# Patient Record
Sex: Male | Born: 1955 | Race: Black or African American | Hispanic: No | Marital: Married | Smoking: Never smoker
Health system: Southern US, Community
[De-identification: ages and names within clinical notes are randomized; demographics above are authoritative.]

## PROBLEM LIST (undated history)

## (undated) DIAGNOSIS — S43202A Unspecified subluxation of left sternoclavicular joint, initial encounter: Secondary | ICD-10-CM

## (undated) DIAGNOSIS — J189 Pneumonia, unspecified organism: Secondary | ICD-10-CM

## (undated) DIAGNOSIS — E119 Type 2 diabetes mellitus without complications: Secondary | ICD-10-CM

## (undated) HISTORY — DX: Unspecified subluxation of left sternoclavicular joint, initial encounter: S43.202A

## (undated) HISTORY — DX: Type 2 diabetes mellitus without complications: E11.9

## (undated) HISTORY — DX: Pneumonia, unspecified organism: J18.9

---

## 1998-06-17 ENCOUNTER — Encounter: Admission: RE | Admit: 1998-06-17 | Discharge: 1998-06-17 | Payer: Self-pay | Admitting: *Deleted
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
## 2011-02-17 ENCOUNTER — Emergency Department (HOSPITAL_COMMUNITY)
Admission: EM | Admit: 2011-02-17 | Discharge: 2011-02-18 | Disposition: A | Discharge status: Home / Self Care | Payer: No Typology Code available for payment source | Attending: Emergency Medicine | Admitting: Emergency Medicine

## 2011-02-17 ENCOUNTER — Emergency Department (HOSPITAL_COMMUNITY): Payer: No Typology Code available for payment source

## 2011-02-17 DIAGNOSIS — M542 Cervicalgia: Secondary | ICD-10-CM | POA: Insufficient documentation

## 2011-02-17 DIAGNOSIS — M25539 Pain in unspecified wrist: Secondary | ICD-10-CM | POA: Insufficient documentation

## 2011-02-17 DIAGNOSIS — IMO0002 Reserved for concepts with insufficient information to code with codable children: Secondary | ICD-10-CM | POA: Insufficient documentation

## 2011-02-17 DIAGNOSIS — S139XXA Sprain of joints and ligaments of unspecified parts of neck, initial encounter: Secondary | ICD-10-CM | POA: Insufficient documentation

## 2011-02-17 DIAGNOSIS — E119 Type 2 diabetes mellitus without complications: Secondary | ICD-10-CM | POA: Insufficient documentation

## 2011-02-18 ENCOUNTER — Emergency Department (HOSPITAL_COMMUNITY): Payer: No Typology Code available for payment source
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
## 2011-08-24 ENCOUNTER — Encounter: Payer: Self-pay | Admitting: Emergency Medicine

## 2011-08-24 ENCOUNTER — Emergency Department (HOSPITAL_COMMUNITY)
Admission: EM | Admit: 2011-08-24 | Discharge: 2011-08-24 | Disposition: A | Discharge status: Home / Self Care | Payer: No Typology Code available for payment source | Attending: Emergency Medicine | Admitting: Emergency Medicine

## 2011-08-24 ENCOUNTER — Emergency Department (HOSPITAL_COMMUNITY): Payer: No Typology Code available for payment source

## 2011-08-24 DIAGNOSIS — M549 Dorsalgia, unspecified: Secondary | ICD-10-CM

## 2011-08-24 DIAGNOSIS — M542 Cervicalgia: Secondary | ICD-10-CM

## 2011-08-24 DIAGNOSIS — M545 Low back pain, unspecified: Secondary | ICD-10-CM | POA: Insufficient documentation
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
## 2011-08-24 MED ORDER — HYDROMORPHONE HCL PF 2 MG/ML IJ SOLN
2.0000 mg | Freq: Once | INTRAMUSCULAR | Status: DC
  Filled 2011-08-24: qty 1

## 2011-08-24 MED ORDER — IBUPROFEN 800 MG PO TABS: 800.0000 mg | ORAL_TABLET | Freq: Three times a day (TID) | ORAL | Status: AC

## 2011-08-24 NOTE — ED Notes (Signed)
Ambulatory to check out desk stable and in no acute distress at time of discharge.
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
## 2011-08-24 NOTE — ED Notes (Signed)
MD at bedside. 
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
## 2011-08-26 NOTE — ED Provider Notes (Signed)
I saw and evaluated the patient, reviewed the resident's note and I agree with the findings and plan except as noted. C spine not cleared by Nexus criteria 2/2 midline ttp C 2/3. Neurovascularly intact. Imaging negative for fracture. Home with supportive care.   Forbes Cellar, MD 08/26/11 2250

## 2011-11-05 ENCOUNTER — Encounter: Payer: Self-pay | Admitting: Gastroenterology

## 2012-02-07 ENCOUNTER — Telehealth: Payer: Self-pay

## 2012-02-07 NOTE — Telephone Encounter (Signed)
Pt wants a copy of him immunization records call [phone] when ready

## 2012-02-08 NOTE — Telephone Encounter (Signed)
Printed out phone message due to patient only having a paper chart. °

## 2012-08-03 ENCOUNTER — Ambulatory Visit (INDEPENDENT_AMBULATORY_CARE_PROVIDER_SITE_OTHER): Payer: BC Managed Care – PPO | Admitting: Internal Medicine

## 2012-08-03 VITALS — BP 115/76 | HR 65 | Temp 97.6°F | Resp 18 | Ht 66.0 in | Wt 181.8 lb

## 2012-08-03 DIAGNOSIS — R7302 Impaired glucose tolerance (oral): Secondary | ICD-10-CM | POA: Insufficient documentation

## 2012-08-03 DIAGNOSIS — D72819 Decreased white blood cell count, unspecified: Secondary | ICD-10-CM | POA: Insufficient documentation

## 2012-08-03 DIAGNOSIS — Z0000 Encounter for general adult medical examination without abnormal findings: Secondary | ICD-10-CM

## 2012-08-03 DIAGNOSIS — R7309 Other abnormal glucose: Secondary | ICD-10-CM

## 2012-08-03 DIAGNOSIS — D696 Thrombocytopenia, unspecified: Secondary | ICD-10-CM | POA: Insufficient documentation

## 2012-08-03 LAB — COMPREHENSIVE METABOLIC PANEL
AST: 21 U/L (ref 0–37)
Albumin: 4.7 g/dL (ref 3.5–5.2)
BUN: 13 mg/dL (ref 6–23)
CO2: 26 mEq/L (ref 19–32)
Calcium: 9.3 mg/dL (ref 8.4–10.5)
Chloride: 106 mEq/L (ref 96–112)
Creat: 0.9 mg/dL (ref 0.50–1.35)
Glucose, Bld: 138 mg/dL — ABNORMAL HIGH (ref 70–99)
Potassium: 4.1 mEq/L (ref 3.5–5.3)

## 2012-08-03 LAB — POCT CBC
HCT, POC: 52 % (ref 43.5–53.7)
Hemoglobin: 16.1 g/dL (ref 14.1–18.1)
Lymph, poc: 1.7 (ref 0.6–3.4)
MCH, POC: 28.2 pg (ref 27–31.2)
MCHC: 31 g/dL — AB (ref 31.8–35.4)
MCV: 91 fL (ref 80–97)
POC MID %: 7.6 %M (ref 0–12)
RBC: 5.71 M/uL (ref 4.69–6.13)
WBC: 3.6 10*3/uL — AB (ref 4.6–10.2)

## 2012-08-03 LAB — POCT GLYCOSYLATED HEMOGLOBIN (HGB A1C): Hemoglobin A1C: 6.6

## 2012-08-03 LAB — LIPID PANEL
Cholesterol: 149 mg/dL (ref 0–200)
HDL: 41 mg/dL (ref >39)
Total CHOL/HDL Ratio: 3.6 Ratio

## 2012-08-03 LAB — PSA: PSA: 0.55 ng/mL (ref <=4.00)

## 2012-08-03 NOTE — Progress Notes (Signed)
  Subjective:    Patient ID: David Strong, male    DOB: 1956-01-20, 56 y.o.   MRN: [id number]  HPIannual exam Doing well Business successful Son in college Daughter married with child  Gained 10 lbs  tdap 04 Hep c neg 05 +anti HBs Colonoscopy missed due to work Review of Systems  Constitutional: Negative for fever, activity change, appetite change, fatigue and unexpected weight change.  HENT: Negative for hearing loss, trouble swallowing, neck pain, dental problem and voice change.   Eyes: Negative for photophobia and visual disturbance.  Respiratory: Negative for cough, shortness of breath and wheezing.   Cardiovascular: Negative for chest pain, palpitations and leg swelling.  Gastrointestinal: Negative for nausea, vomiting, abdominal pain, diarrhea and constipation.  Genitourinary: Negative for urgency, frequency, decreased urine volume and difficulty urinating.  Musculoskeletal: Negative for myalgias, back pain, joint swelling, arthralgias and gait problem.  Skin: Negative for rash.  Neurological: Negative for dizziness, weakness and headaches.  Hematological: Negative for adenopathy. Does not bruise/bleed easily.  Psychiatric/Behavioral: Negative for confusion, sleep disturbance, dysphoric mood and agitation.       Objective:   Physical Exam Stable VS Skin clear HEENT clear Heart regular without murmurs clicks rubs or gallops Lungs clear Abdomen soft nontender nondistended with no organomegaly or masses Prostate small symmetrical and soft Rectal negative with ihemosure also negative Spine straight/straight leg raise negative Extremities clear/full peripheral pulses Neurological intact Psychological Stable        Results for orders placed in visit on 08/03/12  POCT GLYCOSYLATED HEMOGLOBIN (HGB A1C)      Component Value Range   Hemoglobin A1C 6.6    POCT CBC      Component Value Range   WBC 3.6 (*) 4.6 - 10.2 K/uL   Lymph, poc 1.7  0.6 - 3.4   POC LYMPH  PERCENT 48.3  10 - 50 %L   MID (cbc) 0.3  0 - 0.9   POC MID % 7.6  0 - 12 %M   POC Granulocyte 1.6 (*) 2 - 6.9   Granulocyte percent 44.1  37 - 80 %G   RBC 5.71  4.69 - 6.13 M/uL   Hemoglobin 16.1  14.1 - 18.1 g/dL   HCT, POC 40.9  81.1 - 53.7 %   MCV 91.0  80 - 97 fL   MCH, POC 28.2  27 - 31.2 pg   MCHC 31.0 (*) 31.8 - 35.4 g/dL   RDW, POC 91.4     Platelet Count, POC 145  142 - 424 K/uL   MPV 9.1  0 - 99.8 fL  IFOBT (OCCULT BLOOD)      Component Value Range   IFOBT Negative      Assessment & Plan:  Annual exam 1. Glucose intolerance (impaired glucose tolerance) --hemoglobin A1c continues to be below 7 and he will continue with diet  2. Leucopenia -genetic   3. Thrombocytopenia -normal this year      Routine labs

## 2012-08-05 ENCOUNTER — Encounter: Payer: Self-pay | Admitting: Internal Medicine

## 2013-08-10 ENCOUNTER — Ambulatory Visit (INDEPENDENT_AMBULATORY_CARE_PROVIDER_SITE_OTHER): Payer: No Typology Code available for payment source | Admitting: Internal Medicine

## 2013-08-10 VITALS — BP 156/96 | HR 79 | Temp 98.6°F | Resp 17 | Ht 65.5 in | Wt 178.0 lb

## 2013-08-10 DIAGNOSIS — Z125 Encounter for screening for malignant neoplasm of prostate: Secondary | ICD-10-CM

## 2013-08-10 DIAGNOSIS — R7309 Other abnormal glucose: Secondary | ICD-10-CM

## 2013-08-10 DIAGNOSIS — Z0000 Encounter for general adult medical examination without abnormal findings: Secondary | ICD-10-CM

## 2013-08-10 DIAGNOSIS — R7302 Impaired glucose tolerance (oral): Secondary | ICD-10-CM

## 2013-08-10 LAB — CBC WITH DIFFERENTIAL/PLATELET
Basophils Relative: 0 % (ref 0–1)
Eosinophils Relative: 1 % (ref 0–5)
HCT: 44.3 % (ref 39.0–52.0)
Hemoglobin: 15.3 g/dL (ref 13.0–17.0)
Lymphocytes Relative: 48 % — ABNORMAL HIGH (ref 12–46)
MCHC: 34.5 g/dL (ref 30.0–36.0)
MCV: 83.7 fL (ref 78.0–100.0)
Monocytes Absolute: 0.3 10*3/uL (ref 0.1–1.0)
Monocytes Relative: 8 % (ref 3–12)
Neutro Abs: 1.6 10*3/uL — ABNORMAL LOW (ref 1.7–7.7)
RDW: 14 % (ref 11.5–15.5)

## 2013-08-10 LAB — LIPID PANEL
Cholesterol: 144 mg/dL (ref 0–200)
HDL: 42 mg/dL (ref >39)
Total CHOL/HDL Ratio: 3.4 Ratio
Triglycerides: 92 mg/dL (ref <150)

## 2013-08-10 LAB — COMPREHENSIVE METABOLIC PANEL
Albumin: 4.4 g/dL (ref 3.5–5.2)
BUN: 14 mg/dL (ref 6–23)
Calcium: 9 mg/dL (ref 8.4–10.5)
Chloride: 105 mEq/L (ref 96–112)
Creat: 0.97 mg/dL (ref 0.50–1.35)
Glucose, Bld: 161 mg/dL — ABNORMAL HIGH (ref 70–99)
Potassium: 4.3 mEq/L (ref 3.5–5.3)

## 2013-08-10 NOTE — Progress Notes (Addendum)
Subjective:    Patient ID: David Strong, male    DOB: [birth year], 57 y.o.   MRN: [id number] This chart was scribed for [name], MD by Nicholos Johns, Medical Scribe. This patient's care was started at 11:38 AM.  HPI  HPI Comments: David Strong is a 57 y.o. male who presents for an annual exam.  Pt is well overall. Will be visiting family for the holidays here in [place] relocated from [place].   Pt has not had colonoscopy. States he attempted to schedule a  non-evasive version of the test but was unsuccessful w/ their scheduling.  Pt states he is checking his sugar and trying to keep his weight down. Has lost 10+ lbs over the last year. 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 Son to grad this spring and take over  Patient Active Problem List   Diagnosis Date Noted  . Glucose intolerance (impaired glucose tolerance) 08/03/2012  . Leucopenia 08/03/2012  . Thrombocytopenia 08/03/2012  no meds Home BPs all wnl imm utd   Review of Systems  Constitutional: Negative for fever, activity change, appetite change, fatigue and unexpected weight change.  HENT: Negative for congestion, hearing loss, trouble swallowing and voice change.   Eyes: Negative for visual disturbance.  Respiratory: Negative for cough and shortness of breath.   Cardiovascular: Negative for chest pain, palpitations and leg swelling.  Gastrointestinal: Negative for nausea, abdominal pain and blood in stool.  Genitourinary: Negative for difficulty urinating.  Musculoskeletal: Negative for arthralgias, back pain, gait problem, joint swelling, myalgias and neck pain.  Skin: Negative for rash.  Allergic/Immunologic: Negative for environmental allergies and immunocompromised state.  Neurological: Negative for dizziness, weakness and headaches.  Hematological: Negative for adenopathy. Does not bruise/bleed easily.  Psychiatric/Behavioral: Negative for sleep disturbance and dysphoric mood.         Objective:   Physical Exam  Vitals reviewed. Constitutional: He is oriented to person, place, and time. He appears well-developed and well-nourished.  HENT:  Head: Normocephalic.  Right Ear: External ear normal.  Left Ear: External ear normal.  Nose: Nose normal.  Mouth/Throat: Oropharynx is clear and moist.  Tms and canals clear  Eyes: Conjunctivae and EOM are normal. Pupils are equal, round, and reactive to light.  Neck: Normal range of motion. Neck supple. No thyromegaly present.  Cardiovascular: Normal rate, regular rhythm, normal heart sounds and intact distal pulses.   No murmur heard. Pulmonary/Chest: Effort normal and breath sounds normal. No respiratory distress. He has no wheezes. He has no rales.  Abdominal: Soft. Bowel sounds are normal. He exhibits no distension and no mass. There is no tenderness. There is no rebound and no guarding.  No hepatosplenomegaly  Musculoskeletal: Normal range of motion. He exhibits no edema and no tenderness.  Lymphadenopathy:    He has no cervical adenopathy.  Neurological: He is alert and oriented to person, place, and time. He has normal reflexes. No cranial nerve deficit. He exhibits normal muscle tone. Coordination normal.  Skin: Skin is warm and dry. No rash noted.  Psychiatric: He has a normal mood and affect. His behavior is normal. Judgment and thought content normal.    Filed Vitals:   08/10/13 1122  BP: 156/96-----repeat in room 135/80  Pulse: 79  Temp: 98.6 F (37 C)  TempSrc: Oral  Resp: 17  Height: 5' 5.5" (1.664 m)  Weight: 178 lb (80.74 kg)  SpO2: 97%      Assessment & Plan:  I have completed the patient encounter in its  entirety as documented by the scribe, with editing by me where necessary. Robert P. Merla Riches, M.D.  CPE Imaired Glu Tol--A1C today 6.6%   sch colonoscopy

## 2013-08-18 ENCOUNTER — Encounter: Payer: Self-pay | Admitting: Internal Medicine

## 2014-09-18 ENCOUNTER — Ambulatory Visit (INDEPENDENT_AMBULATORY_CARE_PROVIDER_SITE_OTHER): Payer: 59 | Admitting: Internal Medicine

## 2014-09-18 VITALS — BP 116/70 | HR 64 | Temp 98.3°F | Ht 65.75 in | Wt 179.8 lb

## 2014-09-18 DIAGNOSIS — D72819 Decreased white blood cell count, unspecified: Secondary | ICD-10-CM

## 2014-09-18 DIAGNOSIS — Z0000 Encounter for general adult medical examination without abnormal findings: Secondary | ICD-10-CM

## 2014-09-18 DIAGNOSIS — Z125 Encounter for screening for malignant neoplasm of prostate: Secondary | ICD-10-CM

## 2014-09-18 DIAGNOSIS — R7302 Impaired glucose tolerance (oral): Secondary | ICD-10-CM

## 2014-09-18 DIAGNOSIS — D696 Thrombocytopenia, unspecified: Secondary | ICD-10-CM

## 2014-09-18 LAB — LIPID PANEL
CHOL/HDL RATIO: 4 ratio
CHOLESTEROL: 156 mg/dL (ref 0–200)
HDL: 39 mg/dL — ABNORMAL LOW (ref >39)
LDL CALC: 102 mg/dL — AB (ref 0–99)
TRIGLYCERIDES: 77 mg/dL (ref <150)
VLDL: 15 mg/dL (ref 0–40)

## 2014-09-18 LAB — CBC WITH DIFFERENTIAL/PLATELET
BASOS PCT: 0 % (ref 0–1)
Basophils Absolute: 0 10*3/uL (ref 0.0–0.1)
EOS PCT: 2 % (ref 0–5)
Eosinophils Absolute: 0.1 10*3/uL (ref 0.0–0.7)
HEMATOCRIT: 46 % (ref 39.0–52.0)
Hemoglobin: 15.6 g/dL (ref 13.0–17.0)
Lymphocytes Relative: 51 % — ABNORMAL HIGH (ref 12–46)
Lymphs Abs: 1.7 10*3/uL (ref 0.7–4.0)
MCH: 28.8 pg (ref 26.0–34.0)
MCHC: 33.9 g/dL (ref 30.0–36.0)
MCV: 85 fL (ref 78.0–100.0)
MONOS PCT: 7 % (ref 3–12)
MPV: 11 fL (ref 8.6–12.4)
Monocytes Absolute: 0.2 10*3/uL (ref 0.1–1.0)
NEUTROS PCT: 40 % — AB (ref 43–77)
Neutro Abs: 1.4 10*3/uL — ABNORMAL LOW (ref 1.7–7.7)
PLATELETS: 131 10*3/uL — AB (ref 150–400)
RBC: 5.41 MIL/uL (ref 4.22–5.81)
RDW: 13.5 % (ref 11.5–15.5)
WBC: 3.4 10*3/uL — AB (ref 4.0–10.5)

## 2014-09-18 LAB — COMPREHENSIVE METABOLIC PANEL
ALBUMIN: 4.5 g/dL (ref 3.5–5.2)
ALT: 19 U/L (ref 0–53)
AST: 19 U/L (ref 0–37)
Alkaline Phosphatase: 48 U/L (ref 39–117)
BILIRUBIN TOTAL: 0.8 mg/dL (ref 0.2–1.2)
BUN: 12 mg/dL (ref 6–23)
CO2: 28 mEq/L (ref 19–32)
CREATININE: 0.89 mg/dL (ref 0.50–1.35)
Calcium: 9.4 mg/dL (ref 8.4–10.5)
Chloride: 103 mEq/L (ref 96–112)
Glucose, Bld: 132 mg/dL — ABNORMAL HIGH (ref 70–99)
Potassium: 4.2 mEq/L (ref 3.5–5.3)
SODIUM: 139 meq/L (ref 135–145)
TOTAL PROTEIN: 6.8 g/dL (ref 6.0–8.3)

## 2014-09-18 LAB — POCT GLYCOSYLATED HEMOGLOBIN (HGB A1C): HEMOGLOBIN A1C: 7.5

## 2014-09-18 NOTE — Patient Instructions (Signed)
Look into these 2 diabetes medications Glipizide and Glucotrol. Also look at food choices and portions.   Wt Readings from Last 3 Encounters:  09/18/14 179 lb 12.8 oz (81.557 kg)  08/10/13 178 lb (80.74 kg)  08/03/12 181 lb 12.8 oz (82.464 kg)

## 2014-09-18 NOTE — Progress Notes (Signed)
Subjective:  This chart was scribed for David Siaobert Doolittle, MD by David Strong, ED Scribe at [facility].The patient was seen in exam room 02 and the patient's care was started at 1:39 PM.   Patient ID: David Strong, male    DOB: 10/09/1955, 59 y.o.   MRN: [id number] Chief Complaint  Patient presents with  . Annual Exam    Fasting   HPI  HPI Comments: David Strong Medinger is a 59 y.o. male with a history of DM who [facility] an annual [facility] exam. Pt's weight has been steady and his blood sugar levels have been variable. He states his blood sugar was elevated recently because of his diet-Gin over last 3 mos..  No problems with fatigue and no issues with sleep.  He has not had a colonoscopy. He is afraid of being put to sleep. Hep c neg 05 +anti HBs  Pt complains of some muscle pain and joint pains, he states this is most likely due to his work. Pt has his own cleaning business.   Immunization History  Administered Date(s) Administered  . Hepatitis A 07/25/2006  . Td 11/28/2005    Patient Active Problem List   Diagnosis Date Noted  . Glucose intolerance (impaired glucose tolerance)--after initially having a terrible skin reaction with metformin he discontinued medicines and was able to control this problem with diet for the last 3-4 years  08/03/2012  . Leucopenia 08/03/2012  . Thrombocytopenia 08/03/2012   Past Medical History  Diagnosis Date  . Diabetes mellitus without complication    No past surgical history on file. No Known Allergies Prior to Admission medications   Not on File  Nonsmoker  Review of Systems  Constitutional: Negative for fatigue.  Musculoskeletal: Positive for myalgias and arthralgias.  Psychiatric/Behavioral: Negative for sleep disturbance.  Remainder of the review of systems is negative     Objective:   Physical Exam  Constitutional: He is oriented to person, place, and time. He appears well-developed and  well-nourished. No distress.  HENT:  Head: Normocephalic and atraumatic.  Right Ear: External ear normal.  Left Ear: External ear normal.  Nose: Nose normal.  Mouth/Throat: Oropharynx is clear and moist.  Tms and canals clear  Eyes: Conjunctivae and EOM are normal. Pupils are equal, round, and reactive to light.  Neck: Normal range of motion. Neck supple. No thyromegaly present.  Cardiovascular: Normal rate, regular rhythm, normal heart sounds and intact distal pulses.   No murmur heard. Pulmonary/Chest: Effort normal and breath sounds normal. No respiratory distress. He has no wheezes. He has no rales.  Abdominal: Soft. Bowel sounds are normal. He exhibits no distension and no mass. There is no tenderness. There is no rebound and no guarding.  No hepatosplenomegaly  Musculoskeletal: Normal range of motion. He exhibits no edema or tenderness.  Lymphadenopathy:    He has no cervical adenopathy.  Neurological: He is alert and oriented to person, place, and time. He has normal reflexes. No cranial nerve deficit. He exhibits normal muscle tone. Coordination normal.  Skin: Skin is warm and dry. No rash noted.  Psychiatric: He has a normal mood and affect. His behavior is normal. Judgment and thought content normal.  Nursing note and vitals reviewed. BP 116/70 mmHg  Pulse 64  Temp(Src) 98.3 F (36.8 C) (Oral)  Ht 5' 5.75" (1.67 m)  Wt 179 lb 12.8 oz (81.557 kg)  BMI 29.24 kg/m2  SpO2 98% Results for orders placed or performed in visit  on 09/18/14  POCT glycosylated hemoglobin (Hb A1C)  Result Value Ref Range   Hemoglobin A1C 7.5          Assessment & Plan:  Glucose intolerance (impaired glucose tolerance) /diabetes mellitus type 2 without complications - Plan:  Comprehensive metabolic panel, Lipid panel  Allie went over stict diet at great length  Stop etoh  Increase exer and hope to lose 10 pounds  Leucopenia -with thrombocytopenia - Plan: CBC with  Differential/Platelet  Screening for prostate cancer - Plan: PSA  Annual physical exam -he continues to look very healthy from an exam standpoint  Screening for colon cancer   Plan: IFOBT POC (occult bld, rslt in office)      I have completed the patient encounter in its entirety as documented by the scribe, with editing by me where necessary. Robert P. Merla Riches, M.D.

## 2014-09-19 LAB — IFOBT (OCCULT BLOOD): IMMUNOLOGICAL FECAL OCCULT BLOOD TEST: NEGATIVE

## 2014-09-19 NOTE — Addendum Note (Signed)
Addended byLevon Hedger: Jailyne Chieffo A on: 09/19/2014 04:56 PM   Modules accepted: Orders

## 2014-09-20 LAB — PSA: PSA: 0.67 ng/mL (ref <=4.00)

## 2014-09-22 ENCOUNTER — Encounter: Payer: Self-pay | Admitting: Internal Medicine

## 2014-09-29 ENCOUNTER — Telehealth: Payer: Self-pay | Admitting: Radiology

## 2014-09-29 NOTE — Telephone Encounter (Signed)
We will mail him the info- i favor glimeperide/glipizide--he can let us know

## 2014-09-29 NOTE — Telephone Encounter (Signed)
Pt calling about lab results. He is also interested in taking the glimeperide and [place]. He wants more information about this.

## 2014-10-01 NOTE — Telephone Encounter (Signed)
Spoke with pt, advised message from Dr. Doolittle. Pt understood. 

## 2015-04-11 ENCOUNTER — Telehealth: Payer: Self-pay

## 2015-04-11 DIAGNOSIS — H538 Other visual disturbances: Secondary | ICD-10-CM

## 2015-04-11 DIAGNOSIS — R7302 Impaired glucose tolerance (oral): Secondary | ICD-10-CM

## 2015-04-11 NOTE — Telephone Encounter (Signed)
Hughes Better is calling from [facility] to request a referral for the patient. His appointment there is Thursday August 25th. The diagnoses code is H53.8 [facility] phone: [phone]

## 2015-04-11 NOTE — Telephone Encounter (Signed)
Okay for referral?

## 2015-04-11 NOTE — Telephone Encounter (Signed)
done

## 2015-04-14 LAB — HM DIABETES EYE EXAM

## 2015-06-07 ENCOUNTER — Ambulatory Visit (INDEPENDENT_AMBULATORY_CARE_PROVIDER_SITE_OTHER): Payer: 59 | Admitting: Internal Medicine

## 2015-06-07 VITALS — BP 106/68 | HR 108 | Temp 98.3°F | Resp 18 | Ht 67.0 in | Wt 181.0 lb

## 2015-06-07 DIAGNOSIS — K4090 Unilateral inguinal hernia, without obstruction or gangrene, not specified as recurrent: Secondary | ICD-10-CM

## 2015-06-07 DIAGNOSIS — E119 Type 2 diabetes mellitus without complications: Secondary | ICD-10-CM

## 2015-06-07 DIAGNOSIS — R7302 Impaired glucose tolerance (oral): Secondary | ICD-10-CM

## 2015-06-07 LAB — HEMOGLOBIN A1C: HEMOGLOBIN A1C: 8.2 % — AB (ref 4.0–6.0)

## 2015-06-07 LAB — POCT GLYCOSYLATED HEMOGLOBIN (HGB A1C): HEMOGLOBIN A1C: 8.2

## 2015-06-07 MED ORDER — GLIPIZIDE ER 5 MG PO TB24: 5.0000 mg | ORAL_TABLET | Freq: Every day | ORAL | Status: DC

## 2015-06-07 NOTE — Progress Notes (Signed)
Subjective:  This chart was scribed for David Sia, MD by Stann Ore, Medical Scribe. This patient was seen in Room 10 and the patient's care was started at 5:46 PM.     Patient ID: David Strong, male    DOB: [birth year], 59 y.o.   MRN: [id number] Chief Complaint  Patient presents with  . Mass    in groin area, right sided, x 1 month    HPI David Strong is a 59 y.o. male who has h/o DM contr by diet, presents to [facility] complaining of a mass in the right side of the groin noticed about a month ago. When he lays supine, the mass would fade out. He notices when he coughs, it reappears. He denies any pain in the area and testicular pain. He also has a mass on his chest, under his neck. He denies of any chest pain, shortness of breath, and pain in that area.  He has physical work.  Patient Active Problem List   Diagnosis Date Noted  . Glucose intolerance (impaired glucose tolerance)---side effects from metformin/trying for diet control doing well-last a1c 1/16 7.5 08/03/2012  . Leucopenia 08/03/2012  . Thrombocytopenia (HCC) 08/03/2012      Pt has his own cleaning business.  He's getting married again soon-young woman from [place] coming-known 39yrs-may start a 2nd family.   Patient Active Problem List   Diagnosis Date Noted  . Glucose intolerance (impaired glucose tolerance) 08/03/2012  . Leucopenia 08/03/2012  . Thrombocytopenia (HCC) 08/03/2012   No current outpatient prescriptions on file.   Review of Systems  Constitutional: Negative for fever, chills and fatigue.  Respiratory: Negative for cough, chest tightness and shortness of breath.   Cardiovascular: Negative for chest pain.  Gastrointestinal: Negative for abdominal pain.       Mass noted in groin  Genitourinary: Negative for penile swelling, scrotal swelling, penile pain and testicular pain.  Musculoskeletal: Negative for arthralgias.       Objective:   Physical Exam  Constitutional: He is oriented to  person, place, and time. He appears well-developed and well-nourished. No distress.  HENT:  Head: Normocephalic and atraumatic.  Eyes: EOM are normal. Pupils are equal, round, and reactive to light.  Neck: Neck supple.  Cardiovascular: Normal rate.   Pulmonary/Chest: Effort normal. No respiratory distress.  Abdominal:  Large soft bulge in inguinal area above ligament that leaves 3cm defect in abd wall  Musculoskeletal: Normal range of motion.  Neurological: He is alert and oriented to person, place, and time.  Skin: Skin is warm and dry.  Psychiatric: He has a normal mood and affect. His behavior is normal.  Nursing note and vitals reviewed.   BP 106/68 mmHg  Pulse 108  Temp(Src) 98.3 F (36.8 C)  Resp 18  Ht  (1.702 m)  Wt 181 lb (82.101 kg)  BMI 28.34 kg/m2  SpO2 98% Results for orders placed or performed in visit on 06/07/15  POCT glycosylated hemoglobin (Hb A1C)  Result Value Ref Range   Hemoglobin A1C 8.2         Assessment & Plan:  1) Glucose intolerance (impaired glucose tolerance) -  Type 2 diabetes mellitus without complication, without long-term current use of insulin (HCC) --We'll need to start medication other than metformin Meds ordered this encounter  Medications  . glipiZIDE (GLUCOTROL XL) 5 MG 24 hr tablet    Sig: Take 1 tablet (5 mg total) by mouth daily with breakfast.    Dispense:  90 tablet  Refill:  1  f/u 3 mos/contin wt loss efforts   2) Direct inguinal hernia--ref to surgery    By signing my name below, I, Stann Oresung-Kai Tsai, attest that this documentation has been prepared under the direction and in the presence of David Siaobert Daneli Butkiewicz, MD. Electronically Signed: Stann Oresung-Kai Tsai, Scribe. 06/07/2015 , 5:46 PM .  I have completed the patient encounter in its entirety as documented by the scribe, with editing by me where necessary. Kewon Statler P. Merla Richesoolittle, M.D.

## 2015-06-17 ENCOUNTER — Encounter: Payer: Self-pay | Admitting: Family Medicine

## 2015-06-21 ENCOUNTER — Telehealth: Payer: Self-pay

## 2015-06-21 NOTE — Telephone Encounter (Signed)
Any input to tell pt?

## 2015-06-21 NOTE — Telephone Encounter (Signed)
Patient is calling because he was last seen for a hernia and was sent to a specialist. Patient states that Dr. Merla Richesoolittle may have been contacted by the specialist and he would like to talk to Dr. Merla Richesoolittle to get more input. Please call patient!  [phone]

## 2015-06-22 NOTE — Telephone Encounter (Signed)
Tell him there is no way to know what caused the hernia--it could have been a week spot in the muscle or it could have been a tear from lifting something too heavy  The surgeon may offer another answer but i think it best to use regular insurance

## 2015-06-22 NOTE — Telephone Encounter (Signed)
Please advise 

## 2015-06-22 NOTE — Telephone Encounter (Signed)
Pt is filling out insurance paperwork concerning this visit. Dr. Merla Richesoolittle was wanting to know what may have caused this-he thinks it is his job work, he lifts a lot at his job. He wants to be on same page as Dr. Yong Channelolittle when he fills out his paperwork. CB #  Same as below

## 2015-06-23 NOTE — Telephone Encounter (Signed)
Unable  To reach Pt. Left VM to call back  Please see previous message

## 2015-06-23 NOTE — Telephone Encounter (Signed)
Spoke with Pt. Advised him of Dr. Netta Corriganoolittle's message. He said he would call back if he had any issues with his insurance company.

## 2015-07-02 ENCOUNTER — Telehealth: Payer: Self-pay

## 2015-07-02 NOTE — Telephone Encounter (Signed)
Pt called in and needs to discuss with Dr Merla Richesoolittle about his glipiZIDE (GLUCOTROL XL) 5 MG . He states is bs is low. He can be reached @ [phone], thank you

## 2015-07-04 NOTE — Telephone Encounter (Signed)
Should he continue the glipizide? He states he does not take the Metformin. He checked his blood sugar yesterday and it was at 104. I am confused on what pt is really wanting to do. It seems he does not want to take the Glipizide. Please advise.

## 2015-07-04 NOTE — Telephone Encounter (Signed)
We hope his BS in am is between 80 and 120 before he eats He is not on metformin He is prescribed glipizide 5mg  and needs this because A1C over 8 He can be on 2.5mg  glipizide if he has low blood sugars(lower than 80 at any time during the day) but 104 is great value

## 2015-07-04 NOTE — Telephone Encounter (Signed)
Spoke with pt, he states his sugar is 104 and he thinks this is too low.

## 2015-07-05 NOTE — Telephone Encounter (Signed)
Advised pt message from dr. Merla Richesoolittle. Pt understood.

## 2015-07-05 NOTE — Telephone Encounter (Signed)
Left message for pt to call back  °

## 2015-07-18 ENCOUNTER — Encounter: Payer: Self-pay | Admitting: Internal Medicine

## 2015-12-07 ENCOUNTER — Telehealth: Payer: Self-pay

## 2015-12-07 MED ORDER — METFORMIN HCL ER (MOD) 500 MG PO TB24: 500.0000 mg | ORAL_TABLET | Freq: Every day | ORAL | Status: DC

## 2015-12-07 NOTE — Telephone Encounter (Signed)
Ok to change back to metf 500er tho he was off this 2013-14-15 due to good diet Should come in for A1C in early July to see if this is good enough Meds ordered this encounter  Medications  . metFORMIN (GLUMETZA) 500 MG (MOD) 24 hr tablet    Sig: Take 1 tablet (500 mg total) by mouth daily with breakfast.    Dispense:  30 tablet    Refill:  2

## 2015-12-07 NOTE — Telephone Encounter (Signed)
Pt believes to be having side effect of the glipizide, states he just doesn't fill right wants to go back to the white pill  Best number [phone]

## 2015-12-08 NOTE — Telephone Encounter (Signed)
Pt advised.

## 2015-12-13 ENCOUNTER — Telehealth: Payer: Self-pay

## 2015-12-13 MED ORDER — METFORMIN HCL ER 500 MG PO TB24: 500.0000 mg | ORAL_TABLET | Freq: Every day | ORAL | Status: DC

## 2015-12-13 NOTE — Telephone Encounter (Signed)
Pharm sent notice that metformin er 500mg  (modified tabs) need a PA. This is a very expensive variety of metformin er and I don't see any mention in notes that pt needs this form. Dr Merla Richesoolittle just mentioned that he was putting pt back on metformin ER 500. It looks like Dr Merla Richesoolittle just chose the first ER tab that came up in EPIC and didn't realize it was a special form of tablet. I will resent the regular tabs of this med that is covered by ins. Dr Merla Richesoolittle, Lorain ChildesFYI.

## 2015-12-15 NOTE — Telephone Encounter (Signed)
Thanks

## 2016-01-17 ENCOUNTER — Ambulatory Visit (INDEPENDENT_AMBULATORY_CARE_PROVIDER_SITE_OTHER): Payer: BLUE CROSS/BLUE SHIELD | Admitting: Internal Medicine

## 2016-01-17 VITALS — BP 122/72 | HR 76 | Temp 98.1°F | Resp 17 | Ht 66.0 in | Wt 186.0 lb

## 2016-01-17 DIAGNOSIS — E119 Type 2 diabetes mellitus without complications: Secondary | ICD-10-CM | POA: Diagnosis not present

## 2016-01-17 LAB — POCT GLYCOSYLATED HEMOGLOBIN (HGB A1C): HEMOGLOBIN A1C: 7

## 2016-01-17 MED ORDER — GLUCOSE BLOOD VI STRP: ORAL_STRIP | Status: DC

## 2016-01-17 MED ORDER — GLIPIZIDE ER 5 MG PO TB24: 5.0000 mg | ORAL_TABLET | Freq: Every day | ORAL | Status: DC

## 2016-01-17 NOTE — Progress Notes (Signed)
Subjective:  By signing my name below, I, Stann Oresung-Kai Tsai, attest that this documentation has been prepared under the direction and in the presence of [name], MD. Electronically Signed: Stann Oresung-Kai Tsai, Scribe. 01/17/2016 , 5:51 PM .  Patient was seen in Room 3 .   Patient ID: David Strong, male    DOB: 01/07/1956, 60 y.o.   MRN: [id number] Chief Complaint  Patient presents with  . Medication Refill    glipizide    HPI David CavaSam Bragdon is a 60 y.o. male who [facility] medication refill of his glipizide. His blood sugar has been running well until 12/18/15. His machine ran out of "accu chek aviva plus" test strips and need a new order. When he goes to the pharmacy requesting new strips, they informed him needing a doctor's note for it.  He had to restart medication in the fall due to elevated A1c. 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 Patient Active Problem List   Diagnosis Date Noted  . Type 2 diabetes mellitus (HCC) 06/07/2015  . Glucose intolerance (impaired glucose tolerance) 08/03/2012  . Leucopenia 08/03/2012  . Thrombocytopenia (HCC) 08/03/2012    Current outpatient prescriptions:  .  glipiZIDE (GLUCOTROL) 10 MG tablet, Take 10 mg by mouth daily before breakfast., Disp: , Rfl:  Allergies  Allergen Reactions  . Codeine Itching  . Metformin And Related Rash   Review of Systems  Constitutional: Negative for fatigue and unexpected weight change.  Eyes: Negative for visual disturbance.  Respiratory: Negative for cough, chest tightness and shortness of breath.   Cardiovascular: Negative for chest pain, palpitations and leg swelling.  Gastrointestinal: Negative for abdominal pain and blood in stool.  Neurological: Negative for dizziness, light-headedness and headaches.         Objective:   Physical Exam  Constitutional: He is oriented to person, place, and time. He appears well-developed and well-nourished. No distress.  HENT:  Head: Normocephalic and atraumatic.  Eyes: EOM are normal. Pupils are equal, round, and reactive to light.  Neck: Neck supple.  Cardiovascular: Normal rate.   Pulmonary/Chest: Effort normal. No respiratory distress.  Musculoskeletal: Normal range of motion.  Neurological: He is alert and oriented to person, place, and time.  Skin: Skin is warm and dry.  Psychiatric: He has a normal mood and affect. His behavior is normal.  Nursing note and vitals reviewed.   BP 122/72 mmHg  Pulse 76  Temp(Src) 98.1 F (36.7 C) (Oral)  Resp 17  Ht 5\' 6"  (1.676 m)  Wt 186 lb (84.369 kg)  BMI 30.04 kg/m2  SpO2 99%   Results for orders placed or performed in visit on 01/17/16  POCT glycosylated hemoglobin (Hb A1C)  Result Value Ref Range   Hemoglobin A1C 7.0       Assessment & Plan:  Type 2 diabetes mellitus without complication, without long-term current use of insulin (HCC) - Plan: POCT glycosylated hemoglobin (Hb A1C) --looks like adequate control F/U 6 mos for CPE/full labs etc w/ Mannie StabileM Clark PA-C Meds ordered this encounter  Medications  . glucose blood (ACCU-CHEK AVIVA PLUS) test strip    Sig: Use as instructed    Dispense:  100 each    Refill:  12  . glipiZIDE (GLUCOTROL XL) 5 MG 24 hr tablet  Sig: Take 1 tablet (5 mg total) by mouth daily with breakfast.    Dispense:  90 tablet    Refill:  1   I have completed the patient encounter in its entirety as documented by the scribe, with editing by me where necessary. Robert P. Merla Riches, M.D.

## 2016-01-17 NOTE — Patient Instructions (Addendum)
     IF you received an x-ray today, you will receive an invoice from [facility]. Please contact [facility] at [phone] with questions or concerns regarding your invoice.   IF you received labwork today, you will receive an invoice from [facility]. Please contact [facility] at [phone] with questions or concerns regarding your invoice.   Our billing staff will not be able to assist you with questions regarding bills from these companies.  You will be contacted with the lab results as soon as they are available. The fastest way to get your results is to activate your My Chart account. Instructions are located on the last page of this paperwork. If you have not heard from us regarding the results in 2 weeks, please contact this office.     followup for physical in 6 mos--full bloodwork etc with PA-C Deliah BostonMichael Clark

## 2016-01-20 ENCOUNTER — Other Ambulatory Visit: Payer: Self-pay | Admitting: Emergency Medicine

## 2016-01-20 MED ORDER — GLUCOSE BLOOD VI STRP: ORAL_STRIP | Status: DC

## 2016-01-20 MED ORDER — GLIPIZIDE ER 5 MG PO TB24: 5.0000 mg | ORAL_TABLET | Freq: Every day | ORAL | Status: DC

## 2017-04-25 ENCOUNTER — Telehealth: Payer: Self-pay | Admitting: Family Medicine

## 2017-05-06 ENCOUNTER — Encounter: Payer: Self-pay | Admitting: Physician Assistant

## 2017-05-06 ENCOUNTER — Ambulatory Visit (INDEPENDENT_AMBULATORY_CARE_PROVIDER_SITE_OTHER): Payer: BLUE CROSS/BLUE SHIELD | Admitting: Physician Assistant

## 2017-05-06 VITALS — BP 134/96 | HR 65 | Temp 98.4°F | Resp 16 | Ht 65.75 in | Wt 187.8 lb

## 2017-05-06 DIAGNOSIS — Z23 Encounter for immunization: Secondary | ICD-10-CM | POA: Diagnosis not present

## 2017-05-06 DIAGNOSIS — E1165 Type 2 diabetes mellitus with hyperglycemia: Secondary | ICD-10-CM

## 2017-05-06 DIAGNOSIS — Z125 Encounter for screening for malignant neoplasm of prostate: Secondary | ICD-10-CM | POA: Diagnosis not present

## 2017-05-06 DIAGNOSIS — Z1329 Encounter for screening for other suspected endocrine disorder: Secondary | ICD-10-CM

## 2017-05-06 DIAGNOSIS — Z1211 Encounter for screening for malignant neoplasm of colon: Secondary | ICD-10-CM

## 2017-05-06 DIAGNOSIS — Z13228 Encounter for screening for other metabolic disorders: Secondary | ICD-10-CM

## 2017-05-06 DIAGNOSIS — Z1321 Encounter for screening for nutritional disorder: Secondary | ICD-10-CM | POA: Diagnosis not present

## 2017-05-06 DIAGNOSIS — Z130 Encounter for screening for diseases of the blood and blood-forming organs and certain disorders involving the immune mechanism: Secondary | ICD-10-CM

## 2017-05-06 DIAGNOSIS — Z0000 Encounter for general adult medical examination without abnormal findings: Secondary | ICD-10-CM | POA: Diagnosis not present

## 2017-05-06 MED ORDER — GLUCOSE BLOOD VI STRP: ORAL_STRIP | 3 refills | Status: DC

## 2017-05-06 MED ORDER — GLIPIZIDE ER 5 MG PO TB24: 5.0000 mg | ORAL_TABLET | Freq: Every day | ORAL | 3 refills | Status: DC

## 2017-05-06 NOTE — Progress Notes (Signed)
05/06/2017 8:41 AM   DOB: 01/14/1956 / MRN: [id number]  SUBJECTIVE:  David Strong is a 61 y.o. male presenting for annual exam. Behind on multiple screenings.  Has a history of diabetes and has been taking glipizide. Smoked a long time ago.  Takes glipizide for diabetes.  He works in Research scientist (medical) and tells me that he is constantly moving and on his feet.     He is allergic to codeine and metformin and related.   He  has a past medical history of Diabetes mellitus without complication (HCC).    He  reports that he has quit smoking. He has never used smokeless tobacco. He reports that he drinks alcohol. He reports that he does not use drugs. He  has no sexual activity history on file. The patient  has no past surgical history on file.  His family history is not on file.  Review of Systems  Constitutional: Negative for chills, diaphoresis and fever.  Eyes: Negative.   Respiratory: Negative for shortness of breath.   Cardiovascular: Negative for chest pain, orthopnea and leg swelling.  Gastrointestinal: Negative for abdominal pain, blood in stool, constipation, diarrhea, heartburn, melena, nausea and vomiting.  Genitourinary: Negative for flank pain.  Skin: Negative for rash.  Neurological: Negative for dizziness, sensory change, speech change, focal weakness and headaches.    The problem list and medications were reviewed and updated by myself where necessary and exist elsewhere in the encounter.   OBJECTIVE:  BP (!) 134/96 (BP Location: Right Arm, Patient Position: Sitting, Cuff Size: Large)   Pulse 65   Temp 98.4 F (36.9 C) (Oral)   Resp 16   Ht 5' 5.75" (1.67 m)   Wt 187 lb 12.8 oz (85.2 kg)   SpO2 96%   BMI 30.54 kg/m   Physical Exam  Constitutional: He is oriented to person, place, and time. He appears well-developed. He is active and cooperative.  Non-toxic appearance.  Eyes: Pupils are equal, round, and reactive to light. EOM are normal.  Cardiovascular:  Normal rate, regular rhythm, S1 normal, S2 normal, normal heart sounds, intact distal pulses and normal pulses.  Exam reveals no gallop and no friction rub.   No murmur heard. Pulmonary/Chest: Effort normal. No stridor. No tachypnea. No respiratory distress. He has no wheezes. He has no rales.  Abdominal: He exhibits no distension.  Musculoskeletal: He exhibits no edema.  Neurological: He is alert and oriented to person, place, and time. He has normal strength and normal reflexes. He is not disoriented. No cranial nerve deficit or sensory deficit. He exhibits normal muscle tone. Coordination and gait normal.  Skin: Skin is warm and dry. He is not diaphoretic. No pallor.  Psychiatric: His behavior is normal.  Vitals reviewed.   No results found for this or any previous visit (from the past 72 hour(s)).  No results found.  Lab Results  Component Value Date   PSA 0.67 09/18/2014   PSA 0.66 08/10/2013   PSA 0.55 08/03/2012     ASSESSMENT AND PLAN:  [name] was seen today for annual exam and medication refill.  Diagnoses and all orders for this visit:  Annual physical exam  Screening for endocrine, nutritional, metabolic and immunity disorder -     CBC -     Lipid panel -     TSH -     Microalbumin, urine -     Hemoglobin A1c -     HIV antibody -     Hepatitis C  antibody  Special screening for malignant neoplasms, colon -     Ambulatory referral to Gastroenterology -     Cologuard  Screening PSA (prostate specific antigen) -     PSA  Type 2 diabetes mellitus with hyperglycemia, without long-term current use of insulin (HCC): Needs several catch up recs. Will bring him back in in about 3 months to discuss these.  Will likely need statin, ACE, opth, and foot exam, pneuumo 23.   -     glipiZIDE (GLUCOTROL XL) 5 MG 24 hr tablet; Take 1 tablet (5 mg total) by mouth daily with breakfast.  Need for diphtheria-tetanus-pertussis (Tdap) vaccine -     Tdap vaccine greater than or equal  to 7yo IM    The patient is advised to call or return to clinic if he does not see an improvement in symptoms, or to seek the care of the closest emergency department if he worsens with the above plan.   Deliah Boston, MHS, PA-C Primary Care at [facility] 05/06/2017 8:41 AM

## 2017-05-07 LAB — LIPID PANEL
CHOLESTEROL TOTAL: 166 mg/dL (ref 100–199)
Chol/HDL Ratio: 3.6 ratio (ref 0.0–5.0)
HDL: 46 mg/dL (ref >39)
LDL Calculated: 96 mg/dL (ref 0–99)
TRIGLYCERIDES: 118 mg/dL (ref 0–149)
VLDL CHOLESTEROL CAL: 24 mg/dL (ref 5–40)

## 2017-05-07 LAB — CBC
HEMOGLOBIN: 15.2 g/dL (ref 13.0–17.7)
Hematocrit: 44.6 % (ref 37.5–51.0)
MCH: 28.9 pg (ref 26.6–33.0)
MCHC: 34.1 g/dL (ref 31.5–35.7)
MCV: 85 fL (ref 79–97)
PLATELETS: 104 10*3/uL — AB (ref 150–379)
RBC: 5.26 x10E6/uL (ref 4.14–5.80)
RDW: 14.4 % (ref 12.3–15.4)
WBC: 4.1 10*3/uL (ref 3.4–10.8)

## 2017-05-07 LAB — HEMOGLOBIN A1C
ESTIMATED AVERAGE GLUCOSE: 206 mg/dL
Hgb A1c MFr Bld: 8.8 % — ABNORMAL HIGH (ref 4.8–5.6)

## 2017-05-07 LAB — HIV ANTIBODY (ROUTINE TESTING W REFLEX): HIV Screen 4th Generation wRfx: NONREACTIVE

## 2017-05-07 LAB — HEPATITIS C ANTIBODY: Hep C Virus Ab: <0.1 s/co ratio (ref 0.0–0.9)

## 2017-05-07 LAB — MICROALBUMIN, URINE

## 2017-05-07 LAB — TSH: TSH: 3.11 u[IU]/mL (ref 0.450–4.500)

## 2017-05-07 LAB — PSA: PROSTATE SPECIFIC AG, SERUM: 0.8 ng/mL (ref 0.0–4.0)

## 2017-05-09 ENCOUNTER — Telehealth: Payer: Self-pay | Admitting: Physician Assistant

## 2017-05-09 NOTE — Telephone Encounter (Signed)
PATIENT STATES HE HAD HIS PHYSICAL DONE WITH MICHAEL ON Monday (05/06/17). MICHAEL MENTIONED TO HIM THAT HE DID NOT SEE WHEN HE HAD HIS LAST COLONOSCOPY. MR. 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 HE WANTS TO KNOW IF MICHAEL WOULD LIKE HIM TO DROP BY HERE SO WE CAN MAKE A COPY OF IT FOR HIS RECORDS? HE SAID HE HAS ALREADY RECEIVED THE PACKET FROM [facility] AND HE WILL BE COLLECTING HIS SAMPLE SOON. THEN HE WILL MAIL IT BACK IN. BEST PHONE [phone] (CELL) MBC

## 2017-05-13 LAB — COLOGUARD: COLOGUARD: NEGATIVE

## 2017-05-13 NOTE — Telephone Encounter (Signed)
Lm message records were updated in patient chart

## 2017-05-18 LAB — COLOGUARD: Cologuard: NEGATIVE

## 2017-05-27 NOTE — Progress Notes (Signed)
Please make patient aware of results via letter. Diabetes acutely worse and will be seeing him back in about 2 months for a recheck of that. Deliah Boston PA-C, 05/27/2017 3:27 PM

## 2017-07-09 ENCOUNTER — Telehealth: Payer: Self-pay | Admitting: Physician Assistant

## 2017-07-09 NOTE — Telephone Encounter (Signed)
Advised pt that taking glipizide when he eats breakfast after getting off work would be ok. Pt states he works from [employer] until approximately 1-3am in the morning and is usually sleep around 8am. Explained to the pt that taking the medication around the same time each day and making sure he taking the medication with food was important and due to his current work schedule this would be the best option for him at this time. Pt verbalized understanding, no further concerns voiced at this time.

## 2017-07-09 NOTE — Telephone Encounter (Signed)
Copied from CRM [id number]#[id number]. Topic: Inquiry >> Jul 09, 2017  2:21 PM Alexander BergeronBarksdale, David B wrote: Reason for CRM: PT called and stated about his diabetes medicine, he is concerned about his time of intake of medicine due to his work schedule. He wants to know if it is alright to take this medication at 2 or 3 am which is the hour that he usually comes home and this is the time frame he can take it.

## 2017-09-16 ENCOUNTER — Telehealth: Payer: Self-pay

## 2017-09-16 NOTE — Telephone Encounter (Signed)
Received message that pt has questions regarding testing that was ordered and sent out.  Pt no longer on hold.  L/m for pt to call back to review questions.

## 2017-10-25 ENCOUNTER — Ambulatory Visit: Payer: BLUE CROSS/BLUE SHIELD | Admitting: Physician Assistant

## 2017-10-25 ENCOUNTER — Encounter: Payer: Self-pay | Admitting: Physician Assistant

## 2017-10-25 VITALS — BP 132/83 | HR 74 | Temp 97.4°F | Resp 18 | Ht 65.75 in | Wt 182.6 lb

## 2017-10-25 DIAGNOSIS — E119 Type 2 diabetes mellitus without complications: Secondary | ICD-10-CM | POA: Diagnosis not present

## 2017-10-25 DIAGNOSIS — M5442 Lumbago with sciatica, left side: Secondary | ICD-10-CM

## 2017-10-25 DIAGNOSIS — M5441 Lumbago with sciatica, right side: Secondary | ICD-10-CM

## 2017-10-25 DIAGNOSIS — G8929 Other chronic pain: Secondary | ICD-10-CM | POA: Diagnosis not present

## 2017-10-25 LAB — GLUCOSE, POCT (MANUAL RESULT ENTRY): POC Glucose: 158 mg/dl — AB (ref 70–99)

## 2017-10-25 LAB — POCT GLYCOSYLATED HEMOGLOBIN (HGB A1C): Hemoglobin A1C: 8.4

## 2017-10-25 MED ORDER — MELOXICAM 15 MG PO TABS: 15.0000 mg | ORAL_TABLET | Freq: Every day | ORAL | 0 refills | Status: DC

## 2017-10-25 NOTE — Progress Notes (Signed)
10/25/2017 5:57 PM   DOB: 12/11/1955 / MRN: [id number]  SUBJECTIVE:  David Strong is a 62 y.o. male presenting for recheck of diabetes.  Despite my advice to take his medication every day at his last visit he continues to take his medication a few times a week.  He is very worried about side effects.  When I ask him about side effects from diabetes he tells me his kidney failure and eye problems.  He denies stocking glove paresthesia, acute vision changes, polyuria and polydipsia.  He does complain of low back pain is been present for about 1 month now.  He says that this radiates to his posterior bilateral thighs.  He denies weakness and incontinence.  He complains about a whooshing sound in his left ear that comes and goes.  This started after he placed a Q-tip in the ear and all of a sudden began to have that symptom.  He wonders if he is damaged his ear.  He is allergic to codeine and metformin and related.   He  has a past medical history of Diabetes mellitus without complication (HCC).    He  reports that he has quit smoking. he has never used smokeless tobacco. He reports that he drinks alcohol. He reports that he does not use drugs. He  has no sexual activity history on file. The patient  has no past surgical history on file.  His family history is not on file.  Review of Systems  Constitutional: Negative for chills, diaphoresis and fever.  Respiratory: Negative for cough, hemoptysis, sputum production, shortness of breath and wheezing.   Cardiovascular: Negative for chest pain, orthopnea and leg swelling.  Gastrointestinal: Negative for nausea.  Skin: Negative for rash.  Neurological: Negative for dizziness.    The problem list and medications were reviewed and updated by myself where necessary and exist elsewhere in the encounter.   OBJECTIVE:  BP 132/83   Pulse 74   Temp (!) 97.4 F (36.3 C) (Oral)   Resp 18   Ht 5' 5.75" (1.67 m)   Wt 182 lb 9.6 oz (82.8 kg)   SpO2  96%   BMI 29.70 kg/m   Physical Exam  Vitals reviewed and normal.  Cardiac auscultation negative for murmurs, rubs, S3 and S4.  Breath sounds normal bilaterally.  TMs pearly gray bilaterally with good cone of light.  Negative for lymphadenopathy about the head neck.  Neck supple.  Spine negative for step-off deformity.  Negative for lumbar paraspinal tenderness.  He does have tenderness bilaterally about the sciatic notches.  Gait is normal.  Sensation intact to light touch.  Reflexes intact in the lower extremities bilaterally.  Results for orders placed or performed in visit on 10/25/17 (from the past 72 hour(s))  POCT glucose (manual entry)     Status: Abnormal   Collection Time: 10/25/17  5:17 PM  Result Value Ref Range   POC Glucose 158 (A) 70 - 99 mg/dl  POCT glycosylated hemoglobin (Hb A1C)     Status: None   Collection Time: 10/25/17  5:20 PM  Result Value Ref Range   Hemoglobin A1C 8.4    Lab Results  Component Value Date   CREATININE 0.89 09/18/2014   BUN 12 09/18/2014   NA 139 09/18/2014   K 4.2 09/18/2014   CL 103 09/18/2014   CO2 28 09/18/2014    No results found.  ASSESSMENT AND PLAN:  Doreatha MartinSam was seen today for diabetes and body pain.  Diagnoses  and all orders for this visit:  Type 2 diabetes mellitus without complication, without long-term current use of insulin (HCC) Comments: Uncontrolled.  I have tried to counsel him on the importance of medication compliance.  Advised that I will recheck his A1c in the office in 1 month and if his  Orders: -     HM Diabetes Foot Exam -     POCT glycosylated hemoglobin (Hb A1C) -     POCT glucose (manual entry) -     Comprehensive metabolic panel -     Lipid panel -     Cancel: Microalbumin, urine  Chronic low back pain with bilateral sciatica, unspecified back pain laterality -     meloxicam (MOBIC) 15 MG tablet; Take 1 tablet (15 mg total) by mouth daily.  Other orders -     Cancel: Pneumococcal polysaccharide  vaccine 23-valent greater than or equal to 2yo subcutaneous/IM    The patient is advised to call or return to clinic if he does not see an improvement in symptoms, or to seek the care of the closest emergency department if he worsens with the above plan.   Deliah Boston, MHS, PA-C Primary Care at [facility] 10/25/2017 5:57 PM

## 2017-10-25 NOTE — Patient Instructions (Addendum)
Come back in one month for a diabetes recheck.  If your a1c is no better than we need to start you an additional diabetes med.   Take the anti-inflammatory for back pain. Eat when you take it.   Diabetes Mellitus and Nutrition When you have diabetes (diabetes mellitus), it is very important to have healthy eating habits because your blood sugar (glucose) levels are greatly affected by what you eat and drink. Eating healthy foods in the appropriate amounts, at about the same times every day, can help you:  Control your blood glucose.  Lower your risk of heart disease.  Improve your blood pressure.  Reach or maintain a healthy weight.  Every person with diabetes is different, and each person has different needs for a meal plan. Your health care provider may recommend that you work with a diet and nutrition specialist (dietitian) to make a meal plan that is best for you. Your meal plan may vary depending on factors such as:  The calories you need.  The medicines you take.  Your weight.  Your blood glucose, blood pressure, and cholesterol levels.  Your activity level.  Other health conditions you have, such as heart or kidney disease.  How do carbohydrates affect me? Carbohydrates affect your blood glucose level more than any other type of food. Eating carbohydrates naturally increases the amount of glucose in your blood. Carbohydrate counting is a method for keeping track of how many carbohydrates you eat. Counting carbohydrates is important to keep your blood glucose at a healthy level, especially if you use insulin or take certain oral diabetes medicines. It is important to know how many carbohydrates you can safely have in each meal. This is different for every person. Your dietitian can help you calculate how many carbohydrates you should have at each meal and for snack. Foods that contain carbohydrates include:  Bread, cereal, rice, pasta, and crackers.  Potatoes and  corn.  Peas, beans, and lentils.  Milk and yogurt.  Fruit and juice.  Desserts, such as cakes, cookies, ice cream, and candy.  How does alcohol affect me? Alcohol can cause a sudden decrease in blood glucose (hypoglycemia), especially if you use insulin or take certain oral diabetes medicines. Hypoglycemia can be a life-threatening condition. Symptoms of hypoglycemia (sleepiness, dizziness, and confusion) are similar to symptoms of having too much alcohol. If your health care provider says that alcohol is safe for you, follow these guidelines:  Limit alcohol intake to no more than 1 drink per day for nonpregnant women and 2 drinks per day for men. One drink equals 12 oz of beer, 5 oz of wine, or 1 oz of hard liquor.  Do not drink on an empty stomach.  Keep yourself hydrated with water, diet soda, or unsweetened iced tea.  Keep in mind that regular soda, juice, and other mixers may contain a lot of sugar and must be counted as carbohydrates.  What are tips for following this plan? Reading food labels  Start by checking the serving size on the label. The amount of calories, carbohydrates, fats, and other nutrients listed on the label are based on one serving of the food. Many foods contain more than one serving per package.  Check the total grams (g) of carbohydrates in one serving. You can calculate the number of servings of carbohydrates in one serving by dividing the total carbohydrates by 15. For example, if a food has 30 g of total carbohydrates, it would be equal to 2 servings of  carbohydrates.  Check the number of grams (g) of saturated and trans fats in one serving. Choose foods that have low or no amount of these fats.  Check the number of milligrams (mg) of sodium in one serving. Most people should limit total sodium intake to less than 2,300 mg per day.  Always check the nutrition information of foods labeled as "low-fat" or "nonfat". These foods may be higher in added  sugar or refined carbohydrates and should be avoided.  Talk to your dietitian to identify your daily goals for nutrients listed on the label. Shopping  Avoid buying canned, premade, or processed foods. These foods tend to be high in fat, sodium, and added sugar.  Shop around the outside edge of the grocery store. This includes fresh fruits and vegetables, bulk grains, fresh meats, and fresh dairy. Cooking  Use low-heat cooking methods, such as baking, instead of high-heat cooking methods like deep frying.  Cook using healthy oils, such as olive, canola, or sunflower oil.  Avoid cooking with butter, cream, or high-fat meats. Meal planning  Eat meals and snacks regularly, preferably at the same times every day. Avoid going long periods of time without eating.  Eat foods high in fiber, such as fresh fruits, vegetables, beans, and whole grains. Talk to your dietitian about how many servings of carbohydrates you can eat at each meal.  Eat 4-6 ounces of lean protein each day, such as lean meat, chicken, fish, eggs, or tofu. 1 ounce is equal to 1 ounce of meat, chicken, or fish, 1 egg, or 1/4 cup of tofu.  Eat some foods each day that contain healthy fats, such as avocado, nuts, seeds, and fish. Lifestyle   Check your blood glucose regularly.  Exercise at least 30 minutes 5 or more days each week, or as told by your health care provider.  Take medicines as told by your health care provider.  Do not use any products that contain nicotine or tobacco, such as cigarettes and e-cigarettes. If you need help quitting, ask your health care provider.  Work with a Veterinary surgeon or diabetes educator to identify strategies to manage stress and any emotional and social challenges. What are some questions to ask my health care provider?  Do I need to meet with a diabetes educator?  Do I need to meet with a dietitian?  What number can I call if I have questions?  When are the best times to check my  blood glucose? Where to find more information:  American Diabetes Association: diabetes.org/food-and-fitness/food  Academy of Nutrition and Dietetics: https://www.vargas.com/  [employer] of Diabetes and Digestive and Kidney Diseases (NIH): FindJewelers.cz Summary  A healthy meal plan will help you control your blood glucose and maintain a healthy lifestyle.  Working with a diet and nutrition specialist (dietitian) can help you make a meal plan that is best for you.  Keep in mind that carbohydrates and alcohol have immediate effects on your blood glucose levels. It is important to count carbohydrates and to use alcohol carefully. This information is not intended to replace advice given to you by your health care provider. Make sure you discuss any questions you have with your health care provider. Document Released: 05/03/2005 Document Revised: 09/10/2016 Document Reviewed: 09/10/2016 Elsevier Interactive Patient Education  2018 [employer].     IF you received an x-ray today, you will receive an invoice from [facility]. Please contact [facility] at [phone] with questions or concerns regarding your invoice.   IF you received  labwork today, you will receive an invoice from [facility]. Please contact [facility] at [phone] with questions or concerns regarding your invoice.   Our billing staff will not be able to assist you with questions regarding bills from these companies.  You will be contacted with the lab results as soon as they are available. The fastest way to get your results is to activate your My Chart account. Instructions are located on the last page of this paperwork. If you have not heard from [place] regarding the results in 2 weeks, please contact this office.

## 2017-10-26 LAB — LIPID PANEL
CHOLESTEROL TOTAL: 172 mg/dL (ref 100–199)
Chol/HDL Ratio: 3.5 ratio (ref 0.0–5.0)
HDL: 49 mg/dL (ref >39)
LDL Calculated: 99 mg/dL (ref 0–99)
Triglycerides: 122 mg/dL (ref 0–149)
VLDL Cholesterol Cal: 24 mg/dL (ref 5–40)

## 2017-10-26 LAB — COMPREHENSIVE METABOLIC PANEL
ALBUMIN: 4.7 g/dL (ref 3.6–4.8)
ALK PHOS: 62 IU/L (ref 39–117)
ALT: 24 IU/L (ref 0–44)
AST: 23 IU/L (ref 0–40)
Albumin/Globulin Ratio: 2 (ref 1.2–2.2)
BILIRUBIN TOTAL: 0.6 mg/dL (ref 0.0–1.2)
BUN / CREAT RATIO: 13 (ref 10–24)
BUN: 14 mg/dL (ref 8–27)
CHLORIDE: 104 mmol/L (ref 96–106)
CO2: 24 mmol/L (ref 20–29)
Calcium: 9.5 mg/dL (ref 8.6–10.2)
Creatinine, Ser: 1.07 mg/dL (ref 0.76–1.27)
GFR calc Af Amer: 86 mL/min/{1.73_m2} (ref >59)
GFR calc non Af Amer: 75 mL/min/{1.73_m2} (ref >59)
GLOBULIN, TOTAL: 2.3 g/dL (ref 1.5–4.5)
GLUCOSE: 162 mg/dL — AB (ref 65–99)
Potassium: 4.1 mmol/L (ref 3.5–5.2)
SODIUM: 143 mmol/L (ref 134–144)
Total Protein: 7 g/dL (ref 6.0–8.5)

## 2017-11-13 ENCOUNTER — Telehealth: Payer: Self-pay | Admitting: Physician Assistant

## 2017-11-13 NOTE — Telephone Encounter (Signed)
Provider, please advise. Would you like him to come back for another appointment?

## 2017-11-13 NOTE — Telephone Encounter (Signed)
Copied from CRM [id number]. Topic: Quick Communication - See Telephone Encounter >> Nov 13, 2017  4:30 PM Floria RavelingStovall, Shana A wrote: CRM for notification. See Telephone encounter for: 11/13/17.  Pt called in and said that he took the 10 meloxicam (MOBIC) 15 MG tablet [[id number].  He said that it did not help at all.  He would like to know other options?  Please advise  Best number [phone]

## 2017-11-13 NOTE — Telephone Encounter (Signed)
He can add in Tylenol 1000 mg every eight. I can also refer to orthopedics.

## 2017-11-14 ENCOUNTER — Other Ambulatory Visit: Payer: Self-pay

## 2017-11-14 DIAGNOSIS — G8929 Other chronic pain: Secondary | ICD-10-CM

## 2017-11-14 DIAGNOSIS — M5442 Lumbago with sciatica, left side: Principal | ICD-10-CM

## 2017-11-14 DIAGNOSIS — M5441 Lumbago with sciatica, right side: Principal | ICD-10-CM

## 2017-11-14 MED ORDER — MELOXICAM 15 MG PO TABS: 15.0000 mg | ORAL_TABLET | Freq: Every day | ORAL | 0 refills | Status: DC

## 2017-11-14 NOTE — Telephone Encounter (Signed)
Medication reordered. Please schedule follow-up visit within 2 weeks for diabetes management.

## 2017-11-14 NOTE — Telephone Encounter (Signed)
Pt states he is out of mobic and still in pain. Would you like to reorder. Pt already advised to add tylenol to regimen.

## 2017-11-14 NOTE — Telephone Encounter (Signed)
Please refill for fifteen days of therapy. I need to see him back for a diabetes medication check before that prescription runs out.

## 2017-11-15 ENCOUNTER — Telehealth: Payer: Self-pay | Admitting: Physician Assistant

## 2017-11-15 NOTE — Telephone Encounter (Signed)
Called pt to let him know that David Strong thinks he should see him again. Made an appt for Monday 4/4 at 11:00 am with David Sporelark

## 2017-11-15 NOTE — Telephone Encounter (Signed)
Please schedule an appt for this patient per David Strong. Pt would like something different for pain.

## 2017-11-15 NOTE — Telephone Encounter (Signed)
Pt message re: something other than mobic for pain sent to Deliah BostonMichael Clark

## 2017-11-15 NOTE — Telephone Encounter (Signed)
That med is as good as any other nsaid.  Sounds like I should see him back.  Deliah BostonMichael Clark, MS, PA-C 1:34 PM, 11/15/2017

## 2017-11-15 NOTE — Telephone Encounter (Signed)
Patient thought that something else was going to be called in, mobic does not work for him. Please advise. Call back [phone]

## 2017-11-18 ENCOUNTER — Ambulatory Visit (INDEPENDENT_AMBULATORY_CARE_PROVIDER_SITE_OTHER): Payer: BLUE CROSS/BLUE SHIELD

## 2017-11-18 ENCOUNTER — Ambulatory Visit: Payer: BLUE CROSS/BLUE SHIELD | Admitting: Physician Assistant

## 2017-11-18 ENCOUNTER — Encounter: Payer: Self-pay | Admitting: Physician Assistant

## 2017-11-18 ENCOUNTER — Other Ambulatory Visit: Payer: Self-pay

## 2017-11-18 VITALS — BP 136/88 | HR 70 | Temp 98.5°F | Resp 16 | Ht 65.75 in | Wt 185.2 lb

## 2017-11-18 DIAGNOSIS — M545 Low back pain, unspecified: Secondary | ICD-10-CM

## 2017-11-18 DIAGNOSIS — G8929 Other chronic pain: Secondary | ICD-10-CM

## 2017-11-18 DIAGNOSIS — M4316 Spondylolisthesis, lumbar region: Secondary | ICD-10-CM | POA: Diagnosis not present

## 2017-11-18 DIAGNOSIS — E1165 Type 2 diabetes mellitus with hyperglycemia: Secondary | ICD-10-CM | POA: Diagnosis not present

## 2017-11-18 DIAGNOSIS — D696 Thrombocytopenia, unspecified: Secondary | ICD-10-CM

## 2017-11-18 LAB — POCT CBC
GRANULOCYTE PERCENT: 44 % (ref 37–80)
HEMATOCRIT: 44.4 % (ref 43.5–53.7)
Hemoglobin: 14.8 g/dL (ref 14.1–18.1)
Lymph, poc: 1.9 (ref 0.6–3.4)
MCH: 29.3 pg (ref 27–31.2)
MCHC: 33.4 g/dL (ref 31.8–35.4)
MCV: 87.8 fL (ref 80–97)
MID (CBC): 0.2 (ref 0–0.9)
MPV: 7.4 fL (ref 0–99.8)
PLATELET COUNT, POC: 125 10*3/uL — AB (ref 142–424)
POC GRANULOCYTE: 1.7 — AB (ref 2–6.9)
POC LYMPH %: 49.9 % (ref 10–50)
POC MID %: 6.4 % (ref 0–12)
RBC: 5.05 M/uL (ref 4.69–6.13)
RDW, POC: 14.3 %
WBC: 3.8 10*3/uL — AB (ref 4.6–10.2)

## 2017-11-18 LAB — POCT GLYCOSYLATED HEMOGLOBIN (HGB A1C): HEMOGLOBIN A1C: 8.1

## 2017-11-18 IMAGING — DX DG LUMBAR SPINE COMPLETE 4+V
5 series · 5 of 5 positions shown · non-contrast
Comparison: [DATE]

CLINICAL DATA: Chronic back pain.

EXAM:
LUMBAR SPINE - COMPLETE 4+ VIEW

[l-spine ap]
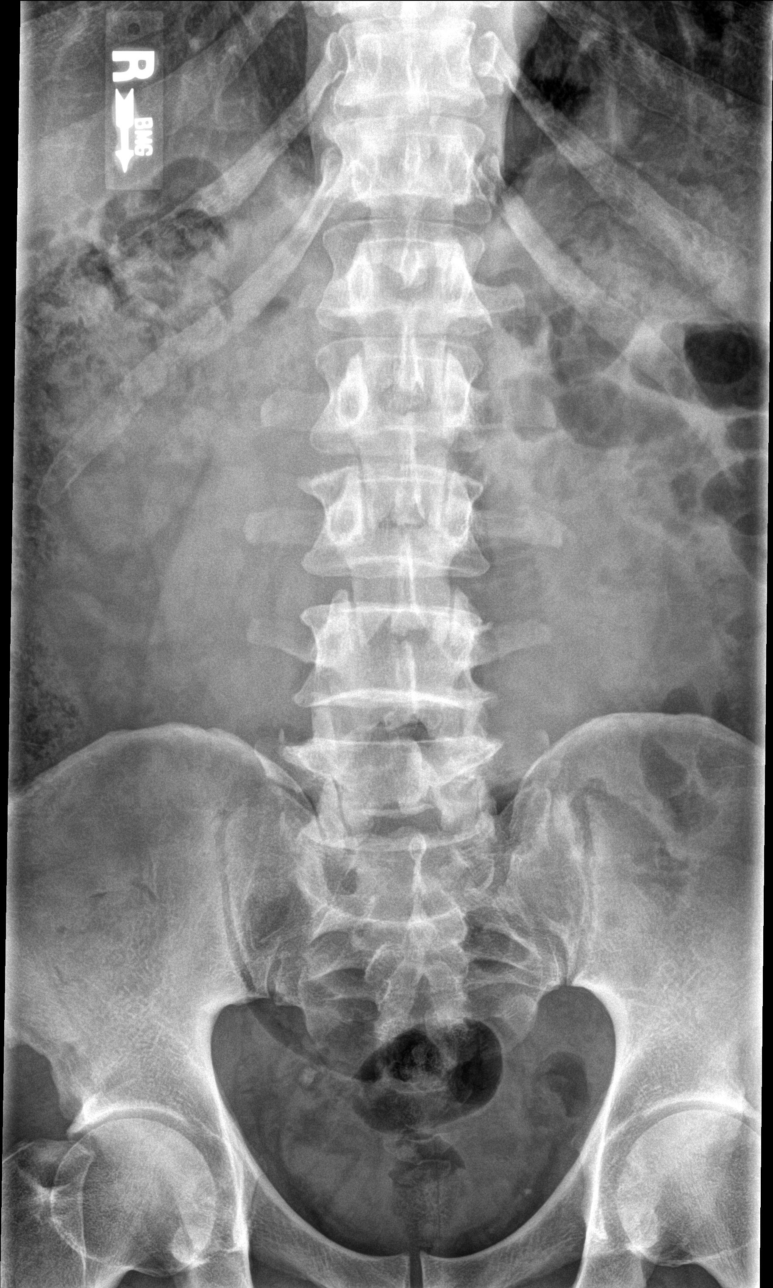

[l-spine obl (1 of 2)]
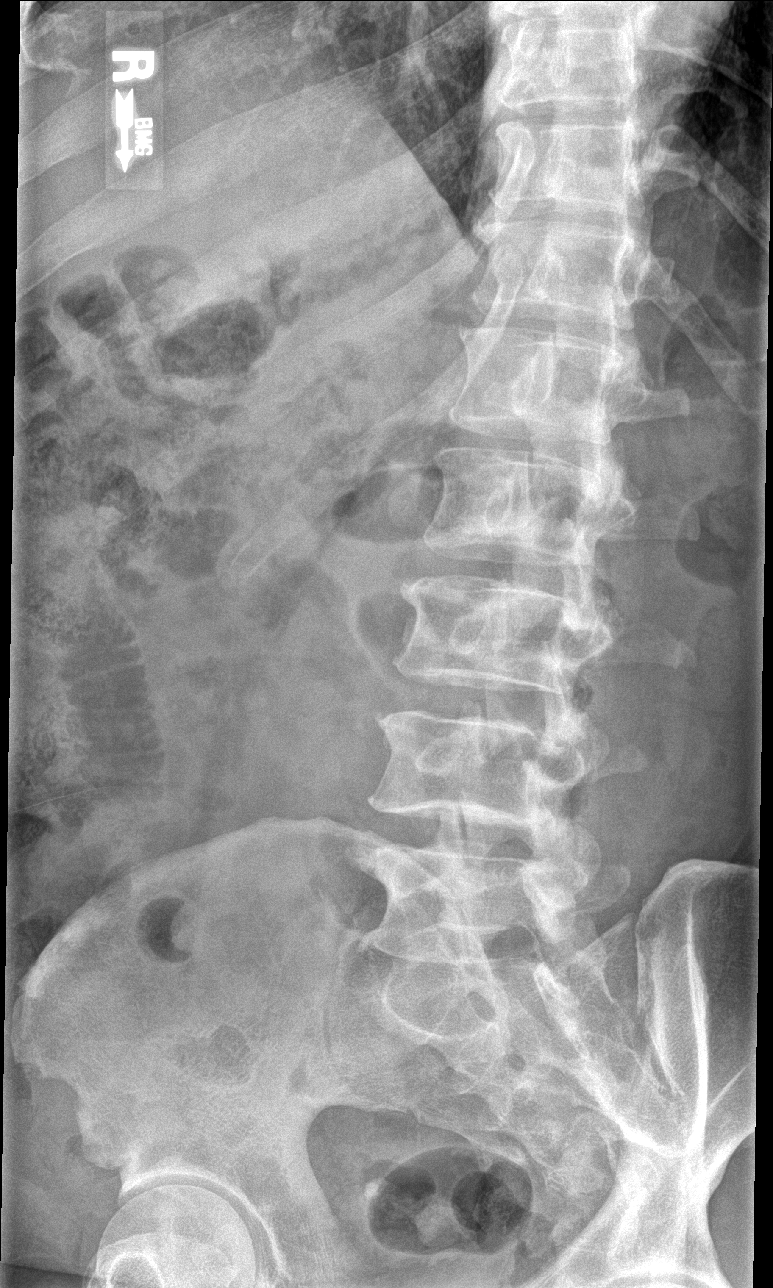

[l-spine obl (2 of 2)]
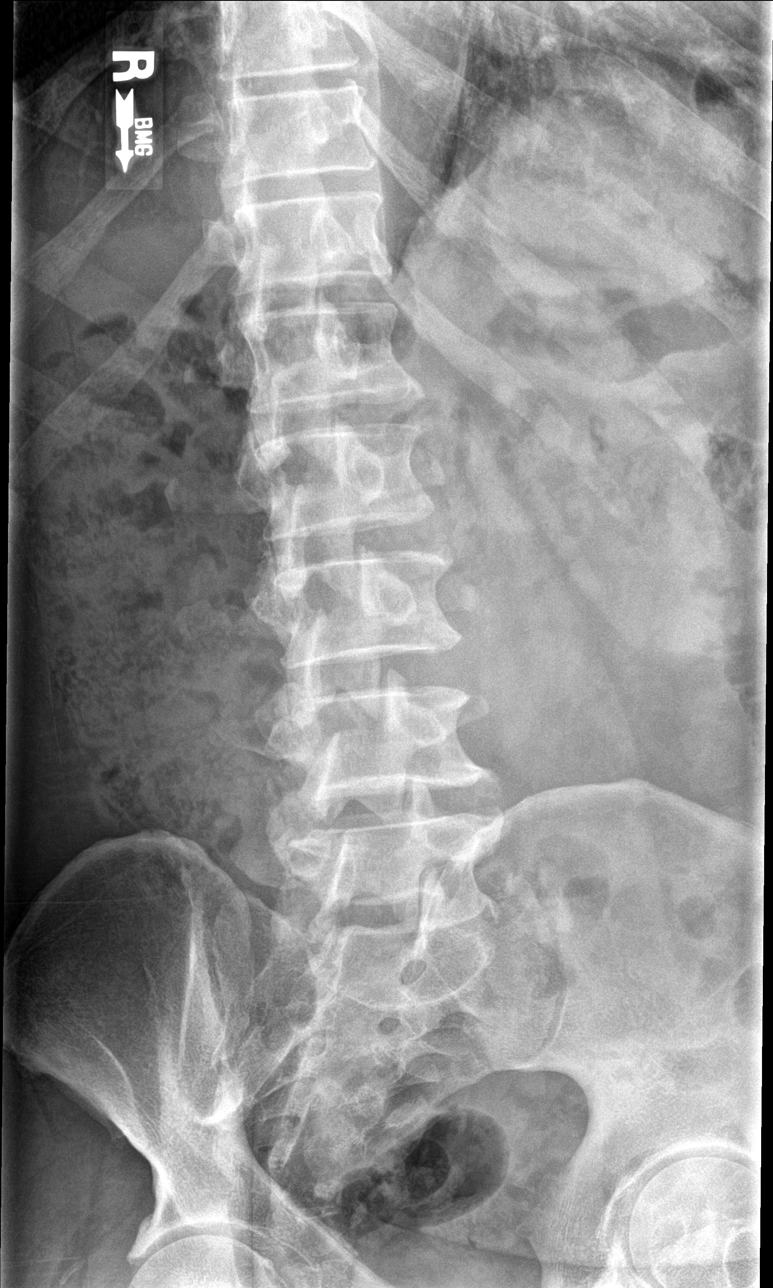

[l-spine lat]
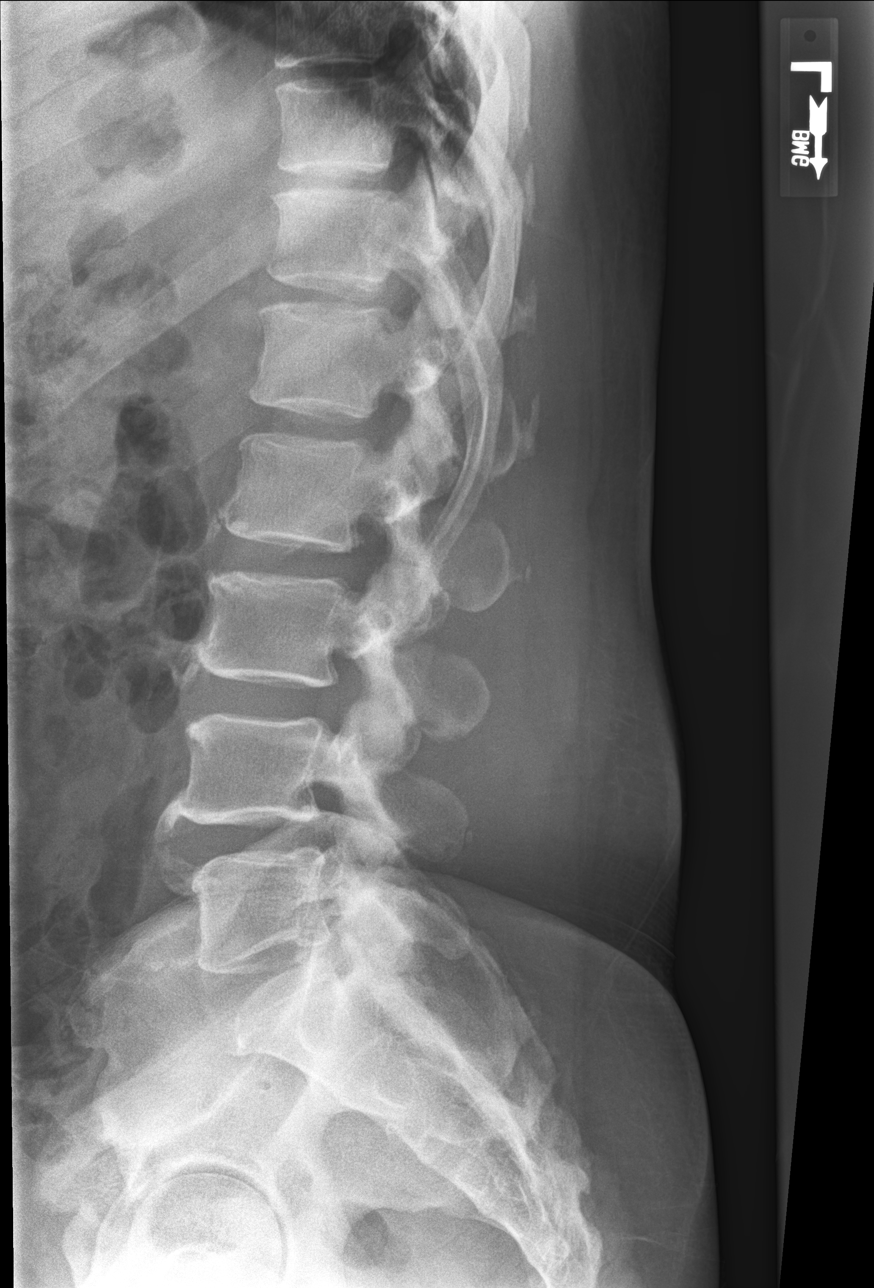

[l-spine l5-s1]
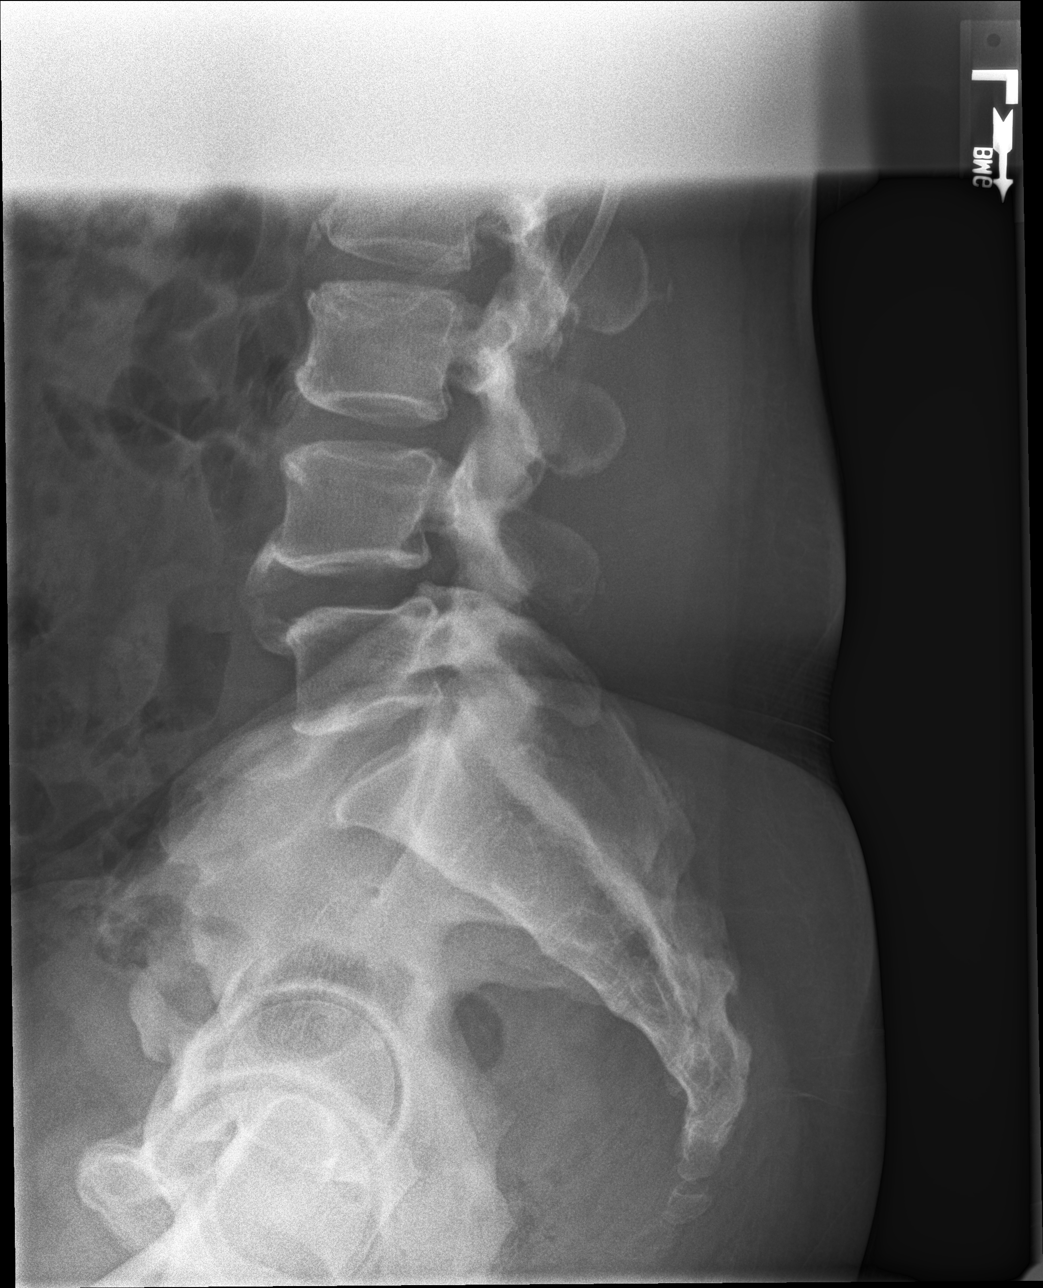

[5 of 5 positions shown; findings below may reference images not displayed]

FINDINGS: No evidence for an acute fracture. No subluxation. Intervertebral
disc spaces are preserved. No substantial facet osteoarthritis.
IMPRESSION: Negative.

## 2017-11-18 MED ORDER — NAPROXEN 500 MG PO TABS: 500.0000 mg | ORAL_TABLET | Freq: Two times a day (BID) | ORAL | 0 refills | Status: DC

## 2017-11-18 MED ORDER — TRAMADOL HCL 50 MG PO TABS: 50.0000 mg | ORAL_TABLET | Freq: Two times a day (BID) | ORAL | 0 refills | Status: DC

## 2017-11-18 NOTE — Patient Instructions (Signed)
     IF you received an x-ray today, you will receive an invoice from [facility]. Please contact [facility] at [phone] with questions or concerns regarding your invoice.   IF you received labwork today, you will receive an invoice from [facility]. Please contact [facility] at [phone] with questions or concerns regarding your invoice.   Our billing staff will not be able to assist you with questions regarding bills from these companies.  You will be contacted with the lab results as soon as they are available. The fastest way to get your results is to activate your My Chart account. Instructions are located on the last page of this paperwork. If you have not heard from us regarding the results in 2 weeks, please contact this office.     

## 2017-11-18 NOTE — Progress Notes (Signed)
11/18/2017 11:54 AM   DOB: 11/05/1955 / MRN: [id number]  SUBJECTIVE:  David Strong is a 62 y.o. male presenting for recheck of uncontrolled diabetes.  He has had difficulty with medication compliance in the past.  He does not take metformin due to side effects. The last time we spoke he was taking his glipizide 3-4 days a week.   Immunization History  Administered Date(s) Administered  . Hepatitis A 07/25/2006  . Td 11/28/2005     He is allergic to codeine and metformin and related.   He  has a past medical history of Diabetes mellitus without complication (HCC).    He  reports that he has quit smoking. He has never used smokeless tobacco. He reports that he drinks alcohol. He reports that he does not use drugs. He  has no sexual activity history on file. The patient  has no past surgical history on file.  His family history is not on file.  Review of Systems  Constitutional: Negative for chills, diaphoresis and fever.  Gastrointestinal: Negative for nausea.  Musculoskeletal: Positive for back pain and myalgias. Negative for falls, joint pain and neck pain.  Skin: Negative for itching and rash.  Neurological: Negative for dizziness.  Endo/Heme/Allergies: Negative for polydipsia.    The problem list and medications were reviewed and updated by myself where necessary and exist elsewhere in the encounter.   OBJECTIVE:  BP 136/88   Pulse 70   Temp 98.5 F (36.9 C)   Resp 16   Ht 5' 5.75" (1.67 m)   Wt 185 lb 3.2 oz (84 kg)   SpO2 97%   BMI 30.12 kg/m   Physical Exam  Constitutional: He appears well-developed. He is active and cooperative.  Non-toxic appearance.  Cardiovascular: Normal rate, regular rhythm, S1 normal, S2 normal, normal heart sounds, intact distal pulses and normal pulses. Exam reveals no gallop and no friction rub.  No murmur heard. Pulmonary/Chest: Effort normal. No stridor. No tachypnea. No respiratory distress. He has no wheezes. He has no rales.    Abdominal: He exhibits no distension.  Musculoskeletal: He exhibits no edema.  Neurological: He is alert.  Skin: Skin is warm and dry. He is not diaphoretic. No pallor.  Vitals reviewed.    Results for orders placed or performed in visit on 11/18/17 (from the past 72 hour(s))  POCT CBC     Status: Abnormal   Collection Time: 11/18/17 11:18 AM  Result Value Ref Range   WBC 3.8 (A) 4.6 - 10.2 K/uL   Lymph, poc 1.9 0.6 - 3.4   POC LYMPH PERCENT 49.9 10 - 50 %L   MID (cbc) 0.2 0 - 0.9   POC MID % 6.4 0 - 12 %M   POC Granulocyte 1.7 (A) 2 - 6.9   Granulocyte percent 44.0 37 - 80 %G   RBC 5.05 4.69 - 6.13 M/uL   Hemoglobin 14.8 14.1 - 18.1 g/dL   HCT, POC 14.744.4 82.943.5 - 53.7 %   MCV 87.8 80 - 97 fL   MCH, POC 29.3 27 - 31.2 pg   MCHC 33.4 31.8 - 35.4 g/dL   RDW, POC 56.214.3 %   Platelet Count, POC 125 (A) 142 - 424 K/uL   MPV 7.4 0 - 99.8 fL  POCT glycosylated hemoglobin (Hb A1C)     Status: None   Collection Time: 11/18/17 11:24 AM  Result Value Ref Range   Hemoglobin A1C 8.1      Dg Lumbar Spine Complete  Result Date: 11/18/2017 CLINICAL DATA:  Chronic back pain. EXAM: LUMBAR SPINE - COMPLETE 4+ VIEW COMPARISON:  08/24/2011 FINDINGS: No evidence for an acute fracture. No subluxation. Intervertebral disc spaces are preserved. No substantial facet osteoarthritis. IMPRESSION: Negative. Electronically Signed   By: Kennith Center M.D.   On: 11/18/2017 11:49    ASSESSMENT AND PLAN:  Raylon was seen today for follow-up.  Diagnoses and all orders for this visit:  Uncontrolled type 2 diabetes mellitus with hyperglycemia (HCC): He has a history of non complinace and has refused the pneumonia shot today.  I have advised him that if he continues to decline my care that he should find another provider.  He will consider this.  -     POCT glycosylated hemoglobin (Hb A1C) -     Pneumococcal polysaccharide vaccine 23-valent greater than or equal to 2yo subcutaneous/IM  Chronic bilateral low back  pain without sciatica -     DG Lumbar Spine Complete; Future  Thrombocytopenia (HCC): Improved today.  Will continue to monitor.  -     POCT CBC  Spondylolisthesis of lumbar region -     Ambulatory referral to Neurosurgery    The patient is advised to call or return to clinic if he does not see an improvement in symptoms, or to seek the care of the closest emergency department if he worsens with the above plan.   Deliah Boston, MHS, PA-C Primary Care at [facility] 11/18/2017 11:54 AM

## 2017-11-19 ENCOUNTER — Telehealth: Payer: Self-pay

## 2017-11-19 NOTE — Telephone Encounter (Signed)
PA submitted for Tramadol 50mg .

## 2017-11-26 ENCOUNTER — Telehealth: Payer: Self-pay

## 2017-11-26 NOTE — Telephone Encounter (Signed)
Phone call to patient, relayed message from provider. Encouraged patient to take all medications as prescribed and follow up in 2 months per provider or sooner if any other questions. He verbalizes understanding. Reviewed AVS from 10/25/2017, upcoming neurology referral. Patient speech is tangential, able to confirm he has no further questions.   Patient states that he is not currently taking tramadol due to making his skin itch. Provider, Lorain ChildesFYI.

## 2017-11-26 NOTE — Telephone Encounter (Signed)
Copied from CRM [phone]. Topic: Inquiry >> Nov 25, 2017  6:30 PM Raquel SarnaHayes, Teresa G wrote: Pt has a question regarding his end of visit sheets. Please call pt back to discuss.

## 2017-11-26 NOTE — Telephone Encounter (Signed)
Patient's CBC is stable. Abnormal values likely secondary to ethnicity. Unfortunately I can not attest to the safety and efficacy of an over the counter dietary supplement.

## 2017-11-26 NOTE — Telephone Encounter (Signed)
Phone call to patient. Patient has questions regarding two things-  1) Is his CBC normal? What do his results mean?  2) Patient's daughter picked up a nutritional supplement for diabetes ([employer] vitamins). Patient wants to know if these will be helpful and if they are okay to take with glipizide.   Provider, please advise.

## 2017-12-02 ENCOUNTER — Telehealth: Payer: Self-pay | Admitting: Physician Assistant

## 2017-12-02 NOTE — Telephone Encounter (Signed)
Copied from CRM [phone]. Topic: Quick Communication - Rx Refill/Question >> Dec 02, 2017  5:40 PM Jonette EvaBarksdale, Harvey B wrote: naproxen (NAPROSYN) 500 MG tablet [[id number]][[id number]]   Pt has been prescribed the medication above and is going to stop taking that medication b/c the medication is not helping and the pt wants to switch to the Red Cap Aleve which he is currently taking , call pt to advise

## 2017-12-03 NOTE — Telephone Encounter (Signed)
Left message for patient to call back- want to know if the Aleve he is requesting is the easy open arthritis cap 12 hour 220 mg.

## 2017-12-03 NOTE — Telephone Encounter (Signed)
Patient called for clarification on his previous call about Red Cap Aleve, he says "I was calling to let Deliah BostonMichael Clark know that I am not taking any of the medication he prescribed, because they don't work for me. [facility] Aleve Arthritis Pain with the red cap and it didn't work. So, I bought the Aleve 220 mg for back and muscle pain, which is working for my pain, so I will continue taking this one whenever I have pain." I clarified and ask is he no longer taking Meloxicam, Naproxen, Tramadol, he says "yes, those are the one's I stopped." I advised this would be sent to Casimiro NeedleMichael.

## 2017-12-10 ENCOUNTER — Telehealth: Payer: Self-pay | Admitting: *Deleted

## 2017-12-10 NOTE — Telephone Encounter (Signed)
Denial letter for tramadol put in box

## 2017-12-13 NOTE — Telephone Encounter (Signed)
Reason for tramadol 50mg  denial: "The requested services is not a covered benefit per your benefit booklet or plan documents."  Provider, FYI.

## 2018-07-11 ENCOUNTER — Other Ambulatory Visit: Payer: Self-pay | Admitting: Physician Assistant

## 2018-07-11 DIAGNOSIS — E1165 Type 2 diabetes mellitus with hyperglycemia: Secondary | ICD-10-CM

## 2018-07-15 NOTE — Telephone Encounter (Signed)
Courtesy refill  

## 2018-11-11 ENCOUNTER — Other Ambulatory Visit: Payer: Self-pay | Admitting: Family Medicine

## 2018-11-11 DIAGNOSIS — E1165 Type 2 diabetes mellitus with hyperglycemia: Secondary | ICD-10-CM

## 2018-11-12 ENCOUNTER — Telehealth (INDEPENDENT_AMBULATORY_CARE_PROVIDER_SITE_OTHER): Payer: Self-pay | Admitting: Family Medicine

## 2018-11-12 ENCOUNTER — Other Ambulatory Visit: Payer: Self-pay

## 2018-11-12 DIAGNOSIS — E1165 Type 2 diabetes mellitus with hyperglycemia: Secondary | ICD-10-CM

## 2018-11-12 MED ORDER — GLIPIZIDE ER 5 MG PO TB24: ORAL_TABLET | ORAL | 0 refills | Status: DC

## 2018-11-12 MED ORDER — GLUCOSE BLOOD VI STRP: ORAL_STRIP | 3 refills | Status: DC

## 2018-11-12 NOTE — Progress Notes (Signed)
Virtual Visit via telephone Note  I connected with a patient on 11/12/18 at 141pm by telephone and verified that I am speaking with the correct person using two identifiers. David Strong is currently located at home and patient is currently with her during visit. The provider, Myles Lipps, MD is located in their home at time of visit.  I discussed the limitations, risks, security and privacy concerns of performing an evaluation and management service by telephone and the availability of in person appointments. I also discussed with the patient that there may be a patient responsible charge related to this service. The patient expressed understanding and agreed to proceed.  CC: meds refills  Telephone visit today for diabetic med refills  HPI Previous PCP Chestine Spore, PA-C Last OV April 2019  Patient checks sugars mostly in the mornings, maybe once a week This morning it was 245 Patient takes glipizide ER 5mg  once a day Gets a rash with metformin Denies any hypoglycemia Denies any increase thirst or urination His eye doctor has moved to [place],needs referral Denies any blurry or double vision Denies any numbness or tingling in his visit Rarely he gets a muscle cramps - resolved with OTC mag  Lab Results  Component Value Date   HGBA1C 8.1 11/18/2017   HGBA1C 8.4 10/25/2017   HGBA1C 8.8 (H) 05/06/2017   Lab Results  Component Value Date   LDLCALC 99 10/25/2017   CREATININE 1.07 10/25/2017  ?  Fall Risk  11/12/2018 11/18/2017 05/06/2017 01/17/2016  Falls in the past year? 0 No No No  Number falls in past yr: 0 - - -  Injury with Fall? 0 - - -  Follow up Falls evaluation completed - - -     Depression screen [facility] 2/9 11/12/2018 11/18/2017 05/06/2017  Decreased Interest 0 0 0  Down, Depressed, Hopeless 0 0 0  PHQ - 2 Score 0 0 0    Allergies  Allergen Reactions  . Codeine Itching  . Metformin And Related Rash    Prior to Admission medications   Medication Sig Start  Date End Date Taking? Authorizing Provider  GLIPIZIDE XL 5 MG 24 hr tablet TAKE 1 TABLET BY MOUTH EVERY DAY IN THE MORNING 07/15/18  Yes Myles Lipps, MD  glucose blood (ACCU-CHEK AVIVA PLUS) test strip Use to test blood glucose daily. 05/06/17  Yes Silvestre Mesi    Past Medical History:  Diagnosis Date  . Diabetes mellitus without complication (HCC)     No past surgical history on file.  Social History   Tobacco Use  . Smoking status: Former Games developer  . Smokeless tobacco: Never Used  Substance Use Topics  . Alcohol use: Yes    Comment: occasional    No family history on file.  ROS  Objective  Vitals as reported by the patient: none  There were no vitals filed for this visit.  ASSESSMENT and PLAN  1. Type 2 diabetes mellitus with hyperglycemia, without long-term current use of insulin (HCC) Checking labs today, medications will be adjusted as needed.  - glipiZIDE (GLIPIZIDE XL) 5 MG 24 hr tablet; TAKE 1 TABLET BY MOUTH EVERY DAY IN THE MORNING - glucose blood (ACCU-CHEK AVIVA PLUS) test strip; Use to test blood glucose daily. - Ambulatory referral to Ophthalmology - Hemoglobin A1c; Future - Comprehensive metabolic panel; Future - Lipid panel; Future - Microalbumin/Creatinine Ratio, Urine; Future  FOLLOW-UP:  This week fasting labs and BP check  3 month for DM followup  The above assessment and management plan was discussed with the patient. The patient verbalized understanding of and has agreed to the management plan. Patient is aware to call the clinic if symptoms persist or worsen. Patient is aware when to return to the clinic for a follow-up visit. Patient educated on when it is appropriate to go to the emergency department.    I provided 13  minutes of non-face-to-face time during this encounter.  Myles Lipps, MD Primary Care at [facility] [address] [place], [place] [address] Ph.  [phone] Fax [phone]

## 2018-11-12 NOTE — Progress Notes (Signed)
Diabetes F/u with med refill request

## 2018-11-14 ENCOUNTER — Ambulatory Visit (INDEPENDENT_AMBULATORY_CARE_PROVIDER_SITE_OTHER): Payer: Self-pay | Admitting: Family Medicine

## 2018-11-14 DIAGNOSIS — Z0189 Encounter for other specified special examinations: Secondary | ICD-10-CM

## 2018-11-14 DIAGNOSIS — E1165 Type 2 diabetes mellitus with hyperglycemia: Secondary | ICD-10-CM

## 2018-11-14 NOTE — Progress Notes (Signed)
Nurse visit for labs only

## 2018-11-15 LAB — COMPREHENSIVE METABOLIC PANEL
ALT: 29 IU/L (ref 0–44)
AST: 27 IU/L (ref 0–40)
Albumin/Globulin Ratio: 2.1 (ref 1.2–2.2)
Albumin: 4.4 g/dL (ref 3.8–4.8)
Alkaline Phosphatase: 84 IU/L (ref 39–117)
BUN/Creatinine Ratio: 14 (ref 10–24)
BUN: 13 mg/dL (ref 8–27)
Bilirubin Total: 0.6 mg/dL (ref 0.0–1.2)
CO2: 23 mmol/L (ref 20–29)
Calcium: 9.4 mg/dL (ref 8.6–10.2)
Chloride: 102 mmol/L (ref 96–106)
Creatinine, Ser: 0.9 mg/dL (ref 0.76–1.27)
GFR calc Af Amer: 105 mL/min/{1.73_m2} (ref >59)
GFR calc non Af Amer: 91 mL/min/{1.73_m2} (ref >59)
Globulin, Total: 2.1 g/dL (ref 1.5–4.5)
Glucose: 315 mg/dL — ABNORMAL HIGH (ref 65–99)
Potassium: 4 mmol/L (ref 3.5–5.2)
Sodium: 140 mmol/L (ref 134–144)
Total Protein: 6.5 g/dL (ref 6.0–8.5)

## 2018-11-15 LAB — LIPID PANEL
Chol/HDL Ratio: 4.7 ratio (ref 0.0–5.0)
Cholesterol, Total: 188 mg/dL (ref 100–199)
HDL: 40 mg/dL (ref >39)
LDL Calculated: 125 mg/dL — ABNORMAL HIGH (ref 0–99)
Triglycerides: 114 mg/dL (ref 0–149)
VLDL Cholesterol Cal: 23 mg/dL (ref 5–40)

## 2018-11-15 LAB — MICROALBUMIN / CREATININE URINE RATIO
Creatinine, Urine: 112.6 mg/dL
Microalb/Creat Ratio: <3 mg/g creat (ref 0–29)
Microalbumin, Urine: <3 ug/mL

## 2018-11-15 LAB — HEMOGLOBIN A1C
Est. average glucose Bld gHb Est-mCnc: 275 mg/dL
Hgb A1c MFr Bld: 11.2 % — ABNORMAL HIGH (ref 4.8–5.6)

## 2018-11-28 ENCOUNTER — Telehealth: Payer: Self-pay | Admitting: Family Medicine

## 2018-11-28 ENCOUNTER — Other Ambulatory Visit: Payer: Self-pay | Admitting: Family Medicine

## 2018-11-28 DIAGNOSIS — E1165 Type 2 diabetes mellitus with hyperglycemia: Secondary | ICD-10-CM

## 2018-11-28 NOTE — Telephone Encounter (Signed)
Patient is asking if the pharmacy can be called on [address]. The pharmacy states that they do have the medication they are needing the office to call.

## 2018-11-28 NOTE — Telephone Encounter (Signed)
Patient states that pharmacy doesn't have this medication.

## 2018-11-28 NOTE — Telephone Encounter (Signed)
Copied from CRM [id number]. Topic: Quick Communication - Rx Refill/Question >> Nov 28, 2018  1:41 PM [name] A wrote: Medication: glipiZIDE (GLIPIZIDE XL) 5 MG 24 hr tablet [[id number]]  Patient states that the pharmacy does not have the medication  Has the patient contacted their pharmacy? Yes  (Agent: If no, request that the patient contact the pharmacy for the refill.) (Agent: If yes, when and what did the pharmacy advise?)  Preferred Pharmacy (with phone number or street name): [facility] [address] [place], [place] [address] [phone]  Agent: Please be advised that RX refills may take up to 3 business days. We ask that you follow-up with your pharmacy.

## 2018-12-01 ENCOUNTER — Other Ambulatory Visit: Payer: Self-pay

## 2018-12-01 DIAGNOSIS — E1165 Type 2 diabetes mellitus with hyperglycemia: Secondary | ICD-10-CM

## 2018-12-01 MED ORDER — GLIPIZIDE ER 5 MG PO TB24: ORAL_TABLET | ORAL | 0 refills | Status: DC

## 2018-12-05 ENCOUNTER — Other Ambulatory Visit: Payer: Self-pay | Admitting: Family Medicine

## 2018-12-05 DIAGNOSIS — E1165 Type 2 diabetes mellitus with hyperglycemia: Secondary | ICD-10-CM

## 2018-12-05 MED ORDER — GLIPIZIDE ER 5 MG PO TB24: 5.0000 mg | ORAL_TABLET | Freq: Two times a day (BID) | ORAL | 0 refills | Status: DC

## 2018-12-05 MED ORDER — ATORVASTATIN CALCIUM 40 MG PO TABS: 40.0000 mg | ORAL_TABLET | Freq: Every day | ORAL | 3 refills | Status: DC

## 2018-12-05 NOTE — Telephone Encounter (Signed)
Spoke with pt and he informed me he did pick up Rx.

## 2018-12-16 ENCOUNTER — Telehealth: Payer: Self-pay | Admitting: Family Medicine

## 2018-12-16 NOTE — Telephone Encounter (Signed)
Copied from CRM [id number]. Topic: General - Other >> Dec 16, 2018  1:18 PM Marylen Ponto wrote: Reason for CRM: Pt stated the labs were done in March and he has not received anything. Pt stated he would like his most recent lab results to be mailed to his home.

## 2018-12-18 NOTE — Telephone Encounter (Signed)
Mailed results  

## 2019-01-29 ENCOUNTER — Ambulatory Visit: Payer: Self-pay | Admitting: Family Medicine

## 2019-01-29 ENCOUNTER — Other Ambulatory Visit: Payer: Self-pay

## 2019-01-29 ENCOUNTER — Encounter: Payer: Self-pay | Admitting: Family Medicine

## 2019-01-29 VITALS — BP 129/83 | HR 85 | Temp 98.3°F | Ht 64.0 in | Wt 189.4 lb

## 2019-01-29 DIAGNOSIS — E785 Hyperlipidemia, unspecified: Secondary | ICD-10-CM

## 2019-01-29 DIAGNOSIS — E1169 Type 2 diabetes mellitus with other specified complication: Secondary | ICD-10-CM

## 2019-01-29 DIAGNOSIS — Z125 Encounter for screening for malignant neoplasm of prostate: Secondary | ICD-10-CM

## 2019-01-29 DIAGNOSIS — E1165 Type 2 diabetes mellitus with hyperglycemia: Secondary | ICD-10-CM

## 2019-01-29 LAB — HEMOGLOBIN A1C
Est. average glucose Bld gHb Est-mCnc: 243 mg/dL
Hgb A1c MFr Bld: 10.1 % — ABNORMAL HIGH (ref 4.8–5.6)

## 2019-01-29 LAB — LIPID PANEL

## 2019-01-29 MED ORDER — GLIPIZIDE ER 5 MG PO TB24: 5.0000 mg | ORAL_TABLET | Freq: Two times a day (BID) | ORAL | 0 refills | Status: DC

## 2019-01-29 MED ORDER — ATORVASTATIN CALCIUM 40 MG PO TABS: 40.0000 mg | ORAL_TABLET | Freq: Every day | ORAL | 3 refills | Status: DC

## 2019-01-29 NOTE — Progress Notes (Signed)
6/11/202011:58 AM  David Strong 03/08/1956, 63 y.o., male [id number]  Chief Complaint  Patient presents with  . Diabetes    HPI:   Patient is a 63 y.o. male with past medical history significant for DM2 who presents today for routine followup  Last OV march 2020 Increased glipizide and started atorvastatin He never received atorvastatin Denies any issues with current medications Has been checking cbg every day His gluocse has come down from 300s to 170s He has stopped eating rice late at night He thinks weight is stable He is expecting a child this Aug He denies any increased thirst or urination, chest pain, SOB, palpitations, edema, nausea, vomiting or abd pain  Wt Readings from Last 3 Encounters:  01/29/19 189 lb 6.4 oz (85.9 kg)  11/18/17 185 lb 3.2 oz (84 kg)  10/25/17 182 lb 9.6 oz (82.8 kg)    Lab Results  Component Value Date   HGBA1C 11.2 (H) 11/14/2018   HGBA1C 8.1 11/18/2017   HGBA1C 8.4 10/25/2017   Lab Results  Component Value Date   LDLCALC 125 (H) 11/14/2018   CREATININE 0.90 11/14/2018    The 10-year ASCVD risk score Mikey Bussing DC Jr., et al., 2013) is: 17.2%   Values used to calculate the score:     Age: 40 years     Sex: Male     Is Non-Hispanic African American: Yes     Diabetic: Yes     Tobacco smoker: No     Systolic Blood Pressure: 093 mmHg     Is BP treated: No     HDL Cholesterol: 40 mg/dL     Total Cholesterol: 188 mg/dL  Fall Risk  01/29/2019 11/12/2018 11/18/2017 05/06/2017 01/17/2016  Falls in the past year? 0 0 No No No  Number falls in past yr: 0 0 - - -  Injury with Fall? 0 0 - - -  Follow up Falls evaluation completed Falls evaluation completed - - -     Depression screen [facility] 2/9 01/29/2019 11/12/2018 11/18/2017  Decreased Interest 0 0 0  Down, Depressed, Hopeless 0 0 0  PHQ - 2 Score 0 0 0    Allergies  Allergen Reactions  . Codeine Itching  . Metformin And Related Rash    Prior to Admission medications   Medication Sig  Start Date End Date Taking? Authorizing Provider  glipiZIDE (GLIPIZIDE XL) 5 MG 24 hr tablet Take 1 tablet (5 mg total) by mouth 2 (two) times daily. 12/05/18  Yes Rutherford Guys, MD  atorvastatin (LIPITOR) 40 MG tablet Take 1 tablet (40 mg total) by mouth daily. Patient not taking: Reported on 01/29/2019 12/05/18   Rutherford Guys, MD  glucose blood (ACCU-CHEK AVIVA PLUS) test strip Use to test blood glucose daily. Patient not taking: Reported on 01/29/2019 11/12/18   Rutherford Guys, MD    Past Medical History:  Diagnosis Date  . Diabetes mellitus without complication ([place])     No past surgical history on file.  Social History   Tobacco Use  . Smoking status: Former Research scientist (life sciences)  . Smokeless tobacco: Never Used  Substance Use Topics  . Alcohol use: Yes    Comment: occasional    No family history on file.  ROS Per hpi  OBJECTIVE:  Today's Vitals   01/29/19 1128  BP: 129/83  Pulse: 85  Temp: 98.3 F (36.8 C)  TempSrc: Oral  SpO2: 96%  Weight: 189 lb 6.4 oz (85.9 kg)  Height: '5\' 4"'  (1.626 m)  Body mass index is 32.51 kg/m.   Physical Exam Vitals signs and nursing note reviewed.  Constitutional:      Appearance: He is well-developed.  HENT:     Head: Normocephalic and atraumatic.  Eyes:     Conjunctiva/sclera: Conjunctivae normal.     Pupils: Pupils are equal, round, and reactive to light.  Neck:     Musculoskeletal: Neck supple.  Cardiovascular:     Rate and Rhythm: Normal rate and regular rhythm.     Heart sounds: No murmur. No friction rub. No gallop.   Pulmonary:     Effort: Pulmonary effort is normal.     Breath sounds: Normal breath sounds. No wheezing or rales.  Skin:    General: Skin is warm and dry.  Neurological:     Mental Status: He is alert and oriented to person, place, and time.     ASSESSMENT and PLAN  1. Type 2 diabetes mellitus with hyperglycemia, without long-term current use of insulin ([place]) Home cbgs have been coming down,  anticipate improved A1c, cont with LFM. And home monitoring. No changes to medications as long as home cbgs cont to improve.  - CMP14+EGFR - Microalbumin / creatinine urine ratio - Hemoglobin A1c - glipiZIDE (GLIPIZIDE XL) 5 MG 24 hr tablet; Take 1 tablet (5 mg total) by mouth 2 (two) times daily.  2. Hyperlipidemia associated with type 2 diabetes mellitus ([place]) Resent rx for atorvastatin  3. Screening PSA (prostate specific antigen) - PSA  Other orders - atorvastatin (LIPITOR) 40 MG tablet; Take 1 tablet (40 mg total) by mouth daily.  Return in about 3 months (around 05/01/2019).    Rutherford Guys, MD Primary Care at [facility] [place], [place] [address] Ph.  [phone] Fax [phone]

## 2019-01-29 NOTE — Patient Instructions (Signed)
Fasting goal: 90-130 Checking before lunch or dinner: 130-140 Checking 2 hours after eating: 160-180   Diabetes Mellitus and Standards of Medical Care Managing diabetes (diabetes mellitus) can be complicated. Your diabetes treatment may be managed by a team of health care providers, including:  A physician who specializes in diabetes (endocrinologist).  A nurse practitioner or physician assistant.  Nurses.  A diet and nutrition specialist (registered dietitian).  A certified diabetes educator (CDE).  An exercise specialist.  A pharmacist.  An eye doctor.  A foot specialist (podiatrist).  A dentist.  A primary care provider.  A mental health provider. Your health care providers follow guidelines to help you get the best quality of care. The following schedule is a general guideline for your diabetes management plan. Your health care providers may give you more specific instructions. Physical exams Upon being diagnosed with diabetes mellitus, and each year after that, your health care provider will ask about your medical and family history. He or she will also do a physical exam. Your exam may include:  Measuring your height, weight, and body mass index (BMI).  Checking your blood pressure. This will be done at every routine medical visit. Your target blood pressure may vary depending on your medical conditions, your age, and other factors.  Thyroid gland exam.  Skin exam.  Screening for damage to your nerves (peripheral neuropathy). This may include checking the pulse in your legs and feet and checking the level of sensation in your hands and feet.  A complete foot exam to inspect the structure and skin of your feet, including checking for cuts, bruises, redness, blisters, sores, or other problems.  Screening for blood vessel (vascular) problems, which may include checking the pulse in your legs and feet and checking your temperature. Blood tests Depending on your  treatment plan and your personal needs, you may have the following tests done:  HbA1c (hemoglobin A1c). This test provides information about blood sugar (glucose) control over the previous 2-3 months. It is used to adjust your treatment plan, if needed. This test will be done: ? At least 2 times a year, if you are meeting your treatment goals. ? 4 times a year, if you are not meeting your treatment goals or if treatment goals have changed.  Lipid testing, including total, LDL, and HDL cholesterol and triglyceride levels. ? The goal for LDL is less than 100 mg/dL (5.5 mmol/L). If you are at high risk for complications, the goal is less than 70 mg/dL (3.9 mmol/L). ? The goal for HDL is 40 mg/dL (2.2 mmol/L) or higher for men and 50 mg/dL (2.8 mmol/L) or higher for women. An HDL cholesterol of 60 mg/dL (3.3 mmol/L) or higher gives some protection against heart disease. ? The goal for triglycerides is less than 150 mg/dL (8.3 mmol/L).  Liver function tests.  Kidney function tests.  Thyroid function tests. Dental and eye exams  Visit your dentist two times a year.  If you have type 1 diabetes, your health care provider may recommend an eye exam 3-5 years after you are diagnosed, and then once a year after your first exam. ? For children with type 1 diabetes, a health care provider may recommend an eye exam when your child is age 33 or older and has had diabetes for 3-5 years. After the first exam, your child should get an eye exam once a year.  If you have type 2 diabetes, your health care provider may recommend an eye exam as soon  as you are diagnosed, and then once a year after your first exam. Immunizations   The yearly flu (influenza) vaccine is recommended for everyone 6 months or older who has diabetes.  The pneumonia (pneumococcal) vaccine is recommended for everyone 2 years or older who has diabetes. If you are 3565 or older, you may get the pneumonia vaccine as a series of two separate  shots.  The hepatitis B vaccine is recommended for adults shortly after being diagnosed with diabetes.  Adults and children with diabetes should receive all other vaccines according to age-specific recommendations from the Centers for Disease Control and Prevention (CDC). Mental and emotional health Screening for symptoms of eating disorders, anxiety, and depression is recommended at the time of diagnosis and afterward as needed. If your screening shows that you have symptoms (positive screening result), you may need more evaluation and you may work with a mental health care provider. Treatment plan Your treatment plan will be reviewed at every medical visit. You and your health care provider will discuss:  How you are taking your medicines, including insulin.  Any side effects you are experiencing.  Your blood glucose target goals.  The frequency of your blood glucose monitoring.  Lifestyle habits, such as activity level as well as tobacco, alcohol, and substance use. Diabetes self-management education Your health care provider will assess how well you are monitoring your blood glucose levels and whether you are taking your insulin correctly. He or she may refer you to:  A certified diabetes educator to manage your diabetes throughout your life, starting at diagnosis.  A registered dietitian who can create or review your personal nutrition plan.  An exercise specialist who can discuss your activity level and exercise plan. Summary  Managing diabetes (diabetes mellitus) can be complicated. Your diabetes treatment may be managed by a team of health care providers.  Your health care providers follow guidelines in order to help you get the best quality of care.  Standards of care including having regular physical exams, blood tests, blood pressure monitoring, immunizations, screening tests, and education about how to manage your diabetes.  Your health care providers may also give you  more specific instructions based on your individual health. This information is not intended to replace advice given to you by your health care provider. Make sure you discuss any questions you have with your health care provider. Document Released: 06/03/2009 Document Revised: 04/25/2018 Document Reviewed: 05/04/2016 Elsevier Interactive Patient Education  2019 [employer]

## 2019-01-30 ENCOUNTER — Encounter: Payer: Self-pay | Admitting: *Deleted

## 2019-01-30 LAB — CMP14+EGFR
ALT: 17 IU/L (ref 0–44)
AST: 17 IU/L (ref 0–40)
Albumin/Globulin Ratio: 2.4 — ABNORMAL HIGH (ref 1.2–2.2)
Albumin: 4.4 g/dL (ref 3.8–4.8)
Alkaline Phosphatase: 67 IU/L (ref 39–117)
BUN/Creatinine Ratio: 14 (ref 10–24)
BUN: 13 mg/dL (ref 8–27)
Bilirubin Total: 0.9 mg/dL (ref 0.0–1.2)
CO2: 22 mmol/L (ref 20–29)
Calcium: 9.5 mg/dL (ref 8.6–10.2)
Chloride: 104 mmol/L (ref 96–106)
Creatinine, Ser: 0.9 mg/dL (ref 0.76–1.27)
GFR calc Af Amer: 105 mL/min/{1.73_m2} (ref >59)
GFR calc non Af Amer: 91 mL/min/{1.73_m2} (ref >59)
Globulin, Total: 1.8 g/dL (ref 1.5–4.5)
Glucose: 230 mg/dL — ABNORMAL HIGH (ref 65–99)
Potassium: 3.9 mmol/L (ref 3.5–5.2)
Sodium: 139 mmol/L (ref 134–144)
Total Protein: 6.2 g/dL (ref 6.0–8.5)

## 2019-01-30 LAB — LIPID PANEL
Chol/HDL Ratio: 3.8 ratio (ref 0.0–5.0)
Cholesterol, Total: 157 mg/dL (ref 100–199)
HDL: 41 mg/dL (ref >39)
LDL Calculated: 97 mg/dL (ref 0–99)
Triglycerides: 96 mg/dL (ref 0–149)
VLDL Cholesterol Cal: 19 mg/dL (ref 5–40)

## 2019-01-30 LAB — MICROALBUMIN / CREATININE URINE RATIO
Creatinine, Urine: 125.7 mg/dL
Microalb/Creat Ratio: <2 mg/g creat (ref 0–29)
Microalbumin, Urine: <3 ug/mL

## 2019-01-30 LAB — PSA: Prostate Specific Ag, Serum: 0.6 ng/mL (ref 0.0–4.0)

## 2019-01-30 MED ORDER — GLIPIZIDE ER 10 MG PO TB24: 10.0000 mg | ORAL_TABLET | Freq: Two times a day (BID) | ORAL | 0 refills | Status: DC

## 2019-01-30 NOTE — Addendum Note (Signed)
Addended by: Rutherford Guys on: 01/30/2019 01:44 PM   Modules accepted: Orders

## 2019-02-02 ENCOUNTER — Telehealth: Payer: Self-pay | Admitting: Family Medicine

## 2019-02-02 NOTE — Telephone Encounter (Signed)
Copied from [place] [id number]. Topic: General - Other >> Feb 02, 2019 11:09 AM Jodie Echevaria wrote: Reason for CRM: Patient called to say that he have not started taking the atorvastatin (LIPITOR) 40 MG tablet yet because he read the side effects and is afraid to start taking it. He want a call back from Dr Pamella Pert first before he takes this medication Ph# [phone]

## 2019-02-03 LAB — HM DIABETES EYE EXAM

## 2019-02-03 NOTE — Telephone Encounter (Signed)
His cholesterol came back completely normal. Ok to not take cholesterol medication for now. We can talk more at his next visit. thanks

## 2019-02-04 NOTE — Telephone Encounter (Signed)
Patient informed and voiced understanding

## 2019-02-09 ENCOUNTER — Telehealth: Payer: Self-pay | Admitting: Family Medicine

## 2019-02-09 ENCOUNTER — Ambulatory Visit: Payer: Self-pay

## 2019-02-09 DIAGNOSIS — E1165 Type 2 diabetes mellitus with hyperglycemia: Secondary | ICD-10-CM

## 2019-02-09 NOTE — Telephone Encounter (Signed)
Pt is stating that the pharmacy has the correct script that Dr Pamella Pert called in glipiZIDE (GLUCOTROL XL) 10 MG 24 hr tablet to  [place], [place] [phone] (Phone) [phone] (Fax)  But they are trying to give him the P5 glipizide stating it is the same. If you can FU with pharmacy or contact pt. He would appreciate. They will not reorder the XL

## 2019-02-10 NOTE — Telephone Encounter (Signed)
Please let patient know that I have spoken with the pharmacist and indeed glipizide ER and XL are the same medication.  The pharmacy carries ER. I advise that he starts taking glipizide ER 10mg  BID as prescribed. thanks

## 2019-02-17 NOTE — Telephone Encounter (Signed)
Patient is requesting call back from [facility] to discuss this medication.

## 2019-02-19 MED ORDER — GLIPIZIDE ER 10 MG PO TB24: 10.0000 mg | ORAL_TABLET | Freq: Two times a day (BID) | ORAL | 0 refills | Status: DC

## 2019-02-19 NOTE — Telephone Encounter (Signed)
Spoke with patient David Strong and David Strong - causes SOB However he tolerates XL well Sent [facility] instead

## 2019-02-25 NOTE — Telephone Encounter (Signed)
[facility] and Pt would have to wait about a week or so for it. Pt wants to know if Dr. Pamella Pert can send to another pharmacy that has the XL in stock/ please advise

## 2019-05-01 ENCOUNTER — Ambulatory Visit: Payer: Self-pay | Admitting: Family Medicine

## 2019-05-04 ENCOUNTER — Encounter: Payer: Self-pay | Admitting: Family Medicine

## 2019-05-30 ENCOUNTER — Other Ambulatory Visit: Payer: Self-pay | Admitting: Family Medicine

## 2019-05-30 DIAGNOSIS — E1165 Type 2 diabetes mellitus with hyperglycemia: Secondary | ICD-10-CM

## 2019-05-30 NOTE — Telephone Encounter (Signed)
Requested medication (s) are due for refill today: yes  Requested medication (s) are on the active medication list: yes  Last refill:  02/19/2019  Future visit scheduled: no  Notes to clinic:  Diabetes sulfonylureas failed and failed HBA1C  Requested Prescriptions  Pending Prescriptions Disp Refills   GLIPIZIDE XL 10 MG 24 hr tablet [Pharmacy Med Name: GLIPIZIDE XL 10 MG TABLET] 180 tablet 0    Sig: TAKE 1 TABLET (10 MG TOTAL) BY MOUTH 2 (TWO) TIMES DAILY. TAKE WITH BREAKFAST AND DINNER     Endocrinology:  Diabetes - Sulfonylureas Failed - 05/30/2019  8:48 AM      Failed - HBA1C is between 0 and 7.9 and within 180 days    Hgb A1c MFr Bld  Date Value Ref Range Status  01/29/2019 10.1 (H) 4.8 - 5.6 % Final    Comment:             Prediabetes: 5.7 - 6.4          Diabetes: >6.4          Glycemic control for adults with diabetes: <7.0          Passed - Valid encounter within last 6 months    Recent Outpatient Visits          4 months ago Type 2 diabetes mellitus with hyperglycemia, without long-term current use of insulin ([place])   Primary Care at [facility], Lilia Argue, MD   6 months ago Visit for laboratory test   Primary Care at [facility], [place], MD   6 months ago Type 2 diabetes mellitus with hyperglycemia, without long-term current use of insulin [facility])   Primary [facility] at [facility], Lilia Argue, MD   1 year ago Uncontrolled type 2 diabetes mellitus with hyperglycemia [facility])   Primary Care at [place], PA-C   1 year ago Type 2 diabetes mellitus without complication, without long-term current use of insulin [facility])   Primary Care at [place], PA-C               yes

## 2019-06-01 NOTE — Telephone Encounter (Signed)
Patient calling to check status of this request.  

## 2019-08-24 ENCOUNTER — Other Ambulatory Visit: Payer: Self-pay | Admitting: Family Medicine

## 2019-08-24 DIAGNOSIS — E1165 Type 2 diabetes mellitus with hyperglycemia: Secondary | ICD-10-CM

## 2019-08-26 ENCOUNTER — Telehealth: Payer: Self-pay | Admitting: *Deleted

## 2019-08-26 NOTE — Telephone Encounter (Signed)
Please schedule patient an appointment.  

## 2019-08-26 NOTE — Telephone Encounter (Signed)
Scheduled pt a virtual call

## 2019-08-27 ENCOUNTER — Other Ambulatory Visit: Payer: Self-pay

## 2019-08-27 ENCOUNTER — Telehealth (INDEPENDENT_AMBULATORY_CARE_PROVIDER_SITE_OTHER): Payer: Self-pay | Admitting: Family Medicine

## 2019-08-27 DIAGNOSIS — E1165 Type 2 diabetes mellitus with hyperglycemia: Secondary | ICD-10-CM

## 2019-08-27 MED ORDER — GLIPIZIDE ER 10 MG PO TB24: ORAL_TABLET | ORAL | 0 refills | Status: DC

## 2019-08-27 MED ORDER — GLIPIZIDE ER 10 MG PO TB24: ORAL_TABLET | ORAL | 2 refills | Status: DC

## 2019-08-27 NOTE — Progress Notes (Signed)
   Virtual Visit Note  I connected with patient on 08/27/19 at 600pm by phone and verified that I am speaking with the correct person using two identifiers. David Strong is currently located at home and patient is currently with them during visit. The provider, Myles Lipps, MD is located in their office at time of visit.  I discussed the limitations, risks, security and privacy concerns of performing an evaluation and management service by telephone and the availability of in person appointments. I also discussed with the patient that there may be a patient responsible charge related to this service. The patient expressed understanding and agreed to proceed.   CC: diabetes  HPI ? PMH:  DM2  Son born in aug 2020 His cbgs were doing well until recently with the holidays Previously in the 120-140s He has resumed his diet  He has been wo glipizide XL 10mg  BID for 4 days Today cbg 183, coming down, used to be in the 200s He is not taking cholesterol medication, does not want to, has no fhx of CAD  Allergies  Allergen Reactions  . Codeine Itching  . Glipizide Er [Glipizide]     SOB, malaise. He tolerates XL without issues.  . Metformin And Related Rash    Prior to Admission medications   Medication Sig Start Date End Date Taking? Authorizing Provider  atorvastatin (LIPITOR) 40 MG tablet Take 1 tablet (40 mg total) by mouth daily. 01/29/19   03/31/19, MD  GLIPIZIDE XL 10 MG 24 hr tablet TAKE 1 TABLET (10 MG TOTAL) BY MOUTH 2 (TWO) TIMES DAILY. TAKE WITH BREAKFAST AND DINNER 06/01/19   08/01/19, MD  glucose blood (ACCU-CHEK AVIVA PLUS) test strip Use to test blood glucose daily. Patient not taking: Reported on 01/29/2019 11/12/18   11/14/18, MD    Past Medical History:  Diagnosis Date  . Diabetes mellitus without complication (HCC)     No past surgical history on file.  Social History   Tobacco Use  . Smoking status: Former Myles Lipps  . Smokeless  tobacco: Never Used  Substance Use Topics  . Alcohol use: Yes    Comment: occasional    No family history on file.  Review of Systems  Constitutional: Negative for chills and fever.  Respiratory: Negative for cough and shortness of breath.   Cardiovascular: Negative for chest pain, palpitations and leg swelling.  Gastrointestinal: Negative for abdominal pain, nausea and vomiting.    Objective  Vitals as reported by the patient: none   ASSESSMENT and PLAN  1. Type 2 diabetes mellitus with hyperglycemia, without long-term current use of insulin (HCC) Discussed LFM, restart meds. Recheck in 3 months as previous reported a1c were acceptable.  - glipiZIDE (GLIPIZIDE XL) 10 MG 24 hr tablet; TAKE 1 TABLET (10 MG TOTAL) BY MOUTH 2 (TWO) TIMES DAILY. TAKE WITH BREAKFAST AND DINNER  FOLLOW-UP: 3 months   The above assessment and management plan was discussed with the patient. The patient verbalized understanding of and has agreed to the management plan. Patient is aware to call the clinic if symptoms persist or worsen. Patient is aware when to return to the clinic for a follow-up visit. Patient educated on when it is appropriate to go to the emergency department.    I provided 9 minutes of non-face-to-face time during this encounter.  Games developer, MD Primary Care at [facility] [address] [place], [place] Ph.  [phone] Fax [phone]

## 2019-09-17 ENCOUNTER — Telehealth: Payer: Self-pay

## 2019-09-17 NOTE — Telephone Encounter (Signed)
Pt called in stating his test strips say "Use by.." and have a date in 2022.  Confirmed that these are ok to use now.  That is the manufacturer's recommended expiration date.  Pt verbalized understanding and denies further questions.

## 2019-11-22 ENCOUNTER — Other Ambulatory Visit: Payer: Self-pay | Admitting: Family Medicine

## 2019-11-22 DIAGNOSIS — E1165 Type 2 diabetes mellitus with hyperglycemia: Secondary | ICD-10-CM

## 2019-11-22 NOTE — Telephone Encounter (Signed)
Requested medications are due for refill today?  Yes - Please see message below as to why this medication was not refilled for the patient.    Message from pharmacy when this RN tried to refills RX:    The order you placed, GLIPIZIDE XL 10 MG PO TB24, might be dispensed as glipiZIDE (GLIPIZIDE XL) 10 MG 24 hr tablet, which is not on the preferred formulary for the patient's insurance plan. Below are alternatives which are likely to be more affordable. Do not assume that every medication presented is a clinically appropriate alternative.  These alternatives are medications that are in the same pharmaceutical subclass (Sulfonylureas) as the ordered medication and are on formulary for the patient's insurance plan.  Requested medications are on active medication list? Yes  Last Refill:  08/27/2019  # 60 with 2 refills   Future visit scheduled?  Yes   Notes to Clinic:

## 2019-11-25 ENCOUNTER — Other Ambulatory Visit: Payer: Self-pay | Admitting: Family Medicine

## 2019-11-25 DIAGNOSIS — E1165 Type 2 diabetes mellitus with hyperglycemia: Secondary | ICD-10-CM

## 2019-11-25 NOTE — Telephone Encounter (Signed)
Requested medication (s) are due for refill today: yes  Requested medication (s) are on the active medication list: yes  Last refill: 08/31/2019  Future visit scheduled: yes  Notes to clinic:  looks like medication was refused  Patient has upcoming appointment in 2 weeks   Requested Prescriptions  Pending Prescriptions Disp Refills   GLIPIZIDE XL 10 MG 24 hr tablet [Pharmacy Med Name: GLIPIZIDE XL 10 MG TABLET] 180 tablet     Sig: TAKE 1 TABLET (10 MG TOTAL) BY MOUTH 2 (TWO) TIMES DAILY. TAKE WITH BREAKFAST AND DINNER      Endocrinology:  Diabetes - Sulfonylureas Failed - 11/25/2019 11:02 AM      Failed - HBA1C is between 0 and 7.9 and within 180 days    Hgb A1c MFr Bld  Date Value Ref Range Status  01/29/2019 10.1 (H) 4.8 - 5.6 % Final    Comment:             Prediabetes: 5.7 - 6.4          Diabetes: >6.4          Glycemic control for adults with diabetes: <7.0           Passed - Valid encounter within last 6 months    Recent Outpatient Visits           3 months ago Type 2 diabetes mellitus with hyperglycemia, without long-term current use of insulin (HCC)   [facility] at [facility], Meda Coffee, MD   10 months ago Type 2 diabetes mellitus with hyperglycemia, without long-term current use of insulin [facility])   [facility] at [facility], Meda Coffee, MD   1 year ago Visit for laboratory test   Primary Care at [facility], [place] A, MD   1 year ago Type 2 diabetes mellitus with hyperglycemia, without long-term current use of insulin [facility])   [facility], Meda Coffee, MD   2 years ago Uncontrolled type 2 diabetes mellitus with hyperglycemia [facility])   [facility], Marolyn Hammock, PA-C       Future Appointments             In 2 weeks Myles Lipps, MD Primary Care at [place], [facility]

## 2019-12-14 ENCOUNTER — Ambulatory Visit (INDEPENDENT_AMBULATORY_CARE_PROVIDER_SITE_OTHER): Payer: 59 | Admitting: Family Medicine

## 2019-12-14 ENCOUNTER — Other Ambulatory Visit: Payer: Self-pay

## 2019-12-14 ENCOUNTER — Encounter: Payer: Self-pay | Admitting: Family Medicine

## 2019-12-14 DIAGNOSIS — E1165 Type 2 diabetes mellitus with hyperglycemia: Secondary | ICD-10-CM

## 2019-12-14 LAB — POCT GLYCOSYLATED HEMOGLOBIN (HGB A1C): Hemoglobin A1C: 8.5 % — AB (ref 4.0–5.6)

## 2019-12-14 MED ORDER — GLIPIZIDE ER 10 MG PO TB24: ORAL_TABLET | ORAL | 1 refills | Status: DC

## 2019-12-14 NOTE — Progress Notes (Signed)
4/26/202111:50 AM  David Strong 18-Jun-1956, 64 y.o., male [id number]  Chief Complaint  Patient presents with  . Diabetes    range 112 to 185     HPI:   Patient is a 64 y.o. male with past medical history significant for DM2 who presents today for routine followup  Last OV Jan 2021 - restarted glipizide, did not want to take statin  Has been working on diet Checks cbgs fasting Down from 300s Today 161, has been checking less this month  Lab Results  Component Value Date   HGBA1C 10.1 (H) 01/29/2019   HGBA1C 11.2 (H) 11/14/2018   HGBA1C 8.1 11/18/2017   Lab Results  Component Value Date   LDLCALC 97 01/29/2019   CREATININE 0.90 01/29/2019    Depression screen PHQ 2/9 12/14/2019 08/27/2019 01/29/2019  Decreased Interest 0 0 0  Down, Depressed, Hopeless 0 0 0  PHQ - 2 Score 0 0 0    Fall Risk  12/14/2019 08/27/2019 01/29/2019 11/12/2018 11/18/2017  Falls in the past year? 0 0 0 0 No  Number falls in past yr: 0 0 0 0 -  Injury with Fall? 0 0 0 0 -  Follow up Falls evaluation completed Falls evaluation completed Falls evaluation completed Falls evaluation completed -     Allergies  Allergen Reactions  . Codeine Itching  . Glipizide Er [Glipizide]     SOB, malaise. He tolerates XL without issues.  . Metformin And Related Rash    Prior to Admission medications   Medication Sig Start Date End Date Taking? Authorizing Provider  glipiZIDE (GLIPIZIDE XL) 10 MG 24 hr tablet TAKE 1 TABLET (10 MG TOTAL) BY MOUTH 2 (TWO) TIMES DAILY. TAKE WITH BREAKFAST AND DINNER 11/25/19  Yes Rutherford Guys, MD  glucose blood (ACCU-CHEK AVIVA PLUS) test strip Use to test blood glucose daily. 11/12/18  Yes Rutherford Guys, MD    Past Medical History:  Diagnosis Date  . Diabetes mellitus without complication ([place])     No past surgical history on file.  Social History   Tobacco Use  . Smoking status: Former Research scientist (life sciences)  . Smokeless tobacco: Never Used  Substance Use Topics  . Alcohol use:  Yes    Comment: occasional    No family history on file.  Review of Systems  Constitutional: Negative for chills and fever.  Respiratory: Negative for cough and shortness of breath.   Cardiovascular: Negative for chest pain, palpitations and leg swelling.  Gastrointestinal: Negative for abdominal pain, nausea and vomiting.     OBJECTIVE:  Today's Vitals   12/14/19 1138  BP: 121/82  Pulse: 74  Temp: 98 F (36.7 C)  SpO2: 98%  Weight: 187 lb (84.8 kg)  Height: _0  (1.626 m)   Body mass index is 32.1 kg/m.   Wt Readings from Last 3 Encounters:  12/14/19 187 lb (84.8 kg)  01/29/19 189 lb 6.4 oz (85.9 kg)  11/18/17 185 lb 3.2 oz (84 kg)     Physical Exam Vitals and nursing note reviewed.  Constitutional:      Appearance: He is well-developed.  HENT:     Head: Normocephalic and atraumatic.  Eyes:     Conjunctiva/sclera: Conjunctivae normal.     Pupils: Pupils are equal, round, and reactive to light.  Cardiovascular:     Rate and Rhythm: Normal rate and regular rhythm.     Heart sounds: No murmur. No friction rub. No gallop.   Pulmonary:     Effort: Pulmonary  effort is normal.     Breath sounds: Normal breath sounds. No wheezing, rhonchi or rales.  Musculoskeletal:     Cervical back: Neck supple.  Skin:    General: Skin is warm and dry.  Neurological:     Mental Status: He is alert and oriented to person, place, and time.     Results for orders placed or performed in visit on 12/14/19 (from the past 24 hour(s))  POCT glycosylated hemoglobin (Hb A1C)     Status: Abnormal   Collection Time: 12/14/19 12:10 PM  Result Value Ref Range   Hemoglobin A1C 8.5 (A) 4.0 - 5.6 %   HbA1c POC (<> result, manual entry)     HbA1c, POC (prediabetic range)     HbA1c, POC (controlled diabetic range)      No results found.   ASSESSMENT and PLAN  1. Type 2 diabetes mellitus with hyperglycemia, without long-term current use of insulin (HCC) Sign improved. Cont with  current mgt. Cont working LFM - glipiZIDE (GLIPIZIDE XL) 10 MG 24 hr tablet; TAKE 1 TABLET (10 MG TOTAL) BY MOUTH 2 (TWO) TIMES DAILY. TAKE WITH BREAKFAST AND DINNER - POCT glycosylated hemoglobin (Hb A1C) - CMP14+EGFR - Lipid panel  Return in about 6 months (around 06/14/2020).    Rutherford Guys, MD Primary Care at [place], [place] [address] Ph.  [phone] Fax [phone]

## 2019-12-14 NOTE — Patient Instructions (Signed)
° ° ° °  If you have lab work done today you will be contacted with your lab results within the next 2 weeks.  If you have not heard from us then please contact us. The fastest way to get your results is to register for My Chart. ° ° °IF you received an x-ray today, you will receive an invoice from [facility]. Please contact [facility] at [phone] with questions or concerns regarding your invoice.  ° °IF you received labwork today, you will receive an invoice from [facility]. Please contact [facility] at [phone] with questions or concerns regarding your invoice.  ° °Our billing staff will not be able to assist you with questions regarding bills from these companies. ° °You will be contacted with the lab results as soon as they are available. The fastest way to get your results is to activate your My Chart account. Instructions are located on the last page of this paperwork. If you have not heard from us regarding the results in 2 weeks, please contact this office. °  ° ° ° °

## 2019-12-15 LAB — CMP14+EGFR
ALT: 19 IU/L (ref 0–44)
AST: 22 IU/L (ref 0–40)
Albumin/Globulin Ratio: 2.2 (ref 1.2–2.2)
Albumin: 4.6 g/dL (ref 3.8–4.8)
Alkaline Phosphatase: 64 IU/L (ref 39–117)
BUN/Creatinine Ratio: 11 (ref 10–24)
BUN: 12 mg/dL (ref 8–27)
Bilirubin Total: 0.8 mg/dL (ref 0.0–1.2)
CO2: 23 mmol/L (ref 20–29)
Calcium: 9.7 mg/dL (ref 8.6–10.2)
Chloride: 100 mmol/L (ref 96–106)
Creatinine, Ser: 1.09 mg/dL (ref 0.76–1.27)
GFR calc Af Amer: 83 mL/min/{1.73_m2} (ref >59)
GFR calc non Af Amer: 72 mL/min/{1.73_m2} (ref >59)
Globulin, Total: 2.1 g/dL (ref 1.5–4.5)
Glucose: 264 mg/dL — ABNORMAL HIGH (ref 65–99)
Potassium: 4.1 mmol/L (ref 3.5–5.2)
Sodium: 139 mmol/L (ref 134–144)
Total Protein: 6.7 g/dL (ref 6.0–8.5)

## 2019-12-15 LAB — LIPID PANEL
Chol/HDL Ratio: 4.3 ratio (ref 0.0–5.0)
Cholesterol, Total: 183 mg/dL (ref 100–199)
HDL: 43 mg/dL (ref >39)
LDL Chol Calc (NIH): 115 mg/dL — ABNORMAL HIGH (ref 0–99)
Triglycerides: 138 mg/dL (ref 0–149)
VLDL Cholesterol Cal: 25 mg/dL (ref 5–40)

## 2019-12-30 ENCOUNTER — Other Ambulatory Visit: Payer: Self-pay | Admitting: Family Medicine

## 2019-12-30 DIAGNOSIS — E1165 Type 2 diabetes mellitus with hyperglycemia: Secondary | ICD-10-CM

## 2019-12-30 MED ORDER — ACCU-CHEK AVIVA PLUS VI STRP: ORAL_STRIP | 3 refills | Status: DC

## 2019-12-30 NOTE — Telephone Encounter (Signed)
Copied from CRM [id number]. Topic: Quick Communication - Rx Refill/Question >> Dec 30, 2019  3:27 PM Jaquita Rector A wrote: Medication: glucose blood (ACCU-CHEK AVIVA PLUS) test strip  Has the patient contacted their pharmacy? Yes.   (Agent: If no, request that the patient contact the pharmacy for the refill.) (Agent: If yes, when and what did the pharmacy advise?)  Preferred Pharmacy (with phone number or street name): [id number] [place], [place] - [address]  Phone:  [phone] Fax:  [phone]     Agent: Please be advised that RX refills may take up to 3 business days. We ask that you follow-up with your pharmacy.

## 2020-01-01 ENCOUNTER — Other Ambulatory Visit: Payer: Self-pay | Admitting: Family Medicine

## 2020-01-01 MED ORDER — ATORVASTATIN CALCIUM 40 MG PO TABS: 40.0000 mg | ORAL_TABLET | Freq: Every day | ORAL | 3 refills | Status: DC

## 2020-01-11 ENCOUNTER — Other Ambulatory Visit: Payer: Self-pay | Admitting: Family Medicine

## 2020-01-11 DIAGNOSIS — E1165 Type 2 diabetes mellitus with hyperglycemia: Secondary | ICD-10-CM

## 2020-01-11 MED ORDER — ACCU-CHEK AVIVA PLUS VI STRP: ORAL_STRIP | 3 refills | Status: DC

## 2020-01-11 NOTE — Telephone Encounter (Signed)
Change of pharmacy Requested Prescriptions  Pending Prescriptions Disp Refills  . glucose blood (ACCU-CHEK AVIVA PLUS) test strip 100 each 3    Sig: Use to test blood glucose daily.     Endocrinology: Diabetes - Testing Supplies Passed - 01/11/2020 12:46 PM      Passed - Valid encounter within last 12 months    Recent Outpatient Visits          4 weeks ago Type 2 diabetes mellitus with hyperglycemia, without long-term current use of insulin [facility])   Primary Care at [facility], Meda Coffee, MD   4 months ago Type 2 diabetes mellitus with hyperglycemia, without long-term current use of insulin [facility])   [facility], Meda Coffee, MD   11 months ago Type 2 diabetes mellitus with hyperglycemia, without long-term current use of insulin [facility])   [facility] at [facility], Meda Coffee, MD   1 year ago Visit for laboratory test   Primary Care at [facility], [place] A, MD   1 year ago Type 2 diabetes mellitus with hyperglycemia, without long-term current use of insulin [facility])   [facility] at [facility], Meda Coffee, MD      Future Appointments            In 2 months Myles Lipps, MD Primary Care at [place], [facility]

## 2020-01-11 NOTE — Telephone Encounter (Signed)
Medication: glucose blood (ACCU-CHEK AVIVA PLUS) test strip [[id number]] script was calling in. However, the patient is requesting the pharmacy to be changed to the below pharmacy.  Has the patient contacted their pharmacy? Yes  (Agent: If no, request that the patient contact the pharmacy for the refill.) (Agent: If yes, when and what did the pharmacy advise?)  Preferred Pharmacy (with phone number or street name):[facility] [address].  Agent: Please be advised that RX refills may take up to 3 business days. We ask that you follow-up with your pharmacy.

## 2020-01-13 ENCOUNTER — Telehealth: Payer: Self-pay | Admitting: Family Medicine

## 2020-01-13 NOTE — Telephone Encounter (Signed)
Pt called and stated the  atorvastatin (LIPITOR) 40 MG tablet [[id number]] medication is not working for him. Pt would like provider to give him a call regarding this. [phone] Please advise.

## 2020-01-13 NOTE — Telephone Encounter (Signed)
Patient called stating he tried taking atorvastatin for about 2 days and it caused him needle like head pain , as well as aches all over body. Please advice

## 2020-01-14 MED ORDER — ROSUVASTATIN CALCIUM 10 MG PO TABS: 10.0000 mg | ORAL_TABLET | Freq: Every day | ORAL | 3 refills | Status: DC

## 2020-01-14 NOTE — Telephone Encounter (Signed)
Patient was informed below msg 

## 2020-01-14 NOTE — Telephone Encounter (Signed)
Please let patient know I am sorry he seemed to have side effects. I have sent in a prescription for a more tolerable cholesterol medication. However if this still causes side effects, I would try cutting it in half or taking every other day before dismissing completely. thanks

## 2020-03-14 ENCOUNTER — Ambulatory Visit: Payer: 59 | Admitting: Family Medicine

## 2020-03-15 ENCOUNTER — Encounter: Payer: Self-pay | Admitting: Family Medicine

## 2020-04-07 ENCOUNTER — Ambulatory Visit (INDEPENDENT_AMBULATORY_CARE_PROVIDER_SITE_OTHER): Payer: 59 | Admitting: Family Medicine

## 2020-04-07 ENCOUNTER — Other Ambulatory Visit: Payer: Self-pay

## 2020-04-07 ENCOUNTER — Encounter: Payer: Self-pay | Admitting: Family Medicine

## 2020-04-07 DIAGNOSIS — E1165 Type 2 diabetes mellitus with hyperglycemia: Secondary | ICD-10-CM | POA: Diagnosis not present

## 2020-04-07 DIAGNOSIS — E1169 Type 2 diabetes mellitus with other specified complication: Secondary | ICD-10-CM | POA: Diagnosis not present

## 2020-04-07 DIAGNOSIS — E785 Hyperlipidemia, unspecified: Secondary | ICD-10-CM | POA: Diagnosis not present

## 2020-04-07 DIAGNOSIS — Z23 Encounter for immunization: Secondary | ICD-10-CM

## 2020-04-07 LAB — POCT GLYCOSYLATED HEMOGLOBIN (HGB A1C): Hemoglobin A1C: 8.8 % — AB (ref 4.0–5.6)

## 2020-04-07 MED ORDER — PIOGLITAZONE HCL 15 MG PO TABS: 15.0000 mg | ORAL_TABLET | Freq: Every day | ORAL | 1 refills | Status: DC

## 2020-04-07 MED ORDER — GLIPIZIDE ER 10 MG PO TB24: ORAL_TABLET | ORAL | 1 refills | Status: DC

## 2020-04-07 NOTE — Progress Notes (Signed)
8/19/202110:43 AM  David Strong 1955-09-14, 64 y.o., male [id number]  Chief Complaint  Patient presents with  . Diabetes  . Hyperlipidemia    HPI:   Patient is a 64 y.o. male with past medical history significant for DM2 and HLP who presents today for routine followup  Last ov April 2021 - started crestor - tolerating well  He is overall doing well He checks his cbgs every day fasting, today 127 He continues to work on cutting back on portions Continues to drink Guinness Declines foot exam - denies any numbness, tingling, pain  Wt Readings from Last 3 Encounters:  04/07/20 186 lb 3.2 oz (84.5 kg)  12/14/19 187 lb (84.8 kg)  01/29/19 189 lb 6.4 oz (85.9 kg)   Lab Results  Component Value Date   HGBA1C 8.5 (A) 12/14/2019   HGBA1C 10.1 (H) 01/29/2019   HGBA1C 11.2 (H) 11/14/2018   Lab Results  Component Value Date   LDLCALC 115 (H) 12/14/2019   CREATININE 1.09 12/14/2019     BP Readings from Last 3 Encounters:  04/07/20 122/84  12/14/19 121/82  01/29/19 129/83    Depression screen PHQ 2/9 12/14/2019 08/27/2019 01/29/2019  Decreased Interest 0 0 0  Down, Depressed, Hopeless 0 0 0  PHQ - 2 Score 0 0 0    Fall Risk  12/14/2019 08/27/2019 01/29/2019 11/12/2018 11/18/2017  Falls in the past year? 0 0 0 0 No  Number falls in past yr: 0 0 0 0 -  Injury with Fall? 0 0 0 0 -  Follow up Falls evaluation completed Falls evaluation completed Falls evaluation completed Falls evaluation completed -     Allergies  Allergen Reactions  . Codeine Itching  . Glipizide Er [Glipizide]     SOB, malaise. He tolerates XL without issues.  . Metformin And Related Rash    Prior to Admission medications   Medication Sig Start Date End Date Taking? Authorizing Provider  atorvastatin (LIPITOR) 40 MG tablet Take 40 mg by mouth daily. 03/31/20   [provider]  glipiZIDE (GLIPIZIDE XL) 10 MG 24 hr tablet TAKE 1 TABLET (10 MG TOTAL) BY MOUTH 2 (TWO) TIMES DAILY. TAKE WITH  BREAKFAST AND DINNER 12/14/19   Rutherford Guys, MD  glucose blood (ACCU-CHEK AVIVA PLUS) test strip Use to test blood glucose daily. 01/11/20   Rutherford Guys, MD  rosuvastatin (CRESTOR) 10 MG tablet Take 1 tablet (10 mg total) by mouth daily. 01/14/20   Rutherford Guys, MD    Past Medical History:  Diagnosis Date  . Diabetes mellitus without complication ([place])     No past surgical history on file.  Social History   Tobacco Use  . Smoking status: Former Research scientist (life sciences)  . Smokeless tobacco: Never Used  Substance Use Topics  . Alcohol use: Yes    Comment: occasional    No family history on file.  Review of Systems  Constitutional: Negative for chills and fever.  Respiratory: Negative for cough and shortness of breath.   Cardiovascular: Negative for chest pain, palpitations and leg swelling.  Gastrointestinal: Negative for abdominal pain, nausea and vomiting.  Neurological: Negative for tingling.     OBJECTIVE:  Today's Vitals   04/07/20 1038  BP: 122/84  Pulse: 76  Temp: 98 F (36.7 C)  SpO2: 99%  Weight: 186 lb 3.2 oz (84.5 kg)  Height: _0  (1.626 m)   Body mass index is 31.96 kg/m.   Physical Exam Vitals and nursing note reviewed.  Constitutional:  Appearance: He is well-developed.  HENT:     Head: Normocephalic and atraumatic.  Eyes:     Extraocular Movements: Extraocular movements intact.     Conjunctiva/sclera: Conjunctivae normal.     Pupils: Pupils are equal, round, and reactive to light.  Cardiovascular:     Rate and Rhythm: Normal rate and regular rhythm.     Heart sounds: No murmur heard.  No friction rub. No gallop.   Pulmonary:     Effort: Pulmonary effort is normal.     Breath sounds: Normal breath sounds. No wheezing, rhonchi or rales.  Musculoskeletal:     Cervical back: Neck supple.     Right lower leg: No edema.     Left lower leg: No edema.  Skin:    General: Skin is warm and dry.  Neurological:     Mental Status: He is alert  and oriented to person, place, and time.        Results for orders placed or performed in visit on 04/07/20 (from the past 24 hour(s))  POCT glycosylated hemoglobin (Hb A1C)     Status: Abnormal   Collection Time: 04/07/20 11:10 AM  Result Value Ref Range   Hemoglobin A1C 8.8 (A) 4.0 - 5.6 %   HbA1c POC (<> result, manual entry)     HbA1c, POC (prediabetic range)     HbA1c, POC (controlled diabetic range)      No results found.   ASSESSMENT and PLAN  1. Type 2 diabetes mellitus with hyperglycemia, without long-term current use of insulin (HCC) Not controlled. Adding actos, reviewed r/se/b. discussed LFM - Lipid panel - CMP14+EGFR - POCT glycosylated hemoglobin (Hb A1C) - Microalbumin / creatinine urine ratio - Ambulatory referral to Ophthalmology - glipiZIDE (GLIPIZIDE XL) 10 MG 24 hr tablet; TAKE 1 TABLET (10 MG TOTAL) BY MOUTH 2 (TWO) TIMES DAILY. TAKE WITH BREAKFAST AND DINNER  2. Hyperlipidemia associated with type 2 diabetes mellitus ([place]) Checking labs today, medications will be adjusted as needed.  - Lipid panel - CMP14+EGFR  Other orders - pioglitazone (ACTOS) 15 MG tablet; Take 1 tablet (15 mg total) by mouth daily.  Return in about 3 months (around 07/08/2020).    Rutherford Guys, MD Primary Care at [place], [place] [address] Ph.  [phone] Fax [phone]

## 2020-04-07 NOTE — Patient Instructions (Signed)
° ° ° °  If you have lab work done today you will be contacted with your lab results within the next 2 weeks.  If you have not heard from us then please contact us. The fastest way to get your results is to register for My Chart. ° ° °IF you received an x-ray today, you will receive an invoice from [facility]. Please contact [facility] at [phone] with questions or concerns regarding your invoice.  ° °IF you received labwork today, you will receive an invoice from [facility]. Please contact [facility] at [phone] with questions or concerns regarding your invoice.  ° °Our billing staff will not be able to assist you with questions regarding bills from these companies. ° °You will be contacted with the lab results as soon as they are available. The fastest way to get your results is to activate your My Chart account. Instructions are located on the last page of this paperwork. If you have not heard from us regarding the results in 2 weeks, please contact this office. °  ° ° ° °

## 2020-04-08 LAB — MICROALBUMIN / CREATININE URINE RATIO
Creatinine, Urine: 164.9 mg/dL
Microalb/Creat Ratio: 2 mg/g creat (ref 0–29)
Microalbumin, Urine: 3.6 ug/mL

## 2020-04-08 LAB — CMP14+EGFR
ALT: 38 IU/L (ref 0–44)
AST: 47 IU/L — ABNORMAL HIGH (ref 0–40)
Albumin/Globulin Ratio: 2 (ref 1.2–2.2)
Albumin: 4.7 g/dL (ref 3.8–4.8)
Alkaline Phosphatase: 58 IU/L (ref 48–121)
BUN/Creatinine Ratio: 18 (ref 10–24)
BUN: 19 mg/dL (ref 8–27)
Bilirubin Total: 0.8 mg/dL (ref 0.0–1.2)
CO2: 18 mmol/L — ABNORMAL LOW (ref 20–29)
Calcium: 9.5 mg/dL (ref 8.6–10.2)
Chloride: 103 mmol/L (ref 96–106)
Creatinine, Ser: 1.05 mg/dL (ref 0.76–1.27)
GFR calc Af Amer: 87 mL/min/{1.73_m2} (ref >59)
GFR calc non Af Amer: 75 mL/min/{1.73_m2} (ref >59)
Globulin, Total: 2.3 g/dL (ref 1.5–4.5)
Glucose: 203 mg/dL — ABNORMAL HIGH (ref 65–99)
Potassium: 5.4 mmol/L — ABNORMAL HIGH (ref 3.5–5.2)
Sodium: 141 mmol/L (ref 134–144)
Total Protein: 7 g/dL (ref 6.0–8.5)

## 2020-04-08 LAB — LIPID PANEL
Chol/HDL Ratio: 2.9 ratio (ref 0.0–5.0)
Cholesterol, Total: 137 mg/dL (ref 100–199)
HDL: 47 mg/dL (ref >39)
LDL Chol Calc (NIH): 75 mg/dL (ref 0–99)
Triglycerides: 79 mg/dL (ref 0–149)
VLDL Cholesterol Cal: 15 mg/dL (ref 5–40)

## 2020-05-02 ENCOUNTER — Encounter: Payer: Self-pay | Admitting: Radiology

## 2020-05-11 ENCOUNTER — Telehealth: Payer: Self-pay | Admitting: *Deleted

## 2020-05-11 NOTE — Telephone Encounter (Signed)
Noted. Thanks.

## 2020-05-11 NOTE — Telephone Encounter (Addendum)
Patient called about lab results from 04/07/20; he would like for them to be explained to him; reviewed notes per Dr Leretha Pol with patient, "Cholesterol is at goal; Glucose is high as expected per A1c; Normal kidney function. Minimal elevation in potassium most likely from hemolyzed blood. Will recheck at next OV; Mild elevation in AST - we have discussed cutting back alcohol use.No protein in urine, which is normal."; the patient inquired about why he needed to take the medication if his values were better; explained that values are more within the normal range because he is on the appropriate medication; he states he will continue to take the medication and discuss it with the provider at hs 3 onth appt; the patient states that he would like to change his PCP to Dr Alvy Bimler; he would like to be scheduled for his 3 month follow up appt; will route to office for final disposition.

## 2020-12-01 NOTE — Telephone Encounter (Signed)
openrd in error

## 2021-03-06 ENCOUNTER — Telehealth: Payer: Self-pay | Admitting: Emergency Medicine

## 2021-03-06 DIAGNOSIS — E1165 Type 2 diabetes mellitus with hyperglycemia: Secondary | ICD-10-CM

## 2021-03-06 NOTE — Telephone Encounter (Signed)
Patient scheduled as a transfer of care  on 03/28/21.  Patient needs: glipiZIDE (GLIPIZIDE XL) 10 MG 24 hr tablet  [id number] [place] [place] - [address] Phone:  [phone]  Fax:  [phone]     Patient is out of medication, please refill and contact the patient

## 2021-03-07 MED ORDER — GLIPIZIDE ER 10 MG PO TB24: ORAL_TABLET | ORAL | 1 refills | Status: DC

## 2021-03-07 NOTE — Telephone Encounter (Signed)
Medication refilled

## 2021-03-09 ENCOUNTER — Telehealth: Payer: Self-pay | Admitting: Emergency Medicine

## 2021-03-09 DIAGNOSIS — E1165 Type 2 diabetes mellitus with hyperglycemia: Secondary | ICD-10-CM

## 2021-03-13 ENCOUNTER — Other Ambulatory Visit: Payer: Self-pay | Admitting: Emergency Medicine

## 2021-03-13 DIAGNOSIS — E1165 Type 2 diabetes mellitus with hyperglycemia: Secondary | ICD-10-CM

## 2021-03-14 NOTE — Telephone Encounter (Signed)
Follow up message  Re: Glipizide  [facility] Please send to [id number] - [place], [place] - [address] AT [facility] [address]

## 2021-03-14 NOTE — Telephone Encounter (Signed)
LVM informing pt that we are unable to refill his prescription if he has not yet been seen by a provider in our office; encouraged to contact previous provider for short refill since [facility] appt with Dr Alvy Bimler is on 03/28/21.

## 2021-03-15 ENCOUNTER — Telehealth: Payer: Self-pay

## 2021-03-15 NOTE — Telephone Encounter (Signed)
Patient called to see the hold up on his glipizide refill. Patient was notified that refill had be sent and confirmed that the pharmacy received it 03/13/2021 at 5:59.  Patient notified that the office would call and see what could be done.  Pharmacy stated they only have 20 pills and they're waiting on a truck. Asked the pharmacist on shift if they could go ahead and give it to the patient patient pick up remainder when truck arrives. Pharmacist stated yes!  LDVM for patient.

## 2021-03-28 ENCOUNTER — Ambulatory Visit (INDEPENDENT_AMBULATORY_CARE_PROVIDER_SITE_OTHER): Payer: 59 | Admitting: Emergency Medicine

## 2021-03-28 ENCOUNTER — Encounter (INDEPENDENT_AMBULATORY_CARE_PROVIDER_SITE_OTHER): Payer: Self-pay

## 2021-03-28 ENCOUNTER — Encounter: Payer: Self-pay | Admitting: Emergency Medicine

## 2021-03-28 ENCOUNTER — Other Ambulatory Visit: Payer: Self-pay

## 2021-03-28 VITALS — BP 140/82 | HR 62 | Ht 64.0 in | Wt 181.0 lb

## 2021-03-28 DIAGNOSIS — Z7689 Persons encountering health services in other specified circumstances: Secondary | ICD-10-CM | POA: Diagnosis not present

## 2021-03-28 DIAGNOSIS — Z1211 Encounter for screening for malignant neoplasm of colon: Secondary | ICD-10-CM | POA: Diagnosis not present

## 2021-03-28 DIAGNOSIS — E1165 Type 2 diabetes mellitus with hyperglycemia: Secondary | ICD-10-CM

## 2021-03-28 LAB — COMPREHENSIVE METABOLIC PANEL
ALT: 20 U/L (ref 0–53)
AST: 20 U/L (ref 0–37)
Albumin: 4.3 g/dL (ref 3.5–5.2)
Alkaline Phosphatase: 68 U/L (ref 39–117)
BUN: 11 mg/dL (ref 6–23)
CO2: 29 mEq/L (ref 19–32)
Calcium: 9.1 mg/dL (ref 8.4–10.5)
Chloride: 98 mEq/L (ref 96–112)
Creatinine, Ser: 0.99 mg/dL (ref 0.40–1.50)
GFR: 80.36 mL/min (ref >60.00)
Glucose, Bld: 396 mg/dL — ABNORMAL HIGH (ref 70–99)
Potassium: 3.9 mEq/L (ref 3.5–5.1)
Sodium: 136 mEq/L (ref 135–145)
Total Bilirubin: 0.9 mg/dL (ref 0.2–1.2)
Total Protein: 6.8 g/dL (ref 6.0–8.3)

## 2021-03-28 LAB — POCT GLYCOSYLATED HEMOGLOBIN (HGB A1C): Hemoglobin A1C: 11.4 % — AB (ref 4.0–5.6)

## 2021-03-28 LAB — LIPID PANEL
Cholesterol: 176 mg/dL (ref 0–200)
HDL: 48.3 mg/dL (ref >39.00)
NonHDL: 127.7
Total CHOL/HDL Ratio: 4
Triglycerides: 201 mg/dL — ABNORMAL HIGH (ref 0.0–149.0)
VLDL: 40.2 mg/dL — ABNORMAL HIGH (ref 0.0–40.0)

## 2021-03-28 LAB — LDL CHOLESTEROL, DIRECT: Direct LDL: 116 mg/dL

## 2021-03-28 MED ORDER — TRULICITY 0.75 MG/0.5ML ~~LOC~~ SOAJ: 0.7500 mg | SUBCUTANEOUS | 3 refills | Status: DC

## 2021-03-28 MED ORDER — GLIPIZIDE ER 10 MG PO TB24: 10.0000 mg | ORAL_TABLET | Freq: Two times a day (BID) | ORAL | 3 refills | Status: DC

## 2021-03-28 NOTE — Assessment & Plan Note (Signed)
Uncontrolled diabetes with hemoglobin A1c at 11.4. Has been taking glipizide XL 10 mg twice a day. Diet and nutrition discussed advised to decrease amount of daily carbohydrate intake. Will start Trulicity 0.75 mg weekly.  Demonstration pen used. Continue rosuvastatin 10 mg daily. Follow-up in 3 months.

## 2021-03-28 NOTE — Patient Instructions (Signed)
Continue glipizide XL 10 mg twice a day Start weekly Trulicity of 0.75 mg Follow-up in 3 months Diabetes Mellitus and Nutrition, Adult When you have diabetes, or diabetes mellitus, it is very important to have healthy eating habits because your blood sugar (glucose) levels are greatly affected by what you eat and drink. Eating healthy foods in the right amounts, at about the same times every day, can help you: Control your blood glucose. Lower your risk of heart disease. Improve your blood pressure. Reach or maintain a healthy weight. What can affect my meal plan? Every person with diabetes is different, and each person has different needs for a meal plan. Your health care provider may recommend that you work with a dietitian to make a meal plan that is best for you. Your meal plan may vary depending on factors such as: The calories you need. The medicines you take. Your weight. Your blood glucose, blood pressure, and cholesterol levels. Your activity level. Other health conditions you have, such as heart or kidney disease. How do carbohydrates affect me? Carbohydrates, also called carbs, affect your blood glucose level more than any other type of food. Eating carbs naturally raises the amount of glucose in your blood. Carb counting is a method for keeping track of how many carbs you eat. Counting carbs is important to keep your blood glucose at a healthy level,especially if you use insulin or take certain oral diabetes medicines. It is important to know how many carbs you can safely have in each meal. This is different for every person. Your dietitian can help you calculate how manycarbs you should have at each meal and for each snack. How does alcohol affect me? Alcohol can cause a sudden decrease in blood glucose (hypoglycemia), especially if you use insulin or take certain oral diabetes medicines. Hypoglycemia can be a life-threatening condition. Symptoms of hypoglycemia, such as sleepiness,  dizziness, and confusion, are similar to symptoms of having too much alcohol. Do not drink alcohol if: Your health care provider tells you not to drink. You are pregnant, may be pregnant, or are planning to become pregnant. If you drink alcohol: Do not drink on an empty stomach. Limit how much you use to: 0-1 drink a day for women. 0-2 drinks a day for men. Be aware of how much alcohol is in your drink. In the [place] one drink equals one 12 oz bottle of beer (355 mL), one 5 oz glass of wine (148 mL), or one 1 oz glass of hard liquor (44 mL). Keep yourself hydrated with water, diet soda, or unsweetened iced tea. Keep in mind that regular soda, juice, and other mixers may contain a lot of sugar and must be counted as carbs. What are tips for following this plan?  Reading food labels Start by checking the serving size on the "Nutrition Facts" label of packaged foods and drinks. The amount of calories, carbs, fats, and other nutrients listed on the label is based on one serving of the item. Many items contain more than one serving per package. Check the total grams (g) of carbs in one serving. You can calculate the number of servings of carbs in one serving by dividing the total carbs by 15. For example, if a food has 30 g of total carbs per serving, it would be equal to 2 servings of carbs. Check the number of grams (g) of saturated fats and trans fats in one serving. Choose foods that have a low amount or none of these  fats. Check the number of milligrams (mg) of salt (sodium) in one serving. Most people should limit total sodium intake to less than 2,300 mg per day. Always check the nutrition information of foods labeled as "low-fat" or "nonfat." These foods may be higher in added sugar or refined carbs and should be avoided. Talk to your dietitian to identify your daily goals for nutrients listed on the label. Shopping Avoid buying canned, pre-made, or processed foods. These foods tend to be  high in fat, sodium, and added sugar. Shop around the outside edge of the grocery store. This is where you will most often find fresh fruits and vegetables, bulk grains, fresh meats, and fresh dairy. Cooking Use low-heat cooking methods, such as baking, instead of high-heat cooking methods like deep frying. Cook using healthy oils, such as olive, canola, or sunflower oil. Avoid cooking with butter, cream, or high-fat meats. Meal planning Eat meals and snacks regularly, preferably at the same times every day. Avoid going long periods of time without eating. Eat foods that are high in fiber, such as fresh fruits, vegetables, beans, and whole grains. Talk with your dietitian about how many servings of carbs you can eat at each meal. Eat 4-6 oz (112-168 g) of lean protein each day, such as lean meat, chicken, fish, eggs, or tofu. One ounce (oz) of lean protein is equal to: 1 oz (28 g) of meat, chicken, or fish. 1 egg.  cup (62 g) of tofu. Eat some foods each day that contain healthy fats, such as avocado, nuts, seeds, and fish. What foods should I eat? Fruits Berries. Apples. Oranges. Peaches. Apricots. Plums. Grapes. Mango. Papaya.Pomegranate. Kiwi. Cherries. Vegetables Lettuce. Spinach. Leafy greens, including kale, chard, collard greens, and mustard greens. Beets. Cauliflower. Cabbage. Broccoli. Carrots. Green beans.Tomatoes. Peppers. Onions. Cucumbers. Brussels sprouts. Grains Whole grains, such as whole-wheat or whole-grain bread, crackers, tortillas,cereal, and pasta. Unsweetened oatmeal. Quinoa. Brown or wild rice. Meats and other proteins Seafood. Poultry without skin. Lean cuts of poultry and beef. Tofu. Nuts. Seeds. Dairy Low-fat or fat-free dairy products such as milk, yogurt, and cheese. The items listed above may not be a complete list of foods and beverages you can eat. Contact a dietitian for more information. What foods should I avoid? Fruits Fruits canned with  syrup. Vegetables Canned vegetables. Frozen vegetables with butter or cream sauce. Grains Refined white flour and flour products such as bread, pasta, snack foods, andcereals. Avoid all processed foods. Meats and other proteins Fatty cuts of meat. Poultry with skin. Breaded or fried meats. Processed meat.Avoid saturated fats. Dairy Full-fat yogurt, cheese, or milk. Beverages Sweetened drinks, such as soda or iced tea. The items listed above may not be a complete list of foods and beverages you should avoid. Contact a dietitian for more information. Questions to ask a health care provider Do I need to meet with a diabetes educator? Do I need to meet with a dietitian? What number can I call if I have questions? When are the best times to check my blood glucose? Where to find more information: American Diabetes Association: diabetes.org Academy of Nutrition and Dietetics: www.eatright.[employer] of Diabetes and Digestive and Kidney Diseases: CarFlippers.tn Association of Diabetes Care and Education Specialists: www.diabeteseducator.org Summary It is important to have healthy eating habits because your blood sugar (glucose) levels are greatly affected by what you eat and drink. A healthy meal plan will help you control your blood glucose and maintain a healthy lifestyle. Your health care provider may recommend  that you work with a dietitian to make a meal plan that is best for you. Keep in mind that carbohydrates (carbs) and alcohol have immediate effects on your blood glucose levels. It is important to count carbs and to use alcohol carefully. This information is not intended to replace advice given to you by your health care provider. Make sure you discuss any questions you have with your healthcare provider. Document Revised: 07/14/2019 Document Reviewed: 07/14/2019 Elsevier Patient Education  2021 [employer].

## 2021-03-28 NOTE — Progress Notes (Signed)
David Strong 65 y.o.   Chief Complaint  Patient presents with   Transitions Of Care    Medication refill glipizide xl    HISTORY OF PRESENT ILLNESS: This is a 65 y.o. male former patient of Dr. Leretha Pol here to establish care with me. Has history of diabetes and has been taking glipizide XL 10 mg twice a day. Also taking rosuvastatin 10 mg daily. Has no complaints or medical concerns today.  HPI   Prior to Admission medications   Medication Sig Start Date End Date Taking? Authorizing Provider  glipiZIDE (GLIPIZIDE XL) 10 MG 24 hr tablet Take 1 tablet (10 mg total) by mouth daily with breakfast. 03/13/21 04/12/21 Yes Tam Savoia, Eilleen Kempf, MD  glucose blood (ACCU-CHEK AVIVA PLUS) test strip Use to test blood glucose daily. 01/11/20  Yes Lezlie Lye, Meda Coffee, MD  pioglitazone (ACTOS) 15 MG tablet Take 1 tablet (15 mg total) by mouth daily. 04/07/20  Yes Lezlie Lye, Meda Coffee, MD  rosuvastatin (CRESTOR) 10 MG tablet Take 1 tablet (10 mg total) by mouth daily. 01/14/20  Yes Lezlie Lye, Meda Coffee, MD    Allergies  Allergen Reactions   Codeine Itching   Glipizide Er [Glipizide]     SOB, malaise. He tolerates XL without issues.   Metformin And Related Rash    Patient Active Problem List   Diagnosis Date Noted   Type 2 diabetes mellitus (HCC) 06/07/2015   Glucose intolerance (impaired glucose tolerance) 08/03/2012   Leucopenia 08/03/2012   Thrombocytopenia (HCC) 08/03/2012    Past Medical History:  Diagnosis Date   Diabetes mellitus without complication (HCC)     History reviewed. No pertinent surgical history.  Social History   Socioeconomic History   Marital status: Married    Spouse name: Not on file   Number of children: Not on file   Years of education: Not on file   Highest education level: Not on file  Occupational History   Not on file  Tobacco Use   Smoking status: Former   Smokeless tobacco: Never  Substance and Sexual Activity   Alcohol use: Yes     Comment: occasional   Drug use: No   Sexual activity: Yes  Other Topics Concern   Not on file  Social History Narrative   Not on file   Social Determinants of Health   Financial Resource Strain: Not on file  Food Insecurity: Not on file  Transportation Needs: Not on file  Physical Activity: Not on file  Stress: Not on file  Social Connections: Not on file  Intimate Partner Violence: Not on file    Family History  Adopted: Yes     Review of Systems  Constitutional: Negative.  Negative for chills and fever.  HENT: Negative.  Negative for congestion and sore throat.   Respiratory: Negative.  Negative for cough and shortness of breath.   Cardiovascular: Negative.  Negative for chest pain and palpitations.  Gastrointestinal:  Negative for abdominal pain, diarrhea, nausea and vomiting.  Genitourinary: Negative.  Negative for dysuria and hematuria.  Skin: Negative.  Negative for rash.  Neurological: Negative.  Negative for dizziness and headaches.  All other systems reviewed and are negative.  Today's Vitals   03/28/21 1115  BP: 140/82  Pulse: 62  SpO2: 98%  Weight: 181 lb (82.1 kg)  Height: 5\' 4"  (1.626 m)   Body mass index is 31.07 kg/m.  Physical Exam Vitals reviewed.  Constitutional:      Appearance: Normal appearance.  HENT:  Head: Normocephalic.  Eyes:     Extraocular Movements: Extraocular movements intact.     Conjunctiva/sclera: Conjunctivae normal.     Pupils: Pupils are equal, round, and reactive to light.  Cardiovascular:     Rate and Rhythm: Normal rate and regular rhythm.     Pulses: Normal pulses.     Heart sounds: Normal heart sounds.  Pulmonary:     Effort: Pulmonary effort is normal.     Breath sounds: Normal breath sounds.  Abdominal:     Palpations: Abdomen is soft.     Tenderness: There is no abdominal tenderness.  Musculoskeletal:        General: Normal range of motion.     Cervical back: Normal range of motion and neck supple. No  tenderness.  Skin:    General: Skin is warm and dry.     Capillary Refill: Capillary refill takes less than 2 seconds.  Neurological:     General: No focal deficit present.     Mental Status: He is alert and oriented to person, place, and time.  Psychiatric:        Mood and Affect: Mood normal.        Behavior: Behavior normal.   Results for orders placed or performed in visit on 03/28/21 (from the past 24 hour(s))  POCT glycosylated hemoglobin (Hb A1C)     Status: Abnormal   Collection Time: 03/28/21 11:23 AM  Result Value Ref Range   Hemoglobin A1C 11.4 (A) 4.0 - 5.6 %   HbA1c POC (<> result, manual entry)     HbA1c, POC (prediabetic range)     HbA1c, POC (controlled diabetic range)       ASSESSMENT & PLAN: Type 2 diabetes mellitus (HCC) Uncontrolled diabetes with hemoglobin A1c at 11.4. Has been taking glipizide XL 10 mg twice a day. Diet and nutrition discussed advised to decrease amount of daily carbohydrate intake. Will start Trulicity 0.75 mg weekly.  Demonstration pen used. Continue rosuvastatin 10 mg daily. Follow-up in 3 months.  David Strong was seen today for transitions of care.  Diagnoses and all orders for this visit:  Type 2 diabetes mellitus with hyperglycemia, without long-term current use of insulin (HCC) -     POCT glycosylated hemoglobin (Hb A1C) -     Comprehensive metabolic panel -     Lipid panel -     Dulaglutide (TRULICITY) 0.75 MG/0.5ML SOPN; Inject 0.75 mg into the skin once a week. -     glipiZIDE (GLIPIZIDE XL) 10 MG 24 hr tablet; Take 1 tablet (10 mg total) by mouth 2 (two) times daily with a meal.  Encounter to establish care  Colon cancer screening -     Cologuard   Patient Instructions  Continue glipizide XL 10 mg twice a day Start weekly Trulicity of 0.75 mg Follow-up in 3 months Diabetes Mellitus and Nutrition, Adult When you have diabetes, or diabetes mellitus, it is very important to have healthy eating habits because your blood sugar  (glucose) levels are greatly affected by what you eat and drink. Eating healthy foods in the right amounts, at about the same times every day, can help you: Control your blood glucose. Lower your risk of heart disease. Improve your blood pressure. Reach or maintain a healthy weight. What can affect my meal plan? Every person with diabetes is different, and each person has different needs for a meal plan. Your health care provider may recommend that you work with a dietitian to make a meal plan  that is best for you. Your meal plan may vary depending on factors such as: The calories you need. The medicines you take. Your weight. Your blood glucose, blood pressure, and cholesterol levels. Your activity level. Other health conditions you have, such as heart or kidney disease. How do carbohydrates affect me? Carbohydrates, also called carbs, affect your blood glucose level more than any other type of food. Eating carbs naturally raises the amount of glucose in your blood. Carb counting is a method for keeping track of how many carbs you eat. Counting carbs is important to keep your blood glucose at a healthy level,especially if you use insulin or take certain oral diabetes medicines. It is important to know how many carbs you can safely have in each meal. This is different for every person. Your dietitian can help you calculate how manycarbs you should have at each meal and for each snack. How does alcohol affect me? Alcohol can cause a sudden decrease in blood glucose (hypoglycemia), especially if you use insulin or take certain oral diabetes medicines. Hypoglycemia can be a life-threatening condition. Symptoms of hypoglycemia, such as sleepiness, dizziness, and confusion, are similar to symptoms of having too much alcohol. Do not drink alcohol if: Your health care provider tells you not to drink. You are pregnant, may be pregnant, or are planning to become pregnant. If you drink alcohol: Do not  drink on an empty stomach. Limit how much you use to: 0-1 drink a day for women. 0-2 drinks a day for men. Be aware of how much alcohol is in your drink. In the [place] one drink equals one 12 oz bottle of beer (355 mL), one 5 oz glass of wine (148 mL), or one 1 oz glass of hard liquor (44 mL). Keep yourself hydrated with water, diet soda, or unsweetened iced tea. Keep in mind that regular soda, juice, and other mixers may contain a lot of sugar and must be counted as carbs. What are tips for following this plan?  Reading food labels Start by checking the serving size on the "Nutrition Facts" label of packaged foods and drinks. The amount of calories, carbs, fats, and other nutrients listed on the label is based on one serving of the item. Many items contain more than one serving per package. Check the total grams (g) of carbs in one serving. You can calculate the number of servings of carbs in one serving by dividing the total carbs by 15. For example, if a food has 30 g of total carbs per serving, it would be equal to 2 servings of carbs. Check the number of grams (g) of saturated fats and trans fats in one serving. Choose foods that have a low amount or none of these fats. Check the number of milligrams (mg) of salt (sodium) in one serving. Most people should limit total sodium intake to less than 2,300 mg per day. Always check the nutrition information of foods labeled as "low-fat" or "nonfat." These foods may be higher in added sugar or refined carbs and should be avoided. Talk to your dietitian to identify your daily goals for nutrients listed on the label. Shopping Avoid buying canned, pre-made, or processed foods. These foods tend to be high in fat, sodium, and added sugar. Shop around the outside edge of the grocery store. This is where you will most often find fresh fruits and vegetables, bulk grains, fresh meats, and fresh dairy. Cooking Use low-heat cooking methods, such as baking,  instead of high-heat cooking  methods like deep frying. Cook using healthy oils, such as olive, canola, or sunflower oil. Avoid cooking with butter, cream, or high-fat meats. Meal planning Eat meals and snacks regularly, preferably at the same times every day. Avoid going long periods of time without eating. Eat foods that are high in fiber, such as fresh fruits, vegetables, beans, and whole grains. Talk with your dietitian about how many servings of carbs you can eat at each meal. Eat 4-6 oz (112-168 g) of lean protein each day, such as lean meat, chicken, fish, eggs, or tofu. One ounce (oz) of lean protein is equal to: 1 oz (28 g) of meat, chicken, or fish. 1 egg.  cup (62 g) of tofu. Eat some foods each day that contain healthy fats, such as avocado, nuts, seeds, and fish. What foods should I eat? Fruits Berries. Apples. Oranges. Peaches. Apricots. Plums. Grapes. Mango. Papaya.Pomegranate. Kiwi. Cherries. Vegetables Lettuce. Spinach. Leafy greens, including kale, chard, collard greens, and mustard greens. Beets. Cauliflower. Cabbage. Broccoli. Carrots. Green beans.Tomatoes. Peppers. Onions. Cucumbers. Brussels sprouts. Grains Whole grains, such as whole-wheat or whole-grain bread, crackers, tortillas,cereal, and pasta. Unsweetened oatmeal. Quinoa. Brown or wild rice. Meats and other proteins Seafood. Poultry without skin. Lean cuts of poultry and beef. Tofu. Nuts. Seeds. Dairy Low-fat or fat-free dairy products such as milk, yogurt, and cheese. The items listed above may not be a complete list of foods and beverages you can eat. Contact a dietitian for more information. What foods should I avoid? Fruits Fruits canned with syrup. Vegetables Canned vegetables. Frozen vegetables with butter or cream sauce. Grains Refined white flour and flour products such as bread, pasta, snack foods, andcereals. Avoid all processed foods. Meats and other proteins Fatty cuts of meat. Poultry with  skin. Breaded or fried meats. Processed meat.Avoid saturated fats. Dairy Full-fat yogurt, cheese, or milk. Beverages Sweetened drinks, such as soda or iced tea. The items listed above may not be a complete list of foods and beverages you should avoid. Contact a dietitian for more information. Questions to ask a health care provider Do I need to meet with a diabetes educator? Do I need to meet with a dietitian? What number can I call if I have questions? When are the best times to check my blood glucose? Where to find more information: American Diabetes Association: diabetes.org Academy of Nutrition and Dietetics: www.eatright.[employer] of Diabetes and Digestive and Kidney Diseases: CarFlippers.tn Association of Diabetes Care and Education Specialists: www.diabeteseducator.org Summary It is important to have healthy eating habits because your blood sugar (glucose) levels are greatly affected by what you eat and drink. A healthy meal plan will help you control your blood glucose and maintain a healthy lifestyle. Your health care provider may recommend that you work with a dietitian to make a meal plan that is best for you. Keep in mind that carbohydrates (carbs) and alcohol have immediate effects on your blood glucose levels. It is important to count carbs and to use alcohol carefully. This information is not intended to replace advice given to you by your health care provider. Make sure you discuss any questions you have with your healthcare provider. Document Revised: 07/14/2019 Document Reviewed: 07/14/2019 Elsevier Patient Education  2021 Elsevier Inc.    Edwina Barth, MD  Primary Care at [facility]

## 2021-03-29 ENCOUNTER — Other Ambulatory Visit: Payer: Self-pay | Admitting: Emergency Medicine

## 2021-03-29 DIAGNOSIS — E1165 Type 2 diabetes mellitus with hyperglycemia: Secondary | ICD-10-CM

## 2021-04-13 ENCOUNTER — Telehealth: Payer: Self-pay | Admitting: Emergency Medicine

## 2021-04-13 NOTE — Telephone Encounter (Signed)
Patient calling in w/ concerns regarding rx Dulaglutide (TRULICITY) 0.75 MG/0.5ML   Says he has read reviews & some of the side effects were very alarming & concerning for him  Wants to know if possible to up dosage on rx glipiZIDE (GLIPIZIDE XL) 10 MG 24 hr tabletSOPN so that the Dulaglutide (TRULICITY) 0.75 MG/0.5ML is not needed  Please call patient [phone]

## 2021-04-15 NOTE — Telephone Encounter (Signed)
Highly recommend to start Trulicity.  Trulicity is safe and effective and his diabetes is very out of control.  He is already taking glipizide XL and must stay on current dose.

## 2021-04-19 NOTE — Telephone Encounter (Signed)
Called and spoke with pt, he states that he has been researching the side effect to Trulicity and thinks it is unsafe to take. Patient states that he would like to continue on the Glipizide but increase the times he takes it. He suggests taking 10 mg in the morning and 20mg  at night. Advise patient that Trulicity would be safe to take, but he dose not agree.

## 2021-04-19 NOTE — Telephone Encounter (Signed)
Nothing we can do if he does not want to take the Trulicity.  Glipizide maximum daily doses is 40 mg so it is okay to take to 20 mg at night.  Be cautious of hypoglycemia.  Thanks.

## 2021-04-20 NOTE — Telephone Encounter (Signed)
Called and spoke with pt about provider's recommendations and pt verb understanding.

## 2021-05-08 ENCOUNTER — Other Ambulatory Visit: Payer: Self-pay | Admitting: Emergency Medicine

## 2021-05-08 DIAGNOSIS — E1165 Type 2 diabetes mellitus with hyperglycemia: Secondary | ICD-10-CM

## 2021-05-08 MED ORDER — GLIPIZIDE ER 10 MG PO TB24: 20.0000 mg | ORAL_TABLET | Freq: Two times a day (BID) | ORAL | 1 refills | Status: DC

## 2021-05-08 NOTE — Telephone Encounter (Signed)
New prescription sent to pharmacy of record

## 2021-05-08 NOTE — Telephone Encounter (Signed)
Please advise as the pt has stated he has been taking 40mg  of the  Glipizide in which it has been helping him tremendously with his blood sugars. Pt states if you can plz change his rx to 20mg  2 times a day with the new rx refill for the  Glipizide.

## 2021-06-01 LAB — COLOGUARD: Cologuard: NEGATIVE

## 2021-06-07 ENCOUNTER — Telehealth: Payer: Self-pay | Admitting: Emergency Medicine

## 2021-06-07 DIAGNOSIS — E1165 Type 2 diabetes mellitus with hyperglycemia: Secondary | ICD-10-CM

## 2021-06-08 NOTE — Telephone Encounter (Signed)
Patient called stating the wrong rx was sent to pharmacy  Patient states pharmacy has rx for Glipizide ER   Patient states the correct rx needed is glipiZIDE (GLIPIZIDE XL) 10 MG 24 hr tablet  Patient states pharmacy has order for Glipizide ER  Pharmacy [id number] - [place], [place] - [address]

## 2021-06-08 NOTE — Telephone Encounter (Signed)
Called [facility] and spoke with pharmacist, she states that Glipizide ER and Glipizide XL are the same medication. Pt states that the ER version makes him sick. Pt states he will call pharmacy to make sure they give the XL instead of ER.

## 2021-06-21 NOTE — Telephone Encounter (Signed)
Patient calling in about rx for glipiZIDE (GLIPIZIDE XL) 10 MG 24 hr tablet  Patient says when he went to pick up rx in sept he was only given a quantity of 40 & not the full 360 & he is out  Patient says he contacted pharmacy & they advised him to contact us  Please call pharmacy & clarify  [id number] [place], [place] - [address] Phone:  [phone]  Fax:  [phone]

## 2021-06-23 NOTE — Telephone Encounter (Signed)
Called [facility] to find out why pt only relieved 40 tablets, spoke with someone at the pharmacy who states that they will refill medication for 90 days. Called and left vm for pt to make him aware of the change.

## 2021-06-28 ENCOUNTER — Ambulatory Visit: Payer: 59 | Admitting: Emergency Medicine

## 2021-07-11 ENCOUNTER — Encounter: Payer: Self-pay | Admitting: Emergency Medicine

## 2021-07-11 ENCOUNTER — Other Ambulatory Visit: Payer: Self-pay

## 2021-07-11 ENCOUNTER — Ambulatory Visit (INDEPENDENT_AMBULATORY_CARE_PROVIDER_SITE_OTHER): Payer: Medicare Other | Admitting: Emergency Medicine

## 2021-07-11 VITALS — BP 122/81 | HR 76 | Ht 64.0 in | Wt 181.0 lb

## 2021-07-11 DIAGNOSIS — N5201 Erectile dysfunction due to arterial insufficiency: Secondary | ICD-10-CM | POA: Diagnosis not present

## 2021-07-11 DIAGNOSIS — E1165 Type 2 diabetes mellitus with hyperglycemia: Secondary | ICD-10-CM

## 2021-07-11 LAB — POCT GLYCOSYLATED HEMOGLOBIN (HGB A1C): Hemoglobin A1C: 8.6 % — AB (ref 4.0–5.6)

## 2021-07-11 MED ORDER — SILDENAFIL CITRATE 100 MG PO TABS: 50.0000 mg | ORAL_TABLET | Freq: Every day | ORAL | 11 refills | Status: DC | PRN

## 2021-07-11 MED ORDER — DAPAGLIFLOZIN PROPANEDIOL 5 MG PO TABS: 5.0000 mg | ORAL_TABLET | Freq: Every day | ORAL | 3 refills | Status: AC

## 2021-07-11 NOTE — Progress Notes (Signed)
David Strong 65 y.o.   Chief Complaint  Patient presents with   Diabetes    3 month f/u    HISTORY OF PRESENT ILLNESS: This is a 65 y.o. male here for 20-month follow-up of diabetes. Eating better.  Glucose numbers at home much improved. Inquiring about erectile dysfunction and Viagra.  Diabetes Pertinent negatives for hypoglycemia include no dizziness or headaches. Pertinent negatives for diabetes include no chest pain.    Prior to Admission medications   Medication Sig Start Date End Date Taking? Authorizing Provider  Dulaglutide (TRULICITY) 0.75 MG/0.5ML SOPN Inject 0.75 mg into the skin once a week. 03/28/21  Yes [name], MD  glipiZIDE (GLIPIZIDE XL) 10 MG 24 hr tablet Take 2 tablets (20 mg total) by mouth 2 (two) times daily with a meal. 05/08/21 08/06/21 Yes Astaria Nanez, Eilleen Kempf, MD  glucose blood (ACCU-CHEK AVIVA PLUS) test strip Use to test blood glucose daily. 01/11/20  Yes Lezlie Lye, Meda Coffee, MD  rosuvastatin (CRESTOR) 10 MG tablet Take 1 tablet (10 mg total) by mouth daily. 01/14/20  Yes Lezlie Lye, Meda Coffee, MD    Allergies  Allergen Reactions   Codeine Itching   Glipizide Er [Glipizide]     SOB, malaise. He tolerates XL without issues.   Metformin And Related Rash    Patient Active Problem List   Diagnosis Date Noted   Type 2 diabetes mellitus (HCC) 06/07/2015   Glucose intolerance (impaired glucose tolerance) 08/03/2012   Leucopenia 08/03/2012   Thrombocytopenia (HCC) 08/03/2012    Past Medical History:  Diagnosis Date   Diabetes mellitus without complication (HCC)     History reviewed. No pertinent surgical history.  Social History   Socioeconomic History   Marital status: Married    Spouse name: Not on file   Number of children: Not on file   Years of education: Not on file   Highest education level: Not on file  Occupational History   Not on file  Tobacco Use   Smoking status: Former   Smokeless tobacco: Never  Substance and  Sexual Activity   Alcohol use: Yes    Comment: occasional   Drug use: No   Sexual activity: Yes  Other Topics Concern   Not on file  Social History Narrative   Not on file   Social Determinants of Health   Financial Resource Strain: Not on file  Food Insecurity: Not on file  Transportation Needs: Not on file  Physical Activity: Not on file  Stress: Not on file  Social Connections: Not on file  Intimate Partner Violence: Not on file    Family History  Adopted: Yes     Review of Systems  Constitutional: Negative.  Negative for chills and fever.  HENT: Negative.  Negative for congestion and sore throat.   Respiratory: Negative.  Negative for cough and shortness of breath.   Cardiovascular: Negative.  Negative for chest pain and palpitations.  Gastrointestinal: Negative.  Negative for abdominal pain, diarrhea, nausea and vomiting.  Genitourinary:  Negative for dysuria and hematuria.       Erectile dysfunction  Musculoskeletal: Negative.   Skin: Negative.  Negative for rash.  Neurological:  Negative for dizziness and headaches.  All other systems reviewed and are negative.  Vitals:   07/11/21 1322  BP: 122/81  Pulse: 76  SpO2: 97%   Wt Readings from Last 3 Encounters:  07/11/21 181 lb (82.1 kg)  03/28/21 181 lb (82.1 kg)  04/07/20 186 lb 3.2 oz (84.5 kg)  Physical Exam Vitals reviewed.  Constitutional:      Appearance: Normal appearance.  HENT:     Head: Normocephalic.  Eyes:     Extraocular Movements: Extraocular movements intact.     Pupils: Pupils are equal, round, and reactive to light.  Cardiovascular:     Rate and Rhythm: Normal rate and regular rhythm.     Pulses: Normal pulses.     Heart sounds: Normal heart sounds.  Pulmonary:     Effort: Pulmonary effort is normal.     Breath sounds: Normal breath sounds.  Abdominal:     Palpations: Abdomen is soft.     Tenderness: There is no abdominal tenderness.  Musculoskeletal:     Cervical back:  Normal range of motion.     Right lower leg: No edema.     Left lower leg: No edema.  Skin:    General: Skin is warm and dry.     Capillary Refill: Capillary refill takes less than 2 seconds.  Neurological:     General: No focal deficit present.     Mental Status: He is alert and oriented to person, place, and time.  Psychiatric:        Mood and Affect: Mood normal.        Behavior: Behavior normal.    Results for orders placed or performed in visit on 07/11/21 (from the past 24 hour(s))  POCT glycosylated hemoglobin (Hb A1C)     Status: Abnormal   Collection Time: 07/11/21  1:36 PM  Result Value Ref Range   Hemoglobin A1C 8.6 (A) 4.0 - 5.6 %   HbA1c POC (<> result, manual entry)     HbA1c, POC (prediabetic range)     HbA1c, POC (controlled diabetic range)      ASSESSMENT & PLAN: A total of 47 minutes was spent with the patient and counseling/coordination of care regarding preparing for this visit, review of most recent office visit notes, review of most recent blood work results including today's hemoglobin A1c, education on nutrition, review of all medications and changes made including addition of Farxiga 5 mg daily, hypoglycemia precautions, cardiovascular risks associated with diabetes, erectile dysfunction and proper use of Viagra, prognosis, documentation and need for follow-up in 3 months.  Problem List Items Addressed This Visit       Endocrine   Type 2 diabetes mellitus ([place]) - Primary    Much improved diabetes with improved hemoglobin A1c Lab Results  Component Value Date   HGBA1C 8.6 (A) 07/11/2021  Did not start taking Trulicity. Presently taking glipizide XL 20 mg twice a day. Intolerant to metformin. We will start Farxiga 5 mg daily.  Hypoglycemia precautions given.  Advised to reduce dose of glipizide if he starts noticing frequent low blood glucose numbers. Diet and nutrition discussed. Continue rosuvastatin 10 mg daily. Follow-up in 3 months.        Relevant Medications   dapagliflozin propanediol (FARXIGA) 5 MG TABS tablet   Other Relevant Orders   POCT glycosylated hemoglobin (Hb A1C) (Completed)   Other Visit Diagnoses     Erectile dysfunction due to arterial insufficiency       Relevant Medications   sildenafil (VIAGRA) 100 MG tablet      Patient Instructions  Diabetes Mellitus and Nutrition, Adult When you have diabetes, or diabetes mellitus, it is very important to have healthy eating habits because your blood sugar (glucose) levels are greatly affected by what you eat and drink. Eating healthy foods in the right amounts,  at about the same times every day, can help you: Manage your blood glucose. Lower your risk of heart disease. Improve your blood pressure. Reach or maintain a healthy weight. What can affect my meal plan? Every person with diabetes is different, and each person has different needs for a meal plan. Your health care provider may recommend that you work with a dietitian to make a meal plan that is best for you. Your meal plan may vary depending on factors such as: The calories you need. The medicines you take. Your weight. Your blood glucose, blood pressure, and cholesterol levels. Your activity level. Other health conditions you have, such as heart or kidney disease. How do carbohydrates affect me? Carbohydrates, also called carbs, affect your blood glucose level more than any other type of food. Eating carbs raises the amount of glucose in your blood. It is important to know how many carbs you can safely have in each meal. This is different for every person. Your dietitian can help you calculate how many carbs you should have at each meal and for each snack. How does alcohol affect me? Alcohol can cause a decrease in blood glucose (hypoglycemia), especially if you use insulin or take certain diabetes medicines by mouth. Hypoglycemia can be a life-threatening condition. Symptoms of hypoglycemia, such as  sleepiness, dizziness, and confusion, are similar to symptoms of having too much alcohol. Do not drink alcohol if: Your health care provider tells you not to drink. You are pregnant, may be pregnant, or are planning to become pregnant. If you drink alcohol: Limit how much you have to: 0-1 drink a day for women. 0-2 drinks a day for men. Know how much alcohol is in your drink. In the [place] one drink equals one 12 oz bottle of beer (355 mL), one 5 oz glass of wine (148 mL), or one 1 oz glass of hard liquor (44 mL). Keep yourself hydrated with water, diet soda, or unsweetened iced tea. Keep in mind that regular soda, juice, and other mixers may contain a lot of sugar and must be counted as carbs. What are tips for following this plan? Reading food labels Start by checking the serving size on the Nutrition Facts label of packaged foods and drinks. The number of calories and the amount of carbs, fats, and other nutrients listed on the label are based on one serving of the item. Many items contain more than one serving per package. Check the total grams (g) of carbs in one serving. Check the number of grams of saturated fats and trans fats in one serving. Choose foods that have a low amount or none of these fats. Check the number of milligrams (mg) of salt (sodium) in one serving. Most people should limit total sodium intake to less than 2,300 mg per day. Always check the nutrition information of foods labeled as "low-fat" or "nonfat." These foods may be higher in added sugar or refined carbs and should be avoided. Talk to your dietitian to identify your daily goals for nutrients listed on the label. Shopping Avoid buying canned, pre-made, or processed foods. These foods tend to be high in fat, sodium, and added sugar. Shop around the outside edge of the grocery store. This is where you will most often find fresh fruits and vegetables, bulk grains, fresh meats, and fresh dairy products. Cooking Use  low-heat cooking methods, such as baking, instead of high-heat cooking methods, such as deep frying. Cook using healthy oils, such as olive, canola, or sunflower  oil. Avoid cooking with butter, cream, or high-fat meats. Meal planning Eat meals and snacks regularly, preferably at the same times every day. Avoid going long periods of time without eating. Eat foods that are high in fiber, such as fresh fruits, vegetables, beans, and whole grains. Eat 4-6 oz (112-168 g) of lean protein each day, such as lean meat, chicken, fish, eggs, or tofu. One ounce (oz) (28 g) of lean protein is equal to: 1 oz (28 g) of meat, chicken, or fish. 1 egg.  cup (62 g) of tofu. Eat some foods each day that contain healthy fats, such as avocado, nuts, seeds, and fish. What foods should I eat? Fruits Berries. Apples. Oranges. Peaches. Apricots. Plums. Grapes. Mangoes. Papayas. Pomegranates. Kiwi. Cherries. Vegetables Leafy greens, including lettuce, spinach, kale, chard, collard greens, mustard greens, and cabbage. Beets. Cauliflower. Broccoli. Carrots. Green beans. Tomatoes. Peppers. Onions. Cucumbers. Brussels sprouts. Grains Whole grains, such as whole-wheat or whole-grain bread, crackers, tortillas, cereal, and pasta. Unsweetened oatmeal. Quinoa. Brown or wild rice. Meats and other proteins Seafood. Poultry without skin. Lean cuts of poultry and beef. Tofu. Nuts. Seeds. Dairy Low-fat or fat-free dairy products such as milk, yogurt, and cheese. The items listed above may not be a complete list of foods and beverages you can eat and drink. Contact a dietitian for more information. What foods should I avoid? Fruits Fruits canned with syrup. Vegetables Canned vegetables. Frozen vegetables with butter or cream sauce. Grains Refined white flour and flour products such as bread, pasta, snack foods, and cereals. Avoid all processed foods. Meats and other proteins Fatty cuts of meat. Poultry with skin. Breaded or  fried meats. Processed meat. Avoid saturated fats. Dairy Full-fat yogurt, cheese, or milk. Beverages Sweetened drinks, such as soda or iced tea. The items listed above may not be a complete list of foods and beverages you should avoid. Contact a dietitian for more information. Questions to ask a health care provider Do I need to meet with a certified diabetes care and education specialist? Do I need to meet with a dietitian? What number can I call if I have questions? When are the best times to check my blood glucose? Where to find more information: American Diabetes Association: diabetes.org Academy of Nutrition and Dietetics: eatright.[employer] of Diabetes and Digestive and Kidney Diseases: [employer] Association of Diabetes Care & Education Specialists: diabeteseducator.org Summary It is important to have healthy eating habits because your blood sugar (glucose) levels are greatly affected by what you eat and drink. It is important to use alcohol carefully. A healthy meal plan will help you manage your blood glucose and lower your risk of heart disease. Your health care provider may recommend that you work with a dietitian to make a meal plan that is best for you. This information is not intended to replace advice given to you by your health care provider. Make sure you discuss any questions you have with your health care provider. Document Revised: 03/09/2020 Document Reviewed: 03/09/2020 Elsevier Patient Education  2022 [place], MD [facility]

## 2021-07-11 NOTE — Assessment & Plan Note (Signed)
Much improved diabetes with improved hemoglobin A1c Lab Results  Component Value Date   HGBA1C 8.6 (A) 07/11/2021  Did not start taking Trulicity. Presently taking glipizide XL 20 mg twice a day. Intolerant to metformin. We will start Farxiga 5 mg daily.  Hypoglycemia precautions given.  Advised to reduce dose of glipizide if he starts noticing frequent low blood glucose numbers. Diet and nutrition discussed. Continue rosuvastatin 10 mg daily. Follow-up in 3 months.

## 2021-07-11 NOTE — Patient Instructions (Signed)

## 2021-07-20 ENCOUNTER — Telehealth: Payer: Self-pay | Admitting: Emergency Medicine

## 2021-07-20 ENCOUNTER — Other Ambulatory Visit: Payer: Self-pay

## 2021-07-20 DIAGNOSIS — E1165 Type 2 diabetes mellitus with hyperglycemia: Secondary | ICD-10-CM

## 2021-07-20 MED ORDER — ACCU-CHEK AVIVA PLUS VI STRP: ORAL_STRIP | 3 refills | Status: DC

## 2021-07-20 NOTE — Telephone Encounter (Signed)
1.Medication Requested: glucose blood (ACCU-CHEK AVIVA PLUS) test strip  2. Pharmacy (Name, Street, [place]): [phone] [place], [place] - [address]  3. On Med List: yes   4. Last Visit with PCP: 07-11-2021  5. Next visit date with PCP: 08-11-2021   Agent: Please be advised that RX refills may take up to 3 business days. We ask that you follow-up with your pharmacy.

## 2021-10-12 ENCOUNTER — Ambulatory Visit (INDEPENDENT_AMBULATORY_CARE_PROVIDER_SITE_OTHER): Payer: Medicare Other | Admitting: Emergency Medicine

## 2021-10-12 ENCOUNTER — Other Ambulatory Visit: Payer: Self-pay

## 2021-10-12 ENCOUNTER — Encounter: Payer: Self-pay | Admitting: Emergency Medicine

## 2021-10-12 VITALS — BP 130/70 | HR 86 | Temp 98.1°F | Ht 64.0 in | Wt 177.0 lb

## 2021-10-12 DIAGNOSIS — K4090 Unilateral inguinal hernia, without obstruction or gangrene, not specified as recurrent: Secondary | ICD-10-CM | POA: Insufficient documentation

## 2021-10-12 DIAGNOSIS — N5201 Erectile dysfunction due to arterial insufficiency: Secondary | ICD-10-CM

## 2021-10-12 DIAGNOSIS — E1165 Type 2 diabetes mellitus with hyperglycemia: Secondary | ICD-10-CM | POA: Diagnosis not present

## 2021-10-12 LAB — COMPREHENSIVE METABOLIC PANEL
ALT: 17 U/L (ref 0–53)
AST: 18 U/L (ref 0–37)
Albumin: 4.5 g/dL (ref 3.5–5.2)
Alkaline Phosphatase: 51 U/L (ref 39–117)
BUN: 13 mg/dL (ref 6–23)
CO2: 30 mEq/L (ref 19–32)
Calcium: 9.7 mg/dL (ref 8.4–10.5)
Chloride: 101 mEq/L (ref 96–112)
Creatinine, Ser: 1.03 mg/dL (ref 0.40–1.50)
GFR: 76.34 mL/min (ref >60.00)
Glucose, Bld: 240 mg/dL — ABNORMAL HIGH (ref 70–99)
Potassium: 4 mEq/L (ref 3.5–5.1)
Sodium: 138 mEq/L (ref 135–145)
Total Bilirubin: 0.8 mg/dL (ref 0.2–1.2)
Total Protein: 6.8 g/dL (ref 6.0–8.3)

## 2021-10-12 LAB — POCT GLYCOSYLATED HEMOGLOBIN (HGB A1C): Hemoglobin A1C: 8 % — AB (ref 4.0–5.6)

## 2021-10-12 LAB — MICROALBUMIN / CREATININE URINE RATIO
Creatinine,U: 43.9 mg/dL
Microalb Creat Ratio: 1.6 mg/g (ref 0.0–30.0)
Microalb, Ur: <0.7 mg/dL (ref 0.0–1.9)

## 2021-10-12 LAB — LIPID PANEL
Cholesterol: 168 mg/dL (ref 0–200)
HDL: 44.1 mg/dL (ref >39.00)
LDL Cholesterol: 103 mg/dL — ABNORMAL HIGH (ref 0–99)
NonHDL: 124.03
Total CHOL/HDL Ratio: 4
Triglycerides: 104 mg/dL (ref 0.0–149.0)
VLDL: 20.8 mg/dL (ref 0.0–40.0)

## 2021-10-12 MED ORDER — SILDENAFIL CITRATE 100 MG PO TABS: 50.0000 mg | ORAL_TABLET | Freq: Every day | ORAL | 11 refills | Status: DC | PRN

## 2021-10-12 MED ORDER — DAPAGLIFLOZIN PROPANEDIOL 10 MG PO TABS: 10.0000 mg | ORAL_TABLET | Freq: Every day | ORAL | 3 refills | Status: DC

## 2021-10-12 NOTE — Progress Notes (Signed)
David Strong 66 y.o.   Chief Complaint  Patient presents with   Diabetes    F/u    HISTORY OF PRESENT ILLNESS: This is a 66 y.o. male with history of diabetes here for follow-up. Presently taking glipizide 10 mg twice a day and Farxiga 5 mg daily. Doing well.  Glucose numbers at home much improved. Also has history of right inguinal hernia which apparently is getting worse. Lab Results  Component Value Date   HGBA1C 8.6 (A) 07/11/2021   Wt Readings from Last 3 Encounters:  07/11/21 181 lb (82.1 kg)  03/28/21 181 lb (82.1 kg)  04/07/20 186 lb 3.2 oz (84.5 kg)      Diabetes Pertinent negatives for hypoglycemia include no dizziness or headaches. Pertinent negatives for diabetes include no chest pain.    Prior to Admission medications   Medication Sig Start Date End Date Taking? Authorizing Provider  FARXIGA 5 MG TABS tablet Take 5 mg by mouth daily. 10/09/21  Yes [provider]  glipiZIDE (GLIPIZIDE XL) 10 MG 24 hr tablet Take 2 tablets (20 mg total) by mouth 2 (two) times daily with a meal. 05/08/21 10/12/21 Yes Jahanna Raether, Ines Bloomer, MD  glucose blood (ACCU-CHEK AVIVA PLUS) test strip Use to test blood glucose daily. 07/20/21  Yes Kasai Beltran, Ines Bloomer, MD  rosuvastatin (CRESTOR) 10 MG tablet Take 1 tablet (10 mg total) by mouth daily. 01/14/20  Yes Jacelyn Pi, Lilia Argue, MD  sildenafil (VIAGRA) 100 MG tablet Take 0.5-1 tablets (50-100 mg total) by mouth daily as needed for erectile dysfunction. 07/11/21  Yes Horald Pollen, MD    Allergies  Allergen Reactions   Codeine Itching   Glipizide Er [Glipizide]     SOB, malaise. He tolerates XL without issues.   Metformin And Related Rash    Patient Active Problem List   Diagnosis Date Noted   Type 2 diabetes mellitus ([place]) 06/07/2015   Glucose intolerance (impaired glucose tolerance) 08/03/2012   Leucopenia 08/03/2012   Thrombocytopenia ([place]) 08/03/2012    Past Medical History:  Diagnosis Date   Diabetes  mellitus without complication ([place])     History reviewed. No pertinent surgical history.  Social History   Socioeconomic History   Marital status: Married    Spouse name: Not on file   Number of children: Not on file   Years of education: Not on file   Highest education level: Not on file  Occupational History   Not on file  Tobacco Use   Smoking status: Former   Smokeless tobacco: Never  Substance and Sexual Activity   Alcohol use: Yes    Comment: occasional   Drug use: No   Sexual activity: Yes  Other Topics Concern   Not on file  Social History Narrative   Not on file   Social Determinants of Health   Financial Resource Strain: Not on file  Food Insecurity: Not on file  Transportation Needs: Not on file  Physical Activity: Not on file  Stress: Not on file  Social Connections: Not on file  Intimate Partner Violence: Not on file    Family History  Adopted: Yes     Review of Systems  Constitutional: Negative.  Negative for chills and fever.  HENT: Negative.  Negative for congestion and sore throat.   Respiratory: Negative.  Negative for cough and shortness of breath.   Cardiovascular: Negative.  Negative for chest pain and palpitations.  Gastrointestinal: Negative.  Negative for abdominal pain, nausea and vomiting.  Genitourinary: Negative.  Negative for dysuria and hematuria.  Skin: Negative.  Negative for rash.  Neurological: Negative.  Negative for dizziness and headaches.  All other systems reviewed and are negative.  Today's Vitals   10/12/21 1314  BP: 140/86  Pulse: 86  Temp: 98.1 F (36.7 C)  TempSrc: Oral  SpO2: 96%  Weight: 177 lb (80.3 kg)  Height: 5\' 4"  (1.626 m)   Body mass index is 30.38 kg/m.  Physical Exam Vitals reviewed.  Constitutional:      Appearance: Normal appearance.  HENT:     Head: Normocephalic.  Eyes:     Extraocular Movements: Extraocular movements intact.     Pupils: Pupils are equal, round, and reactive to  light.  Cardiovascular:     Rate and Rhythm: Normal rate and regular rhythm.     Pulses: Normal pulses.     Heart sounds: Normal heart sounds.  Pulmonary:     Effort: Pulmonary effort is normal.     Breath sounds: Normal breath sounds.  Abdominal:     Palpations: There is no mass.     Tenderness: There is no abdominal tenderness.     Hernia: A hernia is present. Hernia is present in the right inguinal area. There is no hernia in the left inguinal area.  Genitourinary:    Penis: Circumcised.      Testes: Normal.  Musculoskeletal:        General: Normal range of motion.     Cervical back: No tenderness.  Lymphadenopathy:     Cervical: No cervical adenopathy.  Skin:    General: Skin is warm and dry.     Capillary Refill: Capillary refill takes less than 2 seconds.  Neurological:     General: No focal deficit present.     Mental Status: He is alert and oriented to person, place, and time.  Psychiatric:        Mood and Affect: Mood normal.        Behavior: Behavior normal.    Results for orders placed or performed in visit on 10/12/21 (from the past 24 hour(s))  POCT glycosylated hemoglobin (Hb A1C)     Status: Abnormal   Collection Time: 10/12/21  1:26 PM  Result Value Ref Range   Hemoglobin A1C 8.0 (A) 4.0 - 5.6 %   HbA1c POC (<> result, manual entry)     HbA1c, POC (prediabetic range)     HbA1c, POC (controlled diabetic range)      ASSESSMENT & PLAN: A total of 47 minutes was spent with the patient and counseling/coordination of care regarding preparing for this visit, review of most recent office visit notes, review of most recent blood work results including today's hemoglobin A1c, review of all medications and changes made including increasing dose of Farxiga to 10 mg daily, hypoglycemic precautions given, education on nutrition, diagnosis and treatment of inguinal hernia, need for general surgery evaluation, prognosis, documentation and need for follow-up.  Problem List  Items Addressed This Visit       Endocrine   Type 2 diabetes mellitus ([place]) - Primary    Uncontrolled diabetes with hemoglobin A1c at 8.0.  However it is better than before. We will continue glipizide 20 mg twice a day.  No hypoglycemic events at home.  No history of them.  We will increase Farxiga to 10 mg daily. Diet and nutrition discussed. Follow-up in 3 months. Continue rosuvastatin 10 mg daily. The 10-year ASCVD risk score (Arnett DK, et al., 2019) is: 20.3%   Values  used to calculate the score:     Age: 29 years     Sex: Male     Is Non-Hispanic African American: Yes     Diabetic: Yes     Tobacco smoker: No     Systolic Blood Pressure: [id number] mmHg     Is BP treated: No     HDL Cholesterol: 48.3 mg/dL     Total Cholesterol: 176 mg/dL       Relevant Medications   dapagliflozin propanediol (FARXIGA) 10 MG TABS tablet   Other Relevant Orders   Urine Microalbumin w/creat. ratio   Comprehensive metabolic panel   Lipid panel   POCT glycosylated hemoglobin (Hb A1C) (Completed)     Other   Right inguinal hernia    No complications.  No incarceration or strangulation. Present for many years.  Increasing in size. Needs evaluation by general surgeon.  Referral placed today.      Relevant Orders   Ambulatory referral to General Surgery   Other Visit Diagnoses     Erectile dysfunction due to arterial insufficiency       Relevant Medications   sildenafil (VIAGRA) 100 MG tablet      Patient Instructions  Inguinal Hernia, Adult An inguinal hernia is when fat or your intestines push through a weak spot in a muscle where your leg meets your lower belly (groin). This causes a bulge. This kind of hernia could also be: In your scrotum, if you are male. In folds of skin around your vagina, if you are male. There are three types of inguinal hernias: Hernias that can be pushed back into the belly (are reducible). This type rarely causes pain. Hernias that cannot be pushed back  into the belly (are incarcerated). Hernias that cannot be pushed back into the belly and lose their blood supply (are strangulated). This type needs emergency surgery. What are the causes? This condition is caused by having a weak spot in the muscles or tissues in your groin. This develops over time. The hernia may poke through the weak spot when you strain your lower belly muscles all of a sudden, such as when you: Lift a heavy object. Strain to poop (have a bowel movement). Trouble pooping (constipation) can lead to straining. Cough. What increases the risk? This condition is more likely to develop in: Males. Pregnant females. People who: Are overweight. Work in jobs that require long periods of standing or heavy lifting. Have had an inguinal hernia before. Smoke or have lung disease. These factors can lead to long-term (chronic) coughing. What are the signs or symptoms? Symptoms may depend on the size of the hernia. Often, a small hernia has no symptoms. Symptoms of a larger hernia may include: A bulge in the groin area. This is easier to see when standing. You might not be able to see it when you are lying down. Pain or burning in the groin. This may get worse when you lift, strain, or cough. A dull ache or a feeling of pressure in the groin. An abnormal bulge in the scrotum, in males. Symptoms of a strangulated inguinal hernia may include: A bulge in your groin that is very painful and tender to the touch. A bulge that turns red or purple. Fever, feeling like you may vomit (nausea), and vomiting. Not being able to poop or to pass gas. How is this treated? Treatment depends on the size of your hernia and whether you have symptoms. If you do not have symptoms, your doctor may  have you watch your hernia carefully and have you come in for follow-up visits. If your hernia is large or if you have symptoms, you may need surgery to repair the hernia. Follow these instructions at  home: Lifestyle Avoid lifting heavy objects. Avoid standing for long amounts of time. Do not smoke or use any products that contain nicotine or tobacco. If you need help quitting, ask your doctor. Stay at a healthy weight. Prevent trouble pooping You may need to take these actions to prevent or treat trouble pooping: Drink enough fluid to keep your pee (urine) pale yellow. Take over-the-counter or prescription medicines. Eat foods that are high in fiber. These include beans, whole grains, and fresh fruits and vegetables. Limit foods that are high in fat and sugar. These include fried or sweet foods. General instructions You may try to push your hernia back in place by very gently pressing on it when you are lying down. Do not try to push the bulge back in if it will not go in easily. Watch your hernia for any changes in shape, size, or color. Tell your doctor if you see any changes. Take over-the-counter and prescription medicines only as told by your doctor. Keep all follow-up visits. Contact a doctor if: You have a fever or chills. You have new symptoms. Your symptoms get worse. Get help right away if: You have pain in your groin that gets worse all of a sudden. You have a bulge in your groin that: Gets bigger all of a sudden, and it does not get smaller after that. Turns red or purple. Is painful when you touch it. You are a male, and you have: Sudden pain in your scrotum. A sudden change in the size of your scrotum. You cannot push the hernia back in place by very gently pressing on it when you are lying down. You feel like you may vomit, and that feeling does not go away. You keep vomiting. You have a fast heartbeat. You cannot poop or pass gas. These symptoms may be an emergency. Get help right away. Call your local emergency services (911 in the [place] Do not wait to see if the symptoms will go away. Do not drive yourself to the hospital. Summary An inguinal hernia is  when fat or your intestines push through a weak spot in a muscle where your leg meets your lower belly (groin). This causes a bulge. If you do not have symptoms, you may not need treatment. If you have symptoms or a large hernia, you may need surgery. Avoid lifting heavy objects. Also, avoid standing for long amounts of time. Do not try to push the bulge back in if it will not go in easily. This information is not intended to replace advice given to you by your health care provider. Make sure you discuss any questions you have with your health care provider. Document Revised: 04/05/2020 Document Reviewed: 04/05/2020 Elsevier Patient Education  2022 [place], MD [facility]

## 2021-10-12 NOTE — Assessment & Plan Note (Signed)
Uncontrolled diabetes with hemoglobin A1c at 8.0.  However it is better than before. We will continue glipizide 20 mg twice a day.  No hypoglycemic events at home.  No history of them.  We will increase Farxiga to 10 mg daily. Diet and nutrition discussed. Follow-up in 3 months. Continue rosuvastatin 10 mg daily. The 10-year ASCVD risk score (Arnett DK, et al., 2019) is: 20.3%   Values used to calculate the score:     Age: 66 years     Sex: Male     Is Non-Hispanic African American: Yes     Diabetic: Yes     Tobacco smoker: No     Systolic Blood Pressure: 140 mmHg     Is BP treated: No     HDL Cholesterol: 48.3 mg/dL     Total Cholesterol: 176 mg/dL

## 2021-10-12 NOTE — Patient Instructions (Signed)
Inguinal Hernia, Adult An inguinal hernia is when fat or your intestines push through a weak spot in a muscle where your leg meets your lower belly (groin). This causes a bulge. This kind of hernia could also be: In your scrotum, if you are male. In folds of skin around your vagina, if you are male. There are three types of inguinal hernias: Hernias that can be pushed back into the belly (are reducible). This type rarely causes pain. Hernias that cannot be pushed back into the belly (are incarcerated). Hernias that cannot be pushed back into the belly and lose their blood supply (are strangulated). This type needs emergency surgery. What are the causes? This condition is caused by having a weak spot in the muscles or tissues in your groin. This develops over time. The hernia may poke through the weak spot when you strain your lower belly muscles all of a sudden, such as when you: Lift a heavy object. Strain to poop (have a bowel movement). Trouble pooping (constipation) can lead to straining. Cough. What increases the risk? This condition is more likely to develop in: Males. Pregnant females. People who: Are overweight. Work in jobs that require long periods of standing or heavy lifting. Have had an inguinal hernia before. Smoke or have lung disease. These factors can lead to long-term (chronic) coughing. What are the signs or symptoms? Symptoms may depend on the size of the hernia. Often, a small hernia has no symptoms. Symptoms of a larger hernia may include: A bulge in the groin area. This is easier to see when standing. You might not be able to see it when you are lying down. Pain or burning in the groin. This may get worse when you lift, strain, or cough. A dull ache or a feeling of pressure in the groin. An abnormal bulge in the scrotum, in males. Symptoms of a strangulated inguinal hernia may include: A bulge in your groin that is very painful and tender to the touch. A bulge  that turns red or purple. Fever, feeling like you may vomit (nausea), and vomiting. Not being able to poop or to pass gas. How is this treated? Treatment depends on the size of your hernia and whether you have symptoms. If you do not have symptoms, your doctor may have you watch your hernia carefully and have you come in for follow-up visits. If your hernia is large or if you have symptoms, you may need surgery to repair the hernia. Follow these instructions at home: Lifestyle Avoid lifting heavy objects. Avoid standing for long amounts of time. Do not smoke or use any products that contain nicotine or tobacco. If you need help quitting, ask your doctor. Stay at a healthy weight. Prevent trouble pooping You may need to take these actions to prevent or treat trouble pooping: Drink enough fluid to keep your pee (urine) pale yellow. Take over-the-counter or prescription medicines. Eat foods that are high in fiber. These include beans, whole grains, and fresh fruits and vegetables. Limit foods that are high in fat and sugar. These include fried or sweet foods. General instructions You may try to push your hernia back in place by very gently pressing on it when you are lying down. Do not try to push the bulge back in if it will not go in easily. Watch your hernia for any changes in shape, size, or color. Tell your doctor if you see any changes. Take over-the-counter and prescription medicines only as told by your doctor. Keep   all follow-up visits. Contact a doctor if: You have a fever or chills. You have new symptoms. Your symptoms get worse. Get help right away if: You have pain in your groin that gets worse all of a sudden. You have a bulge in your groin that: Gets bigger all of a sudden, and it does not get smaller after that. Turns red or purple. Is painful when you touch it. You are a male, and you have: Sudden pain in your scrotum. A sudden change in the size of your scrotum. You  cannot push the hernia back in place by very gently pressing on it when you are lying down. You feel like you may vomit, and that feeling does not go away. You keep vomiting. You have a fast heartbeat. You cannot poop or pass gas. These symptoms may be an emergency. Get help right away. Call your local emergency services (911 in the [place] Do not wait to see if the symptoms will go away. Do not drive yourself to the hospital. Summary An inguinal hernia is when fat or your intestines push through a weak spot in a muscle where your leg meets your lower belly (groin). This causes a bulge. If you do not have symptoms, you may not need treatment. If you have symptoms or a large hernia, you may need surgery. Avoid lifting heavy objects. Also, avoid standing for long amounts of time. Do not try to push the bulge back in if it will not go in easily. This information is not intended to replace advice given to you by your health care provider. Make sure you discuss any questions you have with your health care provider. Document Revised: 04/05/2020 Document Reviewed: 04/05/2020 Elsevier Patient Education  2022 Elsevier Inc[employer]  

## 2021-10-12 NOTE — Assessment & Plan Note (Signed)
No complications.  No incarceration or strangulation. Present for many years.  Increasing in size. Needs evaluation by general surgeon.  Referral placed today.

## 2021-10-26 ENCOUNTER — Telehealth: Payer: Self-pay | Admitting: Emergency Medicine

## 2021-10-26 NOTE — Telephone Encounter (Signed)
Labs mailed to address on file

## 2021-10-26 NOTE — Telephone Encounter (Signed)
Patient calling in ? ?Patient would like copy of labs from 02/23 mailed to his home address listed on file ?

## 2021-11-17 ENCOUNTER — Telehealth: Payer: Self-pay | Admitting: Emergency Medicine

## 2021-11-17 NOTE — Telephone Encounter (Signed)
Pt states he is experiencing constipation w/ sharp lower back pain due to dapagliflozin propanediol (FARXIGA) 10 MG TABS tablet ? ?Pt states before rx was changed he was taking the white coated 5mg  farxiga tablet, the new rx is yellow coated ? ?Pt inquiring if provider can switch the rx back ? ? ?Pt also stated he is coughing up brownish much with a light tint of blood ? ?Offered pt a sooner appt w/ another [facility] provider pt declined stating he will wait on his provider ? ?Next appt is 11-30-2021 ? ?

## 2021-11-20 NOTE — Telephone Encounter (Signed)
Not sure his symptoms are secondary to [place].  He has an appointment to see me on 11/30/2021.  He may want to stop [place] for couple days and see if it helps.  Thanks.

## 2021-11-20 NOTE — Telephone Encounter (Signed)
Called patient to inform him of provider recommendations. Patient verbalize understanding. No further questions  ?

## 2021-11-22 ENCOUNTER — Emergency Department (HOSPITAL_COMMUNITY): Payer: Medicare Other

## 2021-11-22 ENCOUNTER — Encounter (HOSPITAL_COMMUNITY): Payer: Self-pay

## 2021-11-22 ENCOUNTER — Other Ambulatory Visit: Payer: Self-pay

## 2021-11-22 ENCOUNTER — Encounter: Payer: Self-pay | Admitting: Family Medicine

## 2021-11-22 ENCOUNTER — Ambulatory Visit (INDEPENDENT_AMBULATORY_CARE_PROVIDER_SITE_OTHER): Payer: Medicare Other | Admitting: Family Medicine

## 2021-11-22 ENCOUNTER — Inpatient Hospital Stay (HOSPITAL_COMMUNITY)
Admission: EM | Admit: 2021-11-22 | Discharge: 2021-12-18 | DRG: 853 | Disposition: A | Discharge status: Home Health | Payer: Medicare Other | Source: Ambulatory Visit | Attending: Family Medicine | Admitting: Family Medicine

## 2021-11-22 VITALS — BP 116/86 | HR 120 | Temp 98.0°F

## 2021-11-22 DIAGNOSIS — M19072 Primary osteoarthritis, left ankle and foot: Secondary | ICD-10-CM | POA: Diagnosis not present

## 2021-11-22 DIAGNOSIS — R0689 Other abnormalities of breathing: Secondary | ICD-10-CM | POA: Diagnosis not present

## 2021-11-22 DIAGNOSIS — J189 Pneumonia, unspecified organism: Secondary | ICD-10-CM | POA: Diagnosis present

## 2021-11-22 DIAGNOSIS — R262 Difficulty in walking, not elsewhere classified: Secondary | ICD-10-CM

## 2021-11-22 DIAGNOSIS — M00272 Other streptococcal arthritis, left ankle and foot: Secondary | ICD-10-CM | POA: Diagnosis not present

## 2021-11-22 DIAGNOSIS — I82409 Acute embolism and thrombosis of unspecified deep veins of unspecified lower extremity: Secondary | ICD-10-CM

## 2021-11-22 DIAGNOSIS — R109 Unspecified abdominal pain: Secondary | ICD-10-CM | POA: Diagnosis not present

## 2021-11-22 DIAGNOSIS — I499 Cardiac arrhythmia, unspecified: Secondary | ICD-10-CM | POA: Diagnosis not present

## 2021-11-22 DIAGNOSIS — R600 Localized edema: Secondary | ICD-10-CM | POA: Diagnosis not present

## 2021-11-22 DIAGNOSIS — R29898 Other symptoms and signs involving the musculoskeletal system: Secondary | ICD-10-CM | POA: Diagnosis not present

## 2021-11-22 DIAGNOSIS — E8729 Other acidosis: Secondary | ICD-10-CM | POA: Diagnosis present

## 2021-11-22 DIAGNOSIS — R7881 Bacteremia: Secondary | ICD-10-CM | POA: Diagnosis not present

## 2021-11-22 DIAGNOSIS — I10 Essential (primary) hypertension: Secondary | ICD-10-CM | POA: Diagnosis present

## 2021-11-22 DIAGNOSIS — M19011 Primary osteoarthritis, right shoulder: Secondary | ICD-10-CM | POA: Diagnosis not present

## 2021-11-22 DIAGNOSIS — M7989 Other specified soft tissue disorders: Secondary | ICD-10-CM | POA: Diagnosis not present

## 2021-11-22 DIAGNOSIS — E1165 Type 2 diabetes mellitus with hyperglycemia: Secondary | ICD-10-CM | POA: Diagnosis not present

## 2021-11-22 DIAGNOSIS — M00211 Other streptococcal arthritis, right shoulder: Secondary | ICD-10-CM | POA: Diagnosis not present

## 2021-11-22 DIAGNOSIS — M1990 Unspecified osteoarthritis, unspecified site: Secondary | ICD-10-CM | POA: Diagnosis not present

## 2021-11-22 DIAGNOSIS — L02414 Cutaneous abscess of left upper limb: Secondary | ICD-10-CM | POA: Diagnosis not present

## 2021-11-22 DIAGNOSIS — E872 Acidosis, unspecified: Secondary | ICD-10-CM | POA: Diagnosis present

## 2021-11-22 DIAGNOSIS — R000 Tachycardia, unspecified: Secondary | ICD-10-CM | POA: Diagnosis not present

## 2021-11-22 DIAGNOSIS — E871 Hypo-osmolality and hyponatremia: Secondary | ICD-10-CM | POA: Diagnosis present

## 2021-11-22 DIAGNOSIS — M25412 Effusion, left shoulder: Secondary | ICD-10-CM | POA: Diagnosis not present

## 2021-11-22 DIAGNOSIS — R531 Weakness: Secondary | ICD-10-CM | POA: Diagnosis not present

## 2021-11-22 DIAGNOSIS — I82432 Acute embolism and thrombosis of left popliteal vein: Secondary | ICD-10-CM | POA: Diagnosis not present

## 2021-11-22 DIAGNOSIS — M60011 Infective myositis, right shoulder: Secondary | ICD-10-CM | POA: Diagnosis not present

## 2021-11-22 DIAGNOSIS — R739 Hyperglycemia, unspecified: Secondary | ICD-10-CM | POA: Diagnosis not present

## 2021-11-22 DIAGNOSIS — I700 Atherosclerosis of aorta: Secondary | ICD-10-CM | POA: Diagnosis not present

## 2021-11-22 DIAGNOSIS — M7551 Bursitis of right shoulder: Secondary | ICD-10-CM | POA: Diagnosis not present

## 2021-11-22 DIAGNOSIS — E119 Type 2 diabetes mellitus without complications: Secondary | ICD-10-CM | POA: Diagnosis not present

## 2021-11-22 DIAGNOSIS — M60009 Infective myositis, unspecified site: Secondary | ICD-10-CM | POA: Diagnosis not present

## 2021-11-22 DIAGNOSIS — Z20822 Contact with and (suspected) exposure to covid-19: Secondary | ICD-10-CM | POA: Diagnosis present

## 2021-11-22 DIAGNOSIS — Z23 Encounter for immunization: Secondary | ICD-10-CM | POA: Diagnosis not present

## 2021-11-22 DIAGNOSIS — M71162 Other infective bursitis, left knee: Secondary | ICD-10-CM

## 2021-11-22 DIAGNOSIS — S21101A Unspecified open wound of right front wall of thorax without penetration into thoracic cavity, initial encounter: Secondary | ICD-10-CM | POA: Diagnosis not present

## 2021-11-22 DIAGNOSIS — L03313 Cellulitis of chest wall: Secondary | ICD-10-CM

## 2021-11-22 DIAGNOSIS — R748 Abnormal levels of other serum enzymes: Secondary | ICD-10-CM | POA: Diagnosis not present

## 2021-11-22 DIAGNOSIS — R058 Other specified cough: Secondary | ICD-10-CM

## 2021-11-22 DIAGNOSIS — I82402 Acute embolism and thrombosis of unspecified deep veins of left lower extremity: Secondary | ICD-10-CM | POA: Diagnosis not present

## 2021-11-22 DIAGNOSIS — I088 Other rheumatic multiple valve diseases: Secondary | ICD-10-CM | POA: Diagnosis not present

## 2021-11-22 DIAGNOSIS — E876 Hypokalemia: Secondary | ICD-10-CM

## 2021-11-22 DIAGNOSIS — T8189XA Other complications of procedures, not elsewhere classified, initial encounter: Secondary | ICD-10-CM | POA: Diagnosis not present

## 2021-11-22 DIAGNOSIS — A419 Sepsis, unspecified organism: Secondary | ICD-10-CM | POA: Diagnosis present

## 2021-11-22 DIAGNOSIS — I341 Nonrheumatic mitral (valve) prolapse: Secondary | ICD-10-CM | POA: Diagnosis not present

## 2021-11-22 DIAGNOSIS — M25812 Other specified joint disorders, left shoulder: Secondary | ICD-10-CM | POA: Diagnosis not present

## 2021-11-22 DIAGNOSIS — Z743 Need for continuous supervision: Secondary | ICD-10-CM | POA: Diagnosis not present

## 2021-11-22 DIAGNOSIS — M009 Pyogenic arthritis, unspecified: Secondary | ICD-10-CM | POA: Diagnosis present

## 2021-11-22 DIAGNOSIS — I82611 Acute embolism and thrombosis of superficial veins of right upper extremity: Secondary | ICD-10-CM | POA: Diagnosis not present

## 2021-11-22 DIAGNOSIS — R7401 Elevation of levels of liver transaminase levels: Secondary | ICD-10-CM | POA: Diagnosis not present

## 2021-11-22 DIAGNOSIS — R6889 Other general symptoms and signs: Secondary | ICD-10-CM | POA: Diagnosis not present

## 2021-11-22 DIAGNOSIS — Z6827 Body mass index (BMI) 27.0-27.9, adult: Secondary | ICD-10-CM

## 2021-11-22 DIAGNOSIS — E669 Obesity, unspecified: Secondary | ICD-10-CM | POA: Diagnosis present

## 2021-11-22 DIAGNOSIS — L03114 Cellulitis of left upper limb: Secondary | ICD-10-CM | POA: Diagnosis not present

## 2021-11-22 DIAGNOSIS — J13 Pneumonia due to Streptococcus pneumoniae: Secondary | ICD-10-CM | POA: Diagnosis present

## 2021-11-22 DIAGNOSIS — R079 Chest pain, unspecified: Secondary | ICD-10-CM | POA: Diagnosis not present

## 2021-11-22 DIAGNOSIS — J158 Pneumonia due to other specified bacteria: Secondary | ICD-10-CM | POA: Diagnosis not present

## 2021-11-22 DIAGNOSIS — Z888 Allergy status to other drugs, medicaments and biological substances status: Secondary | ICD-10-CM

## 2021-11-22 DIAGNOSIS — K828 Other specified diseases of gallbladder: Secondary | ICD-10-CM | POA: Diagnosis not present

## 2021-11-22 DIAGNOSIS — A403 Sepsis due to Streptococcus pneumoniae: Principal | ICD-10-CM | POA: Diagnosis present

## 2021-11-22 DIAGNOSIS — Z7984 Long term (current) use of oral hypoglycemic drugs: Secondary | ICD-10-CM

## 2021-11-22 DIAGNOSIS — R7302 Impaired glucose tolerance (oral): Secondary | ICD-10-CM

## 2021-11-22 DIAGNOSIS — Z794 Long term (current) use of insulin: Secondary | ICD-10-CM | POA: Diagnosis not present

## 2021-11-22 DIAGNOSIS — R6520 Severe sepsis without septic shock: Secondary | ICD-10-CM | POA: Diagnosis not present

## 2021-11-22 DIAGNOSIS — L02219 Cutaneous abscess of trunk, unspecified: Secondary | ICD-10-CM | POA: Diagnosis not present

## 2021-11-22 DIAGNOSIS — R0602 Shortness of breath: Secondary | ICD-10-CM | POA: Diagnosis not present

## 2021-11-22 DIAGNOSIS — M7122 Synovial cyst of popliteal space [Baker], left knee: Secondary | ICD-10-CM | POA: Diagnosis not present

## 2021-11-22 DIAGNOSIS — D62 Acute posthemorrhagic anemia: Secondary | ICD-10-CM | POA: Diagnosis not present

## 2021-11-22 DIAGNOSIS — Z79899 Other long term (current) drug therapy: Secondary | ICD-10-CM

## 2021-11-22 DIAGNOSIS — I82462 Acute embolism and thrombosis of left calf muscular vein: Secondary | ICD-10-CM | POA: Diagnosis not present

## 2021-11-22 DIAGNOSIS — D649 Anemia, unspecified: Secondary | ICD-10-CM | POA: Diagnosis not present

## 2021-11-22 DIAGNOSIS — L02416 Cutaneous abscess of left lower limb: Secondary | ICD-10-CM | POA: Diagnosis present

## 2021-11-22 DIAGNOSIS — M436 Torticollis: Secondary | ICD-10-CM

## 2021-11-22 DIAGNOSIS — B953 Streptococcus pneumoniae as the cause of diseases classified elsewhere: Secondary | ICD-10-CM

## 2021-11-22 DIAGNOSIS — D696 Thrombocytopenia, unspecified: Secondary | ICD-10-CM | POA: Diagnosis present

## 2021-11-22 DIAGNOSIS — K5900 Constipation, unspecified: Secondary | ICD-10-CM

## 2021-11-22 DIAGNOSIS — M79601 Pain in right arm: Secondary | ICD-10-CM | POA: Diagnosis not present

## 2021-11-22 DIAGNOSIS — Z885 Allergy status to narcotic agent status: Secondary | ICD-10-CM

## 2021-11-22 DIAGNOSIS — M609 Myositis, unspecified: Secondary | ICD-10-CM | POA: Diagnosis not present

## 2021-11-22 DIAGNOSIS — E1100 Type 2 diabetes mellitus with hyperosmolarity without nonketotic hyperglycemic-hyperosmolar coma (NKHHC): Secondary | ICD-10-CM | POA: Diagnosis not present

## 2021-11-22 DIAGNOSIS — I351 Nonrheumatic aortic (valve) insufficiency: Secondary | ICD-10-CM | POA: Diagnosis not present

## 2021-11-22 DIAGNOSIS — L0390 Cellulitis, unspecified: Secondary | ICD-10-CM

## 2021-11-22 DIAGNOSIS — M86112 Other acute osteomyelitis, left shoulder: Secondary | ICD-10-CM | POA: Diagnosis not present

## 2021-11-22 DIAGNOSIS — M00812 Arthritis due to other bacteria, left shoulder: Secondary | ICD-10-CM | POA: Diagnosis not present

## 2021-11-22 DIAGNOSIS — I96 Gangrene, not elsewhere classified: Secondary | ICD-10-CM | POA: Diagnosis not present

## 2021-11-22 DIAGNOSIS — R609 Edema, unspecified: Secondary | ICD-10-CM | POA: Diagnosis not present

## 2021-11-22 DIAGNOSIS — Z87891 Personal history of nicotine dependence: Secondary | ICD-10-CM

## 2021-11-22 DIAGNOSIS — M25462 Effusion, left knee: Secondary | ICD-10-CM | POA: Diagnosis not present

## 2021-11-22 LAB — CBC WITH DIFFERENTIAL/PLATELET
Abs Immature Granulocytes: 0 10*3/uL (ref 0.00–0.07)
Basophils Absolute: 0 10*3/uL (ref 0.0–0.1)
Basophils Relative: 0 %
Eosinophils Absolute: 0 10*3/uL (ref 0.0–0.5)
Eosinophils Relative: 0 %
HCT: 42.7 % (ref 39.0–52.0)
Hemoglobin: 14.5 g/dL (ref 13.0–17.0)
Lymphocytes Relative: 12 %
Lymphs Abs: 0.9 10*3/uL (ref 0.7–4.0)
MCH: 29.5 pg (ref 26.0–34.0)
MCHC: 34 g/dL (ref 30.0–36.0)
MCV: 87 fL (ref 80.0–100.0)
Monocytes Absolute: 0.5 10*3/uL (ref 0.1–1.0)
Monocytes Relative: 7 %
Neutro Abs: 6 10*3/uL (ref 1.7–7.7)
Neutrophils Relative %: 81 %
Platelets: 88 10*3/uL — ABNORMAL LOW (ref 150–400)
RBC: 4.91 MIL/uL (ref 4.22–5.81)
RDW: 13.7 % (ref 11.5–15.5)
WBC: 7.4 10*3/uL (ref 4.0–10.5)
nRBC: 0 % (ref 0.0–0.2)

## 2021-11-22 LAB — COMPREHENSIVE METABOLIC PANEL
ALT: 55 U/L — ABNORMAL HIGH (ref 0–44)
AST: 66 U/L — ABNORMAL HIGH (ref 15–41)
Albumin: 2.9 g/dL — ABNORMAL LOW (ref 3.5–5.0)
Alkaline Phosphatase: 151 U/L — ABNORMAL HIGH (ref 38–126)
Anion gap: 16 — ABNORMAL HIGH (ref 5–15)
BUN: 24 mg/dL — ABNORMAL HIGH (ref 8–23)
CO2: 20 mmol/L — ABNORMAL LOW (ref 22–32)
Calcium: 8.4 mg/dL — ABNORMAL LOW (ref 8.9–10.3)
Chloride: 95 mmol/L — ABNORMAL LOW (ref 98–111)
Creatinine, Ser: 0.88 mg/dL (ref 0.61–1.24)
GFR, Estimated: >60 mL/min (ref >60)
Glucose, Bld: 368 mg/dL — ABNORMAL HIGH (ref 70–99)
Potassium: 3.5 mmol/L (ref 3.5–5.1)
Sodium: 131 mmol/L — ABNORMAL LOW (ref 135–145)
Total Bilirubin: 1.6 mg/dL — ABNORMAL HIGH (ref 0.3–1.2)
Total Protein: 6.2 g/dL — ABNORMAL LOW (ref 6.5–8.1)

## 2021-11-22 LAB — ACETAMINOPHEN LEVEL: Acetaminophen (Tylenol), Serum: 14 ug/mL (ref 10–30)

## 2021-11-22 LAB — GLUCOSE, CAPILLARY: Glucose-Capillary: 335 mg/dL — ABNORMAL HIGH (ref 70–99)

## 2021-11-22 LAB — HEPATITIS PANEL, ACUTE
HCV Ab: NONREACTIVE
Hep A IgM: NONREACTIVE
Hep B C IgM: NONREACTIVE
Hepatitis B Surface Ag: NONREACTIVE

## 2021-11-22 LAB — RESP PANEL BY RT-PCR (FLU A&B, COVID) ARPGX2
Influenza A by PCR: NEGATIVE
Influenza B by PCR: NEGATIVE
SARS Coronavirus 2 by RT PCR: NEGATIVE

## 2021-11-22 LAB — CK: Total CK: 216 U/L (ref 49–397)

## 2021-11-22 LAB — SALICYLATE LEVEL: Salicylate Lvl: <7 mg/dL — ABNORMAL LOW (ref 7.0–30.0)

## 2021-11-22 LAB — TROPONIN I (HIGH SENSITIVITY)
Troponin I (High Sensitivity): 10 ng/L (ref <18)
Troponin I (High Sensitivity): 11 ng/L (ref <18)

## 2021-11-22 LAB — D-DIMER, QUANTITATIVE: D-Dimer, Quant: 5.37 ug/mL-FEU — ABNORMAL HIGH (ref 0.00–0.50)

## 2021-11-22 LAB — PROCALCITONIN: Procalcitonin: 7.31 ng/mL

## 2021-11-22 LAB — MAGNESIUM: Magnesium: 2.4 mg/dL (ref 1.7–2.4)

## 2021-11-22 LAB — GROUP A STREP BY PCR: Group A Strep by PCR: NOT DETECTED

## 2021-11-22 LAB — LACTIC ACID, PLASMA
Lactic Acid, Venous: 2.3 mmol/L (ref 0.5–1.9)
Lactic Acid, Venous: 2.2 mmol/L (ref 0.5–1.9)

## 2021-11-22 IMAGING — DX DG CHEST 1V PORT
1 series · 1 of 1 positions shown · non-contrast
Comparison: None.

CLINICAL DATA: Chest pain

EXAM:
PORTABLE CHEST 1 VIEW

[chest ap]
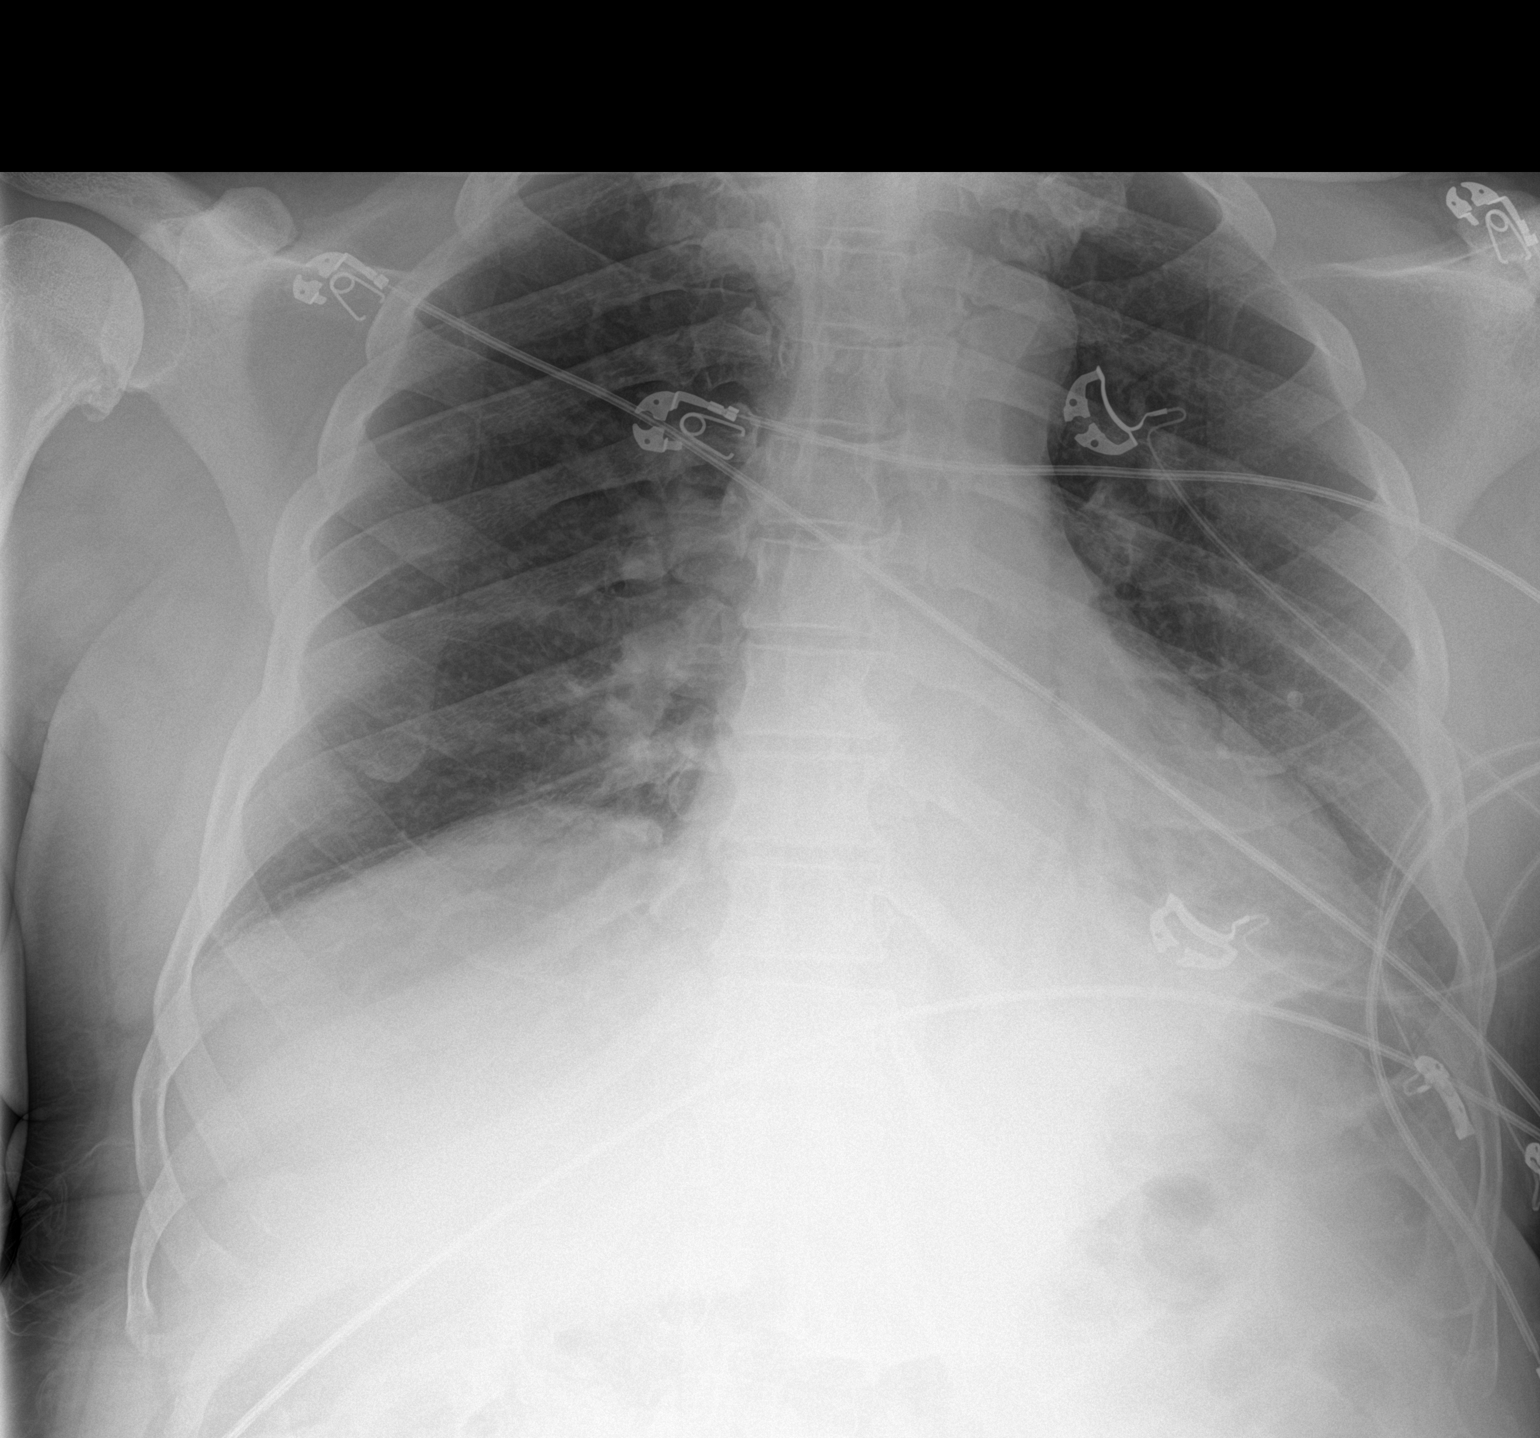

[1 of 1 positions shown; findings below may reference images not displayed]

FINDINGS: The heart size and mediastinal contours are within normal limits.
Both lungs are clear. The visualized skeletal structures are
unremarkable.
IMPRESSION: No active disease.

## 2021-11-22 IMAGING — CT CT ANGIO CHEST
2 of 6 series · 18 of 36 positions shown · IV contrast (agent unspecified)
Comparison: [DATE] chest x-ray

CLINICAL DATA: Upper respiratory central lines, progressive for 1
week, chest pain anteriorly

EXAM:
CT ANGIOGRAPHY CHEST WITH CONTRAST
TECHNIQUE: Multidetector CT imaging of the chest was performed using the
standard protocol during bolus administration of intravenous
contrast. Multiplanar CT image reconstructions and MIPs were
obtained to evaluate the vascular anatomy.

[Series 5: thins · axial · 0.71mm/px · z∈[-157,+121]mm · 17 of 314 slices shown]
[im 18/314  lung]
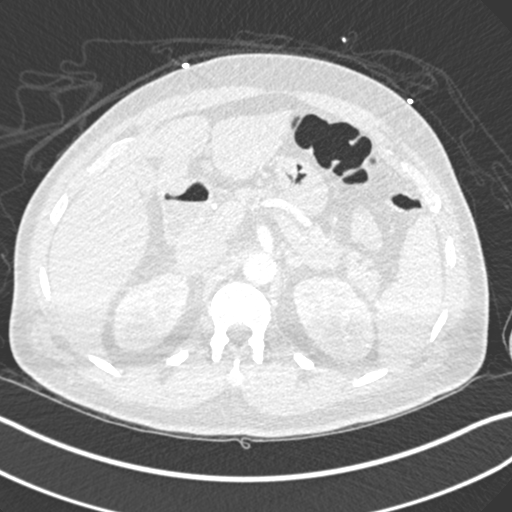
[im 35/314  mediastinal]
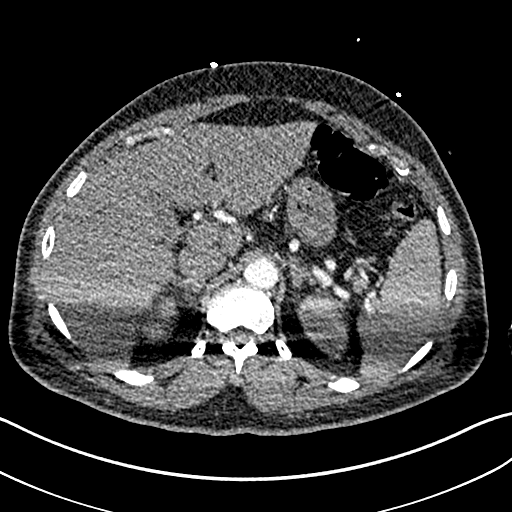
[im 53/314  lung]
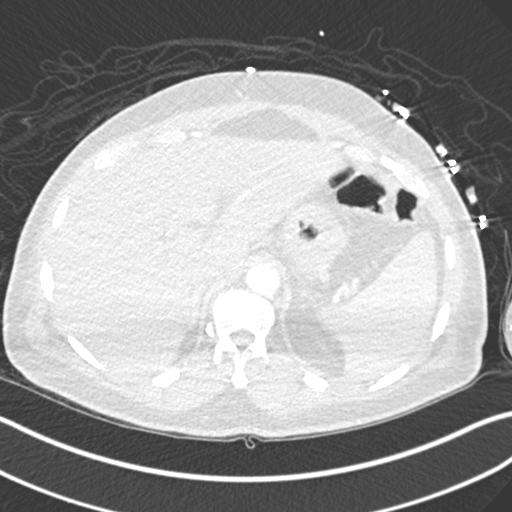
[im 70/314  mediastinal]
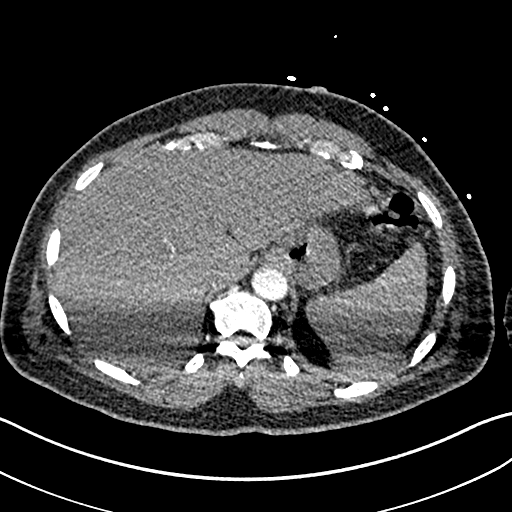
[im 87/314  lung]
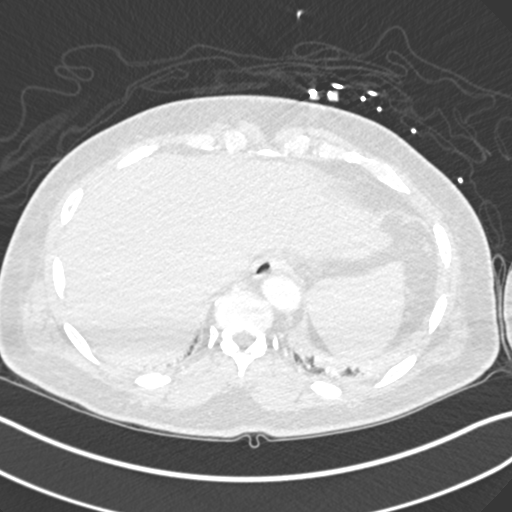
[im 105/314  mediastinal]
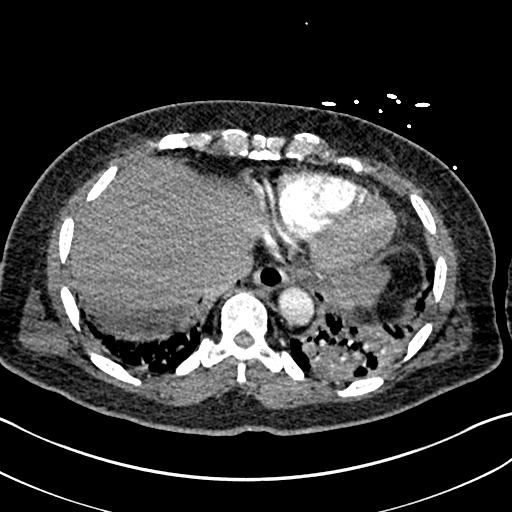
[im 122/314  lung]
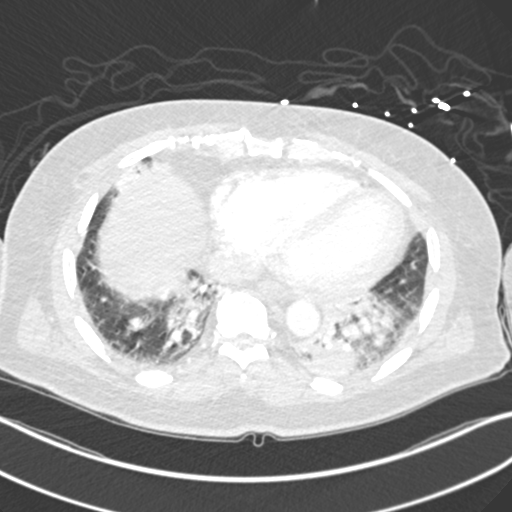
[im 140/314  mediastinal]
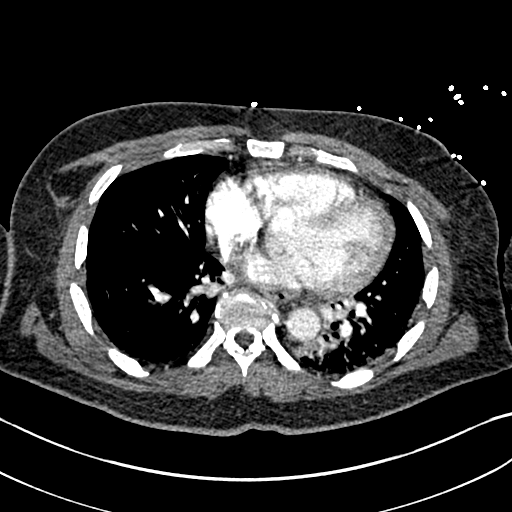
[im 157/314  lung]
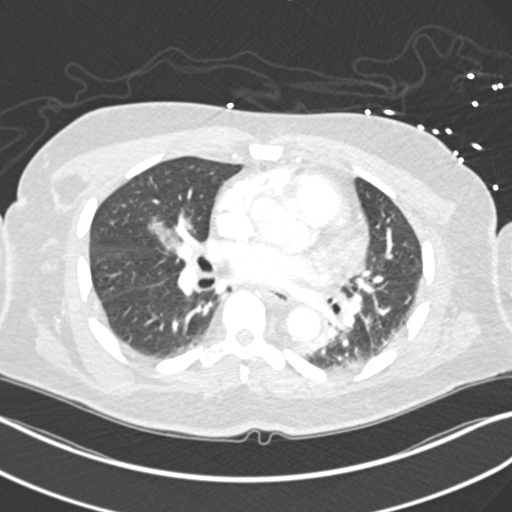
[im 174/314  mediastinal]
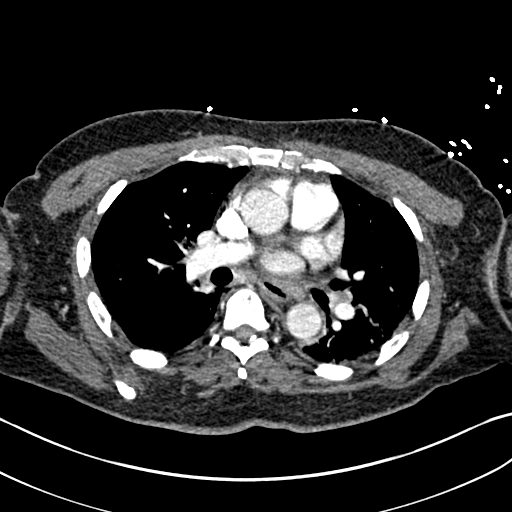
[im 192/314  lung]
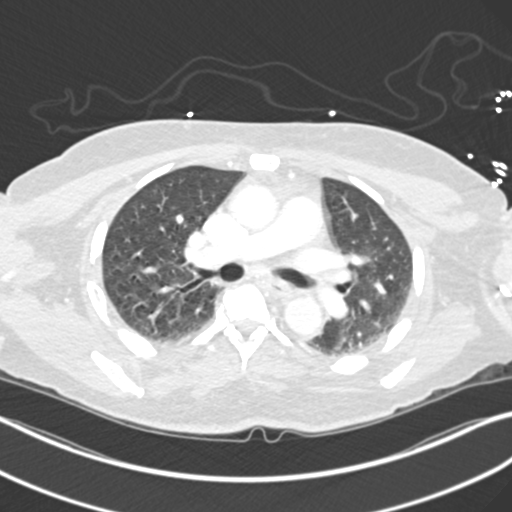
[im 209/314  mediastinal]
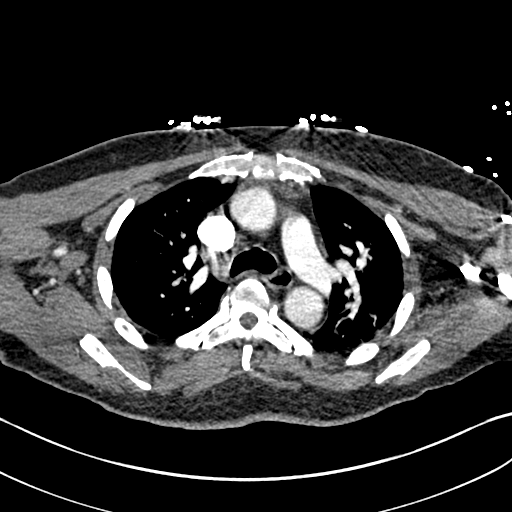
[im 227/314  lung]
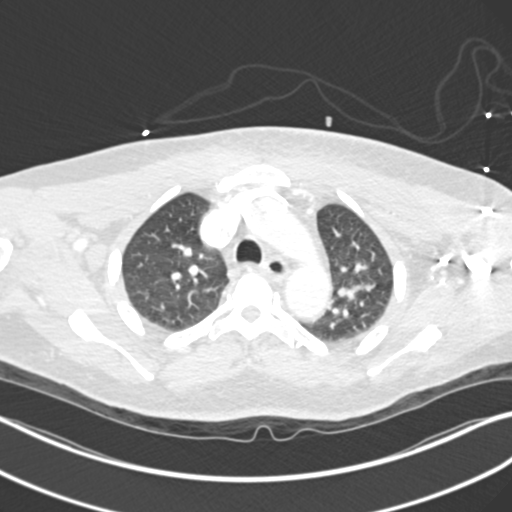
[im 244/314  mediastinal]
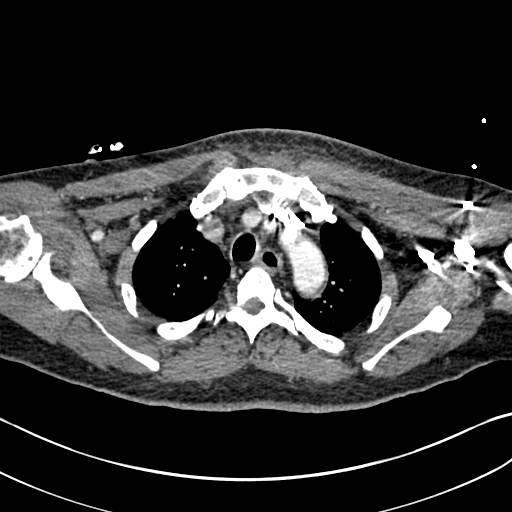
[im 261/314  lung]
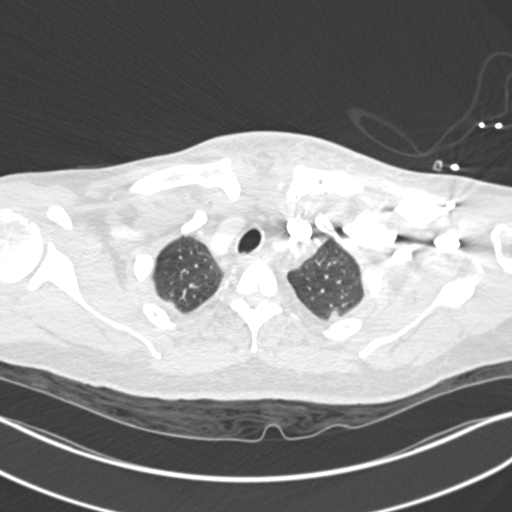
[im 279/314  mediastinal]
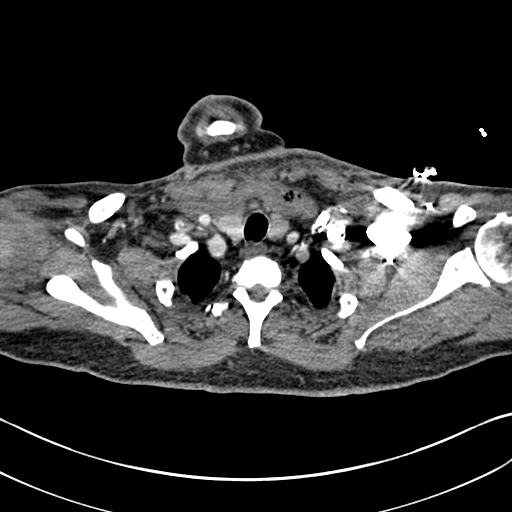
[im 296/314  lung]
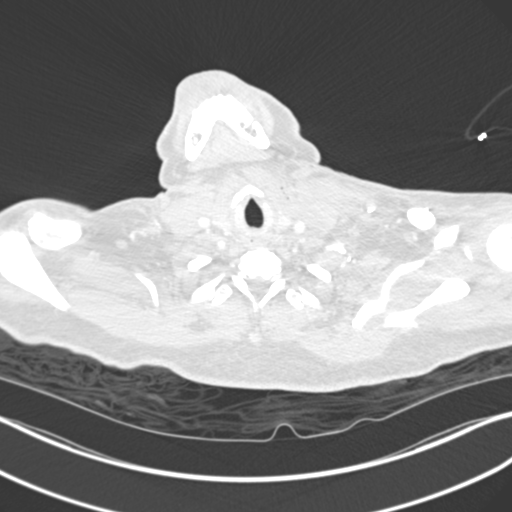

[Series 6: coronal mpr · coronal · 0.66mm/px · 1 of 127 slices shown]
[im 64/127  mediastinal]
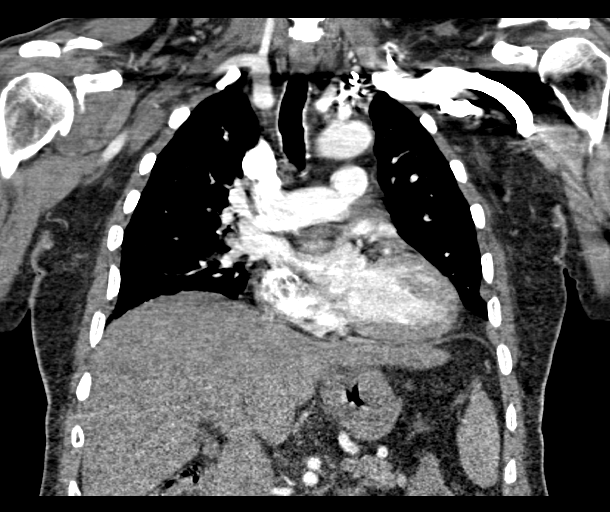

[18 of 36 positions shown; findings below may reference images not displayed]

RADIATION DOSE REDUCTION: This exam was performed according to the
departmental dose-optimization program which includes automated
exposure control, adjustment of the mA and/or kV according to
patient size and/or use of iterative reconstruction technique.

CONTRAST:  80mL OMNIPAQUE IOHEXOL 350 MG/ML SOLN
FINDINGS: Cardiovascular: The visualized central and proximal hilar pulmonary
arteries are normal in caliber and appear patent. No significant
large acute filling defect or pulmonary embolus by CTA.

Normal heart size.  No pericardial effusion.

Intact thoracic aorta. No aneurysm or dissection. Patent 3 vessel
arch anatomy.

Central venous structures are patent. No MERUGU process
within the chest.

Mediastinum/Nodes: No enlarged mediastinal, hilar, or axillary lymph
nodes. Thyroid gland, trachea, and esophagus demonstrate no
significant findings.

Lungs/Pleura: Dense bibasilar consolidative opacities with central
air bronchograms. Central right middle low patchy bronchovascular
airspace opacities as well. Findings compatible with multilobar
pneumonia.

No pleural abnormality, effusion, or pneumothorax.

Upper Abdomen: No acute abnormality.

Musculoskeletal: Strandy edema and soft tissue thickening in the
left lower neck. Scattered foci of left neck soft tissue air. These
inflammatory changes extend inferiorly to the left sternoclavicular
joint which has similar surrounding soft tissue thickening, edema,
and foci of gas. Air also present in the right sternoclavicular
joint but to a lesser degree. Foci of gas are present in the
proximal clavicle. Findings compatible with infectious process and
concern for involvement of the sternoclavicular joints. No
associated drainable fluid collection or joint effusion. There is
also overlying subcutaneous edema and skin thickening.

Review of the MIP images confirms the above findings.
IMPRESSION: Negative for significant acute pulmonary embolus by CTA.

Right middle lobe and bibasilar consolidative opacities compatible
with multifocal pneumonia.

Left inferior neck and sternoclavicular joint inflammatory changes
with scattered foci of air within the soft tissues, joint spaces,
and left clavicle, compatible with localized cellulitis/septic
arthritis. Again, no drainable fluid collection or large joint
effusion.

## 2021-11-22 MED ORDER — SODIUM CHLORIDE 0.9 % IV SOLN: INTRAVENOUS | Status: DC

## 2021-11-22 MED ORDER — AZITHROMYCIN 500 MG IV SOLR
500.0000 mg | INTRAVENOUS | Status: DC
  Administered 2021-11-22: 500 mg via INTRAVENOUS
  Filled 2021-11-22: qty 5

## 2021-11-22 MED ORDER — IOHEXOL 350 MG/ML SOLN
80.0000 mL | Freq: Once | INTRAVENOUS | Status: AC | PRN
  Administered 2021-11-22: 80 mL via INTRAVENOUS

## 2021-11-22 MED ORDER — NALOXONE HCL 0.4 MG/ML IJ SOLN
0.4000 mg | INTRAMUSCULAR | Status: DC | PRN
Start: 2021-11-22 — End: 2021-12-18

## 2021-11-22 MED ORDER — VANCOMYCIN HCL 1750 MG/350ML IV SOLN
1750.0000 mg | Freq: Once | INTRAVENOUS | Status: AC
  Administered 2021-11-22: 1750 mg via INTRAVENOUS
  Filled 2021-11-22: qty 350

## 2021-11-22 MED ORDER — INSULIN ASPART 100 UNIT/ML IJ SOLN
0.0000 [IU] | Freq: Every day | INTRAMUSCULAR | Status: DC
  Administered 2021-11-22: 4 [IU] via SUBCUTANEOUS
  Filled 2021-11-22: qty 0.05

## 2021-11-22 MED ORDER — ACETAMINOPHEN 500 MG PO TABS
1000.0000 mg | ORAL_TABLET | Freq: Once | ORAL | Status: AC
  Administered 2021-11-22: 1000 mg via ORAL
  Filled 2021-11-22: qty 2

## 2021-11-22 MED ORDER — SODIUM CHLORIDE 0.9 % IV SOLN: Freq: Once | INTRAVENOUS | Status: DC

## 2021-11-22 MED ORDER — LACTATED RINGERS IV BOLUS
1000.0000 mL | Freq: Once | INTRAVENOUS | Status: AC
  Administered 2021-11-22: 1000 mL via INTRAVENOUS

## 2021-11-22 MED ORDER — SODIUM CHLORIDE 0.9 % IV SOLN
2.0000 g | INTRAVENOUS | Status: DC
  Administered 2021-11-23 – 2021-11-26 (×5): 2 g via INTRAVENOUS
  Filled 2021-11-22 (×6): qty 20

## 2021-11-22 MED ORDER — BENZONATATE 100 MG PO CAPS
200.0000 mg | ORAL_CAPSULE | Freq: Three times a day (TID) | ORAL | Status: DC | PRN
Start: 2021-11-22 — End: 2021-12-18
  Administered 2021-11-24 – 2021-12-01 (×2): 200 mg via ORAL
  Filled 2021-11-22 (×3): qty 2

## 2021-11-22 MED ORDER — ACETAMINOPHEN 650 MG RE SUPP: 650.0000 mg | Freq: Four times a day (QID) | RECTAL | Status: DC | PRN

## 2021-11-22 MED ORDER — SODIUM CHLORIDE 0.9 % IV SOLN
2.0000 g | Freq: Once | INTRAVENOUS | Status: AC
  Administered 2021-11-22: 2 g via INTRAVENOUS
  Filled 2021-11-22: qty 12.5

## 2021-11-22 MED ORDER — MORPHINE SULFATE (PF) 4 MG/ML IV SOLN
4.0000 mg | INTRAVENOUS | Status: DC | PRN
  Administered 2021-11-22 – 2021-11-23 (×2): 4 mg via INTRAVENOUS
  Filled 2021-11-22 (×2): qty 1

## 2021-11-22 MED ORDER — VANCOMYCIN HCL 1750 MG/350ML IV SOLN: 1750.0000 mg | INTRAVENOUS | Status: DC

## 2021-11-22 MED ORDER — ACETAMINOPHEN 325 MG PO TABS
650.0000 mg | ORAL_TABLET | Freq: Four times a day (QID) | ORAL | Status: DC | PRN
  Administered 2021-11-23: 650 mg via ORAL
  Filled 2021-11-22: qty 2

## 2021-11-22 MED ORDER — INSULIN ASPART 100 UNIT/ML IJ SOLN
0.0000 [IU] | Freq: Three times a day (TID) | INTRAMUSCULAR | Status: DC
  Administered 2021-11-23: 9 [IU] via SUBCUTANEOUS
  Filled 2021-11-22: qty 0.09

## 2021-11-22 NOTE — Assessment & Plan Note (Addendum)
Pneumococcal pneumonia ?Management as noted above. ?As per patient's report, patient's 66-year-old son who goes to daycare had runny nose a couple days prior to patient getting sick. ?

## 2021-11-22 NOTE — Progress Notes (Signed)
A consult was received from an ED physician for cefepime and vancomycin per pharmacy dosing.  The patient's profile has been reviewed for ht/wt/allergies/indication/available labs.   ?A one time order has been placed for cefepime 2g and vancomycin 1750mg .  Further antibiotics/pharmacy consults should be ordered by admitting physician if indicated.       ?                ?Thank you, ?Peggyann Juba, PharmD, BCPS ?11/22/2021  6:25 PM ? ?

## 2021-11-22 NOTE — H&P (Addendum)
History and Physical    PLEASE NOTE THAT DRAGON DICTATION SOFTWARE WAS USED IN THE CONSTRUCTION OF THIS NOTE.   David Strong WUJ:[id number] DOB: 04-03-1956 DOA: 11/22/2021  PCP: [name], MD  Patient coming from: home   I have personally briefly reviewed patient's old medical records in [facility]  Chief Complaint: Cough  HPI: David Strong is a 66 y.o. male with medical history significant for type 2 diabetes mellitus, who is admitted to [facility] on 11/22/2021 with community-acquired pneumonia after presenting from home to [facility] ED complaining of cough.   The patient presents 2- 3 days of progressive nonproductive cough associated with subjective fever, chills, generalized myalgias, in the absence of full body rigors.  Denies any associated shortness of breath, orthopnea, PND, or new onset peripheral edema. No recent chest pain, diaphoresis, palpitations, N/V, pre-syncope, or syncope.  No associated any wheezing or new onset lower extremity erythema or calf tenderness.  No recent trauma.  No associated any neck stiffness, rhinitis, rhinorrhea, abdominal pain, diarrhea.  He also denies any associated dysuria or gross hematuria.  Additionally, patient's wife noted 1 to 2 days ago the patient was exhibiting evidence of new onset erythema associated with the left aspect of his anterior chest wall just left to the midline.  The patient conveys that he was unaware of this erythema until it was pointed out to him by his wife at that time.  He denies any associated significant tenderness, swelling, increased warmth to the touch, and has not noted any drainage from the site.  No recent trauma or surgery to this area including no CABG.  Denies any rash or new onset erythema in any other location.  In the setting of the aforementioned new onset cough, subjective fever, chills, along with new onset erythema of the left anterior chest wall, as above, the patient presented to his PCP  earlier today, who recommended that the patient present to the ED for further evaluation and management thereof.    ED Course:  Vital signs in the ED were notable for the following: Temperature max 99.4; initial heart rate 1 18-1 24, which is decreased to 113 following interval initiation of IV fluids, as further detailed below; blood pressure 122/88; respiratory rate 19-26; oxygen saturation 95 to 97% on room air.  Labs were notable for the following: CMP notable for the following: Sodium 131, which corrects to approximately 135 when taking into account concomitant hyperglycemia, bicarbonate 20, anion gap 16, bicarbonate 24, creatinine 0.88 relative to most recent prior serum creatinine data point of 1.03 in February 2023, BUN to creatinine ratio greater than 20, glucose 368.  Liver enzymes checked today, in comparison to most recent prior liver enzyme data points from 10/12/2021, were notable for the following: Albumin 2.9, alkaline phosphatase 151 compared to 51 on 10/12/2021, AST 66 compared to 18, ALT 55 compared to 17, and total bilirubin 1.6 compared to 0.8 on 10/12/2021.  CPK 216.  High-sensitivity troponin I initially 10, with repeat value trending up slightly to 11.  CBC notable for episode count 7400 with 81% neutrophils, hemoglobin 14.5.  Initial lactate 2.3.    In the setting of new onset cough with tachycardia, tachypnea, D-dimer was checked and found to be 5.37.  COVID-19/influenza PCR negative.  Blood cultures x2 were collected prior to initiation of IV antibiotics.  Imaging and additional notable ED work-up: EKG shows sinus tachycardia with heart rate 117, normal intervals, nonspecific T wave flattening in V2, and no evidence  of ST changes, including no evidence of ST elevation.  CTA of the chest showed no evidence of acute pulmonary embolism, demonstrating right middle lobe and bibasilar airspace opacities consistent with multifocal pneumonia, without evidence of edema, effusion, or  pneumothorax.  CTA chest also showed left sternoclavicular joint inflammatory changes with scattered foci of air within the soft tissues and sternoclavicular joint space compatible with localized left sternoclavicular septic arthritis/cellulitis, without evidence of drainable fluid collection or large joint effusion.  Regarding the possibility of septic arthritis involving the left sternoclavicular joint, EDP discussed patient's case with on-call orthopedic surgeon, who recommended discussions with CT surgery.  Consequently, EDP discussed patient's case and imaging with the on-call CT surgeon, Dr. Cliffton Asters, who felt that the case/imaging could be consistent with septic arthritis of the left sternoclavicular joint.  However, in the setting of no evidence of abscess or associated joint effusion, Dr. Cliffton Asters felt that the suspected left sternoclavicular septic arthritis could be managed medically, recommending IV antibiotics, but did not feel that any additional imaging of the left sternoclavicular joint, including MRI, were warranted at this time.  While in the ED, the following were administered: Acetaminophen 1 g p.o. x1, cefepime, IV vancomycin, lactated Ringer's x1 L bolus.  Subsequently, the patient was admitted for further evaluation and management of suspected community-acquired pneumonia along with suspected septic arthritis of the left sternoclavicular joint with associated cellulitis, with presenting labs also notable for mild lactic acidosis, anion gap metabolic acidosis, acute prerenal azotemia, and acute transaminitis.   Review of Systems: As per HPI otherwise 10 point review of systems negative.   Past Medical History:  Diagnosis Date   Diabetes mellitus without complication (HCC)     History reviewed. No pertinent surgical history.  Social History:  reports that he has quit smoking. He has never used smokeless tobacco. He reports current alcohol use. He reports that he does not use  drugs.   Allergies  Allergen Reactions   Codeine Itching   Glipizide Er [Glipizide]     SOB, malaise. He tolerates XL without issues.   Metformin And Related Rash    Family History  Adopted: Yes    Family history reviewed and not pertinent    Prior to Admission medications   Medication Sig Start Date End Date Taking? Authorizing Provider  dapagliflozin propanediol (FARXIGA) 10 MG TABS tablet Take 1 tablet (10 mg total) by mouth daily before breakfast. Patient not taking: Reported on 11/22/2021 10/12/21 01/10/22  [name], MD  glipiZIDE (GLIPIZIDE XL) 10 MG 24 hr tablet Take 2 tablets (20 mg total) by mouth 2 (two) times daily with a meal. 05/08/21 11/22/21  Sagardia, Eilleen Kempf, MD  glucose blood (ACCU-CHEK AVIVA PLUS) test strip Use to test blood glucose daily. 07/20/21   [name], MD  rosuvastatin (CRESTOR) 10 MG tablet Take 1 tablet (10 mg total) by mouth daily. 01/14/20   Noni Saupe, MD  sildenafil (VIAGRA) 100 MG tablet Take 0.5-1 tablets (50-100 mg total) by mouth daily as needed for erectile dysfunction. 10/12/21   [name], MD     Objective    Physical Exam: Vitals:   11/22/21 1715 11/22/21 1730 11/22/21 1830 11/22/21 1915  BP: 138/88 (!) 137/97 122/80 120/83  Pulse: (!) 118 (!) 115 (!) 113 (!) 115  Resp: (!) 30 (!) 26 19 (!) 22  Temp:      SpO2: 96% 96% 95% 94%  Weight:      Height:  General: appears to be stated age; alert, oriented Skin: warm, dry, no rash Head:  AT/[place] Mouth:  Oral mucosa membranes appear dry, normal dentition Neck: supple; trachea midline Heart: Mildly tachycardic, but regular; did not appreciate any M/R/G Lungs: CTAB, did not appreciate any wheezes, rales, or rhonchi Chest: Erythema associated the left anterior chest wall, just lateral to midline, without any associated tenderness, crepitus, increased warmth, or drainage Abdomen: + BS; soft, ND, NT Vascular: 2+ pedal pulses b/l; 2+ radial  pulses b/l Extremities: no peripheral edema, no muscle wasting Neuro: strength and sensation intact in upper and lower extremities b/l     Labs on Admission: I have personally reviewed following labs and imaging studies  CBC: Recent Labs  Lab 11/22/21 1608  WBC 7.4  NEUTROABS 6.0  HGB 14.5  HCT 42.7  MCV 87.0  PLT 88*   Basic Metabolic Panel: Recent Labs  Lab 11/22/21 1608 11/22/21 1808  NA 131*  --   K 3.5  --   CL 95*  --   CO2 20*  --   GLUCOSE 368*  --   BUN 24*  --   CREATININE 0.88  --   CALCIUM 8.4*  --   MG  --  2.4   GFR: Estimated Creatinine Clearance: 79 mL/min (by C-G formula based on SCr of 0.88 mg/dL). Liver Function Tests: Recent Labs  Lab 11/22/21 1608  AST 66*  ALT 55*  ALKPHOS 151*  BILITOT 1.6*  PROT 6.2*  ALBUMIN 2.9*   No results for input(s): LIPASE, AMYLASE in the last 168 hours. No results for input(s): AMMONIA in the last 168 hours. Coagulation Profile: No results for input(s): INR, PROTIME in the last 168 hours. Cardiac Enzymes: Recent Labs  Lab 11/22/21 1608  CKTOTAL 216   BNP (last 3 results) No results for input(s): PROBNP in the last 8760 hours. HbA1C: No results for input(s): HGBA1C in the last 72 hours. CBG: No results for input(s): GLUCAP in the last 168 hours. Lipid Profile: No results for input(s): CHOL, HDL, LDLCALC, TRIG, CHOLHDL, LDLDIRECT in the last 72 hours. Thyroid Function Tests: No results for input(s): TSH, T4TOTAL, FREET4, T3FREE, THYROIDAB in the last 72 hours. Anemia Panel: No results for input(s): VITAMINB12, FOLATE, FERRITIN, TIBC, IRON, RETICCTPCT in the last 72 hours. Urine analysis: No results found for: COLORURINE, APPEARANCEUR, LABSPEC, PHURINE, GLUCOSEU, HGBUR, BILIRUBINUR, KETONESUR, PROTEINUR, UROBILINOGEN, NITRITE, LEUKOCYTESUR  Radiological Exams on Admission: CT Angio Chest PE W and/or Wo Contrast  Result Date: 11/22/2021 CLINICAL DATA:  Upper respiratory central lines, progressive  for 1 week, chest pain anteriorly EXAM: CT ANGIOGRAPHY CHEST WITH CONTRAST TECHNIQUE: Multidetector CT imaging of the chest was performed using the standard protocol during bolus administration of intravenous contrast. Multiplanar CT image reconstructions and MIPs were obtained to evaluate the vascular anatomy. RADIATION DOSE REDUCTION: This exam was performed according to the departmental dose-optimization program which includes automated exposure control, adjustment of the mA and/or kV according to patient size and/or use of iterative reconstruction technique. CONTRAST:  80mL OMNIPAQUE IOHEXOL 350 MG/ML SOLN COMPARISON:  11/22/2021 chest x-ray FINDINGS: Cardiovascular: The visualized central and proximal hilar pulmonary arteries are normal in caliber and appear patent. No significant large acute filling defect or pulmonary embolus by CTA. Normal heart size.  No pericardial effusion. Intact thoracic aorta. No aneurysm or dissection. Patent 3 vessel arch anatomy. Central venous structures are patent. No veno-occlusive process within the chest. Mediastinum/Nodes: No enlarged mediastinal, hilar, or axillary lymph nodes. Thyroid gland, trachea, and esophagus  demonstrate no significant findings. Lungs/Pleura: Dense bibasilar consolidative opacities with central air bronchograms. Central right middle low patchy bronchovascular airspace opacities as well. Findings compatible with multilobar pneumonia. No pleural abnormality, effusion, or pneumothorax. Upper Abdomen: No acute abnormality. Musculoskeletal: Strandy edema and soft tissue thickening in the left lower neck. Scattered foci of left neck soft tissue air. These inflammatory changes extend inferiorly to the left sternoclavicular joint which has similar surrounding soft tissue thickening, edema, and foci of gas. Air also present in the right sternoclavicular joint but to a lesser degree. Foci of gas are present in the proximal clavicle. Findings compatible with  infectious process and concern for involvement of the sternoclavicular joints. No associated drainable fluid collection or joint effusion. There is also overlying subcutaneous edema and skin thickening. Review of the MIP images confirms the above findings. IMPRESSION: Negative for significant acute pulmonary embolus by CTA. Right middle lobe and bibasilar consolidative opacities compatible with multifocal pneumonia. Left inferior neck and sternoclavicular joint inflammatory changes with scattered foci of air within the soft tissues, joint spaces, and left clavicle, compatible with localized cellulitis/septic arthritis. Again, no drainable fluid collection or large joint effusion. Electronically Signed   By: Judie Petit.  Shick M.D.   On: 11/22/2021 18:09   DG Chest Portable 1 View  Result Date: 11/22/2021 CLINICAL DATA:  Chest pain EXAM: PORTABLE CHEST 1 VIEW COMPARISON:  None. FINDINGS: The heart size and mediastinal contours are within normal limits. Both lungs are clear. The visualized skeletal structures are unremarkable. IMPRESSION: No active disease. Electronically Signed   By: Wiliam Ke M.D.   On: 11/22/2021 17:24     EKG: Independently reviewed, with result as described above.    Assessment/Plan    Principal Problem:   CAP (community acquired pneumonia) Active Problems:   Type 2 diabetes mellitus (HCC)   High anion gap metabolic acidosis   Septic arthritis (HCC)   Transaminitis      #) Community-acquired pneumonia: Diagnosis of the basis of 2 to 3 days of progressive new onset cough associated with subjective fever, chills, with CTA chest showing evidence of right middle lobe as well as bibasilar airspace opacities consistent with multifocal pneumonia.  Of note, presentation associated with mild tachycardia and tachypnea, but in the absence of any objective fever or leukocytosis, SIRS criteria not currently met for sepsis.  No evidence of hypotension.  Initial lactate mildly elevated 2.3,  likely multifactorial in nature, as further detailed below, but in the absence of formal criteria for sepsis as noted above.  Repeat lactate ordered, with result currently pending.  Blood cultures x2 were collected in the ED today prior to initiation of IV vancomycin and cefepime. Will continue IV vancomycin, although the indication for its continuation is more so on the basis of concern for septic arthritis of the left sternoclavicular joint, as further detailed below.  In terms of coverage specific to community-acquired pneumonia, will initiate Rocephin and azithromycin, with the Rocephin component is also providing coverage for concomitant suspected septic arthritis of the left sternoclavicular joint.  will hold additional cefepime.    Plan: Follow-up for repeat lactic acid level.  Continuous normal saline.  Follow for results of blood cultures x2.  Continue IV vancomycin, as above.  Rocephin and azithromycin.  Add on procalcitonin.  Check strep pneumoniae urine antigen as well as Legionella urine antigen, particular in the setting of concomitant acute transaminitis.  Add on mycoplasma antibodies.  Repeat CBC and CMP in the morning.  Add on serum phosphorus level.  Check urinalysis.  Prn Tessalon Perles.  Check BNP.  Flutter valve, incentive spirometry.  MRSA PCR.        #) Septic arthritis of the left sternoclavicular joint: In the setting of 1 to 2 days of new onset erythema over the left anterior chest wall, just lateral to midline, present CTA chest shows evidence of left sternoclavicular joint inflammatory changes with scattered foci of air in the left cerebrovascular joint space, compatible with left sternoclavicular septic arthritis with overlying cellulitis, in the absence of any crepitus on exam to suggest necrotizing fasciitis.  Case discussed with on-call CT surgeon, as above, who felt that case and imaging were consistent with septic arthritis of the left sternoclavicular joint and  recommended medical management with IV antibiotics, without need for additional dedicated imaging at this time, noting the absence of any drainable fluid collection or joint effusion to warrant surgical drainage via CT surgery, as further detailed above.  Criteria for sepsis not currently met in the absence of any objective fever or leukocytosis, as further detailed above.  will proceed with IV vancomycin and Rocephin, as further detailed above.  In setting of presenting subjective fever, chills, bilateral pneumonia, erythematous rash, and suggestion of septic arthritis, also check urinary drug screen.  Plan: IV fluids, as above.  Repeat lactic acid level.  Follow-up results blood cultures x2.  IV vancomycin and Rocephin, as above.  Check CRP and ESR.  Check UDS.  Repeat CBC with differential in the morning.  I have placed nursing communication order requesting that current distribution of erythema along the left anterior chest wall be outlined.  CMP in the morning.        #) Anion gap metabolic acidosis: Presenting CMP reflects mild anion gap metabolic acidosis, likely multifactorial in nature, with contributions from multifactorial presenting infections, as above, will noting at criteria for formal sepsis are not currently met, as further detailed above.  Previous contribution from mild lactic acidosis, with presenting lactate 2.3, noted above.  Receiving interval IV fluids, with repeat lactate level currently pending.  Additional contributions to presenting mild lactic acidosis.  As well as ensuing anion gap metabolic acidosis suspected to include dehydration in the context of acute renal azotemia without concomitant acute kidney injury.  Of note, CPK level nonelevated.  Of note, patient does have a history of type 2 diabetes mellitus, and presenting blood sugar mildly elevated in the 300s, although, clinically, presentation does not appear consistent with DKA.  will closely monitor ensuing blood sugars  and provide IV fluids as detailed below.   Plan: Continuous IV fluids, as above.  Repeat lactate.  Repeat CMP and CBC in the morning.  Further evaluation management of suspected community-acquired pneumonia as well as septic arthritis of the left sternoclavicular joint, including associated IV antibiotic administration, scheduled above.  Accu-Cheks before every meal and at bedtime with low-dose sliding scale insulin.  Check salicylate level.  Check INR.  Check urinary drug screen.  Monitor strict I's and O's daily weights.  Check UA.        #) Acute transaminitis: Presenting labs with mild acute transaminitis relative to most recent prior liver enzymes obtained in February 2023, as quantified above.  Associated with very mildly elevated alkaline phosphatase and total bilirubin, but not to the extent that would be associated with cholestatic pattern at this time.  Denies any associated abdominal discomfort, nor any recent acute GI symptoms.  No appearance of jaundice, hypotension, altered mental status.  Etiology not entirely clear at  this time.  Will continue to closely trend ensuing liver enzymes, which, if see data points continue to trend upwards, or if there is an interval change in clinical status, will consider pursuit of dedicated abdominal imaging as well as consideration for the addition of anaerobic coverage, such as Flagyl, to existing antibiotic regimen for presenting community-acquired pneumonia as well as septic arthritis of the left sternoclavicular joint, as above.  Of note, COVID-19/influenza PCR negative.    Plan:  Repeat CMP in the morning.  Check GGT and direct bilirubin to further qualify borderline cholestatic labs.  Check INR, acute viral hepatitis panel.  Check urinary drug screen.  Check LDH.  Check acute viral hepatitis panel, acetaminophen level.  Hold home statin for now.         #) Type 2 Diabetes Mellitus: documented history of such. Home insulin regimen: None.  Home oral hypoglycemic agents: Glipizide, dapagliflozin. presenting blood sugar: 368, with suspected hyperglycemic contribution from presenting multifactorial infectious processes, as outlined above, in addition to element of dehydration as evidenced by presence of acute prerenal azotemia.  Clinically, associated DKA appears less likely, as further detailed above.  Most recent hemoglobin A1c noted to be 8.0% when checked in February 2023.  Plan: accuchecks QAC and HS with low dose SSI.  IV fluids, as above. hold home oral hypoglycemic agents during this hospitalization.  Holding home statin for now in the setting of presenting mild acute transaminitis, as above.        DVT prophylaxis: SCD's   Code Status: Full code Family Communication: case discussed with patient's wife, as above Disposition Plan: Per Rounding Team Consults called: case/imaging discussed with on-call CT surgery, Dr. Cliffton Asters, As further detailed above;  Admission status: Inpatient   PLEASE NOTE THAT DRAGON DICTATION SOFTWARE WAS USED IN THE CONSTRUCTION OF THIS NOTE.   Chaney Born Keirstan Iannello DO [facility] From 7PM - 7AM   11/22/2021, 8:12 PM

## 2021-11-22 NOTE — ED Notes (Signed)
ED TO INPATIENT HANDOFF REPORT ? ?ED Nurse Name and Phone #: [phone] [name], RN  ? ?S ?Name/Age/Gender ?David Strong ?66 y.o. ?male ?Room/Bed: WA13/WA13 ? ?Code Status ?  Code Status: Full Code ? ?Home/SNF/Other ?Home ?Patient oriented to: self, place, time, and situation ?Is this baseline? Yes  ? ?Triage Complete: Triage complete  ?Chief Complaint ?CAP (community acquired pneumonia) [J18.9] ? ?Triage Note ?Patient brought in via ems from PCP. Patient c/o progressive extreme muscle and joint pain. Patient has been sick withURI for about 1 week. Also c/o red area on chest that is painful  ? ?Allergies ?Allergies  ?Allergen Reactions  ? Codeine Itching  ? Glipizide Er [Glipizide]   ?  SOB, malaise. He tolerates XL without issues.  ? Metformin And Related Rash  ? ? ?Level of Care/Admitting Diagnosis ?ED Disposition   ? ? ED Disposition  ?Admit  ? Condition  ?--  ? Comment  ?Hospital Area: [facility] [[id number]] ? Level of Care: Progressive [102] ? Admit to Progressive based on following criteria: MULTISYSTEM THREATS such as stable sepsis, metabolic/electrolyte imbalance with or without encephalopathy that is responding to early treatment. ? May admit patient to [facility] or [facility] if equivalent level of care is available:: No ? Covid Evaluation: Confirmed COVID Negative ? Diagnosis: CAP (community acquired pneumonia) [856314] ? Admitting Physician: Angie Fava [[id number]] ? Attending Physician: Angie Fava [[id number]] ? Estimated length of stay: past midnight tomorrow ? Certification:: I certify this patient will need inpatient services for at least 2 midnights ?  ?  ? ?  ? ? ?B ?Medical/Surgery History ?Past Medical History:  ?Diagnosis Date  ? Diabetes mellitus without complication (HCC)   ? ?History reviewed. No pertinent surgical history.  ? ?A ?IV Location/Drains/Wounds ?Patient Lines/Drains/Airways Status   ? ? Active Line/Drains/Airways   ? ? Name Placement date Placement  time Site Days  ? Peripheral IV 11/22/21 20 G Left Forearm 11/22/21  1721  Forearm  less than 1  ? ?  ?  ? ?  ? ? ?Intake/Output Last 24 hours ?No intake or output data in the 24 hours ending 11/22/21 1927 ? ?Labs/Imaging ?Results for orders placed or performed during the hospital encounter of 11/22/21 (from the past 48 hour(s))  ?CBC with Differential     Status: Abnormal  ? Collection Time: 11/22/21  4:08 PM  ?Result Value Ref Range  ? WBC 7.4 4.0 - 10.5 K/uL  ? RBC 4.91 4.22 - 5.81 MIL/uL  ? Hemoglobin 14.5 13.0 - 17.0 g/dL  ? HCT 42.7 39.0 - 52.0 %  ? MCV 87.0 80.0 - 100.0 fL  ? MCH 29.5 26.0 - 34.0 pg  ? MCHC 34.0 30.0 - 36.0 g/dL  ? RDW 13.7 11.5 - 15.5 %  ? Platelets 88 (L) 150 - 400 K/uL  ?  Comment: SPECIMEN CHECKED FOR CLOTS ?Immature Platelet Fraction may be ?clinically indicated, consider ?ordering this additional test ?[id number] ?REPEATED TO VERIFY ?PLATELET COUNT CONFIRMED BY SMEAR ?  ? nRBC 0.0 0.0 - 0.2 %  ? Neutrophils Relative % 81 %  ? Neutro Abs 6.0 1.7 - 7.7 K/uL  ? Lymphocytes Relative 12 %  ? Lymphs Abs 0.9 0.7 - 4.0 K/uL  ? Monocytes Relative 7 %  ? Monocytes Absolute 0.5 0.1 - 1.0 K/uL  ? Eosinophils Relative 0 %  ? Eosinophils Absolute 0.0 0.0 - 0.5 K/uL  ? Basophils Relative 0 %  ? Basophils Absolute 0.0 0.0 - 0.1  K/uL  ? Abs Immature Granulocytes 0.00 0.00 - 0.07 K/uL  ? Reactive, Benign Lymphocytes PRESENT   ? Burr Cells PRESENT   ?  Comment: Performed at [facility], [address] [place], [place] [address]  ?Comprehensive metabolic panel     Status: Abnormal  ? Collection Time: 11/22/21  4:08 PM  ?Result Value Ref Range  ? Sodium 131 (L) 135 - 145 mmol/L  ? Potassium 3.5 3.5 - 5.1 mmol/L  ? Chloride 95 (L) 98 - 111 mmol/L  ? CO2 20 (L) 22 - 32 mmol/L  ? Glucose, Bld 368 (H) 70 - 99 mg/dL  ?  Comment: Glucose reference range applies only to samples taken after fasting for at least 8 hours.  ? BUN 24 (H) 8 - 23 mg/dL  ? Creatinine, Ser 0.88 0.61 - 1.24 mg/dL  ?  Calcium 8.4 (L) 8.9 - 10.3 mg/dL  ? Total Protein 6.2 (L) 6.5 - 8.1 g/dL  ? Albumin 2.9 (L) 3.5 - 5.0 g/dL  ? AST 66 (H) 15 - 41 U/L  ? ALT 55 (H) 0 - 44 U/L  ? Alkaline Phosphatase 151 (H) 38 - 126 U/L  ? Total Bilirubin 1.6 (H) 0.3 - 1.2 mg/dL  ? GFR, Estimated >60 >60 mL/min  ?  Comment: (NOTE) ?Calculated using the CKD-EPI Creatinine Equation (2021) ?  ? Anion gap 16 (H) 5 - 15  ?  Comment: Performed at [facility], [address] [place], [place] [address]  ?Troponin I (High Sensitivity)     Status: None  ? Collection Time: 11/22/21  4:08 PM  ?Result Value Ref Range  ? Troponin I (High Sensitivity) 10 <18 ng/L  ?  Comment: (NOTE) ?Elevated high sensitivity troponin I (hsTnI) values and significant  ?changes across serial measurements may suggest ACS but many other  ?chronic and acute conditions are known to elevate hsTnI results.  ?Refer to the "Links" section for chest pain algorithms and additional  ?guidance. ?Performed at [facility], [address] ?[place], [place] [address] ?  ?CK     Status: None  ? Collection Time: 11/22/21  4:08 PM  ?Result Value Ref Range  ? Total CK 216 49 - 397 U/L  ?  Comment: Performed at [facility], [address] [place], [place] [address]  ?D-dimer, quantitative     Status: Abnormal  ? Collection Time: 11/22/21  4:08 PM  ?Result Value Ref Range  ? D-Dimer, Quant 5.37 (H) 0.00 - 0.50 ug/mL-FEU  ?  Comment: (NOTE) ?At the manufacturer cut-off value of 0.5 ?g/mL FEU, this assay has a ?negative predictive value of 95-100%.This assay is intended for use ?in conjunction with a clinical pretest probability (PTP) assessment ?model to exclude pulmonary embolism (PE) and deep venous thrombosis ?(DVT) in outpatients suspected of PE or DVT. ?Results should be correlated with clinical presentation. ?Performed at [facility], [address] ?[place], [place] [address] ?  ?Lactic acid, plasma     Status:  Abnormal  ? Collection Time: 11/22/21  4:09 PM  ?Result Value Ref Range  ? Lactic Acid, Venous 2.3 (HH) 0.5 - 1.9 mmol/L  ?  Comment: CRITICAL RESULT CALLED TO, READ BACK BY AND VERIFIED WITH: ?LIVINGSTON,K. EMTP AT 1710 11/22/21 MULLINS,T ?Performed at [facility], [address] [place], [place] [address] ?  ?Resp Panel by RT-PCR (Flu A&B, Covid)     Status: None  ? Collection Time: 11/22/21  4:25 PM  ? Specimen: Nasopharyngeal(NP) swabs in vial transport medium  ?Result Value Ref Range  ? SARS Coronavirus 2 by RT PCR NEGATIVE NEGATIVE  ?  Comment: (NOTE) ?SARS-CoV-2 target nucleic acids are NOT DETECTED. ? ?The SARS-CoV-2 RNA is generally detectable in upper respiratory ?specimens during the acute phase of infection. The lowest ?concentration of SARS-CoV-2 viral copies this assay can detect is ?138 copies/mL. A negative result does not preclude SARS-Cov-2 ?infection and should not be used as the sole basis for treatment or ?other patient management decisions. A negative result may occur with  ?improper specimen collection/handling, submission of specimen other ?than nasopharyngeal swab, presence of viral mutation(s) within the ?areas targeted by this assay, and inadequate number of viral ?copies(<138 copies/mL). A negative result must be combined with ?clinical observations, patient history, and epidemiological ?information. The expected result is Negative. ? ?Fact Sheet for Patients:  ?BloggerCourse.com ? ?Fact Sheet for Healthcare Providers:  ?SeriousBroker.it ? ?This test is no t yet approved or cleared by the [place] FDA and  ?has been authorized for detection and/or diagnosis of SARS-CoV-2 by ?FDA under an Emergency Use Authorization (EUA). This EUA will remain  ?in effect (meaning this test can be used) for the duration of the ?COVID-19 declaration under Section 564(b)(1) of the Act, 21 ?U.S.C.section 360bbb-3(b)(1), unless the  authorization is terminated  ?or revoked sooner.  ? ? ?  ? Influenza A by PCR NEGATIVE NEGATIVE  ? Influenza B by PCR NEGATIVE NEGATIVE  ?  Comment: (NOTE) ?The Xpert Xpress SARS-CoV-2/FLU/RSV plus assay is inten

## 2021-11-22 NOTE — ED Triage Notes (Signed)
Patient brought in via ems from PCP. Patient c/o progressive extreme muscle and joint pain. Patient has been sick withURI for about 1 week. Also c/o red area on chest that is painful ?

## 2021-11-22 NOTE — ED Provider Notes (Signed)
?[facility]-EMERGENCY DEPT ?Provider Note ? ? ?CSN: [id number] ?Arrival date & time: 11/22/21  1542 ? ?  ? ?History ? ?Chief Complaint  ?Patient presents with  ? Muscle Pain  ? ? ?David Strong is a 66 y.o. male.  Presents to the emergency department with concern for multiple different complaints including cough, body aches, joint aches, shortness of breath, redness to his chest.  All of his symptoms started about a week ago, initially mild upper respiratory illness but he feels like his body aches, joint aches have been worsening.  Has had persistent cough.  A few days ago he had an episode of blood in his cough, specks of blood in sputum.  No large chunks of blood.  No blood in cough today.  No chest pain.  Over the last day or 2 he has noted redness across his chest wall. ? ?Went to primary care office and was advised to come to ER for further testing. ? ?Reviewed note from [facility] nurse practitioner at [facility] primary care, concern for possible early sepsis, skin infection, sent to ER for further eval. ? ?HPI ? ?  ? ?Home Medications ?Prior to Admission medications   ?Medication Sig Start Date End Date Taking? Authorizing Provider  ?dapagliflozin propanediol (FARXIGA) 10 MG TABS tablet Take 1 tablet (10 mg total) by mouth daily before breakfast. ?Patient not taking: Reported on 11/22/2021 10/12/21 01/10/22  [name], MD  ?glipiZIDE (GLIPIZIDE XL) 10 MG 24 hr tablet Take 2 tablets (20 mg total) by mouth 2 (two) times daily with a meal. 05/08/21 11/22/21  Sagardia, Eilleen Kempf, MD  ?glucose blood (ACCU-CHEK AVIVA PLUS) test strip Use to test blood glucose daily. 07/20/21   [name], MD  ?rosuvastatin (CRESTOR) 10 MG tablet Take 1 tablet (10 mg total) by mouth daily. 01/14/20   Noni Saupe, MD  ?sildenafil (VIAGRA) 100 MG tablet Take 0.5-1 tablets (50-100 mg total) by mouth daily as needed for erectile dysfunction. 10/12/21   [name], MD  ?   ? ?Allergies     ?Codeine, Glipizide er [glipizide], and Metformin and related   ? ?Review of Systems   ?Review of Systems  ?Constitutional:  Positive for chills, fatigue and fever.  ?HENT:  Negative for ear pain and sore throat.   ?Eyes:  Negative for pain and visual disturbance.  ?Respiratory:  Positive for cough and shortness of breath.   ?Cardiovascular:  Negative for chest pain and palpitations.  ?Gastrointestinal:  Negative for abdominal pain and vomiting.  ?Genitourinary:  Negative for dysuria and hematuria.  ?Musculoskeletal:  Negative for arthralgias and back pain.  ?Skin:  Positive for color change and rash.  ?Neurological:  Negative for seizures and syncope.  ?All other systems reviewed and are negative. ? ?Physical Exam ?Updated Vital Signs ?BP 122/80   Pulse (!) 113   Temp 99.4 ?F (37.4 ?C)   Resp 19   Ht 5\' 4"  (1.626 m)   Wt 78 kg   SpO2 95%   BMI 29.52 kg/m?  ?Physical Exam ?Vitals and nursing note reviewed.  ?Constitutional:   ?   General: He is not in acute distress. ?   Appearance: He is well-developed.  ?HENT:  ?   Head: Normocephalic and atraumatic.  ?Eyes:  ?   Conjunctiva/sclera: Conjunctivae normal.  ?Cardiovascular:  ?   Rate and Rhythm: Regular rhythm. Tachycardia present.  ?   Heart sounds: No murmur heard. ?Pulmonary:  ?   Comments: Mild tachypnea,  slight crackles noted bilaterally ?Chest:  ?   Comments: There is some mild erythema and swelling to the anterior chest wall.  There is no crepitus noted ?Abdominal:  ?   Palpations: Abdomen is soft.  ?   Tenderness: There is no abdominal tenderness.  ?Musculoskeletal:     ?   General: No swelling.  ?   Cervical back: Neck supple.  ?   Comments: Normal joint ROM throughout  ?Skin: ?   General: Skin is warm and dry.  ?   Capillary Refill: Capillary refill takes less than 2 seconds.  ?Neurological:  ?   Mental Status: He is alert.  ?Psychiatric:     ?   Mood and Affect: Mood normal.  ? ? ?ED Results / Procedures / Treatments   ?Labs ?(all labs ordered are  listed, but only abnormal results are displayed) ?Labs Reviewed  ?CBC WITH DIFFERENTIAL/PLATELET - Abnormal; Notable for the following components:  ?    Result Value  ? Platelets 88 (*)   ? All other components within normal limits  ?COMPREHENSIVE METABOLIC PANEL - Abnormal; Notable for the following components:  ? Sodium 131 (*)   ? Chloride 95 (*)   ? CO2 20 (*)   ? Glucose, Bld 368 (*)   ? BUN 24 (*)   ? Calcium 8.4 (*)   ? Total Protein 6.2 (*)   ? Albumin 2.9 (*)   ? AST 66 (*)   ? ALT 55 (*)   ? Alkaline Phosphatase 151 (*)   ? Total Bilirubin 1.6 (*)   ? Anion gap 16 (*)   ? All other components within normal limits  ?D-DIMER, QUANTITATIVE - Abnormal; Notable for the following components:  ? D-Dimer, Quant 5.37 (*)   ? All other components within normal limits  ?LACTIC ACID, PLASMA - Abnormal; Notable for the following components:  ? Lactic Acid, Venous 2.3 (*)   ? All other components within normal limits  ?RESP PANEL BY RT-PCR (FLU A&B, COVID) [id number]  ?CULTURE, BLOOD (ROUTINE X 2)  ?CULTURE, BLOOD (ROUTINE X 2)  ?GROUP A STREP BY PCR  ?CK  ?LACTIC ACID, PLASMA  ?TROPONIN I (HIGH SENSITIVITY)  ?TROPONIN I (HIGH SENSITIVITY)  ? ? ?EKG ?EKG Interpretation ? ?Date/Time:  Wednesday November 22 2021 16:01:49 EDT ?Ventricular Rate:  117 ?PR Interval:  159 ?QRS Duration: 78 ?QT Interval:  310 ?QTC Calculation: 433 ?R Axis:   52 ?Text Interpretation: Sinus tachycardia Probable left atrial enlargement Confirmed by Marianna Fuss ([id number]) on 11/22/2021 4:33:11 PM ? ?Radiology ?CT Angio Chest PE W and/or Wo Contrast ? ?Result Date: 11/22/2021 ?CLINICAL DATA:  Upper respiratory central lines, progressive for 1 week, chest pain anteriorly EXAM: CT ANGIOGRAPHY CHEST WITH CONTRAST TECHNIQUE: Multidetector CT imaging of the chest was performed using the standard protocol during bolus administration of intravenous contrast. Multiplanar CT image reconstructions and MIPs were obtained to evaluate the vascular anatomy. RADIATION DOSE  REDUCTION: This exam was performed according to the departmental dose-optimization program which includes automated exposure control, adjustment of the mA and/or kV according to patient size and/or use of iterative reconstruction technique. CONTRAST:  55mL OMNIPAQUE IOHEXOL 350 MG/ML SOLN COMPARISON:  11/22/2021 chest x-ray FINDINGS: Cardiovascular: The visualized central and proximal hilar pulmonary arteries are normal in caliber and appear patent. No significant large acute filling defect or pulmonary embolus by CTA. Normal heart size.  No pericardial effusion. Intact thoracic aorta. No aneurysm or dissection. Patent 3 vessel arch anatomy. Central venous structures are  patent. No veno-occlusive process within the chest. Mediastinum/Nodes: No enlarged mediastinal, hilar, or axillary lymph nodes. Thyroid gland, trachea, and esophagus demonstrate no significant findings. Lungs/Pleura: Dense bibasilar consolidative opacities with central air bronchograms. Central right middle low patchy bronchovascular airspace opacities as well. Findings compatible with multilobar pneumonia. No pleural abnormality, effusion, or pneumothorax. Upper Abdomen: No acute abnormality. Musculoskeletal: Strandy edema and soft tissue thickening in the left lower neck. Scattered foci of left neck soft tissue air. These inflammatory changes extend inferiorly to the left sternoclavicular joint which has similar surrounding soft tissue thickening, edema, and foci of gas. Air also present in the right sternoclavicular joint but to a lesser degree. Foci of gas are present in the proximal clavicle. Findings compatible with infectious process and concern for involvement of the sternoclavicular joints. No associated drainable fluid collection or joint effusion. There is also overlying subcutaneous edema and skin thickening. Review of the MIP images confirms the above findings. IMPRESSION: Negative for significant acute pulmonary embolus by CTA. Right  middle lobe and bibasilar consolidative opacities compatible with multifocal pneumonia. Left inferior neck and sternoclavicular joint inflammatory changes with scattered foci of air within the soft tissues, joi

## 2021-11-22 NOTE — Assessment & Plan Note (Addendum)
Lactic acidosis ?Secondary to severe sepsis. ?Presented with lactate of 2.3 which has improved to 2.1.  Bicarbonate normal.  Anion gap has normalized. ?Resolved. ?

## 2021-11-22 NOTE — Progress Notes (Signed)
? ?  Subjective:  ? ? Patient ID: David Strong, male    DOB: [birth year], 67 y.o.   MRN: [id number] ? ?HPI ?Chief Complaint  ?Patient presents with  ? Cough  ?  Family had flu 2 weeks ago. Since 3/30 productive cough, 4/1 noticed blood in mucus but is not still present. Swollen and dry throat (not able to swallow). Not able to turn neck. ? ?Negative COVID test.  ? ?He is here with complaints of a 7 day history of cough that has been productive and blood tinged. He has gradually worsened with joint pain all over, neck stiffness, erythema and warmth of chest wall, difficulty swallowing and pain with breathing.  ? ?He is unable to ambulate with right arm weakness and bilateral leg weakness.  ? ?Past Medical History:  ?Diagnosis Date  ? Diabetes mellitus without complication ([place])   ? ?Current Outpatient Medications on File Prior to Visit  ?Medication Sig Dispense Refill  ? glipiZIDE (GLIPIZIDE XL) 10 MG 24 hr tablet Take 2 tablets (20 mg total) by mouth 2 (two) times daily with a meal. 360 tablet 1  ? glucose blood (ACCU-CHEK AVIVA PLUS) test strip Use to test blood glucose daily. 100 each 3  ? rosuvastatin (CRESTOR) 10 MG tablet Take 1 tablet (10 mg total) by mouth daily. 90 tablet 3  ? sildenafil (VIAGRA) 100 MG tablet Take 0.5-1 tablets (50-100 mg total) by mouth daily as needed for erectile dysfunction. 5 tablet 11  ? dapagliflozin propanediol (FARXIGA) 10 MG TABS tablet Take 1 tablet (10 mg total) by mouth daily before breakfast. (Patient not taking: Reported on 11/22/2021) 90 tablet 3  ? ?No current facility-administered medications on file prior to visit.  ? ? ? ? ?Review of Systems ?Pertinent positives and negatives in the history of present illness. ? ?   ?Objective:  ? Physical Exam ?Constitutional:   ?   General: He is in acute distress.  ?   Appearance: He is ill-appearing.  ?Cardiovascular:  ?   Rate and Rhythm: Tachycardia present.  ?Pulmonary:  ?   Effort: Pulmonary effort is normal.  ?Chest:  ?   Chest wall:  Mass, swelling and tenderness present.  ? ? ?   Comments: Anterior neck, left upper chest wall with erythema, increased warmth and edema. The entire area is tender to palpation.  ?Musculoskeletal:  ?   Cervical back: Rigidity present.  ?Neurological:  ?   Mental Status: He is alert.  ?   Motor: Weakness present.  ?   Comments: Right arm and hand weakness.   ? ?BP 116/86 (BP Location: Left Arm, Patient Position: Sitting, Cuff Size: Large)   Pulse (!) 120   Temp 98 ?F (36.7 ?C) (Temporal)   SpO2 93%  ? ? ? ? ?   ?Assessment & Plan:  ?Cellulitis of chest wall ? ?Productive cough ? ?Neck stiffness ? ?Unable to ambulate ? ?Right arm weakness ? ?Tachycardia ? ?Suspect early sepsis. Possible abscess to neck or chest wall. He is normally able to walk and move all extremities without difficulty. Today he is in a wheelchair and unable to stand. Rapid deterioration per wife.  ?Dr. Ronnald Ramp also examined patient and agrees that we should call EMS for transport to the ED for further evaluation and treatment.  ? ?

## 2021-11-22 NOTE — Assessment & Plan Note (Addendum)
Left sternoclavicular joint septic arthritis ?Has other polyarthralgias/arthritis (right shoulder and left knee) ?CTA chest report as noted above. ?On admission, case was discussed by EDP with on-call CT surgeon who felt that case and imaging were consistent with septic arthritis of the left sternoclavicular joint and recommended medical management and IV antibiotics without need for any surgical intervention. ?Per ultrasound, minimal fluid in left Moca joint, IR did not think would be high yield and moreover patient declined aspiration. ?Still has significant right shoulder area pain.  This may be multifactorial related to SVT and may be septic arthritis. ?As per ID follow-up, ordered left ankle MRI (suggestive of cellulitis and not septic arthritis) and MRI right shoulder (patient could not do it same time last night and is to be done). ?

## 2021-11-22 NOTE — Progress Notes (Signed)
Pharmacy Antibiotic Note ? ?David Strong is a 66 y.o. male admitted on 11/22/2021 with pneumonia, possible cellulitis/septic arthritis.  Pharmacy has been consulted for vancomycin dosing. ? ?Plan: ?Vancomycin 1750mg  IV q24h for estimated AUC 498, Cmin 10.1, using SCr 0.88 ?Check vancomycin levels as needed, goal AUC 400-550 ?Follow up renal function & cultures ? ?Height: 5\' 4"  (162.6 cm) ?Weight: 78 kg (172 lb) ?IBW/kg (Calculated) : 59.2 ? ?Temp (24hrs), Avg:98.7 ?F (37.1 ?C), Min:98 ?F (36.7 ?C), Max:99.4 ?F (37.4 ?C) ? ?Recent Labs  ?Lab 11/22/21 ?1608 11/22/21 ?1609  ?WBC 7.4  --   ?CREATININE 0.88  --   ?LATICACIDVEN  --  2.3*  ?  ?Estimated Creatinine Clearance: 79 mL/min (by C-G formula based on SCr of 0.88 mg/dL).   ? ?Allergies  ?Allergen Reactions  ? Codeine Itching  ? Glipizide Er [Glipizide]   ?  SOB, malaise. He tolerates XL without issues.  ? Metformin And Related Rash  ? ? ?Antimicrobials this admission: ?4/5 Cefepime x 1 ?4/5 Vancomycin >> ?4/5 Ceftriaxone >> ? ?Dose adjustments this admission: ? ?Microbiology results: ?68/5 BCx: ?4/5 Group A Strep PCR: ? ?Thank you for allowing pharmacy to be a part of this patient?s care. ? ?01/22/22, PharmD, BCPS ?Pharmacy: 6/11 ?11/22/2021 7:37 PM ? ?

## 2021-11-22 NOTE — Assessment & Plan Note (Addendum)
Mild and likely related to severe sepsis. ?No GI symptoms. ?Acetaminophen level 14, salicylate level less than 7 and acute hepatitis panel negative.  UDS positive only for opiates. ?LFTs are actually slowly creeping up.?  Related to antibiotics or sepsis itself. ?LFTs slightly better today.  No GI symptoms. ?RUQ ultrasound with gallbladder sludge and no acute findings. ?Continue to trend daily CMP. ?

## 2021-11-22 NOTE — Assessment & Plan Note (Addendum)
A1c 8 on 10/12/2021. ?On glipizide and dapagliflozin at home.  Currently held. ?CBGs uncontrolled and fluctuating.  Increased Semglee to 18 units daily continue NovoLog SSI moderate sensitivity and increased NovoLog meal time 5 units 3 times daily ?Diabetes coordinator consulted ?Monitor CBGs closely and adjust insulins as needed. ?Widely fluctuating. ?

## 2021-11-23 DIAGNOSIS — E876 Hypokalemia: Secondary | ICD-10-CM

## 2021-11-23 DIAGNOSIS — D696 Thrombocytopenia, unspecified: Secondary | ICD-10-CM

## 2021-11-23 DIAGNOSIS — J189 Pneumonia, unspecified organism: Secondary | ICD-10-CM | POA: Diagnosis not present

## 2021-11-23 DIAGNOSIS — A419 Sepsis, unspecified organism: Secondary | ICD-10-CM | POA: Diagnosis not present

## 2021-11-23 DIAGNOSIS — E8729 Other acidosis: Secondary | ICD-10-CM | POA: Diagnosis not present

## 2021-11-23 DIAGNOSIS — R7881 Bacteremia: Secondary | ICD-10-CM

## 2021-11-23 DIAGNOSIS — B953 Streptococcus pneumoniae as the cause of diseases classified elsewhere: Secondary | ICD-10-CM

## 2021-11-23 DIAGNOSIS — R6520 Severe sepsis without septic shock: Secondary | ICD-10-CM

## 2021-11-23 LAB — COMPREHENSIVE METABOLIC PANEL
ALT: 45 U/L — ABNORMAL HIGH (ref 0–44)
AST: 58 U/L — ABNORMAL HIGH (ref 15–41)
Albumin: 2.3 g/dL — ABNORMAL LOW (ref 3.5–5.0)
Alkaline Phosphatase: 139 U/L — ABNORMAL HIGH (ref 38–126)
Anion gap: 11 (ref 5–15)
BUN: 28 mg/dL — ABNORMAL HIGH (ref 8–23)
CO2: 24 mmol/L (ref 22–32)
Calcium: 7.8 mg/dL — ABNORMAL LOW (ref 8.9–10.3)
Chloride: 98 mmol/L (ref 98–111)
Creatinine, Ser: 1.14 mg/dL (ref 0.61–1.24)
GFR, Estimated: >60 mL/min (ref >60)
Glucose, Bld: 377 mg/dL — ABNORMAL HIGH (ref 70–99)
Potassium: 3.4 mmol/L — ABNORMAL LOW (ref 3.5–5.1)
Sodium: 133 mmol/L — ABNORMAL LOW (ref 135–145)
Total Bilirubin: 1 mg/dL (ref 0.3–1.2)
Total Protein: 5.6 g/dL — ABNORMAL LOW (ref 6.5–8.1)

## 2021-11-23 LAB — BLOOD CULTURE ID PANEL (REFLEXED) - BCID2

## 2021-11-23 LAB — CBC WITH DIFFERENTIAL/PLATELET
Abs Immature Granulocytes: 0.16 10*3/uL — ABNORMAL HIGH (ref 0.00–0.07)
Basophils Absolute: 0.1 10*3/uL (ref 0.0–0.1)
Basophils Relative: 1 %
Eosinophils Absolute: 0 10*3/uL (ref 0.0–0.5)
Eosinophils Relative: 0 %
HCT: 39.7 % (ref 39.0–52.0)
Hemoglobin: 13.7 g/dL (ref 13.0–17.0)
Immature Granulocytes: 2 %
Lymphocytes Relative: 7 %
Lymphs Abs: 0.6 10*3/uL — ABNORMAL LOW (ref 0.7–4.0)
MCH: 29.7 pg (ref 26.0–34.0)
MCHC: 34.5 g/dL (ref 30.0–36.0)
MCV: 85.9 fL (ref 80.0–100.0)
Monocytes Absolute: 0.3 10*3/uL (ref 0.1–1.0)
Monocytes Relative: 3 %
Neutro Abs: 7.4 10*3/uL (ref 1.7–7.7)
Neutrophils Relative %: 87 %
Platelets: 89 10*3/uL — ABNORMAL LOW (ref 150–400)
RBC: 4.62 MIL/uL (ref 4.22–5.81)
RDW: 13.6 % (ref 11.5–15.5)
WBC: 8.5 10*3/uL (ref 4.0–10.5)
nRBC: 0 % (ref 0.0–0.2)

## 2021-11-23 LAB — BILIRUBIN, DIRECT: Bilirubin, Direct: 0.7 mg/dL — ABNORMAL HIGH (ref 0.0–0.2)

## 2021-11-23 LAB — URINALYSIS, COMPLETE (UACMP) WITH MICROSCOPIC
Bacteria, UA: NONE SEEN
Bilirubin Urine: NEGATIVE
Glucose, UA: >=500 mg/dL — AB
Hgb urine dipstick: NEGATIVE
Ketones, ur: 20 mg/dL — AB
Leukocytes,Ua: NEGATIVE
Nitrite: NEGATIVE
Protein, ur: 30 mg/dL — AB
Specific Gravity, Urine: 1.04 — ABNORMAL HIGH (ref 1.005–1.030)
pH: 5 (ref 5.0–8.0)

## 2021-11-23 LAB — GLUCOSE, CAPILLARY
Glucose-Capillary: 365 mg/dL — ABNORMAL HIGH (ref 70–99)
Glucose-Capillary: 368 mg/dL — ABNORMAL HIGH (ref 70–99)
Glucose-Capillary: 323 mg/dL — ABNORMAL HIGH (ref 70–99)
Glucose-Capillary: 319 mg/dL — ABNORMAL HIGH (ref 70–99)

## 2021-11-23 LAB — RAPID URINE DRUG SCREEN, HOSP PERFORMED
Amphetamines: NOT DETECTED
Barbiturates: NOT DETECTED
Benzodiazepines: NOT DETECTED
Cocaine: NOT DETECTED
Opiates: POSITIVE — AB
Tetrahydrocannabinol: NOT DETECTED

## 2021-11-23 LAB — HEMOGLOBIN A1C
Hgb A1c MFr Bld: 8.6 % — ABNORMAL HIGH (ref 4.8–5.6)
Mean Plasma Glucose: 200.12 mg/dL

## 2021-11-23 LAB — GAMMA GT: GGT: 141 U/L — ABNORMAL HIGH (ref 7–50)

## 2021-11-23 LAB — PROTIME-INR
INR: 1.2 (ref 0.8–1.2)
Prothrombin Time: 14.6 seconds (ref 11.4–15.2)

## 2021-11-23 LAB — BRAIN NATRIURETIC PEPTIDE: B Natriuretic Peptide: 169.4 pg/mL — ABNORMAL HIGH (ref 0.0–100.0)

## 2021-11-23 LAB — MRSA NEXT GEN BY PCR, NASAL: MRSA by PCR Next Gen: NOT DETECTED

## 2021-11-23 LAB — SEDIMENTATION RATE: Sed Rate: 68 mm/hr — ABNORMAL HIGH (ref 0–16)

## 2021-11-23 LAB — LACTIC ACID, PLASMA: Lactic Acid, Venous: 2.1 mmol/L (ref 0.5–1.9)

## 2021-11-23 LAB — LACTATE DEHYDROGENASE: LDH: 256 U/L — ABNORMAL HIGH (ref 98–192)

## 2021-11-23 LAB — C-REACTIVE PROTEIN: CRP: 43.4 mg/dL — ABNORMAL HIGH (ref <1.0)

## 2021-11-23 LAB — MAGNESIUM: Magnesium: 2.7 mg/dL — ABNORMAL HIGH (ref 1.7–2.4)

## 2021-11-23 LAB — PHOSPHORUS: Phosphorus: 2.4 mg/dL — ABNORMAL LOW (ref 2.5–4.6)

## 2021-11-23 LAB — STREP PNEUMONIAE URINARY ANTIGEN: Strep Pneumo Urinary Antigen: POSITIVE — AB

## 2021-11-23 MED ORDER — INSULIN ASPART 100 UNIT/ML IJ SOLN
0.0000 [IU] | Freq: Three times a day (TID) | INTRAMUSCULAR | Status: DC
  Administered 2021-11-23: 11 [IU] via SUBCUTANEOUS
  Administered 2021-11-23: 15 [IU] via SUBCUTANEOUS
  Administered 2021-11-24: 5 [IU] via SUBCUTANEOUS
  Administered 2021-11-24 (×2): 8 [IU] via SUBCUTANEOUS
  Administered 2021-11-25: 5 [IU] via SUBCUTANEOUS
  Administered 2021-11-25: 8 [IU] via SUBCUTANEOUS
  Administered 2021-11-25: 11 [IU] via SUBCUTANEOUS
  Administered 2021-11-26: 2 [IU] via SUBCUTANEOUS
  Administered 2021-11-26: 3 [IU] via SUBCUTANEOUS
  Administered 2021-11-26: 5 [IU] via SUBCUTANEOUS
  Administered 2021-11-27: 3 [IU] via SUBCUTANEOUS
  Administered 2021-11-27: 8 [IU] via SUBCUTANEOUS
  Administered 2021-11-27 – 2021-11-28 (×2): 5 [IU] via SUBCUTANEOUS
  Administered 2021-11-28 (×2): 8 [IU] via SUBCUTANEOUS
  Administered 2021-11-29 (×2): 5 [IU] via SUBCUTANEOUS

## 2021-11-23 MED ORDER — INSULIN GLARGINE-YFGN 100 UNIT/ML ~~LOC~~ SOLN
10.0000 [IU] | Freq: Every day | SUBCUTANEOUS | Status: DC
  Administered 2021-11-23: 10 [IU] via SUBCUTANEOUS
  Filled 2021-11-23: qty 0.1

## 2021-11-23 MED ORDER — POTASSIUM & SODIUM PHOSPHATES 280-160-250 MG PO PACK
1.0000 | PACK | Freq: Three times a day (TID) | ORAL | Status: AC
  Administered 2021-11-23 (×3): 1 via ORAL
  Filled 2021-11-23 (×3): qty 1

## 2021-11-23 MED ORDER — ACETAMINOPHEN 325 MG PO TABS
650.0000 mg | ORAL_TABLET | Freq: Four times a day (QID) | ORAL | Status: DC | PRN
  Administered 2021-11-23: 650 mg via ORAL
  Filled 2021-11-23: qty 2

## 2021-11-23 MED ORDER — INSULIN ASPART 100 UNIT/ML IJ SOLN
0.0000 [IU] | Freq: Every day | INTRAMUSCULAR | Status: DC
  Administered 2021-11-23: 4 [IU] via SUBCUTANEOUS
  Administered 2021-11-24: 5 [IU] via SUBCUTANEOUS
  Administered 2021-11-26: 2 [IU] via SUBCUTANEOUS
  Administered 2021-11-27: 3 [IU] via SUBCUTANEOUS
  Administered 2021-11-28: 2 [IU] via SUBCUTANEOUS

## 2021-11-23 MED ORDER — SODIUM CHLORIDE 0.9 % IV SOLN: INTRAVENOUS | Status: DC

## 2021-11-23 MED ORDER — ENSURE MAX PROTEIN PO LIQD
11.0000 [oz_av] | Freq: Two times a day (BID) | ORAL | Status: DC
  Administered 2021-11-23 – 2021-12-17 (×25): 11 [oz_av] via ORAL
  Filled 2021-11-23 (×52): qty 330

## 2021-11-23 NOTE — Assessment & Plan Note (Addendum)
Suspect chronic or acute on chronic ?Platelet counts in 2018: 104 ?Resolved. ?

## 2021-11-23 NOTE — Progress Notes (Signed)
Pt temp trending up. Went from 100, to 100.3, to 100.6. Oral acetaminophen given ?

## 2021-11-23 NOTE — Consult Note (Addendum)
[facility]                                                                                        Patient Identification: Patient Name: David Strong MRN: [id number] Admit Date: 11/22/2021  3:53 PM Today's Date: 11/23/2021 Reason for consult: bacteremia  Requesting provider: Marcellus Scott   Principal Problem:   CAP (community acquired pneumonia) Active Problems:   Type 2 diabetes mellitus (HCC)   High anion gap metabolic acidosis   Septic arthritis (HCC)   Transaminitis   Antibiotics:  Vancomycin 4/5-c Cefepime 4/5-c Ceftriaxone 4/5-c Azithromycin 4/5-c  Lines/Hardware:  Assessment 66 Y O male with h/o Type 2 DM admitted with   # High grade strep pneumoniae bacteremia 2/2 - No symptoms of signs of meningitis  # Multifocal PNA # Possible Left Prosser Joint/Left clavicle Septic arthritis  # Polyarthralgia  # Transaminitis- acute hepatitis panel negative # Thrombocytopenia  Recommendations  -Will change abtx to ceftriaxone only for strep pneumoniae -Repeat 2 sets of blood cultures for 6/6 am ( ordered ) -IR eval for ?left Vanduser joint aspiration/left clavicle biopsy given strep pneumoniae is an uncommon cause of septic arthritis however, can occur. No surgical intervention per CTS. -TTE  -Monitor CBC and CMP -He denies Pneumococcal vaccine in the past but willing to get vaccinated  -Dr Luciana Axe to follow from tomorrow   Rest of the management as per the primary team. Please call with questions or concerns.  Thank you for the consult  Odette Fraction, MD Infectious Disease Physician [facility] [address] [place], [place] [address] Phone: [phone]  Fax: [phone]  __________________________________________________________________________________________________________ HPI and Hospital Course: 70 Y O male with h/o Type 2  DM who was sent to ED from PCP 4/5 in the setting of multiple complaints ( productive cough/generalized pain/neck stiffness/redness and warmth in the upper left chest wall, dysphagia, dyspnea, rt arm and bilateral LE weakness).  His 93 year old son had flu March 24-27 after which his wife got flu like symptoms followed by patient having runny nose, productive cough since 3/30. Phlegm also had streaks of blood initially and was brownish. Associated symptoms include sore throat,  and generalized joint pain. He took different OTC meds as suggested by his family and friends but none of them helped. His wife also noticed redness and warmth in the left lower neck region and had put some ointment. Denies any trauma or insect bite to that area. Also had diffuse joint pains all over which seems to be improving  but most at the rt shoulder/rt elbow and left ankle currently. He has difficulty elevating left leg/rt UE due to pain. Denies any neck stiffness, headache and blurry vision or hearing changes. Denies any hearing changes.   He is originally from [place], moved to [place] in 1984 and lives with wife and children. He works in a IT consultant and was not able to vacuum due to joint pains. Denies any pets at home. Denies smoking, drinks alcohol and denies IVDU. Denies receiving pneumococcal vaccines.   At ED, T max 100.6 Labs remarkable  for BG 368, ALP 151, albumin 2.6, AST 66, ALT 55, TB 1.6, LA 2.3, plts 88  Imagings as below   ROS: all systems reviewed with pertinent positives and negatives as listed above   Past Medical History:  Diagnosis Date   Diabetes mellitus without complication (HCC)    History reviewed. No pertinent surgical history.   Scheduled Meds:  insulin aspart  0-5 Units Subcutaneous QHS   insulin aspart  0-9 Units Subcutaneous TID WC   Continuous Infusions:  sodium chloride 100 mL/hr at 11/23/21 0808   cefTRIAXone (ROCEPHIN)  IV 2 g (11/23/21 0019)   PRN Meds:.acetaminophen  **OR** acetaminophen, benzonatate, morphine injection, naLOXone (NARCAN)  injection  Allergies  Allergen Reactions   Codeine Itching   Glipizide Er [Glipizide]     SOB, malaise. He tolerates XL without issues.   Metformin And Related Rash   Social History   Socioeconomic History   Marital status: Married    Spouse name: Not on file   Number of children: Not on file   Years of education: Not on file   Highest education level: Not on file  Occupational History   Not on file  Tobacco Use   Smoking status: Former   Smokeless tobacco: Never  Substance and Sexual Activity   Alcohol use: Yes    Comment: occasional   Drug use: No   Sexual activity: Yes  Other Topics Concern   Not on file  Social History Narrative   Not on file   Social Determinants of Health   Financial Resource Strain: Not on file  Food Insecurity: Not on file  Transportation Needs: Not on file  Physical Activity: Not on file  Stress: Not on file  Social Connections: Not on file  Intimate Partner Violence: Not on file   Breast Cancer-relatedfamily history is not on file. He was adopted.    Vitals BP 134/85 (BP Location: Right Arm)   Pulse (!) 107   Temp 98.8 F (37.1 C) (Oral)   Resp 20   Ht 5\' 4"  (1.626 m)   Wt 78 kg   SpO2 96%   BMI 29.52 kg/m   Physical Exam Constitutional:  lying in the bed and not in acute distress     Comments: No neck rigidity  Cardiovascular:     Rate and Rhythm: Normal rate and regular rhythm.     Heart sounds:   Pulmonary:     Effort: Pulmonary effort is normal on room air     Comments: Bilateral clear air entry  Abdominal:     Palpations: Abdomen is soft.     Tenderness: non distended and non tender   Musculoskeletal:        General: erythema and swelling above the left Denver joint area. Active ROM in rt UE is limited due to pain but has passive ROM. No signs of septic joint in the rt shoulder and rt elbow. Unable to lift leg from the bed due to pain in the  left ankle. No signs of septic joint in the left ankle. Good ROM in the Rt leg. No signs of septic joint in other peripheral joints.   Skin:    Comments: as above   Neurological:     General: Grossly non focal, awake, alert and oriented.    Psychiatric:        Mood and Affect: Mood normal.    Pertinent Microbiology Results for orders placed or performed during the hospital encounter of 11/22/21  Blood culture (routine  x 2)     Status: None (Preliminary result)   Collection Time: 11/22/21  4:10 PM   Specimen: BLOOD  Result Value Ref Range Status   Specimen Description   Final    BLOOD BLOOD LEFT FOREARM Performed at [facility], [address] [place], [place] [address]    Special Requests   Final    BOTTLES DRAWN AEROBIC AND ANAEROBIC Blood Culture results may not be optimal due to an inadequate volume of blood received in culture bottles Performed at [facility], [address] [place], [place] [address]    Culture  Setup Time   Final    GRAM POSITIVE COCCI IN BOTH AEROBIC AND ANAEROBIC BOTTLES Organism ID to follow CRITICAL RESULT CALLED TO, READ BACK BY AND VERIFIED WITHHaze Boyden PHARMD, AT [phone] 11/23/21 D. VANHOOK Performed at [facility], [address] [place], [place] [address]    Culture GRAM POSITIVE COCCI  Final   Report Status PENDING  Incomplete  Blood Culture ID Panel (Reflexed)     Status: Abnormal   Collection Time: 11/22/21  4:10 PM  Result Value Ref Range Status   Enterococcus faecalis NOT DETECTED NOT DETECTED Final   Enterococcus Faecium NOT DETECTED NOT DETECTED Final   Listeria monocytogenes NOT DETECTED NOT DETECTED Final   Staphylococcus species NOT DETECTED NOT DETECTED Final   Staphylococcus aureus (BCID) NOT DETECTED NOT DETECTED Final   Staphylococcus epidermidis NOT DETECTED NOT DETECTED Final   Staphylococcus lugdunensis NOT DETECTED NOT DETECTED Final   Streptococcus species DETECTED (A) NOT  DETECTED Final    Comment: CRITICAL RESULT CALLED TO, READ BACK BY AND VERIFIED WITHHaze Boyden PHARMD, AT [phone] 11/23/21 D. VANHOOK    Streptococcus agalactiae NOT DETECTED NOT DETECTED Final   Streptococcus pneumoniae DETECTED (A) NOT DETECTED Final    Comment: CRITICAL RESULT CALLED TO, READ BACK BY AND VERIFIED WITHHaze Boyden PHARMD, AT [phone] 11/23/21 D. VANHOOK    Streptococcus pyogenes NOT DETECTED NOT DETECTED Final   A.calcoaceticus-baumannii NOT DETECTED NOT DETECTED Final   Bacteroides fragilis NOT DETECTED NOT DETECTED Final   Enterobacterales NOT DETECTED NOT DETECTED Final   Enterobacter cloacae complex NOT DETECTED NOT DETECTED Final   Escherichia coli NOT DETECTED NOT DETECTED Final   Klebsiella aerogenes NOT DETECTED NOT DETECTED Final   Klebsiella oxytoca NOT DETECTED NOT DETECTED Final   Klebsiella pneumoniae NOT DETECTED NOT DETECTED Final   Proteus species NOT DETECTED NOT DETECTED Final   Salmonella species NOT DETECTED NOT DETECTED Final   Serratia marcescens NOT DETECTED NOT DETECTED Final   Haemophilus influenzae NOT DETECTED NOT DETECTED Final   Neisseria meningitidis NOT DETECTED NOT DETECTED Final   Pseudomonas aeruginosa NOT DETECTED NOT DETECTED Final   Stenotrophomonas maltophilia NOT DETECTED NOT DETECTED Final   Candida albicans NOT DETECTED NOT DETECTED Final   Candida auris NOT DETECTED NOT DETECTED Final   Candida glabrata NOT DETECTED NOT DETECTED Final   Candida krusei NOT DETECTED NOT DETECTED Final   Candida parapsilosis NOT DETECTED NOT DETECTED Final   Candida tropicalis NOT DETECTED NOT DETECTED Final   Cryptococcus neoformans/gattii NOT DETECTED NOT DETECTED Final    Comment: Performed at [facility], [address] [place], [place] [address]  Blood culture (routine x 2)     Status: None (Preliminary result)   Collection Time: 11/22/21  4:15 PM   Specimen: BLOOD  Result Value Ref Range Status   Specimen Description  Final     BLOOD BLOOD LEFT FOREARM Performed at [facility], [address], [place], [place] [address]    Special Requests   Final    BOTTLES DRAWN AEROBIC AND ANAEROBIC Blood Culture results may not be optimal due to an inadequate volume of blood received in culture bottles Performed at [facility], [address], [place], [place] [address]    Culture  Setup Time   Final    GRAM POSITIVE COCCI IN CHAINS IN BOTH AEROBIC AND ANAEROBIC BOTTLES CRITICAL VALUE NOTED.  VALUE IS CONSISTENT WITH PREVIOUSLY REPORTED AND CALLED VALUE. Performed at [facility], [address] [place], [place] [address]    Culture GRAM POSITIVE COCCI IN CHAINS  Final   Report Status PENDING  Incomplete  Resp Panel by RT-PCR (Flu A&B, Covid)     Status: None   Collection Time: 11/22/21  4:25 PM   Specimen: Nasopharyngeal(NP) swabs in vial transport medium  Result Value Ref Range Status   SARS Coronavirus 2 by RT PCR NEGATIVE NEGATIVE Final    Comment: (NOTE) SARS-CoV-2 target nucleic acids are NOT DETECTED.  The SARS-CoV-2 RNA is generally detectable in upper respiratory specimens during the acute phase of infection. The lowest concentration of SARS-CoV-2 viral copies this assay can detect is 138 copies/mL. A negative result does not preclude SARS-Cov-2 infection and should not be used as the sole basis for treatment or other patient management decisions. A negative result may occur with  improper specimen collection/handling, submission of specimen other than nasopharyngeal swab, presence of viral mutation(s) within the areas targeted by this assay, and inadequate number of viral copies(<138 copies/mL). A negative result must be combined with clinical observations, patient history, and epidemiological information. The expected result is Negative.  Fact Sheet for Patients:  BloggerCourse.com  Fact Sheet for Healthcare Providers:   SeriousBroker.it  This test is no t yet approved or cleared by the [place] FDA and  has been authorized for detection and/or diagnosis of SARS-CoV-2 by FDA under an Emergency Use Authorization (EUA). This EUA will remain  in effect (meaning this test can be used) for the duration of the COVID-19 declaration under Section 564(b)(1) of the Act, 21 U.S.C.section 360bbb-3(b)(1), unless the authorization is terminated  or revoked sooner.       Influenza A by PCR NEGATIVE NEGATIVE Final   Influenza B by PCR NEGATIVE NEGATIVE Final    Comment: (NOTE) The Xpert Xpress SARS-CoV-2/FLU/RSV plus assay is intended as an aid in the diagnosis of influenza from Nasopharyngeal swab specimens and should not be used as a sole basis for treatment. Nasal washings and aspirates are unacceptable for Xpert Xpress SARS-CoV-2/FLU/RSV testing.  Fact Sheet for Patients: BloggerCourse.com  Fact Sheet for Healthcare Providers: SeriousBroker.it  This test is not yet approved or cleared by the [place] FDA and has been authorized for detection and/or diagnosis of SARS-CoV-2 by FDA under an Emergency Use Authorization (EUA). This EUA will remain in effect (meaning this test can be used) for the duration of the COVID-19 declaration under Section 564(b)(1) of the Act, 21 U.S.C. section 360bbb-3(b)(1), unless the authorization is terminated or revoked.  Performed at [facility], [address] [place], [place] [address]   Group A Strep by PCR     Status: None   Collection Time: 11/22/21  4:39 PM   Specimen: Throat; Sterile Swab  Result Value Ref Range Status   Group A Strep by PCR NOT DETECTED  NOT DETECTED Final    Comment: Performed at [facility], [address], [place], [place] [address]  MRSA Next Gen by PCR, Nasal     Status: None   Collection Time: 11/22/21 11:30 PM    Specimen: Nasal Mucosa; Nasal Swab  Result Value Ref Range Status   MRSA by PCR Next Gen NOT DETECTED NOT DETECTED Final    Comment: (NOTE) The GeneXpert MRSA Assay (FDA approved for NASAL specimens only), is one component of a comprehensive MRSA colonization surveillance program. It is not intended to diagnose MRSA infection nor to guide or monitor treatment for MRSA infections. Test performance is not FDA approved in patients less than 2 years old. Performed at [facility], [address], [place], [place] [address]     Pertinent Lab seen by me:    Latest Ref Rng & Units 11/23/2021    4:23 AM 11/22/2021    4:08 PM 11/18/2017   11:18 AM  CBC  WBC 4.0 - 10.5 K/uL 8.5   7.4   3.8    Hemoglobin 13.0 - 17.0 g/dL 09.8   11.9   14.7    Hematocrit 39.0 - 52.0 % 39.7   42.7   44.4    Platelets 150 - 400 K/uL 89   88         Latest Ref Rng & Units 11/23/2021    4:23 AM 11/22/2021    4:08 PM 10/12/2021    1:40 PM  CMP  Glucose 70 - 99 mg/dL 829   562   130    BUN 8 - 23 mg/dL 28   24   13     Creatinine 0.61 - 1.24 mg/dL 8.65   7.84   6.96    Sodium 135 - 145 mmol/L 133   131   138    Potassium 3.5 - 5.1 mmol/L 3.4   3.5   4.0    Chloride 98 - 111 mmol/L 98   95   101    CO2 22 - 32 mmol/L 24   20   30     Calcium 8.9 - 10.3 mg/dL 7.8   8.4   9.7    Total Protein 6.5 - 8.1 g/dL 5.6   6.2   6.8    Total Bilirubin 0.3 - 1.2 mg/dL 1.0   1.6   0.8    Alkaline Phos 38 - 126 U/L 139   151   51    AST 15 - 41 U/L 58   66   18    ALT 0 - 44 U/L 45   55   17       Pertinent Imagings/Other Imagings Plain films and CT images have been personally visualized and interpreted; radiology reports have been reviewed. Decision making incorporated into the Impression / Recommendations.  CT Angio Chest PE W and/or Wo Contrast  Result Date: 11/22/2021 CLINICAL DATA:  Upper respiratory central lines, progressive for 1 week, chest pain anteriorly EXAM: CT ANGIOGRAPHY CHEST WITH CONTRAST  TECHNIQUE: Multidetector CT imaging of the chest was performed using the standard protocol during bolus administration of intravenous contrast. Multiplanar CT image reconstructions and MIPs were obtained to evaluate the vascular anatomy. RADIATION DOSE REDUCTION: This exam was performed according to the departmental dose-optimization program which includes automated exposure control, adjustment of the mA and/or kV according to patient size and/or use of iterative reconstruction technique. CONTRAST:  80mL OMNIPAQUE IOHEXOL 350 MG/ML SOLN COMPARISON:  11/22/2021 chest x-ray  FINDINGS: Cardiovascular: The visualized central and proximal hilar pulmonary arteries are normal in caliber and appear patent. No significant large acute filling defect or pulmonary embolus by CTA. Normal heart size.  No pericardial effusion. Intact thoracic aorta. No aneurysm or dissection. Patent 3 vessel arch anatomy. Central venous structures are patent. No veno-occlusive process within the chest. Mediastinum/Nodes: No enlarged mediastinal, hilar, or axillary lymph nodes. Thyroid gland, trachea, and esophagus demonstrate no significant findings. Lungs/Pleura: Dense bibasilar consolidative opacities with central air bronchograms. Central right middle low patchy bronchovascular airspace opacities as well. Findings compatible with multilobar pneumonia. No pleural abnormality, effusion, or pneumothorax. Upper Abdomen: No acute abnormality. Musculoskeletal: Strandy edema and soft tissue thickening in the left lower neck. Scattered foci of left neck soft tissue air. These inflammatory changes extend inferiorly to the left sternoclavicular joint which has similar surrounding soft tissue thickening, edema, and foci of gas. Air also present in the right sternoclavicular joint but to a lesser degree. Foci of gas are present in the proximal clavicle. Findings compatible with infectious process and concern for involvement of the sternoclavicular joints.  No associated drainable fluid collection or joint effusion. There is also overlying subcutaneous edema and skin thickening. Review of the MIP images confirms the above findings. IMPRESSION: Negative for significant acute pulmonary embolus by CTA. Right middle lobe and bibasilar consolidative opacities compatible with multifocal pneumonia. Left inferior neck and sternoclavicular joint inflammatory changes with scattered foci of air within the soft tissues, joint spaces, and left clavicle, compatible with localized cellulitis/septic arthritis. Again, no drainable fluid collection or large joint effusion. Electronically Signed   By: Judie Petit.  Shick M.D.   On: 11/22/2021 18:09   DG Chest Portable 1 View  Result Date: 11/22/2021 CLINICAL DATA:  Chest pain EXAM: PORTABLE CHEST 1 VIEW COMPARISON:  None. FINDINGS: The heart size and mediastinal contours are within normal limits. Both lungs are clear. The visualized skeletal structures are unremarkable. IMPRESSION: No active disease. Electronically Signed   By: Wiliam Ke M.D.   On: 11/22/2021 17:24    I spent more than 80 minutes for this patient encounter including review of prior medical records/discussing diagnostics and treatment plan with the patient/family/coordinate care with primary/other specialits with greater than 50% of time in face to face encounter.   Electronically signed by:   Odette Fraction, MD Infectious Disease Physician [facility] Pager: [phone]

## 2021-11-23 NOTE — Assessment & Plan Note (Addendum)
Replaced. °

## 2021-11-23 NOTE — TOC Initial Note (Addendum)
Transition of Care (TOC) - Initial/Assessment Note  ? ? ?Patient Details  ?Name: David Strong ?MRN: [id number] ?Date of Birth: May 16, 1956 ? ?Transition of Care [facility]) CM/SW Contact:    ?Lanier Clam, RN ?Phone Number: ?11/23/2021, 9:47 AM ? ?Clinical Narrative:  Monitor for d/c plans. Noted ID to confirm if abx. Pam iv infusion following if long term iv abx needed.  ?-1:55p-[facility] rep Pearson Grippe following for [facility] if needed.              ? ? ?Expected Discharge Plan: Home/Self Care ?Barriers to Discharge: Continued Medical Work up ? ? ?Patient Goals and CMS Choice ?Patient states their goals for this hospitalization and ongoing recovery are:: Home ?CMS Medicare.gov Compare Post Acute Care list provided to:: Patient ?Choice offered to / list presented to : Patient ? ?Expected Discharge Plan and Services ?Expected Discharge Plan: Home/Self Care ?  ?Discharge Planning Services: CM Consult ?Post Acute Care Choice: NA ?Living arrangements for the past 2 months: Single Family Home ?                ?  ?  ?  ?  ?  ?  ?  ?  ?  ?  ? ?Prior Living Arrangements/Services ?Living arrangements for the past 2 months: Single Family Home ?Lives with:: Spouse ?Patient language and need for interpreter reviewed:: Yes ?Do you feel safe going back to the place where you live?: Yes      ?Need for Family Participation in Patient Care: Yes (Comment) ?Care giver support system in place?: Yes (comment) ?  ?Criminal Activity/Legal Involvement Pertinent to Current Situation/Hospitalization: No - Comment as needed ? ?Activities of Daily Living ?Home Assistive Devices/Equipment: Eyeglasses ?ADL Screening (condition at time of admission) ?Patient's cognitive ability adequate to safely complete daily activities?: Yes ?Is the patient deaf or have difficulty hearing?: No ?Does the patient have difficulty seeing, even when wearing glasses/contacts?: No ?Does the patient have difficulty concentrating, remembering, or making decisions?: No ?Patient able to  express need for assistance with ADLs?: Yes ?Does the patient have difficulty dressing or bathing?: No ?Independently performs ADLs?: Yes (appropriate for developmental age) ?Does the patient have difficulty walking or climbing stairs?: No ?Weakness of Legs: Both ?Weakness of Arms/Hands: Both ? ?Permission Sought/Granted ?Permission sought to share information with : Case Manager ?Permission granted to share information with : Yes, Verbal Permission Granted ? Share Information with NAME: Case Manager ?   ?   ?   ? ?Emotional Assessment ?Appearance:: Appears stated age ?Attitude/Demeanor/Rapport: Gracious ?Affect (typically observed): Accepting ?Orientation: : Oriented to Self, Oriented to Place, Oriented to  Time, Oriented to Situation ?Alcohol / Substance Use: Not Applicable ?Psych Involvement: No (comment) ? ?Admission diagnosis:  CAP (community acquired pneumonia) [J18.9] ?Cellulitis, unspecified cellulitis site [L03.90] ?Community acquired pneumonia, unspecified laterality [J18.9] ?Patient Active Problem List  ? Diagnosis Date Noted  ? CAP (community acquired pneumonia) 11/22/2021  ? High anion gap metabolic acidosis 11/22/2021  ? Septic arthritis (HCC) 11/22/2021  ? Transaminitis 11/22/2021  ? Right inguinal hernia 10/12/2021  ? Type 2 diabetes mellitus (HCC) 06/07/2015  ? Glucose intolerance (impaired glucose tolerance) 08/03/2012  ? Leucopenia 08/03/2012  ? Thrombocytopenia (HCC) 08/03/2012  ? ?PCP:  [name], MD ?Pharmacy:   ?[id number] [place], [place] - [address] ?[address] ? [place] [address] ?Phone: [phone] Fax: [phone] ? ?[id number] - [place], [place] - [address] AT [facility] [address] ?  [address] ?[place] [address] ?Phone: [phone] Fax: [phone] ? ? ? ? ?Social Determinants of Health (SDOH) Interventions ?  ? ?Readmission Risk Interventions ?   ? View : No data to display.  ?  ?  ?   ? ? ? ?

## 2021-11-23 NOTE — Progress Notes (Signed)
Initial Nutrition Assessment ? ?DOCUMENTATION CODES:  ? ?Not applicable ? ?INTERVENTION:  ?- will order Ensure Max BID, each supplement provides 150 kcal and 30 grams protein. ? ?- will place Carbohydrate Counting for People with Diabetes handout from the Academy of [employer] Diet[employer] in AVS.  ? ? ?NUTRITION DIAGNOSIS:  ? ?Increased nutrient needs related to acute illness as evidenced by estimated needs. ? ?GOAL:  ? ?Patient will meet greater than or equal to 90% of their needs ? ?MONITOR:  ? ?PO intake, Supplement acceptance, Labs, Weight trends ? ?REASON FOR ASSESSMENT:  ? ?Malnutrition Screening Tool ? ?ASSESSMENT:  ? ?66 year old male with medical history of type 2 DM. He presented to the ED with several day hx of productive cough, subjective fevers, chills, myalgia, severe arthralgia, and new onset erythema and swelling on the L side of the base of the neck. CTA showed no PE, confirmed RML and bibasilar airspace opacities consistent with multifocal pneumonia and L sternoclavicular joint inflammatory changes with scattered foci of air within the soft tissues and sternoclavicular joint space compatible with localized L sternoclavicular septic arthritis/cellulitis, without evidence of drainable fluid collection or large joint effusion. Admitted for severe sepsis d/t streptococcal pneumonia with bacteremia and L sternoclavicular joint septic arthritis. ? ?Patient sleeping at the time of RD visit. Name called x1 and patient did not awake. Did not attempt further in order to allow patient to rest. No visitors present at the time of RD visit. ? ?Able to talk with RN who confirmed patient ate well for breakfast (100%, 575 kcal and 15 grams of protein) and that he had not ordered lunch.  ? ?He has not been seen by a [place] RD at any time in the past. ? ?Weight yesterday was 172 lb and weight on 2/23 was 177 lb. This indicates 5 lb weight loss (2.8% body weight) in the past 1.5 months; not significant for time  frame. Weight on 03/28/21 was 181 lb which indicates 9 lb weight loss (5% body weight) in the past 8 months; not significant for time frame.  ? ? ?Labs reviewed; CBGs: 365 and 368 mg/dl, Na: 133 mmol/l, K: 3.4 mmol/l, BUN: 28 mg/dl, Ca: 7.8 mg/dl, Phos: 2.4 mg/dl, Mg: 2.7 mg/dl. ? ?Medications reviewed; sliding scale novolog, 10 units semglee/day, 1 packet Phos-Nak x3 doses 4/6. ? ?IVF; NS @ 100 ml/hr. ?  ? ?NUTRITION - FOCUSED PHYSICAL EXAM: ? ?Patient sleeping. ? ?Diet Order:   ?Diet Order   ? ?       ?  Diet Carb Modified Fluid consistency: Thin; Room service appropriate? Yes  Diet effective now       ?  ? ?  ?  ? ?  ? ? ?EDUCATION NEEDS:  ? ?No education needs have been identified at this time ? ?Skin:  Skin Assessment: Reviewed RN Assessment ? ?Last BM:  PTA/unknown ? ?Height:  ? ?Ht Readings from Last 1 Encounters:  ?11/22/21 5\' 4"  (1.626 m)  ? ? ?Weight:  ? ?Wt Readings from Last 1 Encounters:  ?11/22/21 78 kg  ? ? ? ?BMI:  Body mass index is 29.52 kg/m?. ? ?Estimated Nutritional Needs:  ?Kcal:  2100-2300 kcal ?Protein:  105-120 grams ?Fluid:  >/= 2.3 L/day ? ? ? ? ?Jarome Matin, MS, RD, LDN ?Registered Dietitian II ?Inpatient Clinical Nutrition ?RD pager # and on-call/weekend pager # available in [place]  ? ?

## 2021-11-23 NOTE — Assessment & Plan Note (Addendum)
Replaced.  Magnesium 2.8. ?

## 2021-11-23 NOTE — Assessment & Plan Note (Addendum)
Due to streptococcal multifocal pneumonia with bacteremia, presumed left sternoclavicular joint septic arthritis and other polyarthralgia/arthritis (right shoulder, left knee) ?Met sepsis criteria on admission including persistent tachypnea, tachycardia and did spike a fever of 100.6 overnight of admission.  CTA chest confirmed multifocal pneumonia.  Elevated lactate. ?Treated with normal saline bolus 1 L, followed by maintenance IV fluids. ?Empirically started on IV vancomycin, ceftriaxone and azithromycin. ?BCID and urine positive for Streptococcus.  Antibiotics then narrowed to IV ceftriaxone and further changed to penicillin G on 4/10.  Will need extended course of oral antibiotics at discharge due to septic arthritis.  ID to recommend choice and duration. ?Infectious disease consultation and follow-up appreciated.  ?TTE: LVEF 60-65%.  No evidence of valvular vegetations.  Surveillance blood cultures 4/7: NTD ?Minimal fluid on ultrasound of left [place] joint, patient declined aspiration and IR also felt that there was not much to aspirate. ?Sepsis physiology has resolved but still with significant arthralgias/arthritis of above joints.  Still with significant pain and swelling of right shoulder area, difficult to raise against gravity, discontinued NSAIDs due to initiation of anticoagulation.  Adjusting pain meds. ?Right shoulder x-ray with chronic and no acute findings.  Now with right upper arm SVT and left lower extremity DVT on anticoagulation. ?As per ID follow-up, ordered left ankle MRI (suggestive of cellulitis and not septic arthritis) and MRI right shoulder (patient could not do it same time last night and is to be done). ?Significant leg and presacral edema, likely related to volume resuscitation early on in admission and hypoalbuminemia.  Oral Lasix 40 mg x 1 dose and monitor.  May need further doses of Lasix. ?

## 2021-11-23 NOTE — Progress Notes (Signed)
?PROGRESS NOTE ?  ?David Strong  PPG:[id number]    DOB: 08-08-1956    DOA: 11/22/2021 ? ?PCP: Horald Pollen, MD  ? ?I have briefly reviewed patients previous medical [facility]. ? ?Chief Complaint  ?Patient presents with  ? Muscle Pain  ? ? ?Hospital Course:  ?66 year old male with medical history significant for type II DM, presented to the ED with a couple days history of progressive productive cough, subjective fevers, chills, myalgia, severe arthralgia and new onset erythema and swelling on the left side of the base of the neck.  CTA showed no PE, confirmed RML and bibasilar airspace opacities consistent with multifocal pneumonia and also left sternoclavicular joint inflammatory changes with scattered foci of air within the soft tissues and sternoclavicular joint space compatible with localized left sternoclavicular septic arthritis/cellulitis, without evidence of drainable fluid collection or large joint effusion.  Subsequent BCID and urine antigen positive for strep.  Antibiotics narrowed to IV ceftriaxone.  Admitted for severe sepsis, POA due to streptococcal pneumonia with bacteremia and left sternoclavicular joint septic arthritis. ? ?Assessment and Plan: ?* Severe sepsis ([place]) ?Due to streptococcal pneumonia with bacteremia and left sternoclavicular joint septic arthritis ?Met sepsis criteria on admission including persistent tachypnea, tachycardia and did spike a fever of 100.6 overnight of admission.  CTA chest confirmed multifocal pneumonia.  Elevated lactate. ?Treated with normal saline bolus 1 L, followed by maintenance IV fluids. ?Empirically started on IV vancomycin, ceftriaxone and azithromycin. ?BCID and urine positive for Streptococcus.  Antibiotics then narrowed to IV ceftriaxone. ?Communicated with infectious disease MD on call to assist with choice and duration of antibiotics and any further evaluation if needed. ?Improving. ? ?Septic arthritis ([place]) ?Left  sternoclavicular joint septic arthritis ?CTA chest report as noted above. ?On admission, case was discussed by EDP with on-call CT surgeon who felt that case and imaging were consistent with septic arthritis of the left sternoclavicular joint and recommended medical management and IV antibiotics without need for any surgical intervention. ? ?CAP (community acquired pneumonia) ?Pneumococcal pneumonia ?Management as noted above. ?As per patient's report, patient's 63-year-old son who goes to daycare had runny nose a couple days prior to patient getting sick. ? ?High anion gap metabolic acidosis ?Lactic acidosis ?Secondary to severe sepsis. ?Presented with lactate of 2.3 which has improved to 2.1.  Bicarbonate normal.  Anion gap has normalized. ?Continue maintenance IV fluids. ? ?Transaminitis ?Mild and likely related to severe sepsis. ?No GI symptoms. ?Acetaminophen level 14, salicylate level less than 7 and acute hepatitis panel negative.  UDS positive only for opiates. ?Follow CMP in AM. ? ?Thrombocytopenia ([place]) ?Suspect chronic or acute on chronic ?Platelet counts in 2018: 104 ?Currently stable in the high 80s.  No bleeding reported.  Monitor. ? ?Type 2 diabetes mellitus ([place]) ?A1c 8 on 10/12/2021. ?On glipizide and dapagliflozin at home.  Currently held. ?CBGs currently uncontrolled in the 300s.  Add low-dose Semglee 10 units daily, continue NovoLog SSI. ?Monitor CBGs closely and adjust insulins as needed. ? ?Hypokalemia ?Replace and follow.  Magnesium 2.7. ? ?Hypophosphatemia ?Replace and follow. ? ? ?Body mass index is 29.52 kg/m?. ? ?DVT prophylaxis: SCDs Start: 11/22/21 1920   ?  Code Status: Full Code:  ?Family Communication: None at bedside ?Disposition:  ?Status is: Inpatient ?Remains inpatient appropriate because: Bacteremia, sepsis, pneumonia requiring IV antibiotics, close monitoring ?  ? ?Consultants:   ?Infectious disease ? ?Procedures:   ?None ? ?Antimicrobials:   ?As noted above ? ? ?Subjective:   ?Seen  this morning.  Feels somewhat better.  Sore throat resolved.  Dry mouth improved and able to drink better.  Cough with brown sputum.  No chest pain or dyspnea.  Still with significant pain in large and small joints, especially left knee and right ankle. ? ?Objective:  ? ?Vitals:  ? 11/22/21 2226 11/23/21 1287 11/23/21 8676 11/23/21 0919  ?BP: 124/88 129/83 123/84 134/85  ?Pulse: (!) 109 100 (!) 101 (!) 107  ?Resp: '20 20 20 20  ' ?Temp: 100.3 ?F (37.9 ?C) (!) 100.6 ?F (38.1 ?C) 98.8 ?F (37.1 ?C) 98.8 ?F (37.1 ?C)  ?TempSrc: Oral Oral  Oral  ?SpO2: 91% 94% 94% 96%  ?Weight:      ?Height:      ? ? ?General exam: Middle-age male, moderately built and nourished lying comfortably propped up in bed without distress.  Oral mucosa with borderline hydration. ?Respiratory system: Occasional basal crackles but otherwise clear to auscultation. ?Cardiovascular system: S1 & S2 heard, RRR. No JVD, murmurs, rubs, gallops or clicks. No pedal edema.  Telemetry personally reviewed: Mild sinus tachycardia in the 110s on admission has improved to 100s. ?Gastrointestinal system: Abdomen is nondistended, soft and nontender. No organomegaly or masses felt. Normal bowel sounds heard. ?Central nervous system: Alert and oriented. No focal neurological deficits. ?Extremities: Symmetric 5 x 5 power. ?Skin: Mild swelling without erythema or tenderness of left sternoclavicular joint.  Left knee and right ankle may be slightly asymmetrically swollen, warm and tender compared to the other counterpart. ?Psychiatry: Judgement and insight appear normal. Mood & affect appropriate.  ? ?Data Reviewed:   ?I have personally reviewed following labs and imaging studies ? ?CBC: ?Recent Labs  ?Lab 11/22/21 ?1608 11/23/21 ?0423  ?WBC 7.4 8.5  ?NEUTROABS 6.0 7.4  ?HGB 14.5 13.7  ?HCT 42.7 39.7  ?MCV 87.0 85.9  ?PLT 88* 89*  ? ?Basic Metabolic Panel: ?Recent Labs  ?Lab 11/22/21 ?1608 11/22/21 ?1808 11/23/21 ?0423  ?NA 131*  --  133*  ?K 3.5  --  3.4*  ?CL 95*   --  98  ?CO2 20*  --  24  ?GLUCOSE 368*  --  377*  ?BUN 24*  --  28*  ?CREATININE 0.88  --  1.14  ?CALCIUM 8.4*  --  7.8*  ?MG  --  2.4 2.7*  ?PHOS  --   --  2.4*  ? ?Liver Function Tests: ?Recent Labs  ?Lab 11/22/21 ?1608 11/23/21 ?0423  ?AST 66* 58*  ?ALT 55* 45*  ?ALKPHOS 151* 139*  ?BILITOT 1.6* 1.0  ?PROT 6.2* 5.6*  ?ALBUMIN 2.9* 2.3*  ? ?CBG: ?Recent Labs  ?Lab 11/22/21 ?2103 11/23/21 ?0730  ?GLUCAP 335* 365*  ? ?Microbiology Studies:  ? ?Recent Results (from the past 240 hour(s))  ?Blood culture (routine x 2)     Status: None (Preliminary result)  ? Collection Time: 11/22/21  4:10 PM  ? Specimen: BLOOD  ?Result Value Ref Range Status  ? Specimen Description   Final  ?  BLOOD BLOOD LEFT FOREARM ?Performed at [facility], [place] [address] [place], [place] [address] ?  ? Special Requests   Final  ?  BOTTLES DRAWN AEROBIC AND ANAEROBIC Blood Culture results may not be optimal due to an inadequate volume of blood received in culture bottles ?Performed at [facility], [place] [address] [place], [place] [address] ?  ? Culture  Setup Time   Final  ?  GRAM POSITIVE COCCI ?IN BOTH AEROBIC AND ANAEROBIC BOTTLES ?Organism ID to follow ?  CRITICAL RESULT CALLED TO, READ BACK BY AND VERIFIED WITH: N. Eagle, AT [phone] 11/23/21 D. VANHOOK ?Performed at [facility], [place] [address], [place], [place] [address] ?  ? Culture GRAM POSITIVE COCCI  Final  ? Report Status PENDING  Incomplete  ?Blood Culture ID Panel (Reflexed)     Status: Abnormal  ? Collection Time: 11/22/21  4:10 PM  ?Result Value Ref Range Status  ? Enterococcus faecalis NOT DETECTED NOT DETECTED Final  ? Enterococcus Faecium NOT DETECTED NOT DETECTED Final  ? Listeria monocytogenes NOT DETECTED NOT DETECTED Final  ? Staphylococcus species NOT DETECTED NOT DETECTED Final  ? Staphylococcus aureus (BCID) NOT DETECTED NOT DETECTED Final  ? Staphylococcus epidermidis NOT DETECTED NOT DETECTED Final  ? Staphylococcus  lugdunensis NOT DETECTED NOT DETECTED Final  ? Streptococcus species DETECTED (A) NOT DETECTED Final  ?  Comment: CRITICAL RESULT CALLED TO, READ BACK BY AND VERIFIED WITH: Guadlupe Spanish PHARMD, AT [phone] 11/23/21 D. VANHOOK ?

## 2021-11-23 NOTE — Progress Notes (Signed)
PHARMACY - PHYSICIAN COMMUNICATION ?CRITICAL VALUE ALERT - BLOOD CULTURE IDENTIFICATION (BCID) ? ?David Strong is an 66 y.o. male who presented to [facility] on 11/22/2021 with a chief complaint of cough, new onset erythema associated with the left aspect of his anterior chest wall.  ? ?Assessment: left sternoclavicular septic arthritis with overlying cellulitis, multifocal PNA per CTA. Strep pneumonaie urinary antigen positive. Blood cultures 4/4 bottles with Strep pneumoniae.  ? ?Name of physician (or Provider) Contacted: Dr. Waymon Amato ? ?Current antibiotics: Vancomycin, Ceftriaxone, Azithromycin ? ?Changes to prescribed antibiotics recommended:  ?Recommendations accepted by provider ?Narrow antibiotics to Ceftriaxone 2g IV q24h  ? ?Results for orders placed or performed during the hospital encounter of 11/22/21  ?Blood Culture ID Panel (Reflexed) (Collected: 11/22/2021  4:10 PM)  ?Result Value Ref Range  ? Enterococcus faecalis NOT DETECTED NOT DETECTED  ? Enterococcus Faecium NOT DETECTED NOT DETECTED  ? Listeria monocytogenes NOT DETECTED NOT DETECTED  ? Staphylococcus species NOT DETECTED NOT DETECTED  ? Staphylococcus aureus (BCID) NOT DETECTED NOT DETECTED  ? Staphylococcus epidermidis NOT DETECTED NOT DETECTED  ? Staphylococcus lugdunensis NOT DETECTED NOT DETECTED  ? Streptococcus species DETECTED (A) NOT DETECTED  ? Streptococcus agalactiae NOT DETECTED NOT DETECTED  ? Streptococcus pneumoniae DETECTED (A) NOT DETECTED  ? Streptococcus pyogenes NOT DETECTED NOT DETECTED  ? A.calcoaceticus-baumannii NOT DETECTED NOT DETECTED  ? Bacteroides fragilis NOT DETECTED NOT DETECTED  ? Enterobacterales NOT DETECTED NOT DETECTED  ? Enterobacter cloacae complex NOT DETECTED NOT DETECTED  ? Escherichia coli NOT DETECTED NOT DETECTED  ? Klebsiella aerogenes NOT DETECTED NOT DETECTED  ? Klebsiella oxytoca NOT DETECTED NOT DETECTED  ? Klebsiella pneumoniae NOT DETECTED NOT DETECTED  ? Proteus species NOT DETECTED NOT  DETECTED  ? Salmonella species NOT DETECTED NOT DETECTED  ? Serratia marcescens NOT DETECTED NOT DETECTED  ? Haemophilus influenzae NOT DETECTED NOT DETECTED  ? Neisseria meningitidis NOT DETECTED NOT DETECTED  ? Pseudomonas aeruginosa NOT DETECTED NOT DETECTED  ? Stenotrophomonas maltophilia NOT DETECTED NOT DETECTED  ? Candida albicans NOT DETECTED NOT DETECTED  ? Candida auris NOT DETECTED NOT DETECTED  ? Candida glabrata NOT DETECTED NOT DETECTED  ? Candida krusei NOT DETECTED NOT DETECTED  ? Candida parapsilosis NOT DETECTED NOT DETECTED  ? Candida tropicalis NOT DETECTED NOT DETECTED  ? Cryptococcus neoformans/gattii NOT DETECTED NOT DETECTED  ? ? ?Greer Pickerel M ?11/23/2021  9:25 AM ? ?

## 2021-11-23 NOTE — Plan of Care (Signed)

## 2021-11-23 NOTE — Hospital Course (Addendum)
66 year old male with medical history significant for type II DM, presented to the ED with a couple days history of progressive productive cough, subjective fevers, chills, myalgia, severe arthralgia and new onset erythema and swelling on the left side of the base of the neck.  CTA showed no PE, confirmed RML and bibasilar airspace opacities consistent with multifocal pneumonia and also left sternoclavicular joint inflammatory changes with scattered foci of air within the soft tissues and sternoclavicular joint space compatible with localized left sternoclavicular septic arthritis/cellulitis, without evidence of drainable fluid collection or large joint effusion.  Subsequent BCID and urine antigen positive for strep.  Antibiotics narrowed to IV ceftriaxone.  Admitted for severe sepsis, POA due to streptococcal pneumonia with bacteremia, left sternoclavicular joint septic arthritis and other large joint arthralgia/arthritis (right shoulder, left knee).  Clinically improving.  Biggest issue is now arthralgia of right shoulder with painful elevation.  Newly diagnosed right upper arm SVT and LLE DVT, briefly on IV heparin, transitioned to Eliquis.  Awaiting MRI right shoulder to rule out septic arthritis.  Significant leg/presacral edema, initiated oral Lasix.  ID continue to follow. ?

## 2021-11-23 NOTE — Discharge Instructions (Addendum)
David Strong, ? ?You were in the hospital with a bad bacterial infection that was in your blood and many joints/different places of your body. You have had surgical management to remove some of the infection, in addition to antibiotics. You have significantly improved. You also were found to have a blood clot and have been started on a blood thinner (Eliquis). Please follow-up with all your physician appointments/recommendations for appointments. You will need continued IV antibiotics. ? ? ?Carbohydrate Counting For People With Diabetes ? ?Foods with carbohydrates make your blood glucose level go up. Learning how to count carbohydrates can help you control your blood glucose levels. First, identify the foods you eat that contain carbohydrates. Then, using the Foods with Carbohydrates chart, determine about how much carbohydrates are in your meals and snacks. Make sure you are eating foods with fiber, protein, and healthy fat along with your carbohydrate foods. ?Foods with Carbohydrates ?The following table shows carbohydrate foods that have about 15 grams of carbohydrate each. Using measuring cups, spoons, or a food scale when you first begin learning about carbohydrate counting can help you learn about the portion sizes you typically eat. ?The following foods have 15 grams carbohydrate each:  ?Grains ?1 slice bread (1 ounce)  ?1 small tortilla (6-inch size)  ?? large bagel (1 ounce)  ?1/3 cup pasta or rice (cooked)  ?? hamburger or hot dog bun (? ounce)  ?? cup cooked cereal  ?? to ? cup ready-to-eat cereal  ?2 taco shells (5-inch size) Fruit ?1 small fresh fruit (? to 1 cup)  ?? medium banana  ?17 small grapes (3 ounces)  ?1 cup melon or berries  ?? cup canned or frozen fruit  ?2 tablespoons dried fruit (blueberries, cherries, cranberries, raisins)  ?? cup unsweetened fruit juice  ?Starchy Vegetables ?? cup cooked beans, peas, corn, potatoes/sweet potatoes  ?? large baked potato (3 ounces)  ?1 cup acorn or  butternut squash  Snack Foods ?3 to 6 crackers  ?8 potato chips or 13 tortilla chips (? ounce to 1 ounce)  ?3 cups popped popcorn  ?Dairy ?3/4 cup (6 ounces) nonfat plain yogurt, or yogurt with sugar-free sweetener  ?1 cup milk  ?1 cup plain rice, soy, coconut or flavored almond milk Sweets and Desserts ?? cup ice cream or frozen yogurt  ?1 tablespoon jam, jelly, pancake syrup, table sugar, or honey  ?2 tablespoons light pancake syrup  ?1 inch square of frosted cake or 2 inch square of unfrosted cake  ?2 small cookies (2/3 ounce each) or ? large cookie  ?Sometimes you?ll have to estimate carbohydrate amounts if you don?t know the exact recipe. One cup of mixed foods like soups can have 1 to 2 carbohydrate servings, while some casseroles might have 2 or more servings of carbohydrate. ?Foods that have less than 20 calories in each serving can be counted as ?free? foods. Count 1 cup raw vegetables, or ? cup cooked non-starchy vegetables as ?free? foods. If you eat 3 or more servings at one meal, then count them as 1 carbohydrate serving.  ?Foods without Carbohydrates  ?Not all foods contain carbohydrates. Meat, some dairy, fats, non-starchy vegetables, and many beverages don?t contain carbohydrate. So when you count carbohydrates, you can generally exclude chicken, pork, beef, fish, seafood, eggs, tofu, cheese, butter, sour cream, avocado, nuts, seeds, olives, mayonnaise, water, black coffee, unsweetened tea, and zero-calorie drinks. Vegetables with no or low carbohydrate include green beans, cauliflower, tomatoes, and onions. ?How much carbohydrate should I eat at each  meal?  ?Carbohydrate counting can help you plan your meals and manage your weight. Following are some starting points for carbohydrate intake at each meal. Work with your registered dietitian nutritionist to find the best range that works for your blood glucose and weight.  ? To Lose Weight To Maintain Weight  ?Women 2 - 3 carb servings 3 - 4 carb  servings  ?Men 3 - 4 carb servings 4 - 5 carb servings  ?Checking your blood glucose after meals will help you know if you need to adjust the timing, type, or number of carbohydrate servings in your meal plan. Achieve and keep a healthy body weight by balancing your food intake and physical activity. ? ?Tips ?How should I plan my meals?  ?Plan for half the food on your plate to include non-starchy vegetables, like salad greens, broccoli, or carrots. Try to eat 3 to 5 servings of non-starchy vegetables every day. Have a protein food at each meal. Protein foods include chicken, fish, meat, eggs, or beans (note that beans contain carbohydrate). These two food groups (non-starchy vegetables and proteins) are low in carbohydrate. If you fill up your plate with these foods, you will eat less carbohydrate but still fill up your stomach. Try to limit your carbohydrate portion to ? of the plate.  ?What fats are healthiest to eat?  ?Diabetes increases risk for heart disease. To help protect your heart, eat more healthy fats, such as olive oil, nuts, and avocado. Eat less saturated fats like butter, cream, and high-fat meats, like bacon and sausage. Avoid trans fats, which are in all foods that list ?partially hydrogenated oil? as an ingredient. ?What should I drink?  ?Choose drinks that are not sweetened with sugar. The healthiest choices are water, carbonated or seltzer waters, and tea and coffee without added sugars.  ?Sweet drinks will make your blood glucose go up very quickly. One serving of soda or energy drink is ? cup. It is best to drink these beverages only if your blood glucose is low.  ?Artificially sweetened, or diet drinks, typically do not increase your blood glucose if they have zero calories in them. Read labels of beverages, as some diet drinks do have carbohydrate and will raise your blood glucose. ?Label Reading Tips ?Read Nutrition Facts labels to find out how many grams of carbohydrate are in a food you  want to eat. Don?t forget: sometimes serving sizes on the label aren?t the same as how much food you are going to eat, so you may need to calculate how much carbohydrate is in the food you are serving yourself.  ? ?Carbohydrate Counting for People with Diabetes Sample 1-Day Menu  ?Breakfast ? cup yogurt, low fat, low sugar (1 carbohydrate serving)  ?? cup cereal, ready-to-eat, unsweetened (1 carbohydrate serving)  ?1 cup strawberries (1 carbohydrate serving)  ?? cup almonds (? carbohydrate serving)  ?Lunch 1, 5 ounce can chunk light tuna  ?2 ounces cheese, low fat cheddar  ?6 whole wheat crackers (1 carbohydrate serving)  ?1 small apple (1? carbohydrate servings)  ?? cup carrots (? carbohydrate serving)  ?? cup snap peas  ?1 cup 1% milk (1 carbohydrate serving)   ?Evening Meal Stir fry made with: 3 ounces chicken  ?1 cup brown rice (3 carbohydrate servings)  ?? cup broccoli (? carbohydrate serving)  ?? cup green beans  ?? cup onions  ?1 tablespoon olive oil  ?2 tablespoons teriyaki sauce (? carbohydrate serving)  ?Evening Snack 1 extra small banana (1 carbohydrate  serving)  ?1 tablespoon peanut butter  ? ?Carbohydrate Counting for People with Diabetes Vegan Sample 1-Day Menu  ?Breakfast 1 cup cooked oatmeal (2 carbohydrate servings)  ?? cup blueberries (1 carbohydrate serving)  ?2 tablespoons flaxseeds  ?1 cup soymilk fortified with calcium and vitamin D  ?1 cup coffee  ?Lunch 2 slices whole wheat bread (2 carbohydrate servings)  ?? cup baked tofu  ?? cup lettuce  ?2 slices tomato  ?2 slices avocado  ?? cup baby carrots (? carbohydrate serving)  ?1 orange (1 carbohydrate serving)  ?1 cup soymilk fortified with calcium and vitamin D   ?Evening Meal Burrito made with: 1 6-inch corn tortilla (1 carbohydrate serving)  ?1 cup refried vegetarian beans (2 carbohydrate servings)  ?? cup chopped tomatoes  ?? cup lettuce  ?? cup salsa  ?1/3 cup brown rice (1 carbohydrate serving)  ?1 tablespoon olive oil for rice  ?? cup  zucchini   ?Evening Snack 6 small whole grain crackers (1 carbohydrate serving)  ?2 apricots (? carbohydrate serving)  ?? cup unsalted peanuts (? carbohydrate serving)   ? ?Carbohydrate Counting for People with Diabe

## 2021-11-24 ENCOUNTER — Inpatient Hospital Stay (HOSPITAL_COMMUNITY): Payer: Medicare Other

## 2021-11-24 DIAGNOSIS — R7881 Bacteremia: Secondary | ICD-10-CM

## 2021-11-24 DIAGNOSIS — R6520 Severe sepsis without septic shock: Secondary | ICD-10-CM | POA: Diagnosis not present

## 2021-11-24 DIAGNOSIS — J158 Pneumonia due to other specified bacteria: Secondary | ICD-10-CM

## 2021-11-24 DIAGNOSIS — A419 Sepsis, unspecified organism: Secondary | ICD-10-CM | POA: Diagnosis not present

## 2021-11-24 DIAGNOSIS — E876 Hypokalemia: Secondary | ICD-10-CM | POA: Diagnosis not present

## 2021-11-24 DIAGNOSIS — M009 Pyogenic arthritis, unspecified: Secondary | ICD-10-CM | POA: Diagnosis not present

## 2021-11-24 LAB — COMPREHENSIVE METABOLIC PANEL
ALT: 56 U/L — ABNORMAL HIGH (ref 0–44)
AST: 105 U/L — ABNORMAL HIGH (ref 15–41)
Albumin: 2.2 g/dL — ABNORMAL LOW (ref 3.5–5.0)
Alkaline Phosphatase: 209 U/L — ABNORMAL HIGH (ref 38–126)
Anion gap: 9 (ref 5–15)
BUN: 31 mg/dL — ABNORMAL HIGH (ref 8–23)
CO2: 25 mmol/L (ref 22–32)
Calcium: 7.5 mg/dL — ABNORMAL LOW (ref 8.9–10.3)
Chloride: 99 mmol/L (ref 98–111)
Creatinine, Ser: 0.92 mg/dL (ref 0.61–1.24)
GFR, Estimated: >60 mL/min (ref >60)
Glucose, Bld: 250 mg/dL — ABNORMAL HIGH (ref 70–99)
Potassium: 3.4 mmol/L — ABNORMAL LOW (ref 3.5–5.1)
Sodium: 133 mmol/L — ABNORMAL LOW (ref 135–145)
Total Bilirubin: 1.5 mg/dL — ABNORMAL HIGH (ref 0.3–1.2)
Total Protein: 5.3 g/dL — ABNORMAL LOW (ref 6.5–8.1)

## 2021-11-24 LAB — CBC
HCT: 39.7 % (ref 39.0–52.0)
Hemoglobin: 13.5 g/dL (ref 13.0–17.0)
MCH: 29.4 pg (ref 26.0–34.0)
MCHC: 34 g/dL (ref 30.0–36.0)
MCV: 86.5 fL (ref 80.0–100.0)
Platelets: 95 10*3/uL — ABNORMAL LOW (ref 150–400)
RBC: 4.59 MIL/uL (ref 4.22–5.81)
RDW: 13.9 % (ref 11.5–15.5)
WBC: 10.3 10*3/uL (ref 4.0–10.5)
nRBC: 0 % (ref 0.0–0.2)

## 2021-11-24 LAB — LEGIONELLA PNEUMOPHILA SEROGP 1 UR AG: L. pneumophila Serogp 1 Ur Ag: NEGATIVE

## 2021-11-24 LAB — MYCOPLASMA PNEUMONIAE ANTIBODY, IGM: Mycoplasma pneumo IgM: <770 U/mL (ref 0–769)

## 2021-11-24 LAB — ECHOCARDIOGRAM COMPLETE
Area-P 1/2: 4.96 cm2
Calc EF: 53.2 %
Height: 64 in
S' Lateral: 2.6 cm
Single Plane A2C EF: 45.4 %
Single Plane A4C EF: 56 %
Weight: 2752 oz

## 2021-11-24 LAB — GLUCOSE, CAPILLARY
Glucose-Capillary: 284 mg/dL — ABNORMAL HIGH (ref 70–99)
Glucose-Capillary: 285 mg/dL — ABNORMAL HIGH (ref 70–99)
Glucose-Capillary: 250 mg/dL — ABNORMAL HIGH (ref 70–99)
Glucose-Capillary: 408 mg/dL — ABNORMAL HIGH (ref 70–99)

## 2021-11-24 LAB — PHOSPHORUS: Phosphorus: 2 mg/dL — ABNORMAL LOW (ref 2.5–4.6)

## 2021-11-24 IMAGING — US US SOFT TISSUE
1 series · 4 of 4 positions shown · non-contrast
Comparison: CT chest from [DATE]

CLINICAL DATA: 65-year-old male with possible septic left
sternoclavicular joint presenting for joint aspiration.

EXAM:
ULTRASOUND LEFT UPPER EXTREMITY LIMITED
TECHNIQUE: Ultrasound examination of the upper extremity soft tissues was
performed in the area of clinical concern.

[Series 1: us biopsy (kidney) mc & wl · 4 of 4 slices shown]
[im 1/4]
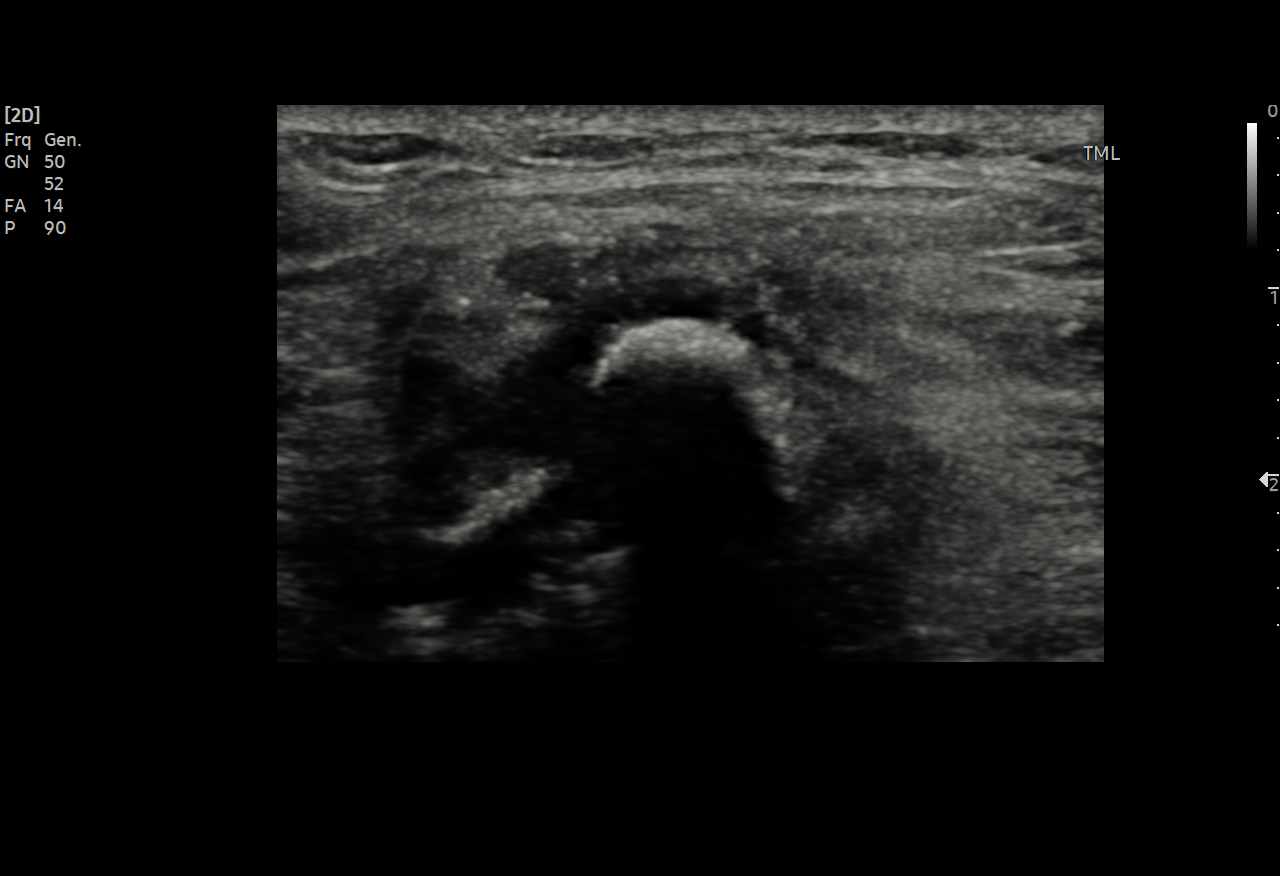
[im 2/4]
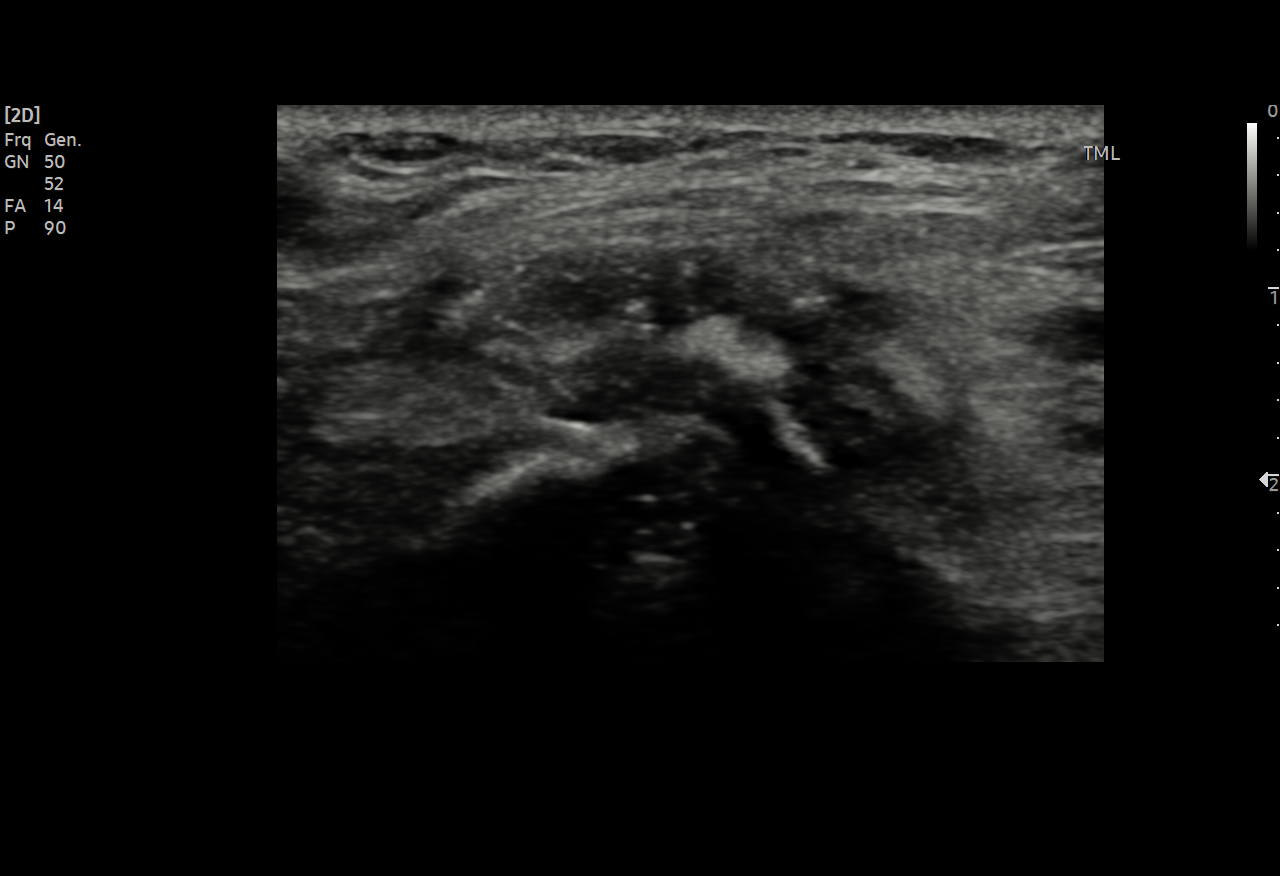
[im 3/4]
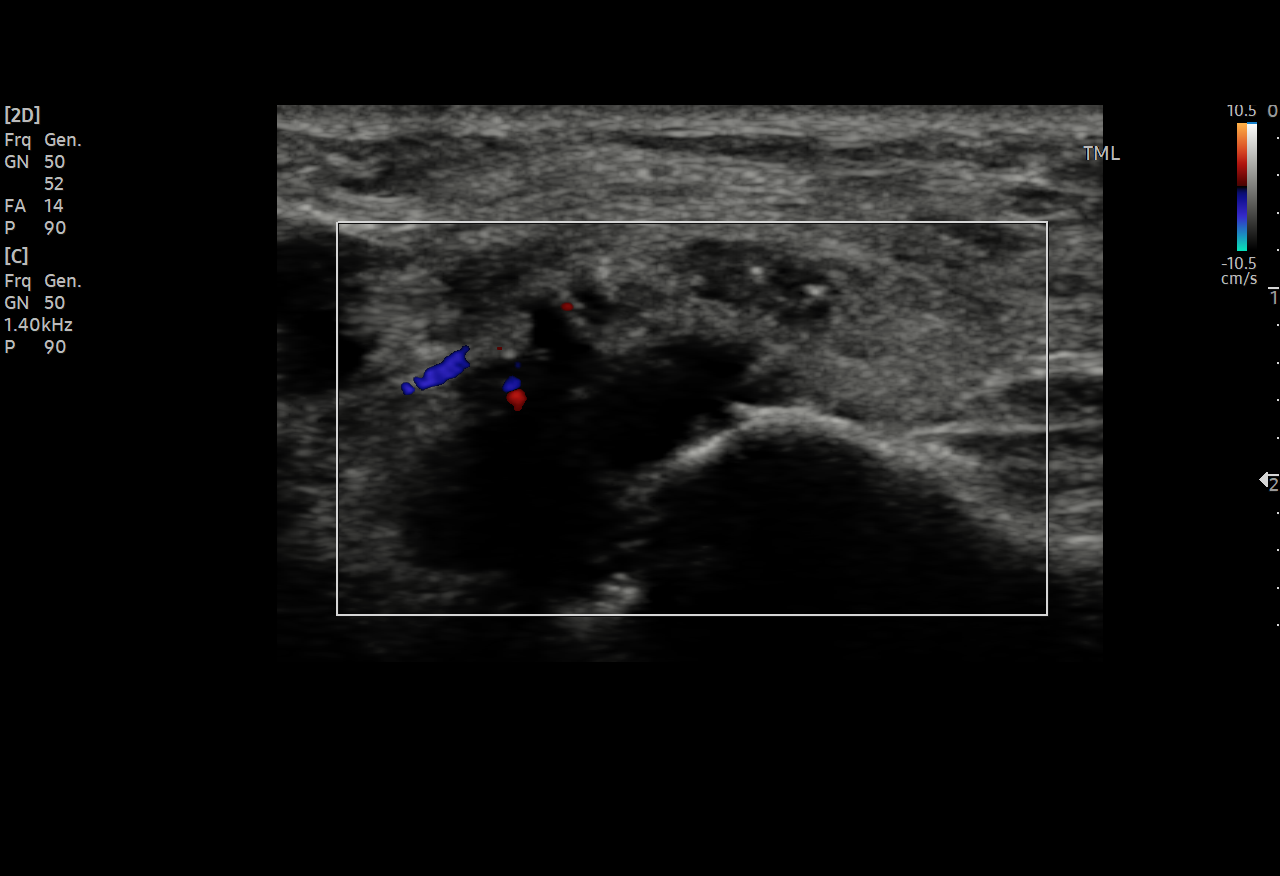
[im 4/4]
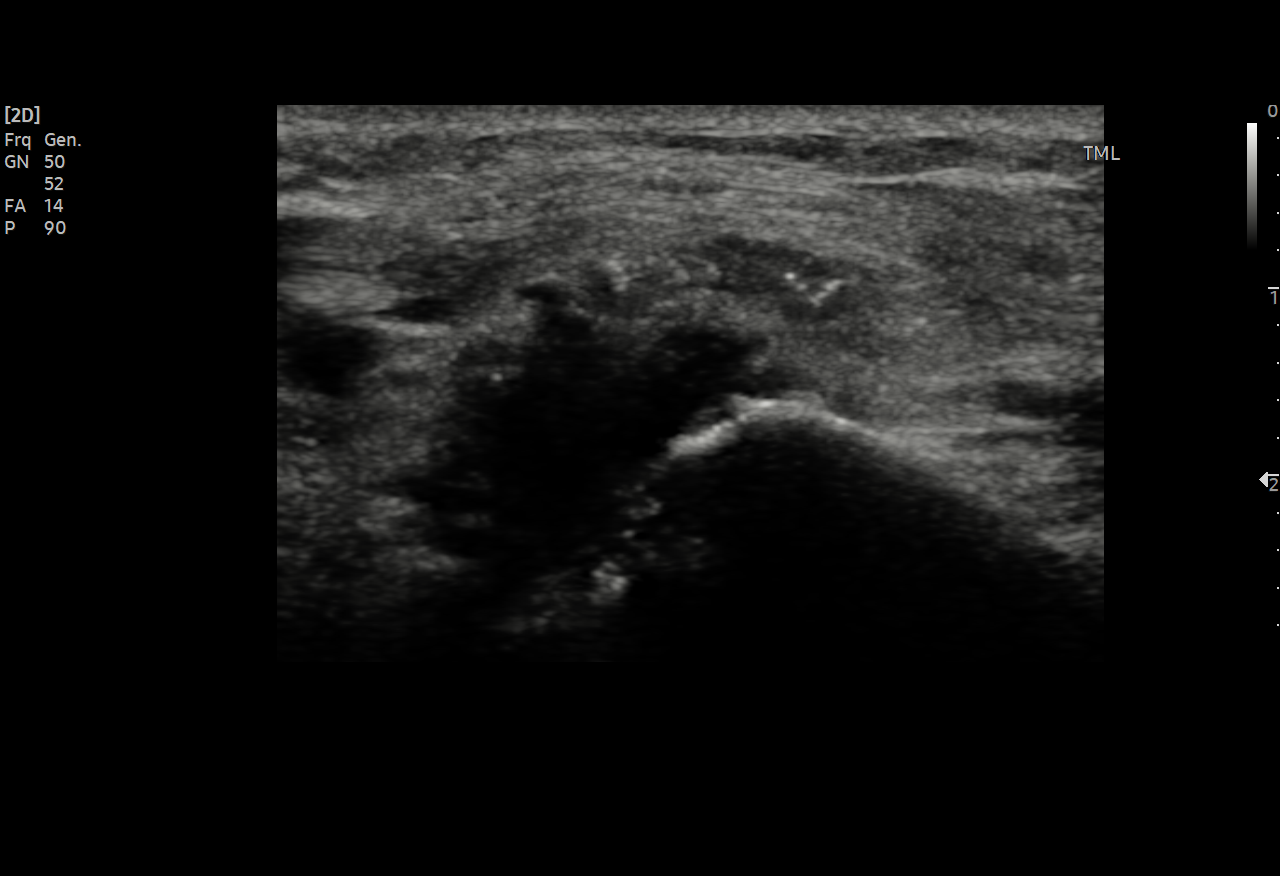

[4 of 4 positions shown; findings below may reference images not displayed]

FINDINGS: Minimal phlegmonous changes and possible trace fluid about the left
sternoclavicular joint.
IMPRESSION: Trace left sternoclavicular fluid, mostly mild surrounding
phlegmonous changes.

The patient refused joint aspiration at this time.

## 2021-11-24 MED ORDER — INSULIN ASPART 100 UNIT/ML IJ SOLN
3.0000 [IU] | Freq: Three times a day (TID) | INTRAMUSCULAR | Status: DC
  Administered 2021-11-24 (×3): 3 [IU] via SUBCUTANEOUS

## 2021-11-24 MED ORDER — POTASSIUM CHLORIDE CRYS ER 20 MEQ PO TBCR
40.0000 meq | EXTENDED_RELEASE_TABLET | Freq: Once | ORAL | Status: AC
Start: 2021-11-24 — End: 2021-11-24
  Administered 2021-11-24: 40 meq via ORAL
  Filled 2021-11-24: qty 2

## 2021-11-24 MED ORDER — POTASSIUM PHOSPHATES 15 MMOLE/5ML IV SOLN
20.0000 mmol | Freq: Once | INTRAVENOUS | Status: AC
  Administered 2021-11-24: 20 mmol via INTRAVENOUS
  Filled 2021-11-24: qty 6.67

## 2021-11-24 MED ORDER — INSULIN GLARGINE-YFGN 100 UNIT/ML ~~LOC~~ SOLN
15.0000 [IU] | Freq: Every day | SUBCUTANEOUS | Status: DC
Start: 2021-11-24 — End: 2021-11-25
  Administered 2021-11-24: 15 [IU] via SUBCUTANEOUS
  Filled 2021-11-24 (×2): qty 0.15

## 2021-11-24 MED ORDER — OXYCODONE HCL 5 MG PO TABS
5.0000 mg | ORAL_TABLET | Freq: Four times a day (QID) | ORAL | Status: DC | PRN
  Administered 2021-11-24 – 2021-11-27 (×6): 5 mg via ORAL
  Filled 2021-11-24 (×6): qty 1

## 2021-11-24 MED ORDER — ACETAMINOPHEN 325 MG PO TABS
650.0000 mg | ORAL_TABLET | Freq: Four times a day (QID) | ORAL | Status: DC | PRN
  Administered 2021-11-25 – 2021-12-11 (×5): 650 mg via ORAL
  Filled 2021-11-24 (×7): qty 2

## 2021-11-24 MED ORDER — IBUPROFEN 200 MG PO TABS
600.0000 mg | ORAL_TABLET | Freq: Three times a day (TID) | ORAL | Status: AC
  Administered 2021-11-24 – 2021-11-25 (×3): 600 mg via ORAL
  Filled 2021-11-24 (×3): qty 3

## 2021-11-24 NOTE — Progress Notes (Signed)
?PROGRESS NOTE ?  ?David Strong  EQA:[id number]    DOB: [birth year]    DOA: 11/22/2021 ? ?PCP: Horald Pollen, MD  ? ?I have briefly reviewed patients previous medical records in [facility]. ? ?Chief Complaint  ?Patient presents with  ? Muscle Pain  ? ? ?Hospital Course:  ?65 year old male with medical history significant for type II DM, presented to the ED with a couple days history of progressive productive cough, subjective fevers, chills, myalgia, severe arthralgia and new onset erythema and swelling on the left side of the base of the neck.  CTA showed no PE, confirmed RML and bibasilar airspace opacities consistent with multifocal pneumonia and also left sternoclavicular joint inflammatory changes with scattered foci of air within the soft tissues and sternoclavicular joint space compatible with localized left sternoclavicular septic arthritis/cellulitis, without evidence of drainable fluid collection or large joint effusion.  Subsequent BCID and urine antigen positive for strep.  Antibiotics narrowed to IV ceftriaxone.  Admitted for severe sepsis, POA due to streptococcal pneumonia with bacteremia, left sternoclavicular joint septic arthritis and other large joint arthralgia/arthritis (right shoulder, left knee).  IR planning ultrasound-guided possible aspiration of left [place] joint. ? ?Assessment and Plan: ?* Severe sepsis ([place]) ?Due to streptococcal multifocal pneumonia with bacteremia, left sternoclavicular joint septic arthritis and other polyarthralgia/arthritis (right shoulder, left knee) ?Met sepsis criteria on admission including persistent tachypnea, tachycardia and did spike a fever of 100.6 overnight of admission.  CTA chest confirmed multifocal pneumonia.  Elevated lactate. ?Treated with normal saline bolus 1 L, followed by maintenance IV fluids. ?Empirically started on IV vancomycin, ceftriaxone and azithromycin. ?BCID and urine positive for Streptococcus.  Antibiotics then narrowed to IV  ceftriaxone. ?Infectious disease consultation appreciated.  Requested TTE, follow-up surveillance blood cultures (drawn 6/7 morning), IR eval for possible aspiration of left [place] joint. ?Sepsis physiology has resolved but still with significant arthralgias/arthritis of above joints.  Added scheduled ibuprofen and as needed Oxy IR for inflammation and pain. ? ?Septic arthritis ([place]) ?Left sternoclavicular joint septic arthritis ?Has other polyarthralgias/arthritis (right shoulder and left knee) ?CTA chest report as noted above. ?On admission, case was discussed by EDP with on-call CT surgeon who felt that case and imaging were consistent with septic arthritis of the left sternoclavicular joint and recommended medical management and IV antibiotics without need for any surgical intervention. ?IR will attempt ultrasound-guided left AC joint aspiration if possible. ? ?CAP (community acquired pneumonia) ?Pneumococcal pneumonia ?Management as noted above. ?As per patient's report, patient's 105-year-old son who goes to daycare had runny nose a couple days prior to patient getting sick. ? ?High anion gap metabolic acidosis ?Lactic acidosis ?Secondary to severe sepsis. ?Presented with lactate of 2.3 which has improved to 2.1.  Bicarbonate normal.  Anion gap has normalized. ?Resolved. ? ?Transaminitis ?Mild and likely related to severe sepsis. ?No GI symptoms. ?Acetaminophen level 14, salicylate level less than 7 and acute hepatitis panel negative.  UDS positive only for opiates. ?AST and ALT are slightly worse.  Continue to trend daily CMP. ? ?Thrombocytopenia ([place]) ?Suspect chronic or acute on chronic ?Platelet counts in 2018: 104 ?Stable.  No bleeding reported. ? ?Type 2 diabetes mellitus ([place]) ?A1c 8 on 10/12/2021. ?On glipizide and dapagliflozin at home.  Currently held. ?CBGs currently uncontrolled in the 300s.  Increased Semglee to 15 units daily continue NovoLog SSI moderate sensitivity and added NovoLog meal time 3 units  3 times daily ?Diabetes coordinator consulted ?Monitor CBGs closely and adjust insulins as needed. ? ?Hypokalemia ?  Replace and follow.  Magnesium 2.7. ? ?Hypophosphatemia ?Replace and follow. ? ? ?Body mass index is 29.52 kg/m?. ? ?DVT prophylaxis: SCDs Start: 11/22/21 1920   ?  Code Status: Full Code:  ?Family Communication: Spouse at bedside. ?Disposition:  ?Status is: Inpatient ?Remains inpatient appropriate because: Bacteremia, sepsis, pneumonia requiring IV antibiotics, close monitoring ?  ? ?Consultants:   ?Infectious disease ?IR ? ?Procedures:   ?None ? ?Antimicrobials:   ?IV ceftriaxone ? ? ?Subjective:  ?No dyspnea.  Cough with productive brown sputum.  Ongoing significant probably arthralgia with inability to move or even turn adequately in bed.  Mostly pain in right shoulder and left knee.  Pain in left [place] joint.  States that he was using several OTC meds PTA including Tylenol, ibuprofen, NyQuil etc. without much relief. ? ?Objective:  ? ?Vitals:  ? 11/23/21 1152 11/23/21 2105 11/24/21 0134 11/24/21 0616  ?BP: 123/80 123/79 118/77 126/79  ?Pulse: (!) 110 (!) 109 95 97  ?Resp: '20 20 20 18  ' ?Temp: 99 ?F (37.2 ?C) (!) 100.9 ?F (38.3 ?C) 100 ?F (37.8 ?C) 98.8 ?F (37.1 ?C)  ?TempSrc: Oral Oral Oral   ?SpO2: 99% 95% 96% 97%  ?Weight:      ?Height:      ? ? ?General exam: Middle-age male, moderately built and nourished lying comfortably propped up in bed without distress.  Oral mucosa moist ?Respiratory system: Occasional basal crackles but otherwise clear to auscultation.  Stable without change. ?Cardiovascular system: S1 and S2 heard, RRR.  No JVD, murmurs or pedal edema. ?Gastrointestinal system: Abdomen is nondistended, soft and nontender. No organomegaly or masses felt. Normal bowel sounds heard. ?Central nervous system: Alert and oriented. No focal neurological deficits. ?Extremities: Symmetric 5 x 5 power. ?Skin: Minimal swelling and tenderness of left [place] joint without erythema.  Tenderness of right  shoulder joint without swelling.  Mild swelling, warmth and tenderness of left knee joint. ?Psychiatry: Judgement and insight appear normal. Mood & affect appropriate.  ? ?Data Reviewed:   ?I have personally reviewed following labs and imaging studies ? ?CBC: ?Recent Labs  ?Lab 11/22/21 ?1608 11/23/21 ?0423 11/24/21 ?0502  ?WBC 7.4 8.5 10.3  ?NEUTROABS 6.0 7.4  --   ?HGB 14.5 13.7 13.5  ?HCT 42.7 39.7 39.7  ?MCV 87.0 85.9 86.5  ?PLT 88* 89* 95*  ? ?Basic Metabolic Panel: ?Recent Labs  ?Lab 11/22/21 ?1608 11/22/21 ?1808 11/23/21 ?0423 11/24/21 ?0502  ?NA 131*  --  133* 133*  ?K 3.5  --  3.4* 3.4*  ?CL 95*  --  98 99  ?CO2 20*  --  24 25  ?GLUCOSE 368*  --  377* 250*  ?BUN 24*  --  28* 31*  ?CREATININE 0.88  --  1.14 0.92  ?CALCIUM 8.4*  --  7.8* 7.5*  ?MG  --  2.4 2.7*  --   ?PHOS  --   --  2.4* 2.0*  ? ?Liver Function Tests: ?Recent Labs  ?Lab 11/22/21 ?1608 11/23/21 ?0423 11/24/21 ?0502  ?AST 66* 58* 105*  ?ALT 55* 45* 56*  ?ALKPHOS 151* 139* 209*  ?BILITOT 1.6* 1.0 1.5*  ?PROT 6.2* 5.6* 5.3*  ?ALBUMIN 2.9* 2.3* 2.2*  ? ?CBG: ?Recent Labs  ?Lab 11/23/21 ?2101 11/24/21 ?0736 11/24/21 ?1139  ?GLUCAP 319* 284* 285*  ? ?Microbiology Studies:  ? ?Recent Results (from the past 240 hour(s))  ?Blood culture (routine x 2)     Status: Abnormal (Preliminary result)  ? Collection Time: 11/22/21  4:10 PM  ? Specimen:  BLOOD  ?Result Value Ref Range Status  ? Specimen Description   Final  ?  BLOOD BLOOD LEFT FOREARM ?Performed at [facility], [place] [address], [place], [place] [address] ?  ? Special Requests   Final  ?  BOTTLES DRAWN AEROBIC AND ANAEROBIC Blood Culture results may not be optimal due to an inadequate volume of blood received in culture bottles ?Performed at [facility], [place] [address], [place], [place] [address] ?  ? Culture  Setup Time   Final  ?  GRAM POSITIVE COCCI ?IN BOTH AEROBIC AND ANAEROBIC BOTTLES ?Organism ID to follow ?CRITICAL RESULT CALLED TO, READ BACK BY AND  VERIFIED WITH: N. [place], AT [phone] 11/23/21 D. VANHOOK ?  ? Culture (A)  Final  ?  STREPTOCOCCUS PNEUMONIAE ?SUSCEPTIBILITIES TO FOLLOW ?Performed at [facility], [place],

## 2021-11-24 NOTE — Progress Notes (Signed)
?  Echocardiogram ?2D Echocardiogram has been performed. ? ?David Strong ?11/24/2021, 1:21 PM ?

## 2021-11-24 NOTE — Progress Notes (Signed)
Inpatient Diabetes Program Recommendations ? ?AACE/ADA: New Consensus Statement on Inpatient Glycemic Control (2015) ? ?Target Ranges:  Prepandial:   less than 140 mg/dL ?     Peak postprandial:   less than 180 mg/dL (1-2 hours) ?     Critically ill patients:  140 - 180 mg/dL  ? ?Lab Results  ?Component Value Date  ? GLUCAP 285 (H) 11/24/2021  ? HGBA1C 8.6 (H) 11/23/2021  ? ? ?Review of Glycemic Control ? ?Diabetes history: DM2 ?Outpatient Diabetes medications: glipizide 10 mg QAM ?Current orders for Inpatient glycemic control: Semglee 15 QD, Novolog 0-15 units TID with meals and 0-5 HS + 3 units TID ? ?HgbA1C - 8.6% ?CBGs 284, 285 mg/dL ? ? ?Inpatient Diabetes Program Recommendations:   ? ?Spoke with pt at bedside regarding his diabetes control and HgbA1C of 8.6%. Pt states he is followed by PCP and his HgbA1C was over 11%. He's gotten it down with improved diet and is now taking glipizide 10 QAM. Stopped taking Marcelline Deist because he didn't like the way he felt on it. His PCP said he could come off of it. Pt does not want to go home on insulin. We discussed getting HgbA1C down to around 8%. He follows up with his PCP every 3 months. States his native diet is very high in fiber unlike [employer].  ?Appreciative of visit. ? ?Thank you. ?Ailene Ards, RD, LDN, CDE ?Inpatient Diabetes Coordinator ?[phone]  ? ? ? ? ? ? ? ? ?

## 2021-11-24 NOTE — Progress Notes (Signed)
?  [facility] ? ? ?Reason for visit: Follow up on Pneumococcal bacteremia with  joint septic arthritis, polyarthralgias, multifocal pneumonia ? ?Interval History: some improvement in his right shoulder movement; left shoulder better; both ankles with stiffness; knees ok. Repeat blood cultures sent. Tmax 100.9 ?Day 3 total antibiotics ? ?Physical Exam: ?Constitutional:  ?Vitals:  ? 11/24/21 0616 11/24/21 1336  ?BP: 126/79 113/83  ?Pulse: 97 81  ?Resp: 18 16  ?Temp: 98.8 ?F (37.1 ?C) 97.8 ?F (36.6 ?C)  ?SpO2: 97% 96%  ? patient appears in NAD ?Respiratory: Normal respiratory effort; CTA B ?Cardiovascular: RRR ?MS: right shoulder warm, decreased range of motion; left shoulder with normal range of motion ? ?Review of Systems: ?Gastrointestinal: negative for nausea and diarrhea ? ?Lab Results  ?Component Value Date  ? WBC 10.3 11/24/2021  ? HGB 13.5 11/24/2021  ? HCT 39.7 11/24/2021  ? MCV 86.5 11/24/2021  ? PLT 95 (L) 11/24/2021  ?  ?Lab Results  ?Component Value Date  ? CREATININE 0.92 11/24/2021  ? BUN 31 (H) 11/24/2021  ? NA 133 (L) 11/24/2021  ? K 3.4 (L) 11/24/2021  ? CL 99 11/24/2021  ? CO2 25 11/24/2021  ?  ?Lab Results  ?Component Value Date  ? ALT 56 (H) 11/24/2021  ? AST 105 (H) 11/24/2021  ? GGT 141 (H) 11/23/2021  ? ALKPHOS 209 (H) 11/24/2021  ?  ? ?Microbiology: ?Recent Results (from the past 240 hour(s))  ?Blood culture (routine x 2)     Status: Abnormal (Preliminary result)  ? Collection Time: 11/22/21  4:10 PM  ? Specimen: BLOOD  ?Result Value Ref Range Status  ? Specimen Description   Final  ?  BLOOD BLOOD LEFT FOREARM ?Performed at [facility], [address], [place], [place] [address] ?  ? Special Requests   Final  ?  BOTTLES DRAWN AEROBIC AND ANAEROBIC Blood Culture results may not be optimal due to an inadequate volume of blood received in culture bottles ?Performed at [facility], [address] [place], [place] [address] ?  ?  Culture  Setup Time   Final  ?  GRAM POSITIVE COCCI ?IN BOTH AEROBIC AND ANAEROBIC BOTTLES ?Organism ID to follow ?CRITICAL RESULT CALLED TO, READ BACK BY AND VERIFIED WITHHaze Boyden PHARMD, AT [phone] 11/23/21 D. VANHOOK ?  ? Culture (A)  Final  ?  STREPTOCOCCUS PNEUMONIAE ?SUSCEPTIBILITIES TO FOLLOW ?Performed at [facility], [address] [place], [place] [address] ?  ? Report Status PENDING  Incomplete  ?Blood Culture ID Panel (Reflexed)     Status: Abnormal  ? Collection Time: 11/22/21  4:10 PM  ?Result Value Ref Range Status  ? Enterococcus faecalis NOT DETECTED NOT DETECTED Final  ? Enterococcus Faecium NOT DETECTED NOT DETECTED Final  ? Listeria monocytogenes NOT DETECTED NOT DETECTED Final  ? Staphylococcus species NOT DETECTED NOT DETECTED Final  ? Staphylococcus aureus (BCID) NOT DETECTED NOT DETECTED Final  ? Staphylococcus epidermidis NOT DETECTED NOT DETECTED Final  ? Staphylococcus lugdunensis NOT DETECTED NOT DETECTED Final  ? Streptococcus species DETECTED (A) NOT DETECTED Final  ?  Comment: CRITICAL RESULT CALLED TO, READ BACK BY AND VERIFIED WITH: Haze Boyden PHARMD, AT [phone] 11/23/21 D. VANHOOK ?  ? Streptococcus agalactiae NOT DETECTED NOT DETECTED Final  ? Streptococcus pneumoniae DETECTED (A) NOT DETECTED Final  ?  Comment: CRITICAL RESULT CALLED TO, READ BACK BY AND VERIFIED WITH: Haze Boyden PHARMD, AT [phone] 11/23/21 D. VANHOOK ?  ?  Streptococcus pyogenes NOT DETECTED NOT DETECTED Final  ? A.calcoaceticus-baumannii NOT DETECTED NOT DETECTED Final  ? Bacteroides fragilis NOT DETECTED NOT DETECTED Final  ? Enterobacterales NOT DETECTED NOT DETECTED Final  ? Enterobacter cloacae complex NOT DETECTED NOT DETECTED Final  ? Escherichia coli NOT DETECTED NOT DETECTED Final  ? Klebsiella aerogenes NOT DETECTED NOT DETECTED Final  ? Klebsiella oxytoca NOT DETECTED NOT DETECTED Final  ? Klebsiella pneumoniae NOT DETECTED NOT DETECTED Final  ? Proteus species NOT DETECTED NOT DETECTED Final  ?  Salmonella species NOT DETECTED NOT DETECTED Final  ? Serratia marcescens NOT DETECTED NOT DETECTED Final  ? Haemophilus influenzae NOT DETECTED NOT DETECTED Final  ? Neisseria meningitidis NOT DETECTED NOT DETECTED Final  ? Pseudomonas aeruginosa NOT DETECTED NOT DETECTED Final  ? Stenotrophomonas maltophilia NOT DETECTED NOT DETECTED Final  ? Candida albicans NOT DETECTED NOT DETECTED Final  ? Candida auris NOT DETECTED NOT DETECTED Final  ? Candida glabrata NOT DETECTED NOT DETECTED Final  ? Candida krusei NOT DETECTED NOT DETECTED Final  ? Candida parapsilosis NOT DETECTED NOT DETECTED Final  ? Candida tropicalis NOT DETECTED NOT DETECTED Final  ? Cryptococcus neoformans/gattii NOT DETECTED NOT DETECTED Final  ?  Comment: Performed at [facility], [address] [place], [place] [address]  ?Blood culture (routine x 2)     Status: Abnormal (Preliminary result)  ? Collection Time: 11/22/21  4:15 PM  ? Specimen: BLOOD  ?Result Value Ref Range Status  ? Specimen Description   Final  ?  BLOOD BLOOD LEFT FOREARM ?Performed at [facility], [address] [place], [place] [address] ?  ? Special Requests   Final  ?  BOTTLES DRAWN AEROBIC AND ANAEROBIC Blood Culture results may not be optimal due to an inadequate volume of blood received in culture bottles ?Performed at [facility], [address] [place], [place] [address] ?  ? Culture  Setup Time   Final  ?  GRAM POSITIVE COCCI IN CHAINS ?IN BOTH AEROBIC AND ANAEROBIC BOTTLES ?CRITICAL VALUE NOTED.  VALUE IS CONSISTENT WITH PREVIOUSLY REPORTED AND CALLED VALUE. ?Performed at [facility], [address] [place], [place] [address] ?  ? Culture STREPTOCOCCUS PNEUMONIAE (A)  Final  ? Report Status PENDING  Incomplete  ?Resp Panel by RT-PCR (Flu A&B, Covid)     Status: None  ? Collection Time: 11/22/21  4:25 PM  ? Specimen: Nasopharyngeal(NP) swabs in vial transport medium  ?Result Value Ref Range Status  ? SARS  Coronavirus 2 by RT PCR NEGATIVE NEGATIVE Final  ?  Comment: (NOTE) ?SARS-CoV-2 target nucleic acids are NOT DETECTED. ? ?The SARS-CoV-2 RNA is generally detectable in upper respiratory ?specimens during the acute phase of infection. The lowest ?concentration of SARS-CoV-2 viral copies this assay can detect is ?138 copies/mL. A negative result does not preclude SARS-Cov-2 ?infection and should not be used as the sole basis for treatment or ?other patient management decisions. A negative result may occur with  ?improper specimen collection/handling, submission of specimen other ?than nasopharyngeal swab, presence of viral mutation(s) within the ?areas targeted by this assay, and inadequate number of viral ?copies(<138 copies/mL). A negative result must be combined with ?clinical observations, patient history, and epidemiological ?information. The expected result is Negative. ? ?Fact Sheet for Patients:  ?BloggerCourse.comhttps://www.fda.gov/media/152166/download ? ?Fact Sheet for Healthcare Providers:  ?SeriousBroker.ithttps://www.fda.gov/media/152162/download ? ?This test is no t yet approved or cleared by the [place] FDA and  ?has been authorized for  detection and/or diagnosis of SARS-CoV-2 by ?FDA under an Emergency Use Authorization (EUA). This EUA will remain  ?in effect (meaning this test can be used) for the duration of the ?COVID-19 declaration under Section 564(b)(1) of the Act, 21 ?U.S.C.section 360bbb-3(b)(1), unless the authorization is terminated  ?or revoked sooner.  ? ? ?  ? Influenza A by PCR NEGATIVE NEGATIVE Final  ? Influenza B by PCR NEGATIVE NEGATIVE Final  ?  Comment: (NOTE) ?The Xpert Xpress SARS-CoV-2/FLU/RSV plus assay is intended as an aid ?in the diagnosis of influenza from Nasopharyngeal swab specimens and ?should not be used as a sole basis for treatment. Nasal washings and ?aspirates are unacceptable for Xpert Xpress SARS-CoV-2/FLU/RSV ?testing. ? ?Fact Sheet for  Patients: ?BloggerCourse.com ? ?Fact Sheet for Healthcare Providers: ?SeriousBroker.it ? ?This test is not yet approved or cleared by the [place] FDA and ?has been authorized for detection and/or diagnosis

## 2021-11-24 NOTE — Progress Notes (Signed)
Patient ID: David Strong, male   DOB: January 26, 1956, 66 y.o.   MRN: [id number] ?Pt scheduled for US imaging of left [place] joint region today with possible aspiration if indicated (local anesthetic only). Details/risks of procedure d/w pt with his understanding and consent.  ?

## 2021-11-25 DIAGNOSIS — M009 Pyogenic arthritis, unspecified: Secondary | ICD-10-CM | POA: Diagnosis not present

## 2021-11-25 DIAGNOSIS — R7881 Bacteremia: Secondary | ICD-10-CM | POA: Diagnosis not present

## 2021-11-25 DIAGNOSIS — E876 Hypokalemia: Secondary | ICD-10-CM | POA: Diagnosis not present

## 2021-11-25 LAB — CULTURE, BLOOD (ROUTINE X 2)

## 2021-11-25 LAB — COMPREHENSIVE METABOLIC PANEL
ALT: 66 U/L — ABNORMAL HIGH (ref 0–44)
AST: 116 U/L — ABNORMAL HIGH (ref 15–41)
Albumin: 2.1 g/dL — ABNORMAL LOW (ref 3.5–5.0)
Alkaline Phosphatase: 265 U/L — ABNORMAL HIGH (ref 38–126)
Anion gap: 6 (ref 5–15)
BUN: 26 mg/dL — ABNORMAL HIGH (ref 8–23)
CO2: 26 mmol/L (ref 22–32)
Calcium: 7.6 mg/dL — ABNORMAL LOW (ref 8.9–10.3)
Chloride: 100 mmol/L (ref 98–111)
Creatinine, Ser: 0.65 mg/dL (ref 0.61–1.24)
GFR, Estimated: >60 mL/min (ref >60)
Glucose, Bld: 242 mg/dL — ABNORMAL HIGH (ref 70–99)
Potassium: 3.4 mmol/L — ABNORMAL LOW (ref 3.5–5.1)
Sodium: 132 mmol/L — ABNORMAL LOW (ref 135–145)
Total Bilirubin: 1.3 mg/dL — ABNORMAL HIGH (ref 0.3–1.2)
Total Protein: 5.5 g/dL — ABNORMAL LOW (ref 6.5–8.1)

## 2021-11-25 LAB — GLUCOSE, CAPILLARY
Glucose-Capillary: 248 mg/dL — ABNORMAL HIGH (ref 70–99)
Glucose-Capillary: 306 mg/dL — ABNORMAL HIGH (ref 70–99)
Glucose-Capillary: 274 mg/dL — ABNORMAL HIGH (ref 70–99)
Glucose-Capillary: 148 mg/dL — ABNORMAL HIGH (ref 70–99)

## 2021-11-25 LAB — CBC
HCT: 40.1 % (ref 39.0–52.0)
Hemoglobin: 13.9 g/dL (ref 13.0–17.0)
MCH: 29.7 pg (ref 26.0–34.0)
MCHC: 34.7 g/dL (ref 30.0–36.0)
MCV: 85.7 fL (ref 80.0–100.0)
Platelets: 100 10*3/uL — ABNORMAL LOW (ref 150–400)
RBC: 4.68 MIL/uL (ref 4.22–5.81)
RDW: 14.3 % (ref 11.5–15.5)
WBC: 7.6 10*3/uL (ref 4.0–10.5)
nRBC: 0 % (ref 0.0–0.2)

## 2021-11-25 LAB — MAGNESIUM: Magnesium: 2.8 mg/dL — ABNORMAL HIGH (ref 1.7–2.4)

## 2021-11-25 MED ORDER — POTASSIUM CHLORIDE CRYS ER 20 MEQ PO TBCR
30.0000 meq | EXTENDED_RELEASE_TABLET | ORAL | Status: AC
  Administered 2021-11-25 (×2): 30 meq via ORAL
  Filled 2021-11-25 (×2): qty 1

## 2021-11-25 MED ORDER — INSULIN ASPART 100 UNIT/ML IJ SOLN
5.0000 [IU] | Freq: Three times a day (TID) | INTRAMUSCULAR | Status: DC
  Administered 2021-11-25 – 2021-11-29 (×10): 5 [IU] via SUBCUTANEOUS

## 2021-11-25 MED ORDER — HYDROCOD POLI-CHLORPHE POLI ER 10-8 MG/5ML PO SUER
5.0000 mL | Freq: Two times a day (BID) | ORAL | Status: DC | PRN
  Administered 2021-11-25: 5 mL via ORAL
  Filled 2021-11-25: qty 5

## 2021-11-25 MED ORDER — IBUPROFEN 200 MG PO TABS
600.0000 mg | ORAL_TABLET | Freq: Three times a day (TID) | ORAL | Status: DC
  Administered 2021-11-25 – 2021-11-26 (×5): 600 mg via ORAL
  Filled 2021-11-25 (×5): qty 3

## 2021-11-25 MED ORDER — INSULIN GLARGINE-YFGN 100 UNIT/ML ~~LOC~~ SOLN
18.0000 [IU] | Freq: Every day | SUBCUTANEOUS | Status: DC
  Administered 2021-11-25 – 2021-11-29 (×5): 18 [IU] via SUBCUTANEOUS
  Filled 2021-11-25 (×5): qty 0.18

## 2021-11-25 NOTE — Plan of Care (Signed)

## 2021-11-25 NOTE — Progress Notes (Signed)
?PROGRESS NOTE ?  ?David Strong  MRN:[id number]    DOB: 07/15/1956    DOA: 11/22/2021 ? ?PCP: Sagardia, [name], MD  ? ?I have briefly reviewed patients previous medical records in [facility]. ? ?Chief Complaint  ?Patient presents with  ? Muscle Pain  ? ? ?Hospital Course:  ?66-year-old male with medical history significant for type II DM, presented to the ED with a couple days history of progressive productive cough, subjective fevers, chills, myalgia, severe arthralgia and new onset erythema and swelling on the left side of the base of the neck.  CTA showed no PE, confirmed RML and bibasilar airspace opacities consistent with multifocal pneumonia and also left sternoclavicular joint inflammatory changes with scattered foci of air within the soft tissues and sternoclavicular joint space compatible with localized left sternoclavicular septic arthritis/cellulitis, without evidence of drainable fluid collection or large joint effusion.  Subsequent BCID and urine antigen positive for strep.  Antibiotics narrowed to IV ceftriaxone.  Admitted for severe sepsis, POA due to streptococcal pneumonia with bacteremia, left sternoclavicular joint septic arthritis and other large joint arthralgia/arthritis (right shoulder, left knee).  IR planning ultrasound-guided possible aspiration of left [place] joint. ? ?Assessment and Plan: ?* Severe sepsis (HCC) ?Due to streptococcal multifocal pneumonia with bacteremia, left sternoclavicular joint septic arthritis and other polyarthralgia/arthritis (right shoulder, left knee) ?Met sepsis criteria on admission including persistent tachypnea, tachycardia and did spike a fever of 100.6 overnight of admission.  CTA chest confirmed multifocal pneumonia.  Elevated lactate. ?Treated with normal saline bolus 1 L, followed by maintenance IV fluids. ?Empirically started on IV vancomycin, ceftriaxone and azithromycin. ?BCID and urine positive for Streptococcus.  Antibiotics then narrowed to IV  ceftriaxone. ?Infectious disease consultation appreciated.  ?TTE: LVEF 60-65%.  No evidence of valvular vegetations.  Surveillance blood cultures 4/7: NTD ?Minimal fluid on ultrasound of left [place] joint, patient declined aspiration and IR also felt that there was not much to aspirate. ?Sepsis physiology has resolved but still with significant arthralgias/arthritis of above joints which is now improving with NSAIDs, continue for a day or 2. ?Discussed with ID, could possibly DC on approximately 2 weeks of oral antibiotics in a day or 2 pending further improvement in his arthralgia and mobility. ? ?Septic arthritis (HCC) ?Left sternoclavicular joint septic arthritis ?Has other polyarthralgias/arthritis (right shoulder and left knee) ?CTA chest report as noted above. ?On admission, case was discussed by EDP with on-call CT surgeon who felt that case and imaging were consistent with septic arthritis of the left sternoclavicular joint and recommended medical management and IV antibiotics without need for any surgical intervention. ?IR will attempt ultrasound-guided left AC joint aspiration if possible. ? ?CAP (community acquired pneumonia) ?Pneumococcal pneumonia ?Management as noted above. ?As per patient's report, patient's 2-year-old son who goes to daycare had runny nose a couple days prior to patient getting sick. ? ?High anion gap metabolic acidosis ?Lactic acidosis ?Secondary to severe sepsis. ?Presented with lactate of 2.3 which has improved to 2.1.  Bicarbonate normal.  Anion gap has normalized. ?Resolved. ? ?Transaminitis ?Mild and likely related to severe sepsis. ?No GI symptoms. ?Acetaminophen level 14, salicylate level less than 7 and acute hepatitis panel negative.  UDS positive only for opiates. ?AST and ALT essentially stable.  Continue to trend daily CMP. ? ?Thrombocytopenia (HCC) ?Suspect chronic or acute on chronic ?Platelet counts in 2018: 104 ?Slowly improving. ? ?Type 2 diabetes mellitus (HCC) ?A1c 8  on 10/12/2021. ?On glipizide and dapagliflozin at home.  Currently held. ?CBGs   uncontrolled and fluctuating.  Increased Semglee to 18 units daily continue NovoLog SSI moderate sensitivity and increased NovoLog meal time 5 units 3 times daily ?Diabetes coordinator consulted ?Monitor CBGs closely and adjust insulins as needed. ? ?Hypokalemia ?Replace aggressively and follow.  Magnesium 2.8. ? ?Hypophosphatemia ?Replace and follow. ? ? ?Body mass index is 25.47 kg/m?. ? ?DVT prophylaxis: SCDs Start: 11/22/21 1920   ?  Code Status: Full Code:  ?Family Communication: None at bedside ?Status is: Inpatient ?Remains inpatient appropriate because: Bacteremia, sepsis, pneumonia requiring IV antibiotics, close monitoring ?  ? ?Consultants:   ?Infectious disease ?IR ? ?Procedures:   ?None ? ?Antimicrobials:   ?IV ceftriaxone ? ? ?Subjective:  ?Feels better.  Joint pains have improved.  Reports that he was up in chair yesterday. ? ?Objective:  ? ?Vitals:  ? 11/24/21 2111 11/25/21 0500 11/25/21 0630 11/25/21 1253  ?BP: 136/89  117/79 114/77  ?Pulse: 86  83 86  ?Resp: 18  18 18  ?Temp: 98 ?F (36.7 ?C)  98 ?F (36.7 ?C) 97.9 ?F (36.6 ?C)  ?TempSrc:    Oral  ?SpO2: 98%  98% 97%  ?Weight:  67.3 kg    ?Height:      ? ? ?General exam: Middle-age male, moderately built and nourished lying comfortably propped up in bed without distress.  Oral mucosa moist ?Respiratory system: Clear to auscultation.  No increased work of breathing. ?Cardiovascular system: S1 and S2 heard, RRR.  No JVD, murmurs or pedal edema. ?Gastrointestinal system: Abdomen is nondistended, soft and nontender. No organomegaly or masses felt. Normal bowel sounds heard. ?Central nervous system: Alert and oriented. No focal neurological deficits. ?Extremities: Symmetric 5 x 5 power. ?Skin: Minimal swelling/bogginess without of left [place] joint without erythema.  Tenderness of right shoulder joint without swelling, improved with increased mobility today.  Mild swelling, warmth  and tenderness of left knee joint, improving and able to move left lower extremity better. ?Psychiatry: Judgement and insight appear normal. Mood & affect appropriate.  ? ?Data Reviewed:   ?I have personally reviewed following labs and imaging studies ? ?CBC: ?Recent Labs  ?Lab 11/22/21 ?1608 11/23/21 ?0423 11/24/21 ?0502 11/25/21 ?0519  ?WBC 7.4 8.5 10.3 7.6  ?NEUTROABS 6.0 7.4  --   --   ?HGB 14.5 13.7 13.5 13.9  ?HCT 42.7 39.7 39.7 40.1  ?MCV 87.0 85.9 86.5 85.7  ?PLT 88* 89* 95* 100*  ? ?Basic Metabolic Panel: ?Recent Labs  ?Lab 11/22/21 ?1608 11/22/21 ?1808 11/23/21 ?0423 11/24/21 ?0502 11/25/21 ?0519 11/25/21 ?0718  ?NA 131*  --  133* 133* 132*  --   ?K 3.5  --  3.4* 3.4* 3.4*  --   ?CL 95*  --  98 99 100  --   ?CO2 20*  --  24 25 26  --   ?GLUCOSE 368*  --  377* 250* 242*  --   ?BUN 24*  --  28* 31* 26*  --   ?CREATININE 0.88  --  1.14 0.92 0.65  --   ?CALCIUM 8.4*  --  7.8* 7.5* 7.6*  --   ?MG  --  2.4 2.7*  --   --  2.8*  ?PHOS  --   --  2.4* 2.0*  --   --   ? ?Liver Function Tests: ?Recent Labs  ?Lab 11/22/21 ?1608 11/23/21 ?0423 11/24/21 ?0502 11/25/21 ?0519  ?AST 66* 58* 105* 116*  ?ALT 55* 45* 56* 66*  ?ALKPHOS 151* 139* 209* 265*  ?BILITOT 1.6* 1.0 1.5* 1.3*  ?PROT   6.2* 5.6* 5.3* 5.5*  ?ALBUMIN 2.9* 2.3* 2.2* 2.1*  ? ?CBG: ?Recent Labs  ?Lab 11/24/21 ?2112 11/25/21 ?0733 11/25/21 ?1142  ?GLUCAP 408* 248* 306*  ? ?Microbiology Studies:  ? ?Recent Results (from the past 240 hour(s))  ?Blood culture (routine x 2)     Status: Abnormal  ? Collection Time: 11/22/21  4:10 PM  ? Specimen: BLOOD  ?Result Value Ref Range Status  ? Specimen Description   Final  ?  BLOOD BLOOD LEFT FOREARM ?Performed at [facility], [address] [place], [place] [address] ?  ? Special Requests   Final  ?  BOTTLES DRAWN AEROBIC AND ANAEROBIC Blood Culture results may not be optimal due to an inadequate volume of blood received in culture bottles ?Performed at [facility], [address] [place], [place] [address] ?  ? Culture  Setup Time   Final  ?  GRAM POSITIVE COCCI ?IN BOTH AEROBIC AND ANAEROBIC BOTTLES ?Organism ID to follow ?CRITICAL RESULT CALLED TO, READ BACK BY AND VERIFIED WIT

## 2021-11-25 NOTE — Progress Notes (Signed)
Notified on call provider about patient's CBG being 408. On call provider told nurse to give patient 5 units of Novolog. ?

## 2021-11-25 NOTE — Progress Notes (Signed)
Notified on call provider about patient's RUE having edema, tightness, stiffness, pain when lifting the arm, and warm to the touch. Did not see any erythema. Elevated the patient's RUE with pillows. ?

## 2021-11-26 ENCOUNTER — Inpatient Hospital Stay (HOSPITAL_COMMUNITY): Payer: Medicare Other

## 2021-11-26 DIAGNOSIS — M7989 Other specified soft tissue disorders: Secondary | ICD-10-CM

## 2021-11-26 DIAGNOSIS — M79601 Pain in right arm: Secondary | ICD-10-CM | POA: Diagnosis not present

## 2021-11-26 DIAGNOSIS — M009 Pyogenic arthritis, unspecified: Secondary | ICD-10-CM | POA: Diagnosis not present

## 2021-11-26 DIAGNOSIS — R7881 Bacteremia: Secondary | ICD-10-CM | POA: Diagnosis not present

## 2021-11-26 DIAGNOSIS — E876 Hypokalemia: Secondary | ICD-10-CM | POA: Diagnosis not present

## 2021-11-26 LAB — COMPREHENSIVE METABOLIC PANEL
ALT: 79 U/L — ABNORMAL HIGH (ref 0–44)
AST: 134 U/L — ABNORMAL HIGH (ref 15–41)
Albumin: 2.2 g/dL — ABNORMAL LOW (ref 3.5–5.0)
Alkaline Phosphatase: 342 U/L — ABNORMAL HIGH (ref 38–126)
Anion gap: 11 (ref 5–15)
BUN: 28 mg/dL — ABNORMAL HIGH (ref 8–23)
CO2: 24 mmol/L (ref 22–32)
Calcium: 8 mg/dL — ABNORMAL LOW (ref 8.9–10.3)
Chloride: 98 mmol/L (ref 98–111)
Creatinine, Ser: 0.7 mg/dL (ref 0.61–1.24)
GFR, Estimated: >60 mL/min (ref >60)
Glucose, Bld: 159 mg/dL — ABNORMAL HIGH (ref 70–99)
Potassium: 4 mmol/L (ref 3.5–5.1)
Sodium: 133 mmol/L — ABNORMAL LOW (ref 135–145)
Total Bilirubin: 1.7 mg/dL — ABNORMAL HIGH (ref 0.3–1.2)
Total Protein: 6 g/dL — ABNORMAL LOW (ref 6.5–8.1)

## 2021-11-26 LAB — CBC
HCT: 43.6 % (ref 39.0–52.0)
Hemoglobin: 14.9 g/dL (ref 13.0–17.0)
MCH: 29.5 pg (ref 26.0–34.0)
MCHC: 34.2 g/dL (ref 30.0–36.0)
MCV: 86.3 fL (ref 80.0–100.0)
Platelets: 127 10*3/uL — ABNORMAL LOW (ref 150–400)
RBC: 5.05 MIL/uL (ref 4.22–5.81)
RDW: 14.6 % (ref 11.5–15.5)
WBC: 9.3 10*3/uL (ref 4.0–10.5)
nRBC: 0.2 % (ref 0.0–0.2)

## 2021-11-26 LAB — GLUCOSE, CAPILLARY
Glucose-Capillary: 168 mg/dL — ABNORMAL HIGH (ref 70–99)
Glucose-Capillary: 230 mg/dL — ABNORMAL HIGH (ref 70–99)
Glucose-Capillary: 124 mg/dL — ABNORMAL HIGH (ref 70–99)
Glucose-Capillary: 234 mg/dL — ABNORMAL HIGH (ref 70–99)

## 2021-11-26 LAB — PHOSPHORUS: Phosphorus: 2.2 mg/dL — ABNORMAL LOW (ref 2.5–4.6)

## 2021-11-26 IMAGING — DX DG SHOULDER 2+V*R*
3 series · 3 of 3 positions shown · non-contrast
Comparison: None.

CLINICAL DATA: RIGHT posterior shoulder pain, RIGHT arm weakness.

EXAM:
RIGHT SHOULDER - 2+ VIEW

[shoulder ap]
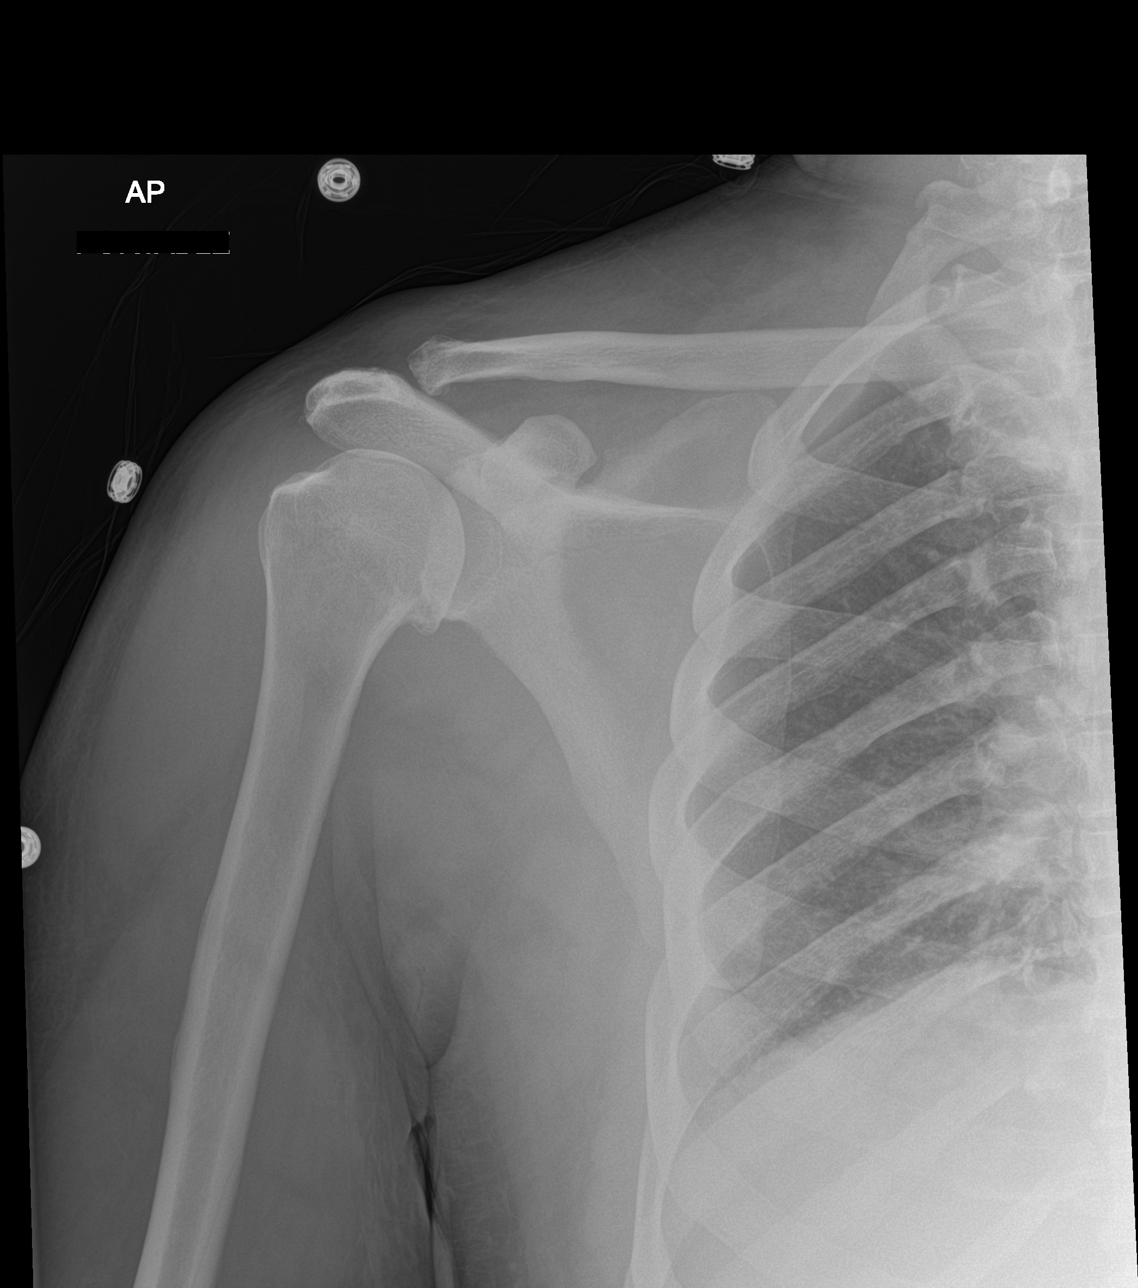

[shoulder obl (1 of 2)]
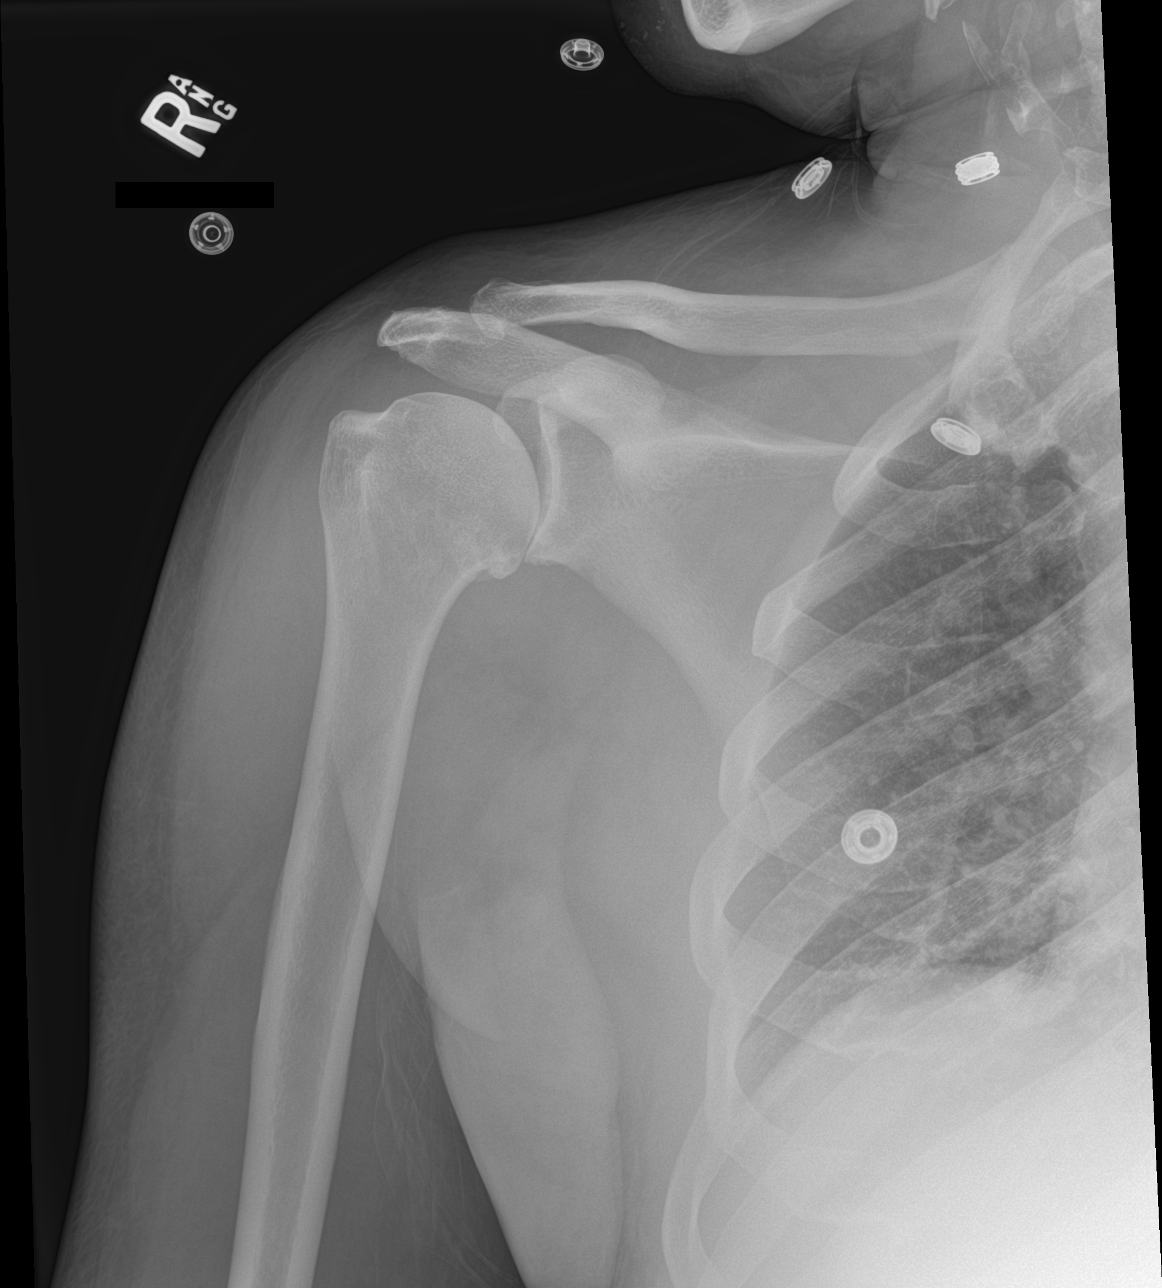

[shoulder obl (2 of 2)]
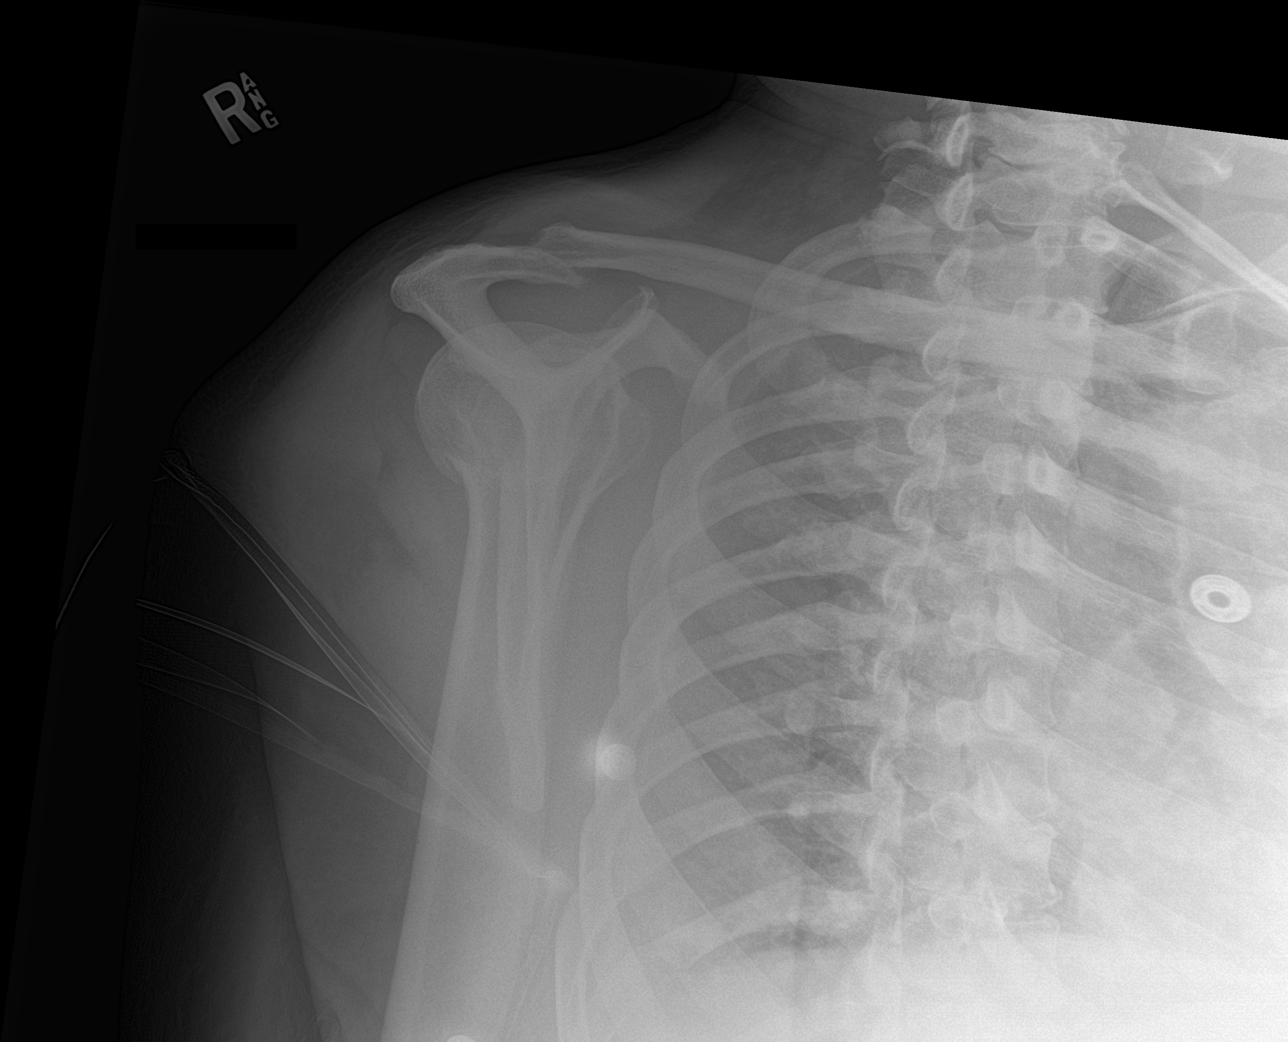

[3 of 3 positions shown; findings below may reference images not displayed]

FINDINGS: Osseous alignment is normal. No fracture line or displaced fracture
fragment. No focal cortical irregularity or osseous lesion.

Degenerative narrowing at the inferior glenohumeral joint space,
with associated spurring of the humeral head.
IMPRESSION: 1. Degenerative change at the inferior glenohumeral joint space,
with associated spurring of the humeral head, suggesting underlying
cartilage loss or meniscal derangement.
2. No acute findings.

## 2021-11-26 MED ORDER — LIDOCAINE 5 % EX PTCH
1.0000 | MEDICATED_PATCH | CUTANEOUS | Status: DC
Start: 2021-11-26 — End: 2021-12-18
  Administered 2021-11-26 – 2021-12-11 (×8): 1 via TRANSDERMAL
  Filled 2021-11-26 (×21): qty 1

## 2021-11-26 MED ORDER — HEPARIN (PORCINE) 25000 UT/250ML-% IV SOLN
1500.0000 [IU]/h | INTRAVENOUS | Status: DC
  Administered 2021-11-26: 1250 [IU]/h via INTRAVENOUS
  Filled 2021-11-26 (×2): qty 250

## 2021-11-26 MED ORDER — HEPARIN BOLUS VIA INFUSION
4000.0000 [IU] | Freq: Once | INTRAVENOUS | Status: AC
  Administered 2021-11-26: 4000 [IU] via INTRAVENOUS
  Filled 2021-11-26: qty 4000

## 2021-11-26 MED ORDER — K PHOS MONO-SOD PHOS DI & MONO 155-852-130 MG PO TABS
500.0000 mg | ORAL_TABLET | Freq: Two times a day (BID) | ORAL | Status: DC
  Administered 2021-11-26: 500 mg via ORAL
  Filled 2021-11-26 (×3): qty 2

## 2021-11-26 NOTE — Plan of Care (Signed)

## 2021-11-26 NOTE — Progress Notes (Signed)
ANTICOAGULATION CONSULT NOTE - Initial Consult ? ?Pharmacy Consult for Heparin ?Indication: DVT ? ?Allergies  ?Allergen Reactions  ? Codeine Itching  ? Glipizide Er [Glipizide]   ?  SOB, malaise. He tolerates XL without issues.  ? Metformin And Related Rash  ? ? ?Patient Measurements: ?Height: 5\' 4"  (162.6 cm) ?Weight: 84.1 kg (185 lb 6.5 oz) (weighed 3 times on standing scale) ?IBW/kg (Calculated) : 59.2 ?Heparin Dosing Weight:  77 kg ? ?Vital Signs: ?Temp: 98.2 ?F (36.8 ?C) (04/09 1213) ?Temp Source: Oral (04/09 1213) ?BP: 130/86 (04/09 1213) ?Pulse Rate: 100 (04/09 1213) ? ?Labs: ?Recent Labs  ?  11/24/21 ?0502 11/25/21 ?01/25/22 11/26/21 ?0358  ?HGB 13.5 13.9 14.9  ?HCT 39.7 40.1 43.6  ?PLT 95* 100* 127*  ?CREATININE 0.92 0.65 0.70  ? ? ?Estimated Creatinine Clearance: 90.1 mL/min (by C-G formula based on SCr of 0.7 mg/dL). ? ? ?Medical History: ?Past Medical History:  ?Diagnosis Date  ? Diabetes mellitus without complication (HCC)   ? ? ?Assessment: ?AC/Heme: ?D-dimer 5.37.  CTa chest negative for PE. + new L DVT. Thrombocytopenia appears chronic ? ?Goal of Therapy:  ?Heparin level 0.3-0.7 units/ml ?Monitor platelets by anticoagulation protocol: Yes ?  ?Plan:  ?Heparin 4000 unit IV bolus ?Heparin infusion at 1250 units/hr ?Check heparin level in 6-8 hr ?Daily HL and CBC ? ?Calla Wedekind S. 01/26/22, PharmD, BCPS ?Clinical Staff Pharmacist [email or web address] ?Merilynn Finland, Merilynn Finland ?11/26/2021,5:47 PM ? ? ?

## 2021-11-26 NOTE — Progress Notes (Signed)
RUE & LLE venous duplex exams have been completed.  Preliminary findings given to Dr. Algis Liming via secure chat. ? ? ?Results can be found under chart review under CV PROC. ?11/26/2021 5:18 PM ?Maren Wiesen RVT, RDMS ? ?

## 2021-11-26 NOTE — Progress Notes (Signed)
?PROGRESS NOTE ?  ?David Strong  NAT:[id number]    DOB: [birth year]    DOA: 11/22/2021 ? ?PCP: Horald Pollen, MD  ? ?I have briefly reviewed patients previous medical records in [facility]. ? ?Chief Complaint  ?Patient presents with  ? Muscle Pain  ? ? ?Hospital Course:  ?66 year old male with medical history significant for type II DM, presented to the ED with a couple days history of progressive productive cough, subjective fevers, chills, myalgia, severe arthralgia and new onset erythema and swelling on the left side of the base of the neck.  CTA showed no PE, confirmed RML and bibasilar airspace opacities consistent with multifocal pneumonia and also left sternoclavicular joint inflammatory changes with scattered foci of air within the soft tissues and sternoclavicular joint space compatible with localized left sternoclavicular septic arthritis/cellulitis, without evidence of drainable fluid collection or large joint effusion.  Subsequent BCID and urine antigen positive for strep.  Antibiotics narrowed to IV ceftriaxone.  Admitted for severe sepsis, POA due to streptococcal pneumonia with bacteremia, left sternoclavicular joint septic arthritis and other large joint arthralgia/arthritis (right shoulder, left knee).  Clinically improving.  Biggest issue is now arthralgia of right shoulder with painful elevation.  Possible discharge home 4/10. ? ?Assessment and Plan: ?* Severe sepsis ([place]) ?Due to streptococcal multifocal pneumonia with bacteremia, left sternoclavicular joint septic arthritis and other polyarthralgia/arthritis (right shoulder, left knee) ?Met sepsis criteria on admission including persistent tachypnea, tachycardia and did spike a fever of 100.6 overnight of admission.  CTA chest confirmed multifocal pneumonia.  Elevated lactate. ?Treated with normal saline bolus 1 L, followed by maintenance IV fluids. ?Empirically started on IV vancomycin, ceftriaxone and azithromycin. ?BCID and urine  positive for Streptococcus.  Antibiotics then narrowed to IV ceftriaxone. ?Infectious disease consultation appreciated.  ?TTE: LVEF 60-65%.  No evidence of valvular vegetations.  Surveillance blood cultures 4/7: NTD ?Minimal fluid on ultrasound of left [place] joint, patient declined aspiration and IR also felt that there was not much to aspirate. ?Sepsis physiology has resolved but still with significant arthralgias/arthritis of above joints which is now improving with NSAIDs, continue for a day or 2. ?Discussed with ID, could possibly DC on approximately 2 weeks of oral antibiotics in a day or 2 pending further improvement in his arthralgia and mobility. ?Ongoing right shoulder pain but not as prominent as prior, also right upper arm swelling and reports some left leg swelling, right shoulder x-ray without acute findings, requested RUE and LLE venous Dopplers to rule out DVT although low index of suspicion.  OT seen and do not recommend any follow-up for right shoulder. ?Continue IV antibiotics for additional 24 hours and hopefully can discharge on oral antibiotics on 4/10. ? ?Septic arthritis ([place]) ?Left sternoclavicular joint septic arthritis ?Has other polyarthralgias/arthritis (right shoulder and left knee) ?CTA chest report as noted above. ?On admission, case was discussed by EDP with on-call CT surgeon who felt that case and imaging were consistent with septic arthritis of the left sternoclavicular joint and recommended medical management and IV antibiotics without need for any surgical intervention. ?Per ultrasound, minimal fluid in left [place] joint, IR did not think would be high yield and moreover patient declined aspiration. ? ?CAP (community acquired pneumonia) ?Pneumococcal pneumonia ?Management as noted above. ?As per patient's report, patient's 60-year-old son who goes to daycare had runny nose a couple days prior to patient getting sick. ? ?High anion gap metabolic acidosis ?Lactic acidosis ?Secondary to  severe sepsis. ?Presented with lactate of 2.3 which  has improved to 2.1.  Bicarbonate normal.  Anion gap has normalized. ?Resolved. ? ?Transaminitis ?Mild and likely related to severe sepsis. ?No GI symptoms. ?Acetaminophen level 14, salicylate level less than 7 and acute hepatitis panel negative.  UDS positive only for opiates. ?LFTs are actually slowly creeping up.?  Related to antibiotics or sepsis itself. ?Follow CMP in a.m. ? ?Thrombocytopenia ([place]) ?Suspect chronic or acute on chronic ?Platelet counts in 2018: 104 ?Slowly improving. ? ?Type 2 diabetes mellitus ([place]) ?A1c 8 on 10/12/2021. ?On glipizide and dapagliflozin at home.  Currently held. ?CBGs uncontrolled and fluctuating.  Increased Semglee to 18 units daily continue NovoLog SSI moderate sensitivity and increased NovoLog meal time 5 units 3 times daily ?Diabetes coordinator consulted ?Monitor CBGs closely and adjust insulins as needed. ? ?Hypokalemia ?Replaced.  Magnesium 2.8. ? ?Hypophosphatemia ?Continue to replace and follow in a.m. ? ? ?Body mass index is 31.83 kg/m?. ? ?DVT prophylaxis: SCDs Start: 11/22/21 1920   ?  Code Status: Full Code:  ?Family Communication: None at bedside ?Status is: Inpatient ?Remains inpatient appropriate because: Bacteremia, sepsis, pneumonia requiring IV antibiotics, close monitoring ?  ? ?Consultants:   ?Infectious disease ?IR ? ?Procedures:   ?None ? ?Antimicrobials:   ?IV ceftriaxone ? ? ?Subjective:  ?Overall continues to improve.  Reports that he was up and ambulating in the room, sat in the chair for several hours.  Right shoulder pain present but better.  Swelling of right upper arm and left leg.  No other complaints reported. ?Objective:  ? ?Vitals:  ? 11/25/21 2208 11/26/21 0500 11/26/21 0643 11/26/21 1213  ?BP:   115/82 130/86  ?Pulse:   100 100  ?Resp:   20 19  ?Temp: 98.3 ?F (36.8 ?C)  98.1 ?F (36.7 ?C) 98.2 ?F (36.8 ?C)  ?TempSrc: Oral  Oral Oral  ?SpO2:   98% 95%  ?Weight:  84.1 kg    ?Height:       ? ? ?General exam: Middle-age male, moderately built and nourished lying comfortably propped up in bed without distress.  Oral mucosa moist ?Respiratory system: Clear to auscultation.  No increased work of breathing. ?Cardiovascular system: S1 and S2 heard, RRR.  No JVD, murmurs or pedal edema. ?Gastrointestinal system: Abdomen is nondistended, soft and nontender. No organomegaly or masses felt. Normal bowel sounds heard. ?Central nervous system: Alert and oriented. No focal neurological deficits. ?Extremities: Symmetric 5 x 5 power. ?Skin: Minimal swelling/bogginess without of left [place] joint without erythema.  Tenderness of right shoulder joint without swelling, improved but still having a lot of difficulty elevating right upper extremity at shoulder against gravity.  Mild swelling, warmth and tenderness of left knee joint, improving and able to move left lower extremity better. ?Psychiatry: Judgement and insight appear normal. Mood & affect appropriate.  ? ?Data Reviewed:   ?I have personally reviewed following labs and imaging studies ? ?CBC: ?Recent Labs  ?Lab 11/22/21 ?1608 11/23/21 ?0423 11/24/21 ?0502 11/25/21 ?1610 11/26/21 ?0358  ?WBC 7.4 8.5 10.3 7.6 9.3  ?NEUTROABS 6.0 7.4  --   --   --   ?HGB 14.5 13.7 13.5 13.9 14.9  ?HCT 42.7 39.7 39.7 40.1 43.6  ?MCV 87.0 85.9 86.5 85.7 86.3  ?PLT 88* 89* 95* 100* 127*  ? ?Basic Metabolic Panel: ?Recent Labs  ?Lab 11/22/21 ?1608 11/22/21 ?1808 11/23/21 ?0423 11/24/21 ?0502 11/25/21 ?9604 11/25/21 ?5409 11/26/21 ?0358  ?NA 131*  --  133* 133* 132*  --  133*  ?K 3.5  --  3.4* 3.4* 3.4*  --  4.0  ?CL 95*  --  98 99 100  --  98  ?CO2 20*  --  '24 25 26  ' --  24  ?GLUCOSE 368*  --  377* 250* 242*  --  159*  ?BUN 24*  --  28* 31* 26*  --  28*  ?CREATININE 0.88  --  1.14 0.92 0.65  --  0.70  ?CALCIUM 8.4*  --  7.8* 7.5* 7.6*  --  8.0*  ?MG  --  2.4 2.7*  --   --  2.8*  --   ?PHOS  --   --  2.4* 2.0*  --   --  2.2*  ? ?Liver Function Tests: ?Recent Labs  ?Lab 11/22/21 ?1608  11/23/21 ?0423 11/24/21 ?0502 11/25/21 ?9532 11/26/21 ?0358  ?AST 66* 58* 105* 116* 134*  ?ALT 55* 45* 56* 66* 79*  ?ALKPHOS 151* 139* 209* 265* 342*  ?BILITOT 1.6* 1.0 1.5* 1.3* 1.7*  ?PROT 6.2* 5.6* 5.3* 5.5* 6.0*

## 2021-11-26 NOTE — Progress Notes (Signed)
Addendum ? ?Received the venous Doppler results. ? ?Right upper extremity: SVT.  Supportive treatment.  Would avoid NSAIDs given starting anticoagulation below. ? ?Left lower extremity: Age-indeterminate DVT left popliteal vein, acute DVT left gastrocnemius and left posterior tibial veins. ? ?Given hypercoagulable state from current severe sepsis and left popliteal vein DVT, would anticoagulate for at least 3 months.  Does have thrombocytopenia but this is improving.  Discussed in detail with patient regarding risks versus benefits of anticoagulation, anticoagulation options including parenteral meds while hospitalized including IV heparin/subcutaneous Lovenox and will discuss oral options tomorrow.  Patient agrees to anticoagulation and IV heparin.  Monitor overnight. ? ?Marcellus Scott, MD,  ?FACP, [facility], [facility], [facility] (Care Management Physician Certified) ?[facility] ?[place] ? ?To contact the attending provider between 7A-7P or the covering provider during after hours 7P-7A, please log into the web site [email or web address] and access using universal  password for that web site. If you do not have the password, please call the hospital operator. ? ?

## 2021-11-26 NOTE — Evaluation (Signed)
Physical Therapy Evaluation ?Patient Details ?Name: David Strong ?MRN: [id number] ?DOB: January 20, 1956 ?Today's Date: 11/26/2021 ? ?History of Present Illness ? 66 year old male with medical history significant for type II DM, presented to the ED with a couple days history of progressive productive cough, subjective fevers, chills, myalgia, severe arthralgia and new onset erythema and swelling on the left side of the base of the neck.  CTA showed no PE, confirmed RML and bibasilar airspace opacities consistent with multifocal pneumonia and also left sternoclavicular joint inflammatory changes with scattered foci of air within the soft tissues and sternoclavicular joint space compatible with localized left sternoclavicular septic arthritis/cellulitis, without evidence of drainable fluid collection or large joint effusion.  Subsequent BCID and urine antigen positive for strep.  Antibiotics narrowed to IV ceftriaxone.  Admitted for severe sepsis, POA due to streptococcal pneumonia with bacteremia, left sternoclavicular joint septic arthritis and other large joint arthralgia/arthritis (right shoulder, left knee).  IR planning ultrasound-guided possible aspiration of left [place] joint.  ?Clinical Impression ? Pt admitted as above and presenting with functional mobility limitations 2* decreased strength R UE and L LE; pain in R shoulder, L knee and back, and ambulatory balance deficits and decreased activity tolerance 2* same.  This date, pt up to ambulate limited distance requiring RW for support/balance and with noted mild buckling at L knee.  Pt should progress to dc home with family assist and would benefit from follow up HHPT to maximize IND and safety at home.  ? ?Recommendations for follow up therapy are one component of a multi-disciplinary discharge planning process, led by the attending physician.  Recommendations may be updated based on patient status, additional functional criteria and insurance authorization. ? ?Follow  Up Recommendations Home health PT ? ?  ?Assistance Recommended at Discharge Intermittent Supervision/Assistance  ?Patient can return home with the following ? A little help with walking and/or transfers;A little help with bathing/dressing/bathroom;Assistance with cooking/housework;Assist for transportation;Help with stairs or ramp for entrance ? ?  ?Equipment Recommendations Rolling walker (2 wheels)  ?Recommendations for Other Services ?    ?  ?Functional Status Assessment Patient has had a recent decline in their functional status and demonstrates the ability to make significant improvements in function in a reasonable and predictable amount of time.  ? ?  ?Precautions / Restrictions Precautions ?Precautions: Fall ?Precaution Comments: mild buckling at R knee with ambulation ?Restrictions ?Weight Bearing Restrictions: No  ? ?  ? ?Mobility ? Bed Mobility ?Overal bed mobility: Needs Assistance ?Bed Mobility: Supine to Sit ?  ?  ?Supine to sit: Supervision ?  ?  ?General bed mobility comments: Increased time with use of rail ?  ? ?Transfers ?Overall transfer level: Needs assistance ?Equipment used: Rolling walker (2 wheels) ?Transfers: Sit to/from Stand ?Sit to Stand: Min guard ?  ?  ?  ?  ?  ?General transfer comment: Steady assist with cues for transition position use of UEs to self assist ?  ? ?Ambulation/Gait ?Ambulation/Gait assistance: Min guard ?Gait Distance (Feet): 54 Feet ?Assistive device: Rolling walker (2 wheels) ?Gait Pattern/deviations: Step-to pattern, Decreased step length - right, Decreased step length - left, Shuffle, Trunk flexed ?Gait velocity: decr ?  ?  ?General Gait Details: Increased time with multiple standing rest breaks and noted mild buckling at L knee.  Cues for sequence and position from RW ? ?Stairs ?  ?  ?  ?  ?  ? ?Wheelchair Mobility ?  ? ?Modified Rankin (Stroke Patients Only) ?  ? ?  ? ?  Balance Overall balance assessment: Mild deficits observed, not formally tested ?  ?  ?  ?  ?   ?  ?  ?  ?  ?  ?  ?  ?  ?  ?  ?  ?  ?  ?   ? ? ? ?Pertinent Vitals/Pain Pain Assessment ?Pain Assessment: 0-10 ?Pain Score: 9  ?Pain Location: Back, right shoulder, left knee ?Pain Descriptors / Indicators: Grimacing, Sharp ?Pain Intervention(s): Limited activity within patient's tolerance, Monitored during session  ? ? ?Home Living Family/patient expects to be discharged to:: Private residence ?Living Arrangements: Spouse/significant other;Children ?Available Help at Discharge: Family ?Type of Home: House ?Home Access: Stairs to enter ?Entrance Stairs-Rails: Right ?Entrance Stairs-Number of Steps: 4 ?Alternate Level Stairs-Number of Steps: flight ?Home Layout: Two level ?Home Equipment: None ?   ?  ?Prior Function Prior Level of Function : Independent/Modified Independent ?  ?  ?  ?  ?  ?  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  ?  ? ? ?Hand Dominance  ? Dominant Hand: Right ? ?  ?Extremity/Trunk Assessment  ? Upper Extremity Assessment ?Upper Extremity Assessment: Defer to OT evaluation ?RUE Deficits / Details: Active assist to achieve functional flexion, ER PROM to approx 45 degrees, elbow, forearm and hand WFL ROM with good strength, edema in right hand and around glenoidhumeral joint - limited by pain, ?RUE Sensation: WNL ?RUE Coordination: WNL ?LUE Deficits / Details: WFL ROM and 5/5 strength ?LUE Sensation: WNL ?LUE Coordination: WNL ?  ? ?Lower Extremity Assessment ?Lower Extremity Assessment: LLE deficits/detail ?LLE Deficits / Details: AROM WFL but with strength testing tolerated to 4-/5 only ?  ? ?Cervical / Trunk Assessment ?Cervical / Trunk Assessment: Normal  ?Communication  ? Communication: No difficulties  ?Cognition Arousal/Alertness: Awake/alert ?Behavior During Therapy: [facility] for tasks assessed/performed ?Overall Cognitive Status: Within Functional Limits for tasks assessed ?  ?  ?  ?  ?  ?  ?  ?  ?  ?  ?  ?  ?  ?  ?  ?  ?  ?  ?  ? ?   ?General Comments   ? ?  ?Exercises    ? ?Assessment/Plan  ?  ?PT Assessment Patient needs continued PT services  ?PT Problem List Decreased strength;Decreased range of motion;Decreased activity tolerance;Decreased balance;Decreased mobility;Decreased knowledge of use of DME;Pain ? ?   ?  ?PT Treatment Interventions Gait training;Stair training;DME instruction;Functional mobility training;Therapeutic activities;Therapeutic exercise;Balance training;Patient/family education   ? ?PT Goals (Current goals can be found in the Care Plan section)  ?Acute Rehab PT Goals ?Patient Stated Goal: Regain IND; less pain ?PT Goal Formulation: With patient ?Time For Goal Achievement: 12/10/21 ?Potential to Achieve Goals: Good ? ?  ?Frequency Min 3X/week ?  ? ? ?Co-evaluation   ?  ?  ?  ?  ? ? ?  ?AM-PAC PT "6 Clicks" Mobility  ?Outcome Measure Help needed turning from your back to your side while in a flat bed without using bedrails?: None ?Help needed moving from lying on your back to sitting on the side of a flat bed without using bedrails?: A Little ?Help needed moving to and from a bed to a chair (including a wheelchair)?: A Little ?Help needed standing up from a chair using your arms (e.g., wheelchair or bedside chair)?: A Little ?Help needed to walk in hospital room?: A Little ?Help needed climbing 3-5 steps with a railing? : A Lot ?  6 Click Score: 18 ? ?  ?End of Session Equipment Utilized During Treatment: Gait belt ?Activity Tolerance: Patient limited by pain ?Patient left: in chair;with call bell/phone within reach;with chair alarm set ?Nurse Communication: Mobility status ?PT Visit Diagnosis: Difficulty in walking, not elsewhere classified (R26.2);Pain ?Pain - part of body: Knee;Shoulder (L knee, R shoulder, back) ?  ? ?Time: [id number] ?PT Time Calculation (min) (ACUTE ONLY): 28 min ? ? ?Charges:   PT Evaluation ?$PT Eval Low Complexity: 1 Low ?  ?  ?   ? ? ?Debe Coder PT ?Acute Rehabilitation Services ?Pager  [phone] ?Office [phone] ? ? ?Kadajah Kjos ?11/26/2021, 4:42 PM ? ?

## 2021-11-26 NOTE — Evaluation (Signed)
Occupational Therapy Evaluation ?Patient Details ?Name: David CavaSam Strong ?MRN: [id number] ?DOB: 11/12/1955 ?Today's Date: 11/26/2021 ? ? ?History of Present Illness 66 year old male with medical history significant for type II DM, presented to the ED with a couple days history of progressive productive cough, subjective fevers, chills, myalgia, severe arthralgia and new onset erythema and swelling on the left side of the base of the neck.  CTA showed no PE, confirmed RML and bibasilar airspace opacities consistent with multifocal pneumonia and also left sternoclavicular joint inflammatory changes with scattered foci of air within the soft tissues and sternoclavicular joint space compatible with localized left sternoclavicular septic arthritis/cellulitis, without evidence of drainable fluid collection or large joint effusion.  Subsequent BCID and urine antigen positive for strep.  Antibiotics narrowed to IV ceftriaxone.  Admitted for severe sepsis, POA due to streptococcal pneumonia with bacteremia, left sternoclavicular joint septic arthritis and other large joint arthralgia/arthritis (right shoulder, left knee).  IR planning ultrasound-guided possible aspiration of left [place] joint.  ? ?Clinical Impression ?  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 Patient min assist for ADLs due to decreased use of dominant upper extremity and min guard for ambulation with walker. Patient shown some range of motion exercises with use of cane (external rotation, horizontal abduction, flexion). Will need f/u in hospital to reiterate exercises and to learn compensatory strategies for UB ADLs but do not expect OT needs at discharge. He will have assistance of wife at home. May need therapy on his shoulder if deficits  remain when pain decreases - and expect HH PT could monitor.  ?   ? ?Recommendations for follow up therapy are one component of a multi-disciplinary discharge planning process, led by the attending physician.  Recommendations may be updated based on patient status, additional functional criteria and insurance authorization.  ? ?Follow Up Recommendations ? No OT follow up  ?  ?Assistance Recommended at Discharge Intermittent Supervision/Assistance  ?Patient can return home with the following A little help with bathing/dressing/bathroom;Assistance with cooking/housework ? ?  ?Functional Status Assessment ? Patient has had a recent decline in their functional status and demonstrates the ability to make significant improvements in function in a reasonable and predictable amount of time.  ?Equipment Recommendations ? None recommended by OT  ?  ?Recommendations for Other Services   ? ? ?  ?Precautions / Restrictions Restrictions ?Weight Bearing Restrictions: No  ? ?  ? ?Mobility Bed Mobility ?  ?  ?  ?  ?  ?  ?  ?  ?  ? ?Transfers ?  ?  ?  ?  ?  ?  ?  ?  ?  ?  ?  ? ?  ?Balance Overall balance assessment: Mild deficits observed, not formally tested ?  ?  ?  ?  ?  ?  ?  ?  ?  ?  ?  ?  ?  ?  ?  ?  ?  ?  ?   ? ?ADL either performed or assessed with clinical judgement  ? ?ADL Overall ADL's : Needs assistance/impaired ?Eating/Feeding: Set up ?  ?Grooming: Modified independent ?  ?Upper Body Bathing: Standing;Minimal assistance ?  ?Lower Body Bathing: Minimal assistance;Sit to/from stand ?  ?Upper Body Dressing : Minimal assistance ?  ?  Lower Body Dressing: Minimal assistance;Sit to/from stand ?  ?Toilet Transfer: Min guard;Regular Toilet;Grab bars;Rolling walker (2 wheels) ?  ?Toileting- Clothing Manipulation and Hygiene: Minimal assistance;Sit to/from stand ?  ?  ?  ?Functional mobility during ADLs: Min guard;Rolling walker (2 wheels) ?   ? ? ? ?Vision Patient Visual Report: No change from baseline ?   ?   ?Perception   ?   ?Praxis   ?  ? ?Pertinent Vitals/Pain Pain Assessment ?Pain Assessment: 0-10 ?Pain Score: 9  ?Pain Location: Back, right shoulder, left knee ?Pain Descriptors / Indicators: Grimacing, Sharp ?Pain Intervention(s): Limited activity within patient's tolerance, Premedicated before session  ? ? ? ?Hand Dominance Right ?  ?Extremity/Trunk Assessment Upper Extremity Assessment ?Upper Extremity Assessment: RUE deficits/detail;LUE deficits/detail ?RUE Deficits / Details: Active assist to achieve functional flexion, ER PROM to approx 45 degrees, elbow, forearm and hand WFL ROM with good strength, edema in right hand and around glenoidhumeral joint - limited by pain, ?RUE Sensation: WNL ?RUE Coordination: WNL ?LUE Deficits / Details: WFL ROM and 5/5 strength ?LUE Sensation: WNL ?LUE Coordination: WNL ?  ?Lower Extremity Assessment ?Lower Extremity Assessment: Defer to PT evaluation ?  ?Cervical / Trunk Assessment ?Cervical / Trunk Assessment: Normal ?  ?Communication Communication ?Communication: No difficulties ?  ?Cognition Arousal/Alertness: Awake/alert ?Behavior During Therapy: [facility] for tasks assessed/performed ?Overall Cognitive Status: Within Functional Limits for tasks assessed ?  ?  ?  ?  ?  ?  ?  ?  ?  ?  ?  ?  ?  ?  ?  ?  ?  ?  ?  ?General Comments    ? ?  ?Exercises   ?  ?Shoulder Instructions    ? ? ?Home Living Family/patient expects to be discharged to:: Private residence ?Living Arrangements: Spouse/significant other;Children ?Available Help at Discharge: Family ?Type of Home: House ?Home Access: Stairs to enter ?Entrance Stairs-Number of Steps: 4 ?Entrance Stairs-Rails: Right ?Home Layout: Two level ?  ?  ?Bathroom Shower/Tub: Walk-in shower ?  ?Bathroom Toilet: Standard ?  ?  ?Home Equipment: None ?  ?  ?  ? ?  ?Prior Functioning/Environment Prior Level of Function : Independent/Modified Independent ?  ?  ?  ?  ?  ?  ?Mobility Comments: independent, self employed as a Brewing technologist (some kind of  vacuuming) ?  ?  ? ?  ?  ?OT Problem List: Decreased strength;Decreased range of motion;Impaired UE functional use;Pain ?  ?   ?OT Treatment/Interventions: Self-care/ADL training;Therapeutic exercise;DME and/or AE instruction;Patient/family education  ?  ?OT Goals(Current goals can be found in the care plan section) Acute Rehab OT Goals ?Patient Stated Goal: be able to use arm at work ?OT Goal Formulation: With patient ?Time For Goal Achievement: 12/10/21 ?Potential to Achieve Goals: Good  ?OT Frequency: Min 2X/week ?  ? ?Co-evaluation   ?  ?  ?  ?  ? ?  ?AM-PAC OT "6 Clicks" Daily Activity     ?Outcome Measure Help from another person eating meals?: A Little ?Help from another person taking care of personal grooming?: A Little ?Help from another person toileting, which includes using toliet, bedpan, or urinal?: A Little ?Help from another person bathing (including washing, rinsing, drying)?: A Little ?Help from another person to put on and taking off regular upper body clothing?: A Lot ?Help from another person to put on and taking off regular lower body clothing?: A Little ?6 Click Score: 17 ?  ?End of Session Equipment Utilized During  Treatment: Rolling walker (2 wheels) ?Nurse Communication: Mobility status ? ?Activity Tolerance: Patient tolerated treatment well ?Patient left: in chair;with call bell/phone within reach ? ?OT Visit Diagnosis: Pain ?Pain - Right/Left: Right ?Pain - part of body: Shoulder  ?              ?Time: 1607-3710 ?OT Time Calculation (min): 34 min ?Charges:  OT General Charges ?$OT Visit: 1 Visit ?OT Evaluation ?$OT Eval Low Complexity: 1 Low ? ?Audelia Knape, OTR/L ?Acute Care Rehab Services  ?Office [phone] ?Pager: [phone]  ? ?Fleurette Woolbright L Amayiah Gosnell ?11/26/2021, 1:47 PM ?

## 2021-11-27 ENCOUNTER — Inpatient Hospital Stay (HOSPITAL_COMMUNITY): Payer: Medicare Other

## 2021-11-27 ENCOUNTER — Encounter (HOSPITAL_COMMUNITY): Payer: Self-pay | Admitting: Radiology

## 2021-11-27 DIAGNOSIS — J189 Pneumonia, unspecified organism: Secondary | ICD-10-CM | POA: Diagnosis not present

## 2021-11-27 DIAGNOSIS — I82402 Acute embolism and thrombosis of unspecified deep veins of left lower extremity: Secondary | ICD-10-CM | POA: Diagnosis not present

## 2021-11-27 DIAGNOSIS — M009 Pyogenic arthritis, unspecified: Secondary | ICD-10-CM | POA: Diagnosis not present

## 2021-11-27 DIAGNOSIS — A419 Sepsis, unspecified organism: Secondary | ICD-10-CM | POA: Diagnosis not present

## 2021-11-27 DIAGNOSIS — M00272 Other streptococcal arthritis, left ankle and foot: Secondary | ICD-10-CM

## 2021-11-27 DIAGNOSIS — K5900 Constipation, unspecified: Secondary | ICD-10-CM

## 2021-11-27 DIAGNOSIS — I82409 Acute embolism and thrombosis of unspecified deep veins of unspecified lower extremity: Secondary | ICD-10-CM

## 2021-11-27 DIAGNOSIS — E876 Hypokalemia: Secondary | ICD-10-CM | POA: Diagnosis not present

## 2021-11-27 DIAGNOSIS — R7881 Bacteremia: Secondary | ICD-10-CM | POA: Diagnosis not present

## 2021-11-27 LAB — COMPREHENSIVE METABOLIC PANEL
ALT: 84 U/L — ABNORMAL HIGH (ref 0–44)
AST: 125 U/L — ABNORMAL HIGH (ref 15–41)
Albumin: 2 g/dL — ABNORMAL LOW (ref 3.5–5.0)
Alkaline Phosphatase: 349 U/L — ABNORMAL HIGH (ref 38–126)
Anion gap: 8 (ref 5–15)
BUN: 18 mg/dL (ref 8–23)
CO2: 27 mmol/L (ref 22–32)
Calcium: 7.8 mg/dL — ABNORMAL LOW (ref 8.9–10.3)
Chloride: 98 mmol/L (ref 98–111)
Creatinine, Ser: 0.59 mg/dL — ABNORMAL LOW (ref 0.61–1.24)
GFR, Estimated: >60 mL/min (ref >60)
Glucose, Bld: 212 mg/dL — ABNORMAL HIGH (ref 70–99)
Potassium: 4.1 mmol/L (ref 3.5–5.1)
Sodium: 133 mmol/L — ABNORMAL LOW (ref 135–145)
Total Bilirubin: 1.4 mg/dL — ABNORMAL HIGH (ref 0.3–1.2)
Total Protein: 5.7 g/dL — ABNORMAL LOW (ref 6.5–8.1)

## 2021-11-27 LAB — CBC
HCT: 41.8 % (ref 39.0–52.0)
Hemoglobin: 14.9 g/dL (ref 13.0–17.0)
MCH: 30 pg (ref 26.0–34.0)
MCHC: 35.6 g/dL (ref 30.0–36.0)
MCV: 84.3 fL (ref 80.0–100.0)
Platelets: 155 10*3/uL (ref 150–400)
RBC: 4.96 MIL/uL (ref 4.22–5.81)
RDW: 14.5 % (ref 11.5–15.5)
WBC: 12 10*3/uL — ABNORMAL HIGH (ref 4.0–10.5)
nRBC: 0.2 % (ref 0.0–0.2)

## 2021-11-27 LAB — GLUCOSE, CAPILLARY
Glucose-Capillary: 236 mg/dL — ABNORMAL HIGH (ref 70–99)
Glucose-Capillary: 303 mg/dL — ABNORMAL HIGH (ref 70–99)
Glucose-Capillary: 191 mg/dL — ABNORMAL HIGH (ref 70–99)
Glucose-Capillary: 280 mg/dL — ABNORMAL HIGH (ref 70–99)

## 2021-11-27 LAB — PHOSPHORUS: Phosphorus: 3.4 mg/dL (ref 2.5–4.6)

## 2021-11-27 LAB — HEPARIN LEVEL (UNFRACTIONATED): Heparin Unfractionated: <0.1 IU/mL — ABNORMAL LOW (ref 0.30–0.70)

## 2021-11-27 IMAGING — US US ABDOMEN LIMITED
1 series · 15 of 25 positions shown · non-contrast
Comparison: No priors.

CLINICAL DATA: 65-year-old male with history of abnormal liver
function tests.

EXAM:
ULTRASOUND ABDOMEN LIMITED RIGHT UPPER QUADRANT

[Series 1: us abdomen limited ruq mc & wl · 15 of 65 slices shown]
[im 1/65]
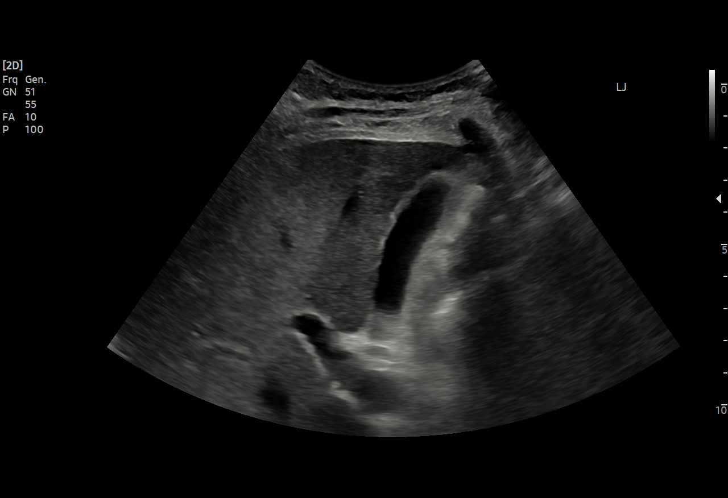
[im 6/65]
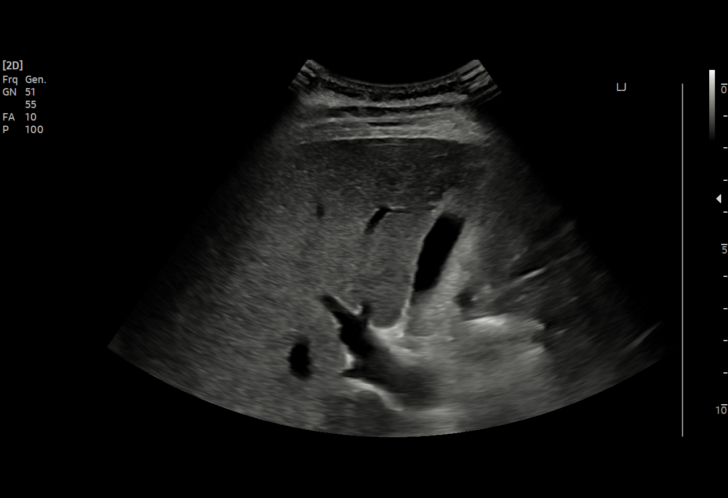
[im 11/65]
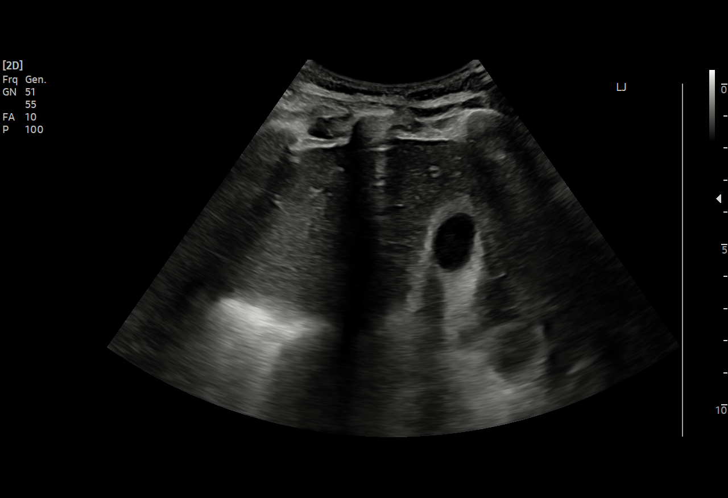
[im 14/65]
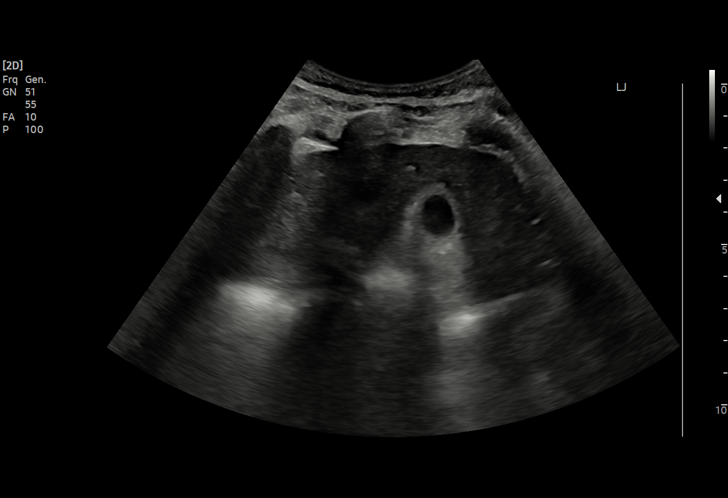
[im 19/65]
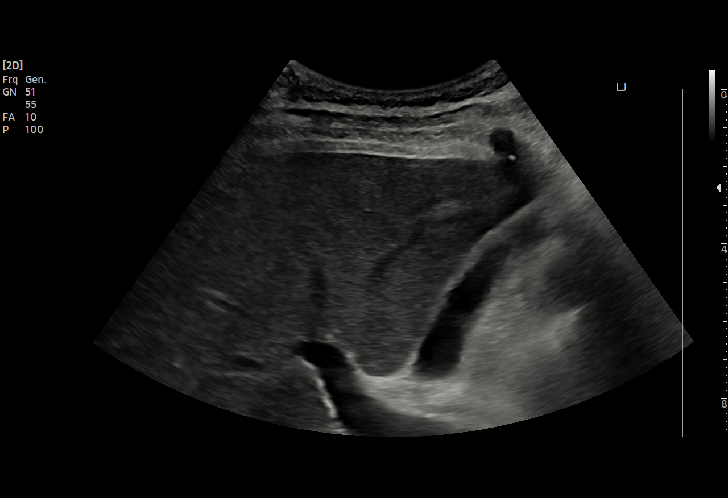
[im 25/65]
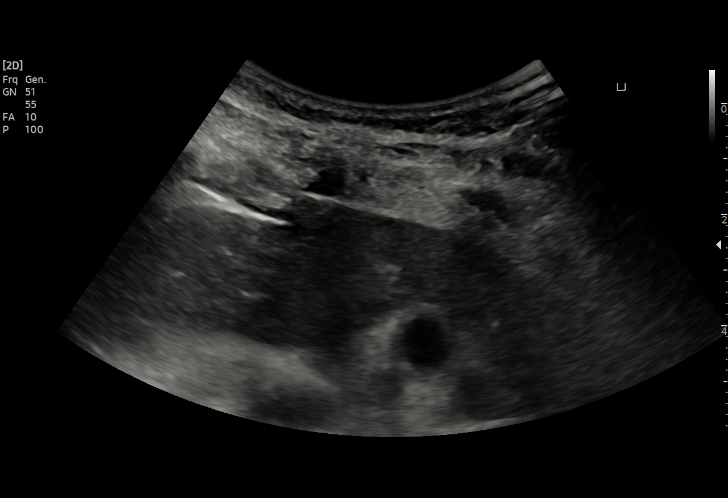
[im 27/65]
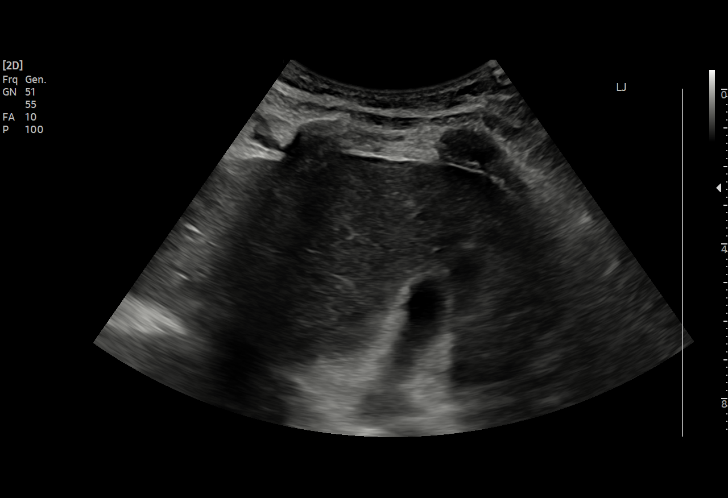
[im 33/65]
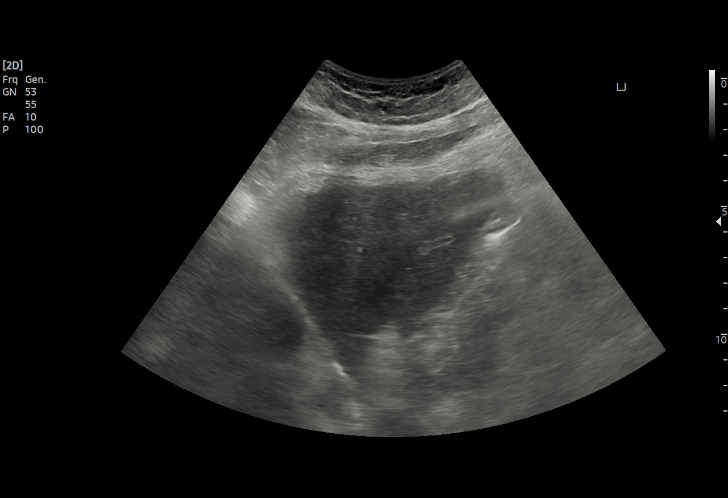
[im 38/65]
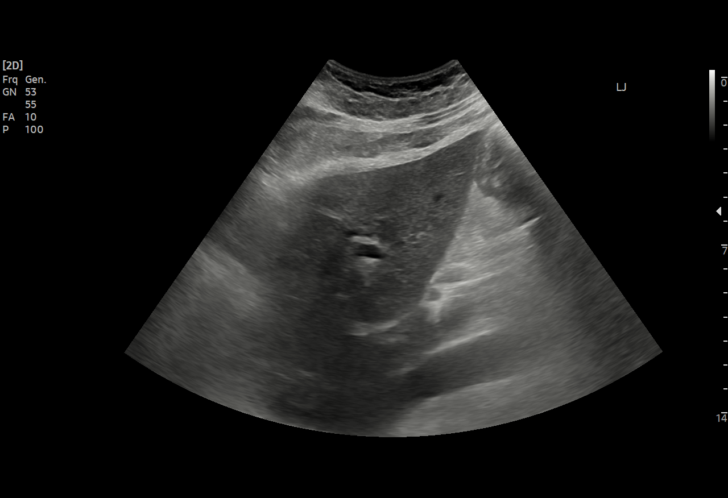
[im 41/65]
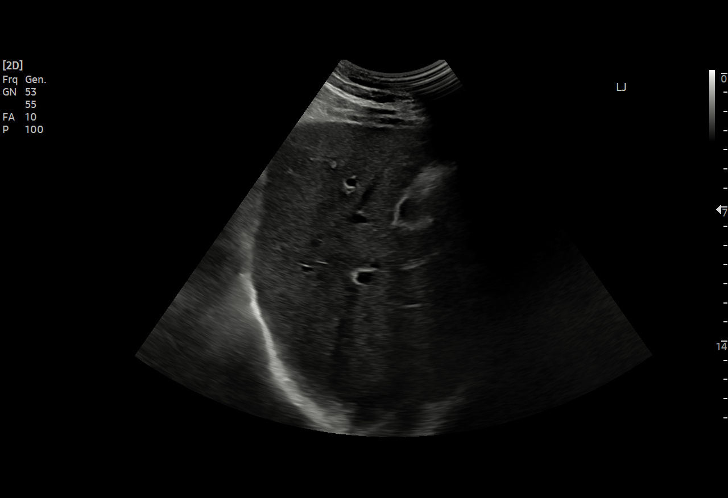
[im 46/65]
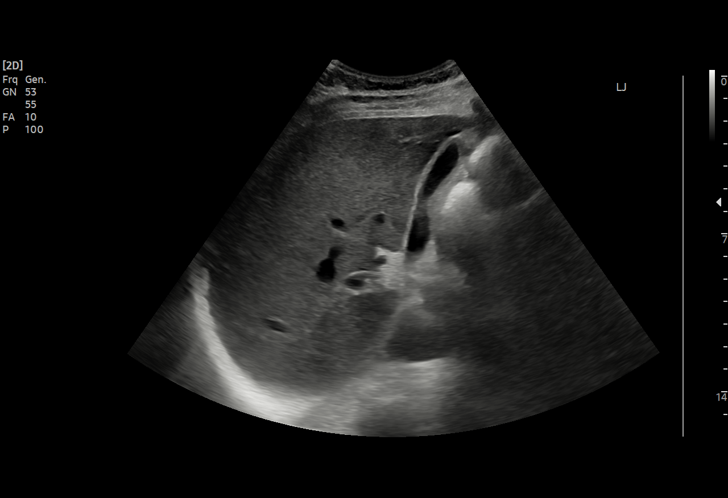
[im 51/65]
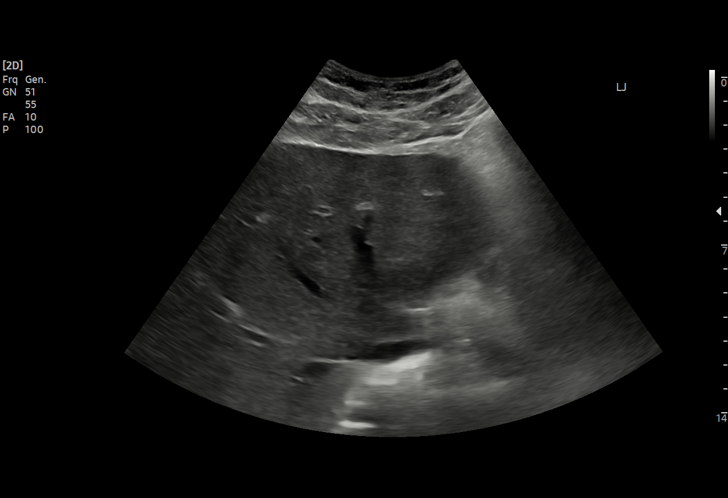
[im 54/65]
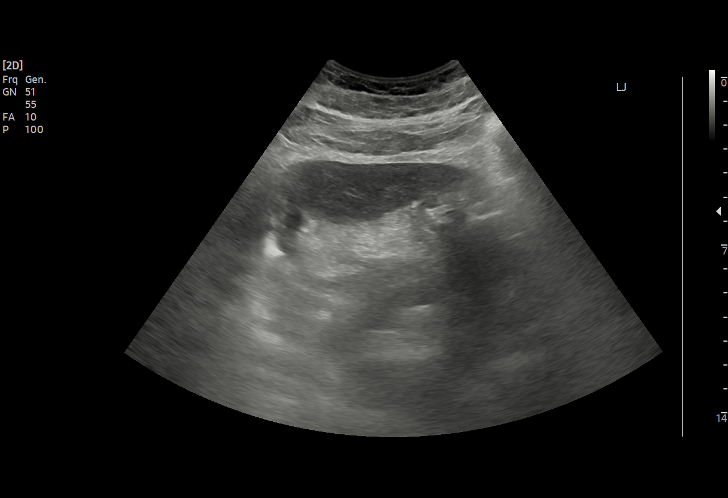
[im 59/65]
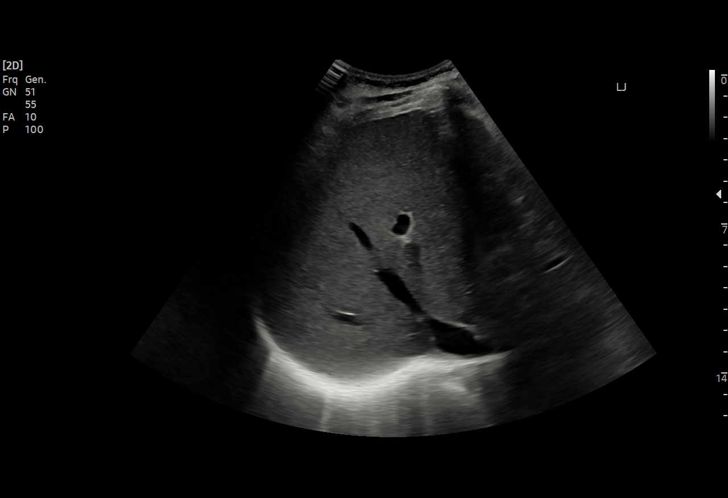
[im 65/65]
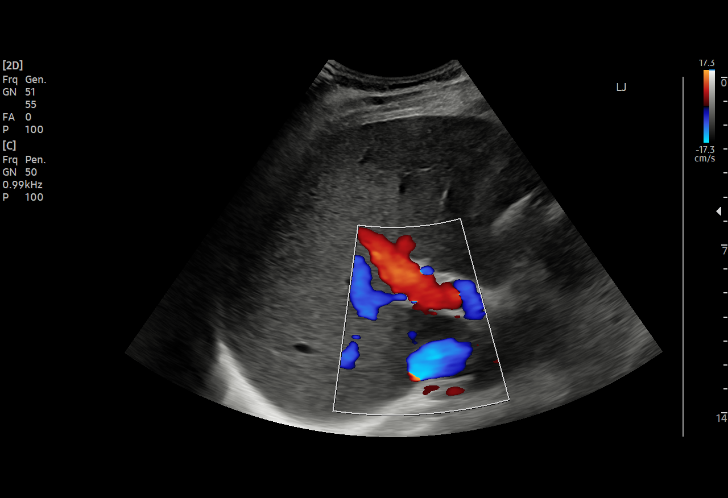

[15 of 25 positions shown; findings below may reference images not displayed]

FINDINGS: Gallbladder:

Gallbladder is under distended. No gallstones or wall thickening
visualized. There is a trace amount of echogenic nonshadowing
material lying dependently in the gallbladder, compatible with
biliary sludge. No sonographic Murphy sign noted by sonographer.

Common bile duct:

Diameter: 3.0 mm

Liver:

No focal lesion identified. Within normal limits in parenchymal
echogenicity. Portal vein is patent on color Doppler imaging with
normal direction of blood flow towards the liver.

Other: None.
IMPRESSION: 1. Small amount of biliary sludge lying dependently in the
gallbladder. No gallstones or findings to suggest an acute
cholecystitis at this time.

## 2021-11-27 IMAGING — MR MR SHOULDER*R* WO/W CM
5 of 9 series · 20 of 40 positions shown · IV contrast (gadavist)
Comparison: None.

CLINICAL DATA: Severe shoulder pain clinical suspicion for septic
arthritis.

EXAM:
MRI OF THE RIGHT SHOULDER WITHOUT AND WITH CONTRAST
TECHNIQUE: Multiplanar, multisequence MR imaging of the right shoulder was
performed before and after the administration of intravenous
contrast.
CONTRAST:  9mL GADAVIST GADOBUTROL 1 MMOL/ML IV SOLN, 10mL GADAVIST
GADOBUTROL 1 MMOL/ML IV SOLN

[Series 20: T2 fat-sat · coronal · left · 4.0mm · 0.50mm/px · 3 of 20 slices shown (1 of 4)]
[im 1/20]
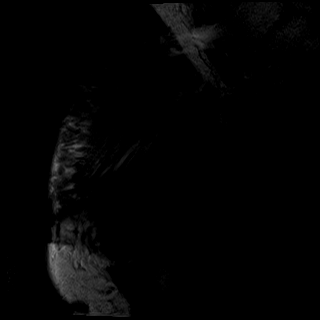
[im 10/20]
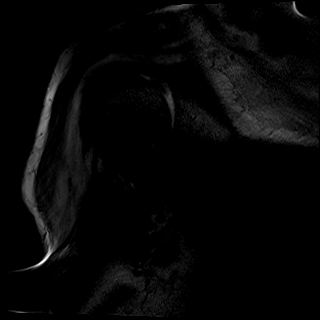
[im 20/20]
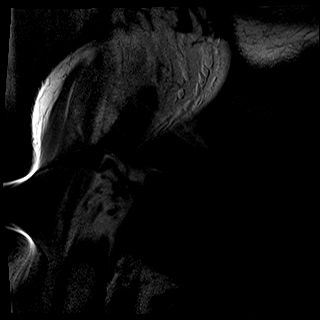

[Series 31: T2 fat-sat · coronal · right · 4.0mm · 0.33mm/px · 4 of 24 slices shown (2 of 4)]
[im 1/24]
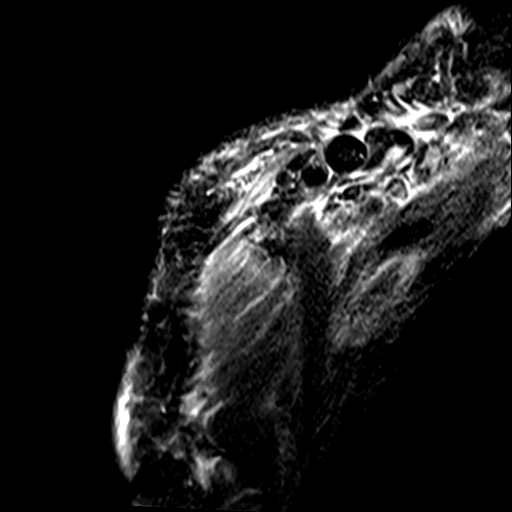
[im 8/24]
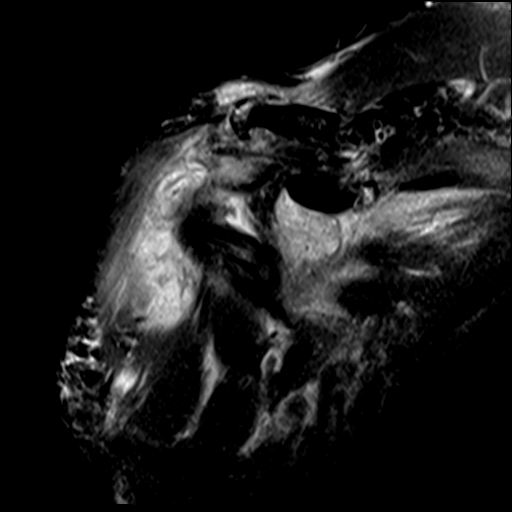
[im 16/24]
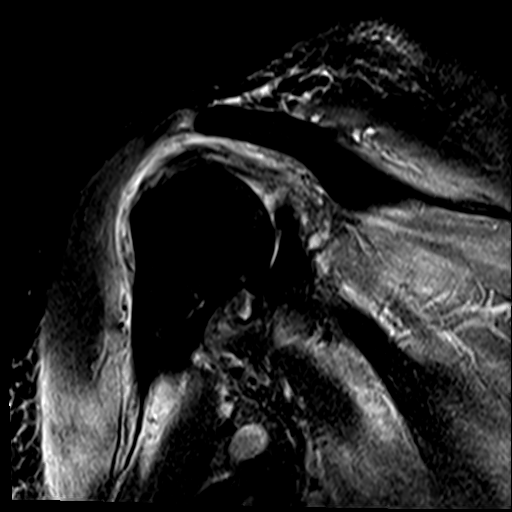
[im 24/24]
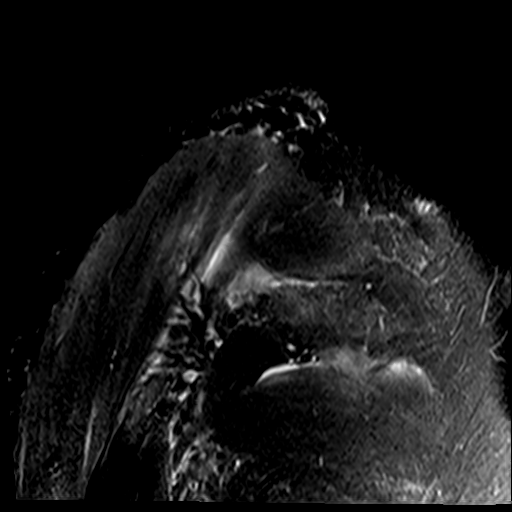

[Series 33: T2 fat-sat · axial · right · 4.0mm · 0.33mm/px · z∈[-101,+15]mm · 5 of 30 slices shown (3 of 4)]
[im 1/30]
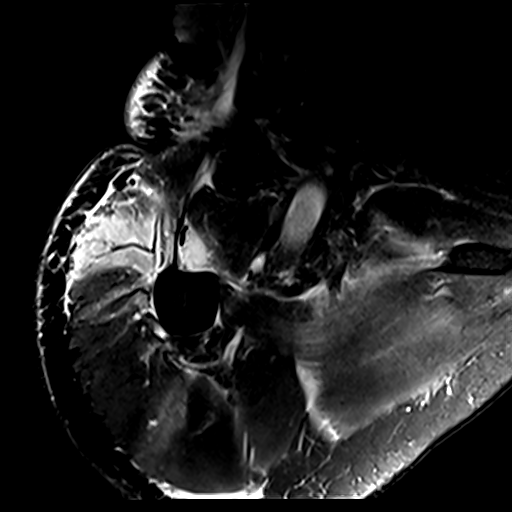
[im 8/30]
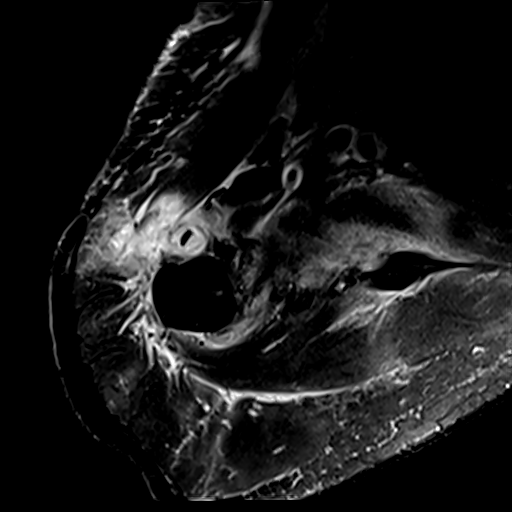
[im 15/30]
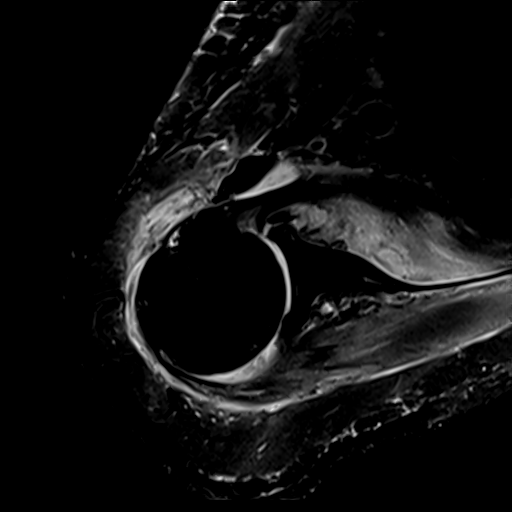
[im 22/30]
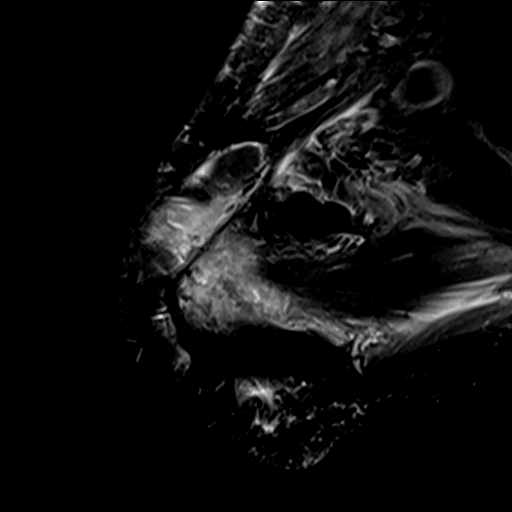
[im 30/30]
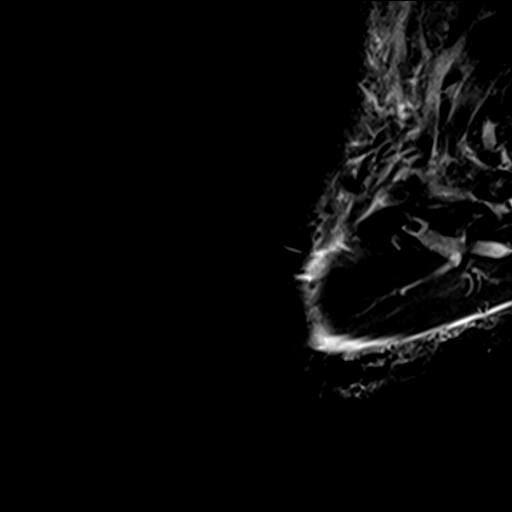

[Series 34: PD · coronal · right · 4.0mm · 0.33mm/px · 4 of 24 slices shown]
[im 1/24]
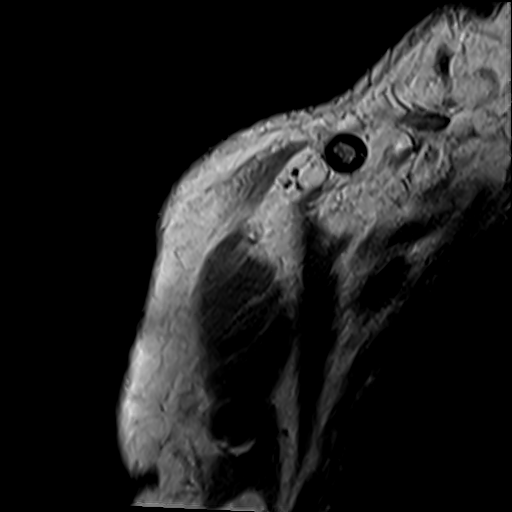
[im 8/24]
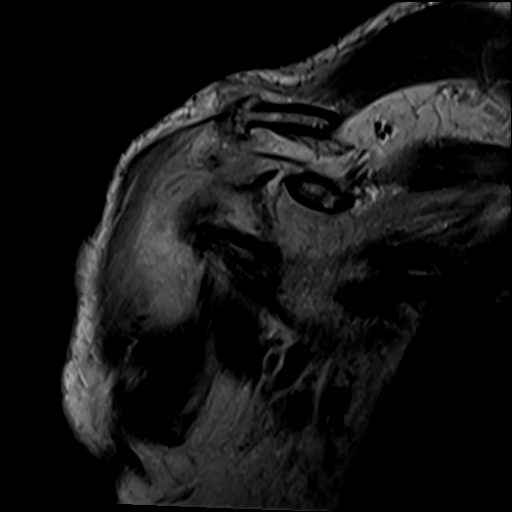
[im 16/24]
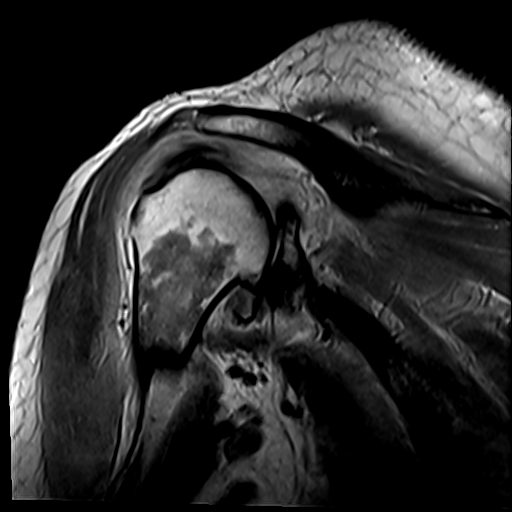
[im 24/24]
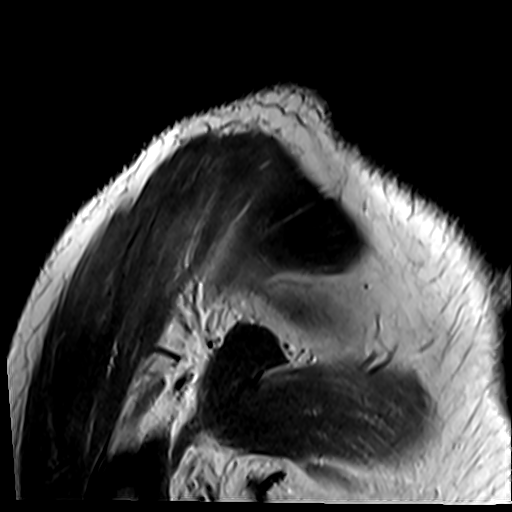

[Series 36: T2 fat-sat · sagittal · right · 3.5mm · 0.29mm/px · 4 of 29 slices shown (4 of 4)]
[im 1/29]
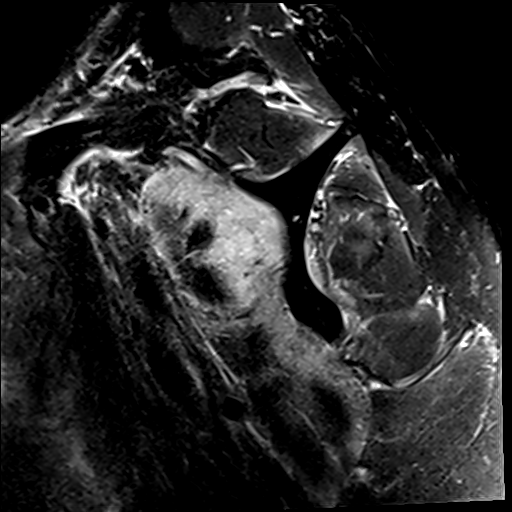
[im 8/29]
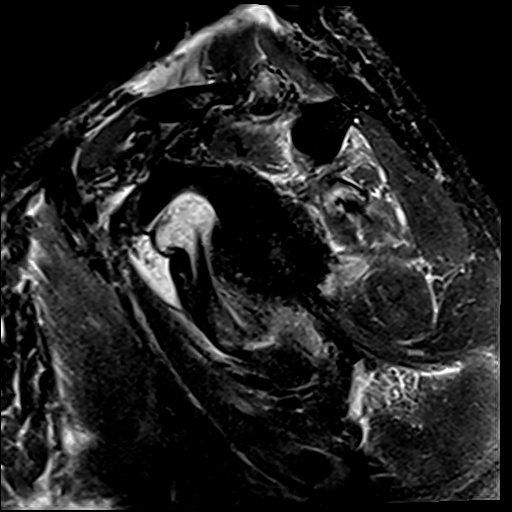
[im 15/29]
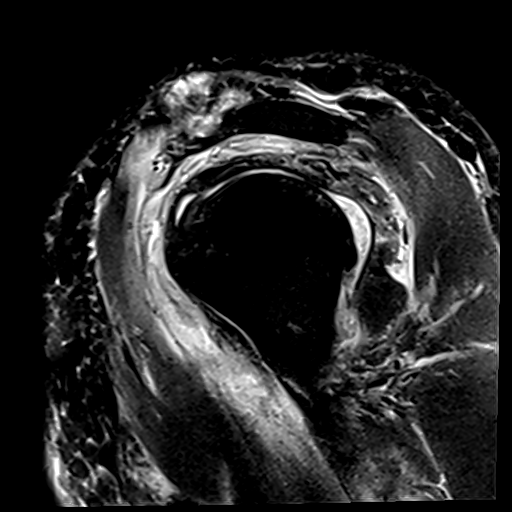
[im 22/29]
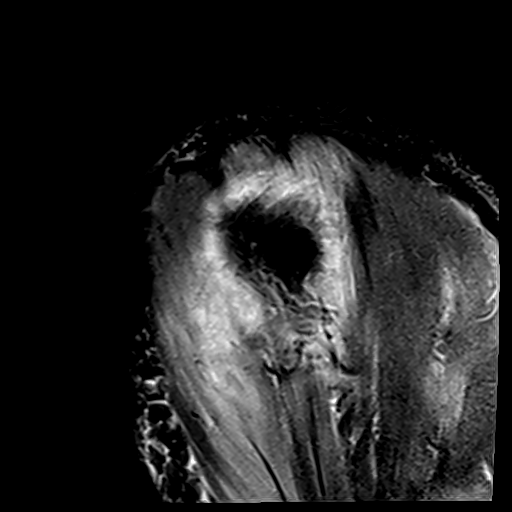

[20 of 40 positions shown; findings below may reference images not displayed]

FINDINGS: Examination is moderately limited due to motion artifact.

Rotator cuff: Significant rotator cuff tendinopathy/tendinosis which
could be septic in nature. No full-thickness retracted rotator cuff
tear.

Muscles: Severe myositis and pyomyositis involving the subscapularis
muscle. There is also moderate myositis involving the infraspinatus
muscle.

Biceps long head: Intact. Moderate tendinopathy involving the
intra-articular portion.

Acromioclavicular Joint: Significant inflammation/edema surrounding
the AC joint suspicious for septic arthritis.

Glenohumeral Joint: Moderate-sized joint effusion with synovial
enhancement worrisome for septic arthritis. I do not see any
definite findings for osteomyelitis involving the humeral head or
acetabulum.

Labrum:  Labral degenerative changes without definite tear.

Bones: Findings suspicious for osteomyelitis involving the distal
clavicle.

Other: Extensive complex fluid with subsequent rim like enhancement
involving the subacromial/subdeltoid bursa highly suspicious for
septic bursitis. Findings also suspicious for septic tenosynovitis
involving the long head biceps tendon and pyomyositis involving the
anterior deltoid muscle and possibly also part of the pectoralis
muscle.
IMPRESSION: 1. Findings suspicious for septic arthritis involving the AC joint
and glenohumeral joint.
2. Severe myositis and pyomyositis involving the subscapularis
muscle, the anterior deltoid muscle and the distal aspect of the
pectoralis muscle. There is also myositis involving the
infraspinatus muscle.
3. Extensive septic subacromial/subdeltoid bursitis.
4. Findings suspicious for septic tenosynovitis involving the long
head biceps tendon.
5. Significant rotator cuff tendinopathy/tendinosis which could be
septic in nature. No full-thickness retracted rotator cuff tear.

These results will be called to the ordering clinician or
representative by the Radiologist Assistant, and communication
documented in the PACS or [REDACTED].

## 2021-11-27 IMAGING — MR MR ANKLE*L* WO/W CM
6 of 7 series · 34 of 40 positions shown · IV contrast (gadavist)
Comparison: None.

CLINICAL DATA: Septic arthritis suspected.

EXAM:
MRI OF THE LEFT ANKLE WITHOUT AND WITH CONTRAST
TECHNIQUE: Multiplanar, multisequence MR imaging of the ankle was performed
before and after the administration of intravenous contrast.
CONTRAST:  9mL GADAVIST GADOBUTROL 1 MMOL/ML IV SOLN

[Series 3: T2 fat-sat · axial · left · 4.0mm · 0.42mm/px · z∈[-122,+41]mm · 7 of 34 slices shown (1 of 2)]
[im 1/34]
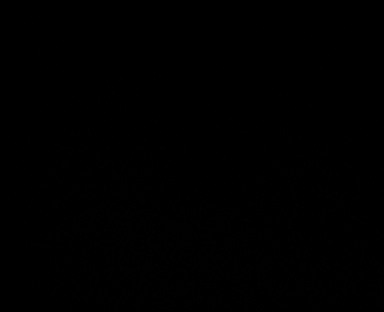
[im 6/34]
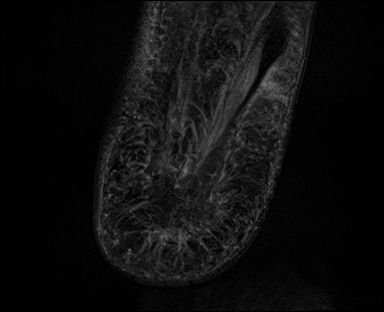
[im 12/34]
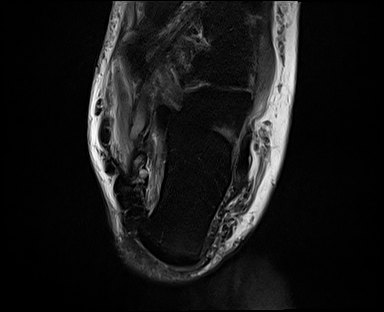
[im 17/34]
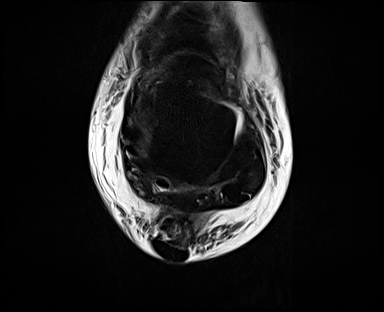
[im 23/34]
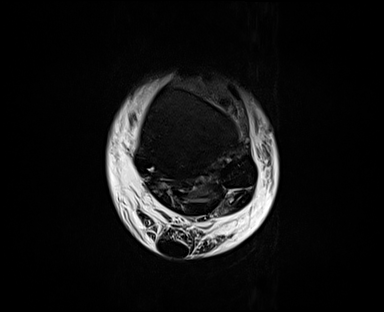
[im 28/34]
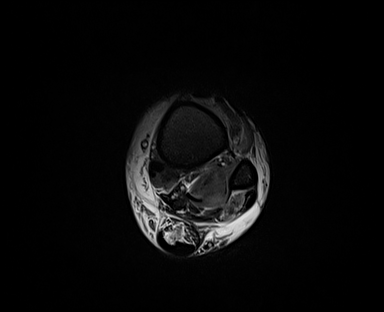
[im 34/34]
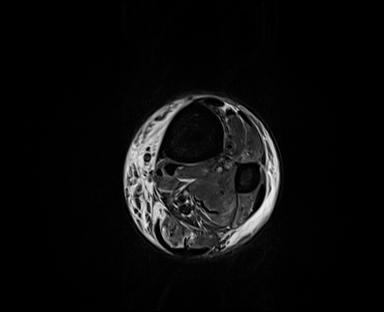

[Series 4: PD fat-sat · axial · left · 4.0mm · 0.50mm/px · z∈[-122,+41]mm · 7 of 34 slices shown]
[im 1/34]
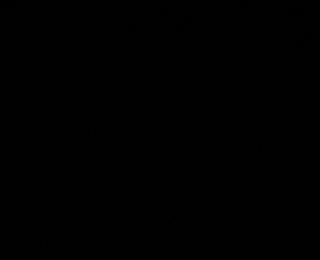
[im 6/34]
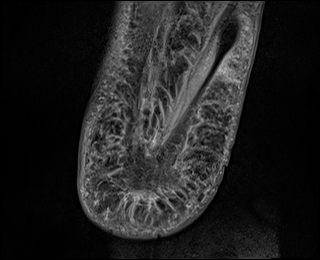
[im 12/34]
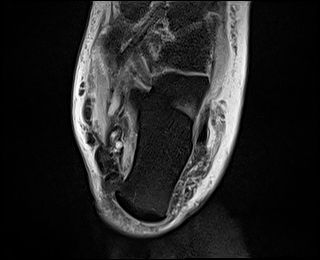
[im 17/34]
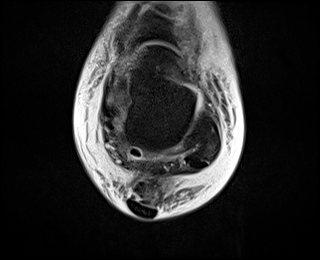
[im 23/34]
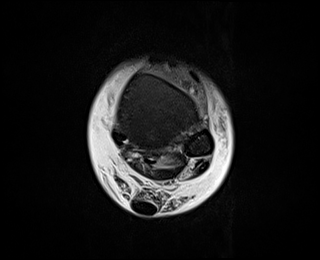
[im 28/34]
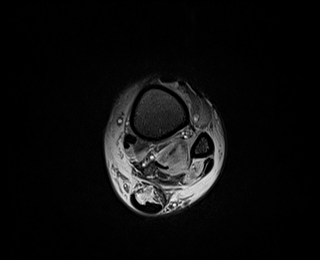
[im 34/34]
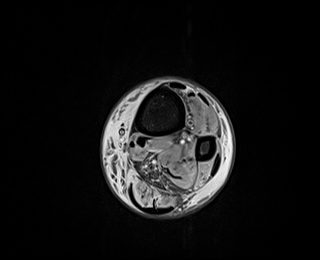

[Series 5: T2 fat-sat · coronal · left · 3.3mm · 0.50mm/px · 6 of 34 slices shown (2 of 2)]
[im 1/34]
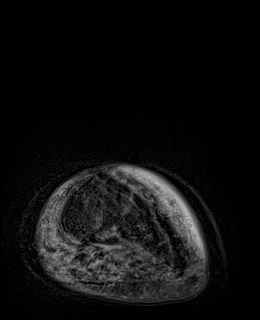
[im 7/34]
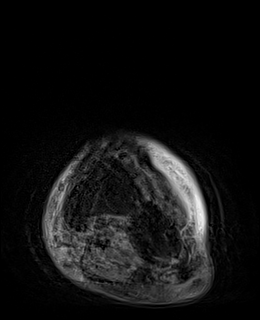
[im 14/34]
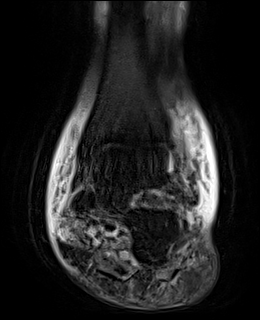
[im 20/34]
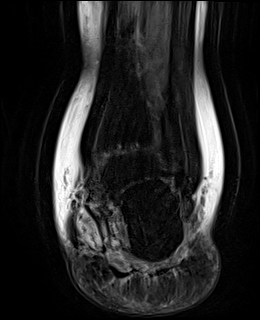
[im 27/34]
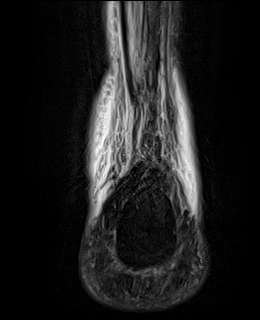
[im 34/34]
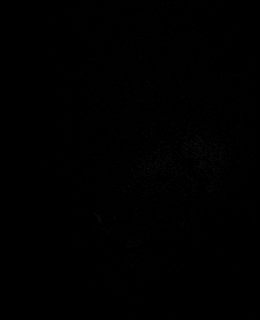

[Series 6: T1 · sagittal · left · 3.0mm · 0.50mm/px · 4 of 24 slices shown]
[im 1/24]
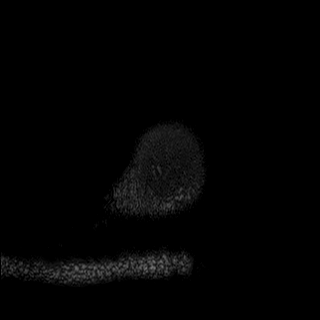
[im 8/24]
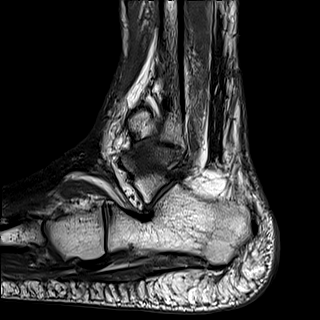
[im 16/24]
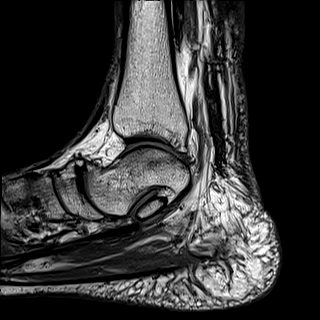
[im 24/24]
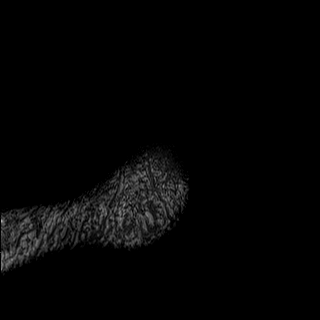

[Series 9: T1 fat-sat post-contrast · axial · left · 4.0mm · 0.31mm/px · z∈[-118,+45]mm · 6 of 34 slices shown (1 of 2)]
[im 1/34]
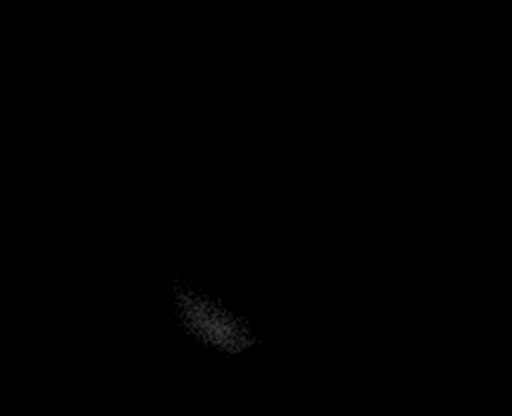
[im 7/34]
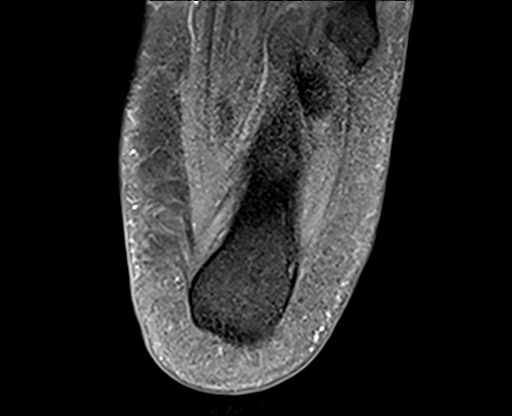
[im 14/34]
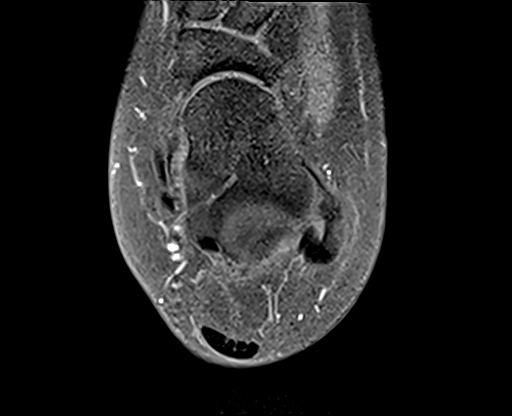
[im 20/34]
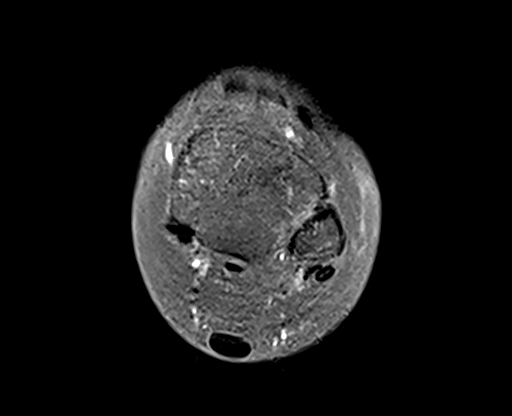
[im 27/34]
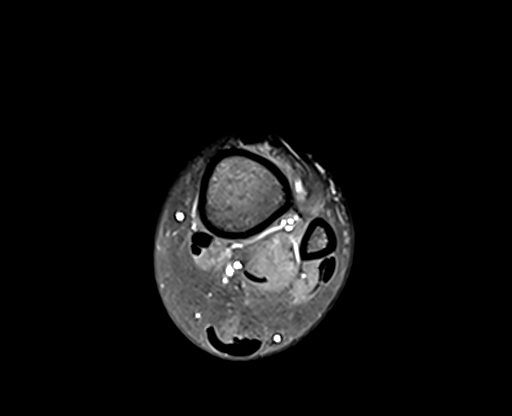
[im 34/34]
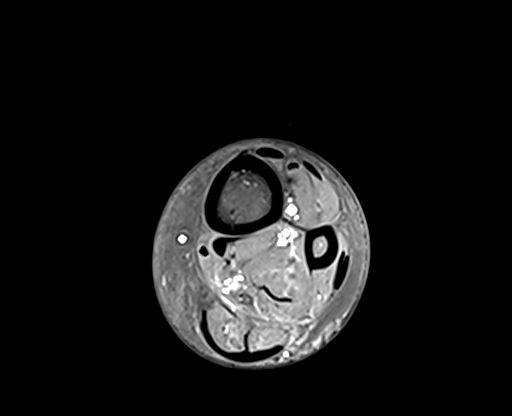

[Series 10: T1 fat-sat post-contrast · coronal · left · 3.0mm · 0.31mm/px · 4 of 34 slices shown (2 of 2)]
[im 1/34]
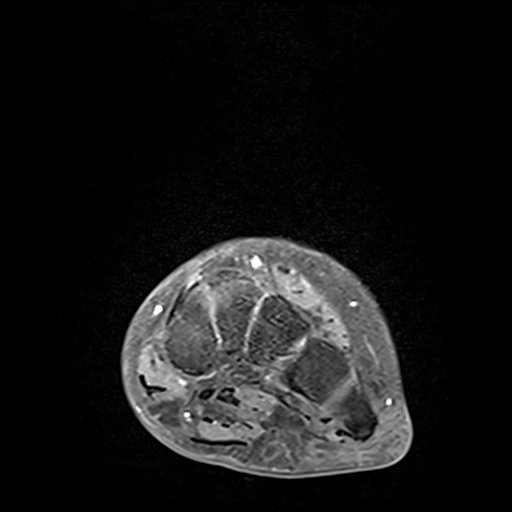
[im 7/34]
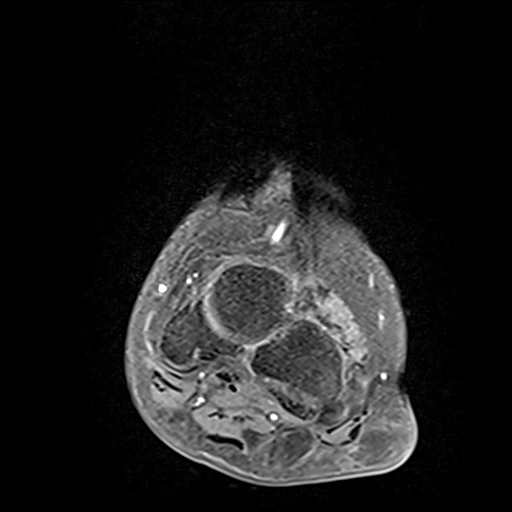
[im 14/34]
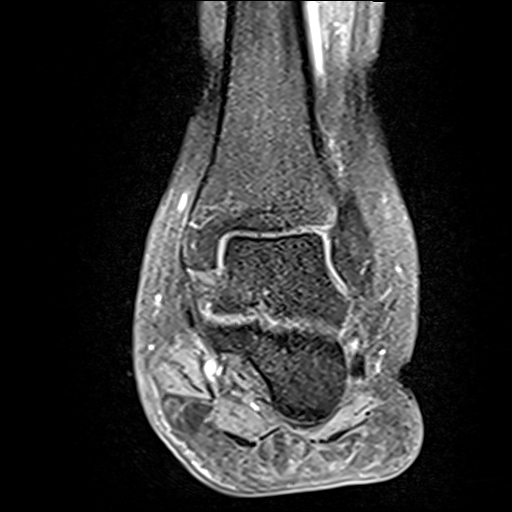
[im 20/34]
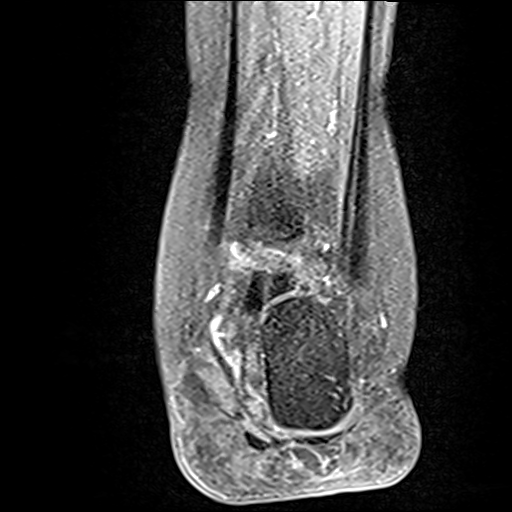

[34 of 40 positions shown; findings below may reference images not displayed]

FINDINGS: Despite efforts by the technologist and patient, mild motion
artifact is present on today's exam and could not be eliminated.
This reduces exam sensitivity and specificity. The patient was
unable to complete the examination, although most of the usual
sequences were obtained.

TENDONS

Peroneal: Intact and normally positioned. No tenosynovitis or
abnormal enhancement.

Posteromedial: Intact and normally positioned. No tenosynovitis or
abnormal enhancement.

Anterior: Intact and normally positioned. No tenosynovitis or
abnormal enhancement.

Achilles: Intact.

Plantar Fascia: Intact.

LIGAMENTS

Lateral: The anterior and posterior talofibular and calcaneofibular
ligaments are intact.The inferior tibiofibular ligaments appear
intact.

Medial: The deltoid and visualized portions of the spring ligament
appear intact.

CARTILAGE AND BONES

Ankle Joint: No significant ankle joint effusion or abnormal
synovial enhancement. Mild subchondral cyst formation posteriorly in
the tibial plafond appears degenerative. There is no subchondral
edema, erosive disease or abnormal osseous enhancement.

Subtalar Joints/Sinus Tarsi: Unremarkable. No subchondral edema or
abnormal enhancement.

Bones: Mild talonavicular degenerative changes. No acute osseous
findings. No evidence of osteomyelitis.

Other: Generalized moderate subcutaneous edema within the visualized
lower leg with extension into the dorsal aspect of the midfoot. No
focal fluid collections are identified. No evidence of foreign body.
IMPRESSION: IMPRESSION
1. Generalized soft tissue edema suspicious for soft tissue
infection (cellulitis) in this context. No focal fluid collection
identified.
2. No evidence of septic arthritis or osteomyelitis.
3. The ankle tendons and ligaments appear intact.

## 2021-11-27 MED ORDER — GADOBUTROL 1 MMOL/ML IV SOLN
9.0000 mL | Freq: Once | INTRAVENOUS | Status: AC | PRN
  Administered 2021-11-27: 9 mL via INTRAVENOUS

## 2021-11-27 MED ORDER — HEPARIN BOLUS VIA INFUSION
2500.0000 [IU] | Freq: Once | INTRAVENOUS | Status: AC
  Administered 2021-11-27: 2500 [IU] via INTRAVENOUS
  Filled 2021-11-27: qty 2500

## 2021-11-27 MED ORDER — APIXABAN 5 MG PO TABS
10.0000 mg | ORAL_TABLET | Freq: Two times a day (BID) | ORAL | Status: DC
  Administered 2021-11-27 – 2021-12-03 (×13): 10 mg via ORAL
  Filled 2021-11-27 (×13): qty 2

## 2021-11-27 MED ORDER — POLYETHYLENE GLYCOL 3350 17 G PO PACK
17.0000 g | PACK | Freq: Two times a day (BID) | ORAL | Status: DC
  Administered 2021-11-27 – 2021-12-14 (×14): 17 g via ORAL
  Filled 2021-11-27 (×33): qty 1

## 2021-11-27 MED ORDER — PENICILLIN G POTASSIUM 20000000 UNITS IJ SOLR
12.0000 10*6.[IU] | Freq: Two times a day (BID) | INTRAVENOUS | Status: DC
  Administered 2021-11-27 – 2021-12-18 (×35): 12 10*6.[IU] via INTRAVENOUS
  Filled 2021-11-27 (×29): qty 12
  Filled 2021-11-27: qty 5
  Filled 2021-11-27 (×17): qty 12

## 2021-11-27 MED ORDER — APIXABAN 5 MG PO TABS: 5.0000 mg | ORAL_TABLET | Freq: Two times a day (BID) | ORAL | Status: DC

## 2021-11-27 MED ORDER — BISACODYL 10 MG RE SUPP
10.0000 mg | Freq: Once | RECTAL | Status: AC
  Administered 2021-11-27: 10 mg via RECTAL
  Filled 2021-11-27: qty 1

## 2021-11-27 MED ORDER — BISACODYL 10 MG RE SUPP: 10.0000 mg | Freq: Every day | RECTAL | Status: DC | PRN

## 2021-11-27 MED ORDER — OXYCODONE HCL 5 MG PO TABS
5.0000 mg | ORAL_TABLET | ORAL | Status: DC | PRN
  Administered 2021-11-27 – 2021-12-04 (×14): 10 mg via ORAL
  Administered 2021-12-04 – 2021-12-05 (×3): 5 mg via ORAL
  Administered 2021-12-07 – 2021-12-11 (×8): 10 mg via ORAL
  Filled 2021-11-27 (×4): qty 2
  Filled 2021-11-27: qty 1
  Filled 2021-11-27 (×5): qty 2
  Filled 2021-11-27: qty 1
  Filled 2021-11-27 (×13): qty 2
  Filled 2021-11-27: qty 1
  Filled 2021-11-27 (×3): qty 2
  Filled 2021-11-27: qty 1

## 2021-11-27 MED ORDER — SENNOSIDES-DOCUSATE SODIUM 8.6-50 MG PO TABS
2.0000 | ORAL_TABLET | Freq: Every day | ORAL | Status: DC
  Administered 2021-11-27 – 2021-12-16 (×10): 2 via ORAL
  Filled 2021-11-27 (×18): qty 2

## 2021-11-27 NOTE — TOC Benefit Eligibility Note (Signed)
Transition of Care (TOC) Benefit Eligibility Note  ? ? ?Patient Details  ?Name: [name] ?MRN: [id number] ?Date of Birth: 01/25/1956 ? ? ?Medication/Dose: Eliquis 10 Mg twice daily x1 week followed by 5 Mg twice daily thereafter     Xarelto: 15 mg twice daily x3 weeks and then 20 Mg daily thereafter ? ?Covered?: Yes ? ?  ? ?Prescription Coverage Preferred Pharmacy: local ? ?Spoke with Person/Company/Phone Number:: [employer] [phone] ? ?Co-Pay: Eliquis is $47.00 and Xarelto is $99.06 ? ?Prior Approval: No ? ?Deductible: Met ? ?  ? ? ? ?Fuller, Malinda ?Phone Number: ?11/27/2021, 11:11 AM ? ? ? ? ?

## 2021-11-27 NOTE — Progress Notes (Signed)
ANTICOAGULATION CONSULT NOTE - follow up ? ?Pharmacy Consult for Heparin > Apixaban ?Indication: DVT ? ?Allergies  ?Allergen Reactions  ? Codeine Itching  ? Glipizide Er [Glipizide]   ?  SOB, malaise. He tolerates XL without issues.  ? Metformin And Related Rash  ? ? ?Patient Measurements: ?Height: 5\' 4"  (162.6 cm) ?Weight: 85.3 kg (188 lb 0.8 oz) ?IBW/kg (Calculated) : 59.2 ?Heparin Dosing Weight:  77 kg ? ?Vital Signs: ?Temp: 98 ?F (36.7 ?C) (04/10 [id number]) ?BP: 129/78 (04/10 [id number]) ?Pulse Rate: 103 (04/10 0512) ? ?Labs: ?Recent Labs  ?  11/25/21 ?[id number] 11/26/21 ?0358 11/27/21 ?0240  ?HGB 13.9 14.9 14.9  ?HCT 40.1 43.6 41.8  ?PLT 100* 127* 155  ?HEPARINUNFRC  --   --  <0.10*  ?CREATININE 0.65 0.70 0.59*  ? ? ? ?Estimated Creatinine Clearance: 90.6 mL/min (A) (by C-G formula based on SCr of 0.59 mg/dL (L)). ? ? ?Medical History: ?Past Medical History:  ?Diagnosis Date  ? Diabetes mellitus without complication ([place])   ? ? ?Assessment: ?AC/Heme: ?D-dimer 5.37.  CTa chest negative for PE. + new L DVT. Thrombocytopenia appears chronic ? ?11/27/2021 ?HL < 0.1 subtherapeutic on 1250 units/hr ?CBC: H/H improved, Plts improved 155 ?No bleeding or interruptions per RN ? ? ?Goal of Therapy:  ?Heparin level 0.3-0.7 units/ml ?Monitor platelets by anticoagulation protocol: Yes ?  ?Plan:  ?DC Heparin ?Start apixaban 10mg  PO bid x 7 days, then 5mg  BID thereafter ?Will provide 30 day free coupon voucher prior to discharge ? ?Peggyann Juba, PharmD, BCPS ?Pharmacy: [phone] ?11/27/2021, 8:04 AM ? ? ?

## 2021-11-27 NOTE — Plan of Care (Signed)
  Problem: Nutrition: Goal: Adequate nutrition will be maintained Outcome: Progressing   

## 2021-11-27 NOTE — Progress Notes (Signed)
ANTICOAGULATION CONSULT NOTE - follow up ? ?Pharmacy Consult for Heparin ?Indication: DVT ? ?Allergies  ?Allergen Reactions  ? Codeine Itching  ? Glipizide Er [Glipizide]   ?  SOB, malaise. He tolerates XL without issues.  ? Metformin And Related Rash  ? ? ?Patient Measurements: ?Height: 5\' 4"  (162.6 cm) ?Weight: 84.1 kg (185 lb 6.5 oz) (weighed 3 times on standing scale) ?IBW/kg (Calculated) : 59.2 ?Heparin Dosing Weight:  77 kg ? ?Vital Signs: ?Temp: 98.8 ?F (37.1 ?C) (04/10 0052) ?BP: 127/79 (04/10 0052) ?Pulse Rate: 98 (04/10 0052) ? ?Labs: ?Recent Labs  ?  11/25/21 ?[id number] 11/26/21 ?0358 11/27/21 ?0240  ?HGB 13.9 14.9 14.9  ?HCT 40.1 43.6 41.8  ?PLT 100* 127* 155  ?HEPARINUNFRC  --   --  <0.10*  ?CREATININE 0.65 0.70 0.59*  ? ? ? ?Estimated Creatinine Clearance: 90.1 mL/min (A) (by C-G formula based on SCr of 0.59 mg/dL (L)). ? ? ?Medical History: ?Past Medical History:  ?Diagnosis Date  ? Diabetes mellitus without complication ([place])   ? ? ?Assessment: ?AC/Heme: ?D-dimer 5.37.  CTa chest negative for PE. + new L DVT. Thrombocytopenia appears chronic ? ?11/27/2021 ?HL < 0.1 subtherapeutic on 1250 units/hr ?CBC WNL ?No bleeding or interruptions per RN ? ? ?Goal of Therapy:  ?Heparin level 0.3-0.7 units/ml ?Monitor platelets by anticoagulation protocol: Yes ?  ?Plan:  ?Heparin 2500 unit IV bolus x 1 ?Increase Heparin infusion 1500 units/hr ?Check heparin level in 6 hr ?Daily HL and CBC ? ?Dolly Rias RPh ?11/27/2021, 3:26 AM ? ? ?

## 2021-11-27 NOTE — Progress Notes (Signed)
OT Cancellation Note ? ?Patient Details ?Name: David Strong ?MRN: [id number] ?DOB: [birth year] ? ? ?Cancelled Treatment:    Reason Eval/Treat Not Completed: Patient not medically ready ?Patient initiated Heparin on 4/9 for PE and L DVT. OT to hold until patient's Heparin level is in therapeutic range and out of the 24 hour window of initiating heparin.  ?Joselyn Edling OTR/L, MS ?Acute Rehabilitation Department ?Office# [phone] ?Pager# [phone] ? ?Barnabas Lister Pakou Rainbow ?11/27/2021, 7:01 AM ?

## 2021-11-27 NOTE — Assessment & Plan Note (Addendum)
Aggressive bowel regimen. ?BMs documented. ?

## 2021-11-27 NOTE — Assessment & Plan Note (Addendum)
Right upper extremity: SVT.  Supportive treatment.  Would avoid NSAIDs given starting anticoagulation below. ?Left lower extremity: Age-indeterminate DVT left popliteal vein, acute DVT left gastrocnemius and left posterior tibial veins. ?Briefly on IV heparin, transitioned to Eliquis. ?Due to complaint of bilateral leg pain and swelling, obtained RLE venous Dopplers which were negative for DVT or SVT. ?

## 2021-11-27 NOTE — Progress Notes (Signed)
PT Cancellation Note ? ?Patient Details ?Name: David Strong ?MRN: [id number] ?DOB: [birth year] ? ? ?Cancelled Treatment:    Reason Eval/Treat Not Completed: Patient declined. Pt stated he had been up to the bathroom and just got settled and politely declined PT.  Will check back as schedule permits. ? ? ?Enzo Montgomery ?11/27/2021, 2:11 PM ?

## 2021-11-27 NOTE — Progress Notes (Signed)
? ? ? ? ? ? ?Subjective: ?Patient still has significant pain in his right shoulder unable to raise his arm much on that side at all. ? ?Also with tenderness in his left ankle ? ? ?Antibiotics:  ?Anti-infectives (From admission, onward)  ? ? Start     Dose/Rate Route Frequency Ordered Stop  ? 11/27/21 1000  penicillin G potassium 12 Million Units in dextrose 5 % 500 mL continuous infusion       ? 12 Million Units ?41.7 mL/hr over 12 Hours Intravenous Every 12 hours 11/27/21 0901    ? 11/23/21 2000  vancomycin (VANCOREADY) IVPB 1750 mg/350 mL  Status:  Discontinued       ? 1,750 mg ?175 mL/hr over 120 Minutes Intravenous Every 24 hours 11/22/21 1943 11/23/21 0931  ? 11/22/21 2300  cefTRIAXone (ROCEPHIN) 2 g in sodium chloride 0.9 % 100 mL IVPB  Status:  Discontinued       ? 2 g ?200 mL/hr over 30 Minutes Intravenous Every 24 hours 11/22/21 1923 11/27/21 0901  ? 11/22/21 2000  azithromycin (ZITHROMAX) 500 mg in sodium chloride 0.9 % 250 mL IVPB  Status:  Discontinued       ? 500 mg ?250 mL/hr over 60 Minutes Intravenous Every 24 hours 11/22/21 1923 11/23/21 0931  ? 11/22/21 1830  ceFEPIme (MAXIPIME) 2 g in sodium chloride 0.9 % 100 mL IVPB       ? 2 g ?200 mL/hr over 30 Minutes Intravenous  Once 11/22/21 1825 11/22/21 1949  ? 11/22/21 1830  vancomycin (VANCOREADY) IVPB 1750 mg/350 mL       ? 1,750 mg ?175 mL/hr over 120 Minutes Intravenous  Once 11/22/21 1825 11/22/21 2147  ? ?  ? ? ?Medications: ?Scheduled Meds: ? apixaban  10 mg Oral BID  ? Followed by  ? [START ON 12/04/2021] apixaban  5 mg Oral BID  ? insulin aspart  0-15 Units Subcutaneous TID WC  ? insulin aspart  0-5 Units Subcutaneous QHS  ? insulin aspart  5 Units Subcutaneous TID WC  ? insulin glargine-yfgn  18 Units Subcutaneous Daily  ? lidocaine  1 patch Transdermal Q24H  ? polyethylene glycol  17 g Oral BID  ? Ensure Max Protein  11 oz Oral BID  ? senna-docusate  2 tablet Oral Daily  ? ?Continuous Infusions: ? penicillin g continuous IV infusion 12  Million Units (11/27/21 0936)  ? ?PRN Meds:.acetaminophen **OR** [DISCONTINUED] acetaminophen, benzonatate, bisacodyl, naLOXone (NARCAN)  injection, oxyCODONE ? ? ? ?Objective: ?Weight change: 1.2 kg ? ?Intake/Output Summary (Last 24 hours) at 11/27/2021 1527 ?Last data filed at 11/27/2021 1400 ?Gross per 24 hour  ?Intake 1085.09 ml  ?Output 950 ml  ?Net 135.09 ml  ? ?Blood pressure 137/82, pulse 100, temperature 98.6 ?F (37 ?C), temperature source Oral, resp. rate 14, height 5\' 4"  (1.626 m), weight 85.3 kg, SpO2 98 %. ?Temp:  [98 ?F (36.7 ?C)-99.1 ?F (37.3 ?C)] 98.6 ?F (37 ?C) (04/10 1334) ?Pulse Rate:  [97-103] 100 (04/10 1334) ?Resp:  [14-16] 14 (04/10 1334) ?BP: (120-149)/(73-84) 137/82 (04/10 1334) ?SpO2:  [97 %-98 %] 98 % (04/10 1334) ?Weight:  [85.3 kg] 85.3 kg (04/10 0512) ? ?Physical Exam: ?Physical Exam ?Constitutional:   ?   Appearance: He is well-developed. He is obese.  ?HENT:  ?   Head: Normocephalic and atraumatic.  ?Eyes:  ?   Conjunctiva/sclera: Conjunctivae normal.  ?Cardiovascular:  ?   Rate and Rhythm: Normal rate and regular rhythm.  ?Pulmonary:  ?  Effort: Pulmonary effort is normal. No respiratory distress.  ?   Breath sounds: No wheezing.  ?Chest:  ?   Chest wall: Mass and swelling present.  ?Abdominal:  ?   General: There is no distension.  ?   Palpations: Abdomen is soft.  ?Musculoskeletal:  ?   Right shoulder: Swelling and tenderness present. Decreased range of motion.  ?   Right upper arm: Swelling and edema present.  ?   Cervical back: Normal range of motion and neck supple.  ?   Left ankle: Tenderness present.  ?Skin: ?   General: Skin is warm and dry.  ?   Findings: No erythema or rash.  ?Neurological:  ?   General: No focal deficit present.  ?   Mental Status: He is alert and oriented to person, place, and time.  ?Psychiatric:     ?   Mood and Affect: Mood normal.     ?   Behavior: Behavior normal.     ?   Thought Content: Thought content normal.     ?   Judgment: Judgment normal.  ?   ? ?CBC: ? ? ? ?BMET ?Recent Labs  ?  11/26/21 ?0358 11/27/21 ?0240  ?NA 133* 133*  ?K 4.0 4.1  ?CL 98 98  ?CO2 24 27  ?GLUCOSE 159* 212*  ?BUN 28* 18  ?CREATININE 0.70 0.59*  ?CALCIUM 8.0* 7.8*  ? ? ? ?Liver Panel ? ?Recent Labs  ?  11/26/21 ?0358 11/27/21 ?0240  ?PROT 6.0* 5.7*  ?ALBUMIN 2.2* 2.0*  ?AST 134* 125*  ?ALT 79* 84*  ?ALKPHOS 342* 349*  ?BILITOT 1.7* 1.4*  ? ? ? ? ? ?Sedimentation Rate ?No results for input(s): ESRSEDRATE in the last 72 hours. ?C-Reactive Protein ?No results for input(s): CRP in the last 72 hours. ? ?Micro Results: ?Recent Results (from the past 720 hour(s))  ?Blood culture (routine x 2)     Status: Abnormal  ? Collection Time: 11/22/21  4:10 PM  ? Specimen: BLOOD  ?Result Value Ref Range Status  ? Specimen Description   Final  ?  BLOOD BLOOD LEFT FOREARM ?Performed at [facility], [place] [address], [place], [place] [address] ?  ? Special Requests   Final  ?  BOTTLES DRAWN AEROBIC AND ANAEROBIC Blood Culture results may not be optimal due to an inadequate volume of blood received in culture bottles ?Performed at [facility], [place] [address], [place], [place] [address] ?  ? Culture  Setup Time   Final  ?  GRAM POSITIVE COCCI ?IN BOTH AEROBIC AND ANAEROBIC BOTTLES ?Organism ID to follow ?CRITICAL RESULT CALLED TO, READ BACK BY AND VERIFIED WITH: N. [place], AT [phone] 11/23/21 D. VANHOOK ?Performed at [facility], [place] [address] [place], [place] [address] ?  ? Culture STREPTOCOCCUS PNEUMONIAE (A)  Final  ? Report Status 11/25/2021 FINAL  Final  ? Organism ID, Bacteria STREPTOCOCCUS PNEUMONIAE  Final  ?    Susceptibility  ? Streptococcus pneumoniae - MIC*  ?  ERYTHROMYCIN >=8 RESISTANT Resistant   ?  LEVOFLOXACIN 0.5 SENSITIVE Sensitive   ?  VANCOMYCIN 0.5 SENSITIVE Sensitive   ?  PENICILLIN (meningitis) <=0.06 SENSITIVE Sensitive   ?  PENO - penicillin <=0.06    ?  PENICILLIN (non-meningitis) <=0.06 SENSITIVE Sensitive   ?  PENICILLIN (oral)  <=0.06 SENSITIVE Sensitive   ?  CEFTRIAXONE (non-meningitis) <=0.12 SENSITIVE Sensitive   ?  CEFTRIAXONE (meningitis) <=0.12 SENSITIVE Sensitive   ?  *  STREPTOCOCCUS PNEUMONIAE  ?Blood Culture ID Panel (Reflexed)     Status: Abnormal  ? Collection Time: 11/22/21  4:10 PM  ?Result Value Ref Range Status  ? Enterococcus faecalis NOT DETECTED NOT DETECTED Final  ? Enterococcus Faecium NOT DETECTED NOT DETECTED Final  ? Listeria monocytogenes NOT DETECTED NOT DETECTED Final  ? Staphylococcus species NOT DETECTED NOT DETECTED Final  ? Staphylococcus aureus (BCID) NOT DETECTED NOT DETECTED Final  ? Staphylococcus epidermidis NOT DETECTED NOT DETECTED Final  ? Staphylococcus lugdunensis NOT DETECTED NOT DETECTED Final  ? Streptococcus species DETECTED (A) NOT DETECTED Final  ?  Comment: CRITICAL RESULT CALLED TO, READ BACK BY AND VERIFIED WITH: Guadlupe Spanish PHARMD, AT [phone] 11/23/21 D. VANHOOK ?  ? Streptococcus agalactiae NOT DETECTED NOT DETECTED Final  ? Streptococcus pneumoniae DETECTED (A) NOT DETECTED Final  ?  Comment: CRITICAL RESULT CALLED TO, READ BACK BY AND VERIFIED WITH: ?N. [place], AT [phone] 11/23/21 D. VANHOOK ?  ? Streptococcus pyogenes NOT DETECTED NOT DETECTED Final  ? A.calcoaceticus-baumannii NOT DETECTED NOT DETECTED Final  ? Bacteroides fragilis NOT DETECTED NOT DETECTED Final  ? Enterobacterales NOT DETECTED NOT DETECTED Final  ? Enterobacter cloacae complex NOT DETECTED NOT DETECTED Final  ? Escherichia coli NOT DETECTED NOT DETECTED Final  ? Klebsiella aerogenes NOT DETECTED NOT DETECTED Final  ? Klebsiella oxytoca NOT DETECTED NOT DETECTED Final  ? Klebsiella pneumoniae NOT DETECTED NOT DETECTED Final  ? Proteus species NOT DETECTED NOT DETECTED Final  ? Salmonella species NOT DETECTED NOT DETECTED Final  ? Serratia marcescens NOT DETECTED NOT DETECTED Final  ? Haemophilus influenzae NOT DETECTED NOT DETECTED Final  ? Neisseria meningitidis NOT DETECTED NOT DETECTED Final  ? Pseudomonas  aeruginosa NOT DETECTED NOT DETECTED Final  ? Stenotrophomonas maltophilia NOT DETECTED NOT DETECTED Final  ? Candida albicans NOT DETECTED NOT DETECTED Final  ? Candida auris NOT DETECTED NOT DETECTED Final  ? Can

## 2021-11-27 NOTE — Plan of Care (Signed)
  Problem: Activity: Goal: Risk for activity intolerance will decrease Outcome: Progressing   Problem: Pain Managment: Goal: General experience of comfort will improve Outcome: Progressing   Problem: Safety: Goal: Ability to remain free from injury will improve Outcome: Progressing   

## 2021-11-27 NOTE — Progress Notes (Signed)
?PROGRESS NOTE ?  ?David Strong  PTW:[id number]    DOB: [birth year]    DOA: 11/22/2021 ? ?PCP: David Pollen, MD  ? ?I have briefly reviewed patients previous medical records in [facility]. ? ?Chief Complaint  ?Patient presents with  ? Muscle Pain  ? ? ?Hospital Course:  ?66 year old male with medical history significant for type II DM, presented to the ED with a couple days history of progressive productive cough, subjective fevers, chills, myalgia, severe arthralgia and new onset erythema and swelling on the left side of the base of the neck.  CTA showed no PE, confirmed RML and bibasilar airspace opacities consistent with multifocal pneumonia and also left sternoclavicular joint inflammatory changes with scattered foci of air within the soft tissues and sternoclavicular joint space compatible with localized left sternoclavicular septic arthritis/cellulitis, without evidence of drainable fluid collection or large joint effusion.  Subsequent BCID and urine antigen positive for strep.  Antibiotics narrowed to IV ceftriaxone.  Admitted for severe sepsis, POA due to streptococcal pneumonia with bacteremia, left sternoclavicular joint septic arthritis and other large joint arthralgia/arthritis (right shoulder, left knee).  Clinically improving.  Biggest issue is now arthralgia of right shoulder with painful elevation.  Newly diagnosed right upper arm SVT and LLE DVT, briefly on IV heparin, transitioning to Eliquis. ? ?Assessment and Plan: ?* Severe sepsis ([place]) ?Due to streptococcal multifocal pneumonia with bacteremia, left sternoclavicular joint septic arthritis and other polyarthralgia/arthritis (right shoulder, left knee) ?Met sepsis criteria on admission including persistent tachypnea, tachycardia and did spike a fever of 100.6 overnight of admission.  CTA chest confirmed multifocal pneumonia.  Elevated lactate. ?Treated with normal saline bolus 1 L, followed by maintenance IV fluids. ?Empirically  started on IV vancomycin, ceftriaxone and azithromycin. ?BCID and urine positive for Streptococcus.  Antibiotics then narrowed to IV ceftriaxone.  Will need extended course of oral antibiotics at discharge due to septic arthritis.  ID to recommend choice and duration. ?Infectious disease consultation appreciated.  ?TTE: LVEF 60-65%.  No evidence of valvular vegetations.  Surveillance blood cultures 4/7: NTD ?Minimal fluid on ultrasound of left [place] joint, patient declined aspiration and IR also felt that there was not much to aspirate. ?Sepsis physiology has resolved but still with significant arthralgias/arthritis of above joints.  Still with significant pain and swelling of right shoulder area, difficult to raise against gravity, discontinued NSAIDs due to initiation of anticoagulation.  Adjusting pain meds. ?Discussed with ID, could possibly DC on approximately 2 weeks of oral antibiotics in a day or 2 pending further improvement in his arthralgia and mobility. ?Right shoulder x-ray with chronic and no acute findings.  Now with right upper arm SVT and left lower extremity DVT on anticoagulation. ?Continue IV antibiotics while hospitalized with plan to change to oral at time of discharge. ? ?Septic arthritis ([place]) ?Left sternoclavicular joint septic arthritis ?Has other polyarthralgias/arthritis (right shoulder and left knee) ?CTA chest report as noted above. ?On admission, case was discussed by EDP with on-call CT surgeon who felt that case and imaging were consistent with septic arthritis of the left sternoclavicular joint and recommended medical management and IV antibiotics without need for any surgical intervention. ?Per ultrasound, minimal fluid in left [place] joint, IR did not think would be high yield and moreover patient declined aspiration. ?Still has significant right shoulder area pain.  This may be multifactorial related to SVT and may be septic arthritis.?  MRI,?  IR aspiration but not sure if this will  change management.  Await  ID follow-up. ? ?CAP (community acquired pneumonia) ?Pneumococcal pneumonia ?Management as noted above. ?As per patient's report, patient's 59-year-old son who goes to daycare had runny nose a couple days prior to patient getting sick. ? ?DVT (deep venous thrombosis) ([place]) ?Right upper extremity: SVT.  Supportive treatment.  Would avoid NSAIDs given starting anticoagulation below. ?Left lower extremity: Age-indeterminate DVT left popliteal vein, acute DVT left gastrocnemius and left posterior tibial veins. ?Discussed in detail with patient yesterday and this morning regarding risks and benefits of anticoagulation, bleeding risks, choices and side effects of each including warfarin, Xarelto, Eliquis and Pradaxa.  He agrees to [employer]. ?Briefly on IV heparin, will transition to Eliquis. ? ?Transaminitis ?Mild and likely related to severe sepsis. ?No GI symptoms. ?Acetaminophen level 14, salicylate level less than 7 and acute hepatitis panel negative.  UDS positive only for opiates. ?LFTs are actually slowly creeping up.?  Related to antibiotics or sepsis itself. ?Essentially same as yesterday.  No GI symptoms. ?RUQ ultrasound with gallbladder sludge and no acute findings. ?Continue to trend daily CMP. ? ?High anion gap metabolic acidosis ?Lactic acidosis ?Secondary to severe sepsis. ?Presented with lactate of 2.3 which has improved to 2.1.  Bicarbonate normal.  Anion gap has normalized. ?Resolved. ? ?Thrombocytopenia ([place]) ?Suspect chronic or acute on chronic ?Platelet counts in 2018: 104 ?Resolved. ? ?Type 2 diabetes mellitus ([place]) ?A1c 8 on 10/12/2021. ?On glipizide and dapagliflozin at home.  Currently held. ?CBGs uncontrolled and fluctuating.  Increased Semglee to 18 units daily continue NovoLog SSI moderate sensitivity and increased NovoLog meal time 5 units 3 times daily ?Diabetes coordinator consulted ?Monitor CBGs closely and adjust insulins as needed. ?Labile but  acceptable. ? ?Hypokalemia ?Replaced.  Magnesium 2.8. ? ?Hypophosphatemia ?Continue to replace and follow in a.m. ? ?Constipation ?Aggressive bowel regimen. ? ? ?Body mass index is 32.28 kg/m?. ? ?DVT prophylaxis: SCDs Start: 11/22/21 1920   ?  Code Status: Full Code:  ?Family Communication: None at bedside ?Status is: Inpatient ?Remains inpatient appropriate because: Bacteremia, sepsis, pneumonia requiring IV antibiotics, close monitoring ?  ? ?Consultants:   ?Infectious disease ?IR ? ?Procedures:   ?None ? ?Antimicrobials:   ?IV ceftriaxone ? ? ?Subjective:  ?States that he did not sleep much last night.  Ongoing pain of right shoulder unable to lift against gravity.  Pain around the shoulder area and right upper back.  Feels that he has "gas in it".  Constipation and no BM for almost a week.  No nausea vomiting or abdominal pain.  Tolerated diet.  Left leg pain is better. ? ?Objective:  ? ?Vitals:  ? 11/26/21 1857 11/26/21 2054 11/27/21 7915 11/27/21 0569  ?BP: 127/73 120/81 127/79 129/78  ?Pulse: 98 98 98 (!) 103  ?Resp: _0 ?Temp: 99.1 ?F (37.3 ?C) 98.7 ?F (37.1 ?C) 98.8 ?F (37.1 ?C) 98 ?F (36.7 ?C)  ?TempSrc:      ?SpO2:  98% 98% 97%  ?Weight:    85.3 kg  ?Height:      ? ? ?General exam: Middle-age male, moderately built and nourished sitting up comfortably at edge of bed.  Looks somewhat worried but not in any distress. ?Respiratory system: Occasional right basal crackles but otherwise clear to auscultation.  No increased work of breathing. ?Cardiovascular system: S1 and S2 heard, RRR.  No JVD, murmurs or pedal edema. ?Gastrointestinal system: Abdomen is nondistended, soft and nontender. No organomegaly or masses felt. Normal bowel sounds heard. ?Central nervous system: Alert and oriented. No focal neurological deficits. ?  Extremities: Symmetric 5 x 5 power. ?Skin: Minimal and reduced bogginess of the left AC joint without any other acute inflammatory findings.  Right shoulder area/suprascapular area  and right upper arm with mild diffuse swelling, slight warmth, mild tenderness and pain full range of movements of right shoulder and unable to lift against gravity.  No crepitus or erythema.  Left knee with now reduced swelling a

## 2021-11-27 NOTE — TOC Progression Note (Signed)
Transition of Care (TOC) - Progression Note  ? ?Patient Details  ?Name: David Strong ?MRN: [id number] ?Date of Birth: 09-02-1955 ? ?Transition of Care (TOC) CM/SW Contact  ?Sherie Don, LCSW ?Phone Number: ?11/27/2021, 11:26 AM ? ?Clinical Narrative: Benefits check for Xarelto and Eliquis completed. CSW provided medication cost information to patient. TOC to follow. ? ?Expected Discharge Plan: Home/Self Care ?Barriers to Discharge: Continued Medical Work up ? ?Expected Discharge Plan and Services ?Expected Discharge Plan: Home/Self Care ?Discharge Planning Services: CM Consult ?Post Acute Care Choice: NA ?Living arrangements for the past 2 months: [place] ? ?Readmission Risk Interventions ?   ? View : No data to display.  ?  ?  ?  ? ?

## 2021-11-27 NOTE — Care Management Important Message (Signed)
Important Message ? ?Patient Details IM Letter placed in Patients room. ?Name: David Strong ?MRN: [id number] ?Date of Birth: 1956/05/27 ? ? ?Medicare Important Message Given:  Yes ? ? ? ? ?Caren Macadam ?11/27/2021, 2:28 PM ?

## 2021-11-27 NOTE — Progress Notes (Signed)
PT Cancellation Note ? ?Patient Details ?Name: David Strong ?MRN: [id number] ?DOB: 1955-11-12 ? ? ?Cancelled Treatment:    Reason Eval/Treat Not Completed: Medical issues which prohibited therapy. Pt with new L LE DVT and R UE superficial clot.  Not in therapeutic range and just started Eliquis. Per department protocol, will need to wait at least 3 hours after initial Eliquis dose to do therapy.  Will check back as schedule permits. ? ? ?Enzo Montgomery ?11/27/2021, 9:28 AM ?

## 2021-11-28 ENCOUNTER — Inpatient Hospital Stay (HOSPITAL_COMMUNITY): Payer: Medicare Other

## 2021-11-28 DIAGNOSIS — A419 Sepsis, unspecified organism: Secondary | ICD-10-CM | POA: Diagnosis not present

## 2021-11-28 DIAGNOSIS — R600 Localized edema: Secondary | ICD-10-CM | POA: Diagnosis not present

## 2021-11-28 DIAGNOSIS — R6520 Severe sepsis without septic shock: Secondary | ICD-10-CM | POA: Diagnosis not present

## 2021-11-28 DIAGNOSIS — I82402 Acute embolism and thrombosis of unspecified deep veins of left lower extremity: Secondary | ICD-10-CM

## 2021-11-28 DIAGNOSIS — M00211 Other streptococcal arthritis, right shoulder: Secondary | ICD-10-CM

## 2021-11-28 DIAGNOSIS — R609 Edema, unspecified: Secondary | ICD-10-CM | POA: Diagnosis not present

## 2021-11-28 DIAGNOSIS — J189 Pneumonia, unspecified organism: Secondary | ICD-10-CM | POA: Diagnosis not present

## 2021-11-28 LAB — COMPREHENSIVE METABOLIC PANEL
ALT: 68 U/L — ABNORMAL HIGH (ref 0–44)
AST: 83 U/L — ABNORMAL HIGH (ref 15–41)
Albumin: 1.8 g/dL — ABNORMAL LOW (ref 3.5–5.0)
Alkaline Phosphatase: 334 U/L — ABNORMAL HIGH (ref 38–126)
Anion gap: 7 (ref 5–15)
BUN: 13 mg/dL (ref 8–23)
CO2: 27 mmol/L (ref 22–32)
Calcium: 7.7 mg/dL — ABNORMAL LOW (ref 8.9–10.3)
Chloride: 99 mmol/L (ref 98–111)
Creatinine, Ser: 0.63 mg/dL (ref 0.61–1.24)
GFR, Estimated: >60 mL/min (ref >60)
Glucose, Bld: 255 mg/dL — ABNORMAL HIGH (ref 70–99)
Potassium: 4.1 mmol/L (ref 3.5–5.1)
Sodium: 133 mmol/L — ABNORMAL LOW (ref 135–145)
Total Bilirubin: 1.5 mg/dL — ABNORMAL HIGH (ref 0.3–1.2)
Total Protein: 5.4 g/dL — ABNORMAL LOW (ref 6.5–8.1)

## 2021-11-28 LAB — CBC
HCT: 36.2 % — ABNORMAL LOW (ref 39.0–52.0)
Hemoglobin: 12.8 g/dL — ABNORMAL LOW (ref 13.0–17.0)
MCH: 29.8 pg (ref 26.0–34.0)
MCHC: 35.4 g/dL (ref 30.0–36.0)
MCV: 84.4 fL (ref 80.0–100.0)
Platelets: 191 10*3/uL (ref 150–400)
RBC: 4.29 MIL/uL (ref 4.22–5.81)
RDW: 14.4 % (ref 11.5–15.5)
WBC: 12.3 10*3/uL — ABNORMAL HIGH (ref 4.0–10.5)
nRBC: 0 % (ref 0.0–0.2)

## 2021-11-28 LAB — GLUCOSE, CAPILLARY
Glucose-Capillary: 261 mg/dL — ABNORMAL HIGH (ref 70–99)
Glucose-Capillary: 228 mg/dL — ABNORMAL HIGH (ref 70–99)
Glucose-Capillary: 263 mg/dL — ABNORMAL HIGH (ref 70–99)
Glucose-Capillary: 264 mg/dL — ABNORMAL HIGH (ref 70–99)
Glucose-Capillary: 234 mg/dL — ABNORMAL HIGH (ref 70–99)

## 2021-11-28 MED ORDER — GADOBUTROL 1 MMOL/ML IV SOLN
8.0000 mL | Freq: Once | INTRAVENOUS | Status: AC | PRN
  Administered 2021-11-28: 10 mL via INTRAVENOUS

## 2021-11-28 MED ORDER — FUROSEMIDE 40 MG PO TABS
40.0000 mg | ORAL_TABLET | Freq: Once | ORAL | Status: AC
  Administered 2021-11-28: 40 mg via ORAL
  Filled 2021-11-28: qty 1

## 2021-11-28 NOTE — Progress Notes (Signed)
Inpatient Diabetes Program Recommendations ? ?AACE/ADA: New Consensus Statement on Inpatient Glycemic Control (2015) ? ?Target Ranges:  Prepandial:   less than 140 mg/dL ?     Peak postprandial:   less than 180 mg/dL (1-2 hours) ?     Critically ill patients:  140 - 180 mg/dL  ? ?Lab Results  ?Component Value Date  ? GLUCAP 228 (H) 11/28/2021  ? HGBA1C 8.6 (H) 11/23/2021  ? ? ?Review of Glycemic Control ? ?Diabetes history: DM2 ?Outpatient Diabetes medications: glipizide 10 QAM, not taking [place]. ?Current orders for Inpatient glycemic control: Semglee 18 units QD, Novolog 0-15 units TID with meals and 0-5 HS + 5 units TID ? ?HgbA1C - 8.6% ?Pt states he does not want to go home on insulin. Said he controls blood sugars at home a lot better than inpatient because of high fiber diet he consumes. ? ?Inpatient Diabetes Program Recommendations:   ? ?Consider increasing Semglee to 20 units QD ?Consider increasing Novolog to 6 units TID if eating > 50% of meal ? ?Continue to follow.  ? ?Thank you. ?Ailene Ards, RD, LDN, CDE ?Inpatient Diabetes Coordinator ?[phone]  ? ? ? ? ?

## 2021-11-28 NOTE — Progress Notes (Signed)
RLE venous duplex has been completed. ? ? ?Results can be found under chart review under CV PROC. ?11/28/2021 11:38 AM ?Brennden Masten RVT, RDMS ? ?

## 2021-11-28 NOTE — Progress Notes (Signed)
Physical Therapy Treatment ?Patient Details ?Name: David Strong ?MRN: [id number] ?DOB: 1955-11-05 ?Today's Date: 11/28/2021 ? ? ?History of Present Illness 66 year old male with medical history significant for type II DM, presented to the ED with a couple days history of progressive productive cough, subjective fevers, chills, myalgia, severe arthralgia and new onset erythema and swelling on the left side of the base of the neck.  CTA showed no PE, confirmed RML and bibasilar airspace opacities consistent with multifocal pneumonia and also left sternoclavicular joint inflammatory changes with scattered foci of air within the soft tissues and sternoclavicular joint space compatible with localized left sternoclavicular septic arthritis/cellulitis, without evidence of drainable fluid collection or large joint effusion.  Subsequent BCID and urine antigen positive for strep.  Antibiotics narrowed to IV ceftriaxone.  Admitted for severe sepsis, POA due to streptococcal pneumonia with bacteremia, left sternoclavicular joint septic arthritis and other large joint arthralgia/arthritis (right shoulder, left knee).  IR planning ultrasound-guided possible aspiration of left [place] joint. +RUE superficial thrombosis and L LE DVT ? ?  ?PT Comments  ? ? Pt agreeable to mobility however limited by pain. Amb short distance to recliner. Repositioned in chair and pt reported incr comfort/decr pain with placement of pillows as noted below. Will continue to follow in acute setting. Once pain improved pt should be able to return home with HHPT  ?  ?Recommendations for follow up therapy are one component of a multi-disciplinary discharge planning process, led by the attending physician.  Recommendations may be updated based on patient status, additional functional criteria and insurance authorization. ? ?Follow Up Recommendations ? Home health PT ?  ?  ?Assistance Recommended at Discharge Intermittent Supervision/Assistance  ?Patient can return  home with the following A little help with walking and/or transfers;A little help with bathing/dressing/bathroom;Assistance with cooking/housework;Assist for transportation;Help with stairs or ramp for entrance ?  ?Equipment Recommendations ? Rolling walker (2 wheels)  ?  ?Recommendations for Other Services   ? ? ?  ?Precautions / Restrictions Precautions ?Precautions: Fall ?Restrictions ?Weight Bearing Restrictions: No  ?  ? ?Mobility ? Bed Mobility ?Overal bed mobility: Needs Assistance ?Bed Mobility: Supine to Sit ?  ?  ?Supine to sit: Min assist ?  ?  ?General bed mobility comments: Increased time with use of rail, assist to progress LLE off bed and bed pad used to scoot hips forward once in sitting ?  ? ?Transfers ?Overall transfer level: Needs assistance ?Equipment used: Rolling walker (2 wheels) ?Transfers: Sit to/from Stand ?Sit to Stand: Min assist ?  ?  ?  ?  ?  ?General transfer comment: Steady assist with cues for transition position use of UEs to self assist ?  ? ?Ambulation/Gait ?Ambulation/Gait assistance: Min assist ?Gait Distance (Feet): 6 Feet ?Assistive device: Rolling walker (2 wheels) ?Gait Pattern/deviations: Step-to pattern, Decreased step length - right, Decreased step length - left, Decreased stance time - left ?  ?  ?  ?General Gait Details: incr time, cues for sequence and RW position. pain limiting distance ? ? ?Stairs ?  ?  ?  ?  ?  ? ? ?Wheelchair Mobility ?  ? ?Modified Rankin (Stroke Patients Only) ?  ? ? ?  ?Balance   ?  ?  ?  ?  ?  ?  ?  ?  ?  ?  ?  ?  ?  ?  ?  ?  ?  ?  ?  ? ?  ?Cognition Arousal/Alertness: Awake/alert ?Behavior During Therapy: [facility] for tasks  assessed/performed ?Overall Cognitive Status: Within Functional Limits for tasks assessed ?  ?  ?  ?  ?  ?  ?  ?  ?  ?  ?  ?  ?  ?  ?  ?  ?  ?  ?  ? ?  ?Exercises General Exercises - Lower Extremity ?Ankle Circles/Pumps: AROM, Both, 10 reps ?Quad Sets: AROM, Both, 5 reps ?Heel Slides: AROM, Left, 5 reps, AAROM ? ?  ?General  Comments General comments (skin integrity, edema, etc.): repositioned RUE with elevation/support and pillows under LEs in chair with pt reporting incr comfort ?  ?  ? ?Pertinent Vitals/Pain Pain Assessment ?Pain Assessment: 0-10 ?Pain Score: 7  ?Pain Location: Back, right shoulder, left knee ?Pain Descriptors / Indicators: Discomfort, Grimacing, Tightness ?Pain Intervention(s): Limited activity within patient's tolerance, Monitored during session, Repositioned  ? ? ?Home Living   ?  ?  ?  ?  ?  ?  ?  ?  ?  ?   ?  ?Prior Function    ?  ?  ?   ? ?PT Goals (current goals can now be found in the care plan section) Acute Rehab PT Goals ?Patient Stated Goal: Regain IND; less pain ?PT Goal Formulation: With patient ?Time For Goal Achievement: 12/10/21 ?Potential to Achieve Goals: Good ?Progress towards PT goals: Progressing toward goals ? ?  ?Frequency ? ? ? Min 3X/week ? ? ? ?  ?PT Plan Current plan remains appropriate  ? ? ?Co-evaluation   ?  ?  ?  ?  ? ?  ?AM-PAC PT "6 Clicks" Mobility   ?Outcome Measure ? Help needed turning from your back to your side while in a flat bed without using bedrails?: None ?Help needed moving from lying on your back to sitting on the side of a flat bed without using bedrails?: A Little ?Help needed moving to and from a bed to a chair (including a wheelchair)?: A Little ?Help needed standing up from a chair using your arms (e.g., wheelchair or bedside chair)?: A Little ?Help needed to walk in hospital room?: A Little ?Help needed climbing 3-5 steps with a railing? : A Lot ?6 Click Score: 18 ? ?  ?End of Session Equipment Utilized During Treatment: Gait belt ?Activity Tolerance: Patient limited by pain ?Patient left: in chair;with call bell/phone within reach;with chair alarm set ?Nurse Communication: Mobility status ?PT Visit Diagnosis: Difficulty in walking, not elsewhere classified (R26.2);Pain ?Pain - part of body: Knee;Shoulder ?  ? ? ?Time: 2297-9892 ?PT Time Calculation (min) (ACUTE  ONLY): 18 min ? ?Charges:  $Gait Training: 8-22 mins          ?          ? Delice Bison, PT ? ?Acute Rehab Dept [facility]) [phone] ?Pager [phone] ? ?11/28/2021 ? ? ? ?David Strong ?11/28/2021, 3:22 PM ? ?

## 2021-11-28 NOTE — Assessment & Plan Note (Signed)
Lasix 40 mg p.o. x1 dose then reassess in a.m. ?Likely due to IV fluids early on in admission and hypoalbuminemic state. ?Also has left lower extremity DVT. ?

## 2021-11-28 NOTE — Progress Notes (Signed)
?PROGRESS NOTE ?  ?David Strong  VFI:[id number]    DOB: 09-14-1955    DOA: 11/22/2021 ? ?PCP: Horald Pollen, MD  ? ?I have briefly reviewed patients previous medical records in [facility]. ? ?Chief Complaint  ?Patient presents with  ? Muscle Pain  ? ? ?Hospital Course:  ?66 year old male with medical history significant for type II DM, presented to the ED with a couple days history of progressive productive cough, subjective fevers, chills, myalgia, severe arthralgia and new onset erythema and swelling on the left side of the base of the neck.  CTA showed no PE, confirmed RML and bibasilar airspace opacities consistent with multifocal pneumonia and also left sternoclavicular joint inflammatory changes with scattered foci of air within the soft tissues and sternoclavicular joint space compatible with localized left sternoclavicular septic arthritis/cellulitis, without evidence of drainable fluid collection or large joint effusion.  Subsequent BCID and urine antigen positive for strep.  Antibiotics narrowed to IV ceftriaxone.  Admitted for severe sepsis, POA due to streptococcal pneumonia with bacteremia, left sternoclavicular joint septic arthritis and other large joint arthralgia/arthritis (right shoulder, left knee).  Clinically improving.  Biggest issue is now arthralgia of right shoulder with painful elevation.  Newly diagnosed right upper arm SVT and LLE DVT, briefly on IV heparin, transitioned to Eliquis.  Awaiting MRI right shoulder to rule out septic arthritis.  Significant leg/presacral edema, initiated oral Lasix.  ID continue to follow. ? ?Assessment and Plan: ?* Severe sepsis ([place]) ?Due to streptococcal multifocal pneumonia with bacteremia, presumed left sternoclavicular joint septic arthritis and other polyarthralgia/arthritis (right shoulder, left knee) ?Met sepsis criteria on admission including persistent tachypnea, tachycardia and did spike a fever of 100.6 overnight of admission.  CTA  chest confirmed multifocal pneumonia.  Elevated lactate. ?Treated with normal saline bolus 1 L, followed by maintenance IV fluids. ?Empirically started on IV vancomycin, ceftriaxone and azithromycin. ?BCID and urine positive for Streptococcus.  Antibiotics then narrowed to IV ceftriaxone and further changed to penicillin G on 4/10.  Will need extended course of oral antibiotics at discharge due to septic arthritis.  ID to recommend choice and duration. ?Infectious disease consultation and follow-up appreciated.  ?TTE: LVEF 60-65%.  No evidence of valvular vegetations.  Surveillance blood cultures 4/7: NTD ?Minimal fluid on ultrasound of left [place] joint, patient declined aspiration and IR also felt that there was not much to aspirate. ?Sepsis physiology has resolved but still with significant arthralgias/arthritis of above joints.  Still with significant pain and swelling of right shoulder area, difficult to raise against gravity, discontinued NSAIDs due to initiation of anticoagulation.  Adjusting pain meds. ?Right shoulder x-ray with chronic and no acute findings.  Now with right upper arm SVT and left lower extremity DVT on anticoagulation. ?As per ID follow-up, ordered left ankle MRI (suggestive of cellulitis and not septic arthritis) and MRI right shoulder (patient could not do it same time last night and is to be done). ?Significant leg and presacral edema, likely related to volume resuscitation early on in admission and hypoalbuminemia.  Oral Lasix 40 mg x 1 dose and monitor.  May need further doses of Lasix. ? ?Septic arthritis ([place]) ?Left sternoclavicular joint septic arthritis ?Has other polyarthralgias/arthritis (right shoulder and left knee) ?CTA chest report as noted above. ?On admission, case was discussed by EDP with on-call CT surgeon who felt that case and imaging were consistent with septic arthritis of the left sternoclavicular joint and recommended medical management and IV antibiotics without need  for any  surgical intervention. ?Per ultrasound, minimal fluid in left [place] joint, IR did not think would be high yield and moreover patient declined aspiration. ?Still has significant right shoulder area pain.  This may be multifactorial related to SVT and may be septic arthritis. ?As per ID follow-up, ordered left ankle MRI (suggestive of cellulitis and not septic arthritis) and MRI right shoulder (patient could not do it same time last night and is to be done). ? ?CAP (community acquired pneumonia) ?Pneumococcal pneumonia ?Management as noted above. ?As per patient's report, patient's 19-year-old son who goes to daycare had runny nose a couple days prior to patient getting sick. ? ?DVT (deep venous thrombosis) ([place]) ?Right upper extremity: SVT.  Supportive treatment.  Would avoid NSAIDs given starting anticoagulation below. ?Left lower extremity: Age-indeterminate DVT left popliteal vein, acute DVT left gastrocnemius and left posterior tibial veins. ?Briefly on IV heparin, transitioned to Eliquis. ?Due to complaint of bilateral leg pain and swelling, obtained RLE venous Dopplers which were negative for DVT or SVT. ? ?Transaminitis ?Mild and likely related to severe sepsis. ?No GI symptoms. ?Acetaminophen level 14, salicylate level less than 7 and acute hepatitis panel negative.  UDS positive only for opiates. ?LFTs are actually slowly creeping up.?  Related to antibiotics or sepsis itself. ?LFTs slightly better today.  No GI symptoms. ?RUQ ultrasound with gallbladder sludge and no acute findings. ?Continue to trend daily CMP. ? ?High anion gap metabolic acidosis ?Lactic acidosis ?Secondary to severe sepsis. ?Presented with lactate of 2.3 which has improved to 2.1.  Bicarbonate normal.  Anion gap has normalized. ?Resolved. ? ?Thrombocytopenia ([place]) ?Suspect chronic or acute on chronic ?Platelet counts in 2018: 104 ?Resolved. ? ?Type 2 diabetes mellitus ([place]) ?A1c 8 on 10/12/2021. ?On glipizide and dapagliflozin at  home.  Currently held. ?CBGs uncontrolled and fluctuating.  Increased Semglee to 18 units daily continue NovoLog SSI moderate sensitivity and increased NovoLog meal time 5 units 3 times daily ?Diabetes coordinator consulted ?Monitor CBGs closely and adjust insulins as needed. ?Widely fluctuating. ? ?Hypokalemia ?Replaced.  Magnesium 2.8. ? ?Hypophosphatemia ?Replaced. ? ?Bilateral leg edema ?Lasix 40 mg p.o. x1 dose then reassess in a.m. ?Likely due to IV fluids early on in admission and hypoalbuminemic state. ?Also has left lower extremity DVT. ? ?Constipation ?Aggressive bowel regimen. ?BMs documented. ? ? ?Body mass index is 32.28 kg/m?. ? ?DVT prophylaxis: SCDs Start: 11/22/21 1920   ?  Code Status: Full Code:  ?Family Communication: Spouse via patient's phone on speaker.  Updated care and answered all questions. ?Status is: Inpatient ?Remains inpatient appropriate because: Further evaluation of right shoulder pain, MRI pending, ongoing IV antibiotics. ?  ? ?Consultants:   ?Infectious disease ?IR ? ?Procedures:   ?None ? ?Antimicrobials:   ?IV ceftriaxone ? ? ?Subjective:  ?States that he finally slept well last night.  Indicates both leg swelling, tightness and some soreness.  Right shoulder without change and still unable to lift due to pain and not weakness.  No dyspnea.  Mild intermittent dry cough. ? ?Objective:  ? ?Vitals:  ? 11/27/21 1334 11/27/21 2240 11/28/21 0520 11/28/21 1351  ?BP: 137/82 (!) 149/85 131/81 129/82  ?Pulse: 100 100 100 (!) 104  ?Resp: _0 ?Temp: 98.6 ?F (37 ?C) 98.9 ?F (37.2 ?C) 98.8 ?F (37.1 ?C) 99.9 ?F (37.7 ?C)  ?TempSrc: Oral Oral Oral Oral  ?SpO2: 98% 97% 96% 100%  ?Weight:      ?Height:      ? ? ?General exam: Middle-age male, moderately  built and nourished sitting up comfortably in chair without distress. ?Respiratory system: Clear to auscultation.  No increased work of breathing ?Cardiovascular system: S1 and S2 heard, RRR.  No JVD, murmurs or pedal  edema. ?Gastrointestinal system: Abdomen is nondistended, soft and nontender. No organomegaly or masses felt. Normal bowel sounds heard. ?Central nervous system: Alert and oriented. No focal neurological deficits. ?Extremities: S

## 2021-11-28 NOTE — Progress Notes (Signed)
? ? ? ? ? ? ?Subjective: ?Patient with worsening shoulder pain now some pain rating into the back near the right shoulder.  Left leg pain ? ? ?Antibiotics:  ?Anti-infectives (From admission, onward)  ? ? Start     Dose/Rate Route Frequency Ordered Stop  ? 11/27/21 1000  penicillin G potassium 12 Million Units in dextrose 5 % 500 mL continuous infusion       ? 12 Million Units ?41.7 mL/hr over 12 Hours Intravenous Every 12 hours 11/27/21 0901    ? 11/23/21 2000  vancomycin (VANCOREADY) IVPB 1750 mg/350 mL  Status:  Discontinued       ? 1,750 mg ?175 mL/hr over 120 Minutes Intravenous Every 24 hours 11/22/21 1943 11/23/21 0931  ? 11/22/21 2300  cefTRIAXone (ROCEPHIN) 2 g in sodium chloride 0.9 % 100 mL IVPB  Status:  Discontinued       ? 2 g ?200 mL/hr over 30 Minutes Intravenous Every 24 hours 11/22/21 1923 11/27/21 0901  ? 11/22/21 2000  azithromycin (ZITHROMAX) 500 mg in sodium chloride 0.9 % 250 mL IVPB  Status:  Discontinued       ? 500 mg ?250 mL/hr over 60 Minutes Intravenous Every 24 hours 11/22/21 1923 11/23/21 0931  ? 11/22/21 1830  ceFEPIme (MAXIPIME) 2 g in sodium chloride 0.9 % 100 mL IVPB       ? 2 g ?200 mL/hr over 30 Minutes Intravenous  Once 11/22/21 1825 11/22/21 1949  ? 11/22/21 1830  vancomycin (VANCOREADY) IVPB 1750 mg/350 mL       ? 1,750 mg ?175 mL/hr over 120 Minutes Intravenous  Once 11/22/21 1825 11/22/21 2147  ? ?  ? ? ?Medications: ?Scheduled Meds: ? apixaban  10 mg Oral BID  ? Followed by  ? [START ON 12/04/2021] apixaban  5 mg Oral BID  ? insulin aspart  0-15 Units Subcutaneous TID WC  ? insulin aspart  0-5 Units Subcutaneous QHS  ? insulin aspart  5 Units Subcutaneous TID WC  ? insulin glargine-yfgn  18 Units Subcutaneous Daily  ? lidocaine  1 patch Transdermal Q24H  ? polyethylene glycol  17 g Oral BID  ? Ensure Max Protein  11 oz Oral BID  ? senna-docusate  2 tablet Oral Daily  ? ?Continuous Infusions: ? penicillin g continuous IV infusion 12 Million Units (11/28/21 1022)  ? ?PRN  Meds:.acetaminophen **OR** [DISCONTINUED] acetaminophen, benzonatate, bisacodyl, naLOXone (NARCAN)  injection, oxyCODONE ? ? ? ?Objective: ?Weight change:  ? ?Intake/Output Summary (Last 24 hours) at 11/28/2021 1431 ?Last data filed at 11/28/2021 0715 ?Gross per 24 hour  ?Intake 740 ml  ?Output 400 ml  ?Net 340 ml  ? ? ?Blood pressure 129/82, pulse (!) 104, temperature 99.9 ?F (37.7 ?C), temperature source Oral, resp. rate 17, height 5\' 4"  (1.626 m), weight 85.3 kg, SpO2 100 %. ?Temp:  [98.8 ?F (37.1 ?C)-99.9 ?F (37.7 ?C)] 99.9 ?F (37.7 ?C) (04/11 1351) ?Pulse Rate:  [100-104] 104 (04/11 1351) ?Resp:  [17-20] 17 (04/11 0520) ?BP: (129-149)/(81-85) 129/82 (04/11 1351) ?SpO2:  [96 %-100 %] 100 % (04/11 1351) ? ?Physical Exam: ?Physical Exam ?Constitutional:   ?   Appearance: He is well-developed.  ?HENT:  ?   Head: Normocephalic and atraumatic.  ?Eyes:  ?   Conjunctiva/sclera: Conjunctivae normal.  ?Cardiovascular:  ?   Rate and Rhythm: Normal rate.  ?   Heart sounds: Normal heart sounds. No murmur heard. ?  No friction rub. No gallop.  ?Pulmonary:  ?   Effort: Pulmonary  effort is normal. No respiratory distress.  ?   Breath sounds: Normal breath sounds. No stridor. No wheezing or rhonchi.  ?Chest:  ?   Chest wall: Mass and swelling present.  ?Abdominal:  ?   General: Bowel sounds are normal. There is no distension.  ?   Palpations: Abdomen is soft.  ?Musculoskeletal:  ?   Right shoulder: Swelling and tenderness present. Decreased range of motion.  ?   Right upper arm: Swelling present.  ?   Left ankle: Swelling present. Tenderness present.  ?Skin: ?   General: Skin is warm and dry.  ?   Findings: No erythema or rash.  ?Neurological:  ?   General: No focal deficit present.  ?   Mental Status: He is alert and oriented to person, place, and time.  ?Psychiatric:     ?   Mood and Affect: Mood normal.     ?   Behavior: Behavior normal.     ?   Thought Content: Thought content normal.     ?   Judgment: Judgment normal.  ?   ? ?CBC: ? ? ? ?BMET ?Recent Labs  ?  11/27/21 ?[id number] 11/28/21 ?0320  ?NA 133* 133*  ?K 4.1 4.1  ?CL 98 99  ?CO2 27 27  ?GLUCOSE 212* 255*  ?BUN 18 13  ?CREATININE 0.59* 0.63  ?CALCIUM 7.8* 7.7*  ? ? ? ? ?Liver Panel ? ?Recent Labs  ?  11/27/21 ?[id number] 11/28/21 ?0320  ?PROT 5.7* 5.4*  ?ALBUMIN 2.0* 1.8*  ?AST 125* 83*  ?ALT 84* 68*  ?ALKPHOS 349* 334*  ?BILITOT 1.4* 1.5*  ? ? ? ? ? ? ?Sedimentation Rate ?No results for input(s): ESRSEDRATE in the last 72 hours. ?C-Reactive Protein ?No results for input(s): CRP in the last 72 hours. ? ?Micro Results: ?Recent Results (from the past 720 hour(s))  ?Blood culture (routine x 2)     Status: Abnormal  ? Collection Time: 11/22/21  4:10 PM  ? Specimen: BLOOD  ?Result Value Ref Range Status  ? Specimen Description   Final  ?  BLOOD BLOOD LEFT FOREARM ?Performed at [facility], [place] [address], [place], [place] [address] ?  ? Special Requests   Final  ?  BOTTLES DRAWN AEROBIC AND ANAEROBIC Blood Culture results may not be optimal due to an inadequate volume of blood received in culture bottles ?Performed at [facility], [place] [address], [place], [place] [address] ?  ? Culture  Setup Time   Final  ?  GRAM POSITIVE COCCI ?IN BOTH AEROBIC AND ANAEROBIC BOTTLES ?Organism ID to follow ?CRITICAL RESULT CALLED TO, READ BACK BY AND VERIFIED WITH: N. [place], AT [phone] 11/23/21 D. VANHOOK ?Performed at [facility], [place] [address], [place], [place] [address] ?  ? Culture STREPTOCOCCUS PNEUMONIAE (A)  Final  ? Report Status 11/25/2021 FINAL  Final  ? Organism ID, Bacteria STREPTOCOCCUS PNEUMONIAE  Final  ?    Susceptibility  ? Streptococcus pneumoniae - MIC*  ?  ERYTHROMYCIN >=8 RESISTANT Resistant   ?  LEVOFLOXACIN 0.5 SENSITIVE Sensitive   ?  VANCOMYCIN 0.5 SENSITIVE Sensitive   ?  PENICILLIN (meningitis) <=0.06 SENSITIVE Sensitive   ?  PENO - penicillin <=0.06    ?  PENICILLIN (non-meningitis) <=0.06 SENSITIVE Sensitive   ?  PENICILLIN  (oral) <=0.06 SENSITIVE Sensitive   ?  CEFTRIAXONE (non-meningitis) <=0.12 SENSITIVE Sensitive   ?  CEFTRIAXONE (meningitis) <=0.12 SENSITIVE Sensitive   ?  * STREPTOCOCCUS  PNEUMONIAE  ?Blood Culture ID Panel (Reflexed)     Status: Abnormal  ? Collection Time: 11/22/21  4:10 PM  ?Result Value Ref Range Status  ? Enterococcus faecalis NOT DETECTED NOT DETECTED Final  ? Enterococcus Faecium NOT DETECTED NOT DETECTED Final  ? Listeria monocytogenes NOT DETECTED NOT DETECTED Final  ? Staphylococcus species NOT DETECTED NOT DETECTED Final  ? Staphylococcus aureus (BCID) NOT DETECTED NOT DETECTED Final  ? Staphylococcus epidermidis NOT DETECTED NOT DETECTED Final  ? Staphylococcus lugdunensis NOT DETECTED NOT DETECTED Final  ? Streptococcus species DETECTED (A) NOT DETECTED Final  ?  Comment: CRITICAL RESULT CALLED TO, READ BACK BY AND VERIFIED WITH: Guadlupe Spanish PHARMD, AT [phone] 11/23/21 D. VANHOOK ?  ? Streptococcus agalactiae NOT DETECTED NOT DETECTED Final  ? Streptococcus pneumoniae DETECTED (A) NOT DETECTED Final  ?  Comment: CRITICAL RESULT CALLED TO, READ BACK BY AND VERIFIED WITH: ?N. [place], AT [phone] 11/23/21 D. VANHOOK ?  ? Streptococcus pyogenes NOT DETECTED NOT DETECTED Final  ? A.calcoaceticus-baumannii NOT DETECTED NOT DETECTED Final  ? Bacteroides fragilis NOT DETECTED NOT DETECTED Final  ? Enterobacterales NOT DETECTED NOT DETECTED Final  ? Enterobacter cloacae complex NOT DETECTED NOT DETECTED Final  ? Escherichia coli NOT DETECTED NOT DETECTED Final  ? Klebsiella aerogenes NOT DETECTED NOT DETECTED Final  ? Klebsiella oxytoca NOT DETECTED NOT DETECTED Final  ? Klebsiella pneumoniae NOT DETECTED NOT DETECTED Final  ? Proteus species NOT DETECTED NOT DETECTED Final  ? Salmonella species NOT DETECTED NOT DETECTED Final  ? Serratia marcescens NOT DETECTED NOT DETECTED Final  ? Haemophilus influenzae NOT DETECTED NOT DETECTED Final  ? Neisseria meningitidis NOT DETECTED NOT DETECTED Final  ? Pseudomonas  aeruginosa NOT DETECTED NOT DETECTED Final  ? Stenotrophomonas maltophilia NOT DETECTED NOT DETECTED Final  ? Candida albicans NOT DETECTED NOT DETECTED Final  ? Candida auris NOT DETECTED NOT DETECTED Final  ? Ca

## 2021-11-29 DIAGNOSIS — R600 Localized edema: Secondary | ICD-10-CM | POA: Diagnosis not present

## 2021-11-29 DIAGNOSIS — I82402 Acute embolism and thrombosis of unspecified deep veins of left lower extremity: Secondary | ICD-10-CM | POA: Diagnosis not present

## 2021-11-29 DIAGNOSIS — A419 Sepsis, unspecified organism: Secondary | ICD-10-CM | POA: Diagnosis not present

## 2021-11-29 DIAGNOSIS — J189 Pneumonia, unspecified organism: Secondary | ICD-10-CM | POA: Diagnosis not present

## 2021-11-29 DIAGNOSIS — R6520 Severe sepsis without septic shock: Secondary | ICD-10-CM | POA: Diagnosis not present

## 2021-11-29 DIAGNOSIS — L0390 Cellulitis, unspecified: Secondary | ICD-10-CM | POA: Diagnosis not present

## 2021-11-29 LAB — GLUCOSE, CAPILLARY
Glucose-Capillary: 241 mg/dL — ABNORMAL HIGH (ref 70–99)
Glucose-Capillary: 240 mg/dL — ABNORMAL HIGH (ref 70–99)
Glucose-Capillary: 219 mg/dL — ABNORMAL HIGH (ref 70–99)
Glucose-Capillary: 138 mg/dL — ABNORMAL HIGH (ref 70–99)

## 2021-11-29 LAB — COMPREHENSIVE METABOLIC PANEL
ALT: 57 U/L — ABNORMAL HIGH (ref 0–44)
AST: 56 U/L — ABNORMAL HIGH (ref 15–41)
Albumin: 1.9 g/dL — ABNORMAL LOW (ref 3.5–5.0)
Alkaline Phosphatase: 315 U/L — ABNORMAL HIGH (ref 38–126)
Anion gap: 7 (ref 5–15)
BUN: 12 mg/dL (ref 8–23)
CO2: 28 mmol/L (ref 22–32)
Calcium: 8 mg/dL — ABNORMAL LOW (ref 8.9–10.3)
Chloride: 97 mmol/L — ABNORMAL LOW (ref 98–111)
Creatinine, Ser: 0.79 mg/dL (ref 0.61–1.24)
GFR, Estimated: >60 mL/min (ref >60)
Glucose, Bld: 247 mg/dL — ABNORMAL HIGH (ref 70–99)
Potassium: 4.6 mmol/L (ref 3.5–5.1)
Sodium: 132 mmol/L — ABNORMAL LOW (ref 135–145)
Total Bilirubin: 1.2 mg/dL (ref 0.3–1.2)
Total Protein: 5.7 g/dL — ABNORMAL LOW (ref 6.5–8.1)

## 2021-11-29 LAB — CBC
HCT: 39.1 % (ref 39.0–52.0)
Hemoglobin: 13.1 g/dL (ref 13.0–17.0)
MCH: 29.4 pg (ref 26.0–34.0)
MCHC: 33.5 g/dL (ref 30.0–36.0)
MCV: 87.9 fL (ref 80.0–100.0)
Platelets: 234 10*3/uL (ref 150–400)
RBC: 4.45 MIL/uL (ref 4.22–5.81)
RDW: 14.5 % (ref 11.5–15.5)
WBC: 13.1 10*3/uL — ABNORMAL HIGH (ref 4.0–10.5)
nRBC: 0 % (ref 0.0–0.2)

## 2021-11-29 LAB — CULTURE, BLOOD (ROUTINE X 2)
Culture: NO GROWTH
Culture: NO GROWTH
Special Requests: ADEQUATE
Special Requests: ADEQUATE

## 2021-11-29 MED ORDER — INSULIN ASPART 100 UNIT/ML IJ SOLN
0.0000 [IU] | Freq: Three times a day (TID) | INTRAMUSCULAR | Status: DC
  Administered 2021-11-29: 7 [IU] via SUBCUTANEOUS
  Administered 2021-11-30: 3 [IU] via SUBCUTANEOUS
  Administered 2021-11-30: 15 [IU] via SUBCUTANEOUS
  Administered 2021-11-30: 11 [IU] via SUBCUTANEOUS
  Administered 2021-12-01: 7 [IU] via SUBCUTANEOUS
  Administered 2021-12-01: 4 [IU] via SUBCUTANEOUS
  Administered 2021-12-02: 7 [IU] via SUBCUTANEOUS
  Administered 2021-12-02 – 2021-12-03 (×2): 4 [IU] via SUBCUTANEOUS
  Administered 2021-12-03: 7 [IU] via SUBCUTANEOUS
  Administered 2021-12-03 – 2021-12-04 (×2): 4 [IU] via SUBCUTANEOUS

## 2021-11-29 MED ORDER — INSULIN ASPART 100 UNIT/ML IJ SOLN
6.0000 [IU] | Freq: Three times a day (TID) | INTRAMUSCULAR | Status: DC
  Administered 2021-11-29 – 2021-12-05 (×13): 6 [IU] via SUBCUTANEOUS

## 2021-11-29 MED ORDER — INSULIN GLARGINE-YFGN 100 UNIT/ML ~~LOC~~ SOLN
20.0000 [IU] | Freq: Every day | SUBCUTANEOUS | Status: DC
  Administered 2021-11-30 – 2021-12-03 (×4): 20 [IU] via SUBCUTANEOUS
  Filled 2021-11-29 (×6): qty 0.2

## 2021-11-29 MED ORDER — INSULIN ASPART 100 UNIT/ML IJ SOLN: 0.0000 [IU] | Freq: Every day | INTRAMUSCULAR | Status: DC

## 2021-11-29 MED ORDER — MELATONIN 5 MG PO TABS: 5.0000 mg | ORAL_TABLET | Freq: Once | ORAL | Status: DC

## 2021-11-29 NOTE — Progress Notes (Signed)
? ? ? ? ? ? ?Subjective: ?Patient continues to have left leg pain as well as right shoulder pain ? ? ?Antibiotics:  ?Anti-infectives (From admission, onward)  ? ? Start     Dose/Rate Route Frequency Ordered Stop  ? 11/27/21 1000  penicillin G potassium 12 Million Units in dextrose 5 % 500 mL continuous infusion       ? 12 Million Units ?41.7 mL/hr over 12 Hours Intravenous Every 12 hours 11/27/21 0901    ? 11/23/21 2000  vancomycin (VANCOREADY) IVPB 1750 mg/350 mL  Status:  Discontinued       ? 1,750 mg ?175 mL/hr over 120 Minutes Intravenous Every 24 hours 11/22/21 1943 11/23/21 0931  ? 11/22/21 2300  cefTRIAXone (ROCEPHIN) 2 g in sodium chloride 0.9 % 100 mL IVPB  Status:  Discontinued       ? 2 g ?200 mL/hr over 30 Minutes Intravenous Every 24 hours 11/22/21 1923 11/27/21 0901  ? 11/22/21 2000  azithromycin (ZITHROMAX) 500 mg in sodium chloride 0.9 % 250 mL IVPB  Status:  Discontinued       ? 500 mg ?250 mL/hr over 60 Minutes Intravenous Every 24 hours 11/22/21 1923 11/23/21 0931  ? 11/22/21 1830  ceFEPIme (MAXIPIME) 2 g in sodium chloride 0.9 % 100 mL IVPB       ? 2 g ?200 mL/hr over 30 Minutes Intravenous  Once 11/22/21 1825 11/22/21 1949  ? 11/22/21 1830  vancomycin (VANCOREADY) IVPB 1750 mg/350 mL       ? 1,750 mg ?175 mL/hr over 120 Minutes Intravenous  Once 11/22/21 1825 11/22/21 2147  ? ?  ? ? ?Medications: ?Scheduled Meds: ? apixaban  10 mg Oral BID  ? Followed by  ? [START ON 12/04/2021] apixaban  5 mg Oral BID  ? insulin aspart  0-15 Units Subcutaneous TID WC  ? insulin aspart  0-5 Units Subcutaneous QHS  ? insulin aspart  5 Units Subcutaneous TID WC  ? insulin glargine-yfgn  18 Units Subcutaneous Daily  ? lidocaine  1 patch Transdermal Q24H  ? polyethylene glycol  17 g Oral BID  ? Ensure Max Protein  11 oz Oral BID  ? senna-docusate  2 tablet Oral Daily  ? ?Continuous Infusions: ? penicillin g continuous IV infusion 12 Million Units (11/29/21 0518)  ? ?PRN Meds:.acetaminophen **OR** [DISCONTINUED]  acetaminophen, benzonatate, bisacodyl, naLOXone (NARCAN)  injection, oxyCODONE ? ? ? ?Objective: ?Weight change:  ? ?Intake/Output Summary (Last 24 hours) at 11/29/2021 1017 ?Last data filed at 11/29/2021 [id number] ?Gross per 24 hour  ?Intake 1138.7 ml  ?Output 2150 ml  ?Net -1011.3 ml  ? ? ?Blood pressure 139/87, pulse 97, temperature 99.1 ?F (37.3 ?C), temperature source Oral, resp. rate 15, height 5\' 4"  (1.626 m), weight 85.3 kg, SpO2 96 %. ?Temp:  [97.9 ?F (36.6 ?C)-99.9 ?F (37.7 ?C)] 99.1 ?F (37.3 ?C) (04/12 [id number]) ?Pulse Rate:  [95-104] 97 (04/12 0432) ?Resp:  [15-18] 15 (04/12 0432) ?BP: (129-139)/(77-87) 139/87 (04/12 0432) ?SpO2:  [96 %-100 %] 96 % (04/12 0432) ? ?Physical Exam: ?Physical Exam ?Constitutional:   ?   Appearance: He is obese.  ?HENT:  ?   Head: Normocephalic.  ?Cardiovascular:  ?   Heart sounds: No murmur heard. ?  No friction rub. No gallop.  ?Pulmonary:  ?   Effort: No respiratory distress.  ?   Breath sounds: No wheezing.  ?Chest:  ?   Chest wall: Mass, deformity, swelling and tenderness present.  ?Abdominal:  ?   General:  There is no distension.  ?Musculoskeletal:  ?   Right shoulder: Swelling, effusion and tenderness present. Decreased range of motion.  ?   Right upper arm: Edema present.  ?   Left upper arm: Edema present.  ?Skin: ?   General: Skin is warm and dry.  ?Neurological:  ?   General: No focal deficit present.  ?   Mental Status: He is alert and oriented to person, place, and time.  ?Psychiatric:     ?   Mood and Affect: Mood normal.     ?   Behavior: Behavior normal.     ?   Thought Content: Thought content normal.     ?   Judgment: Judgment normal.  ?  ? ?CBC: ? ? ? ?BMET ?Recent Labs  ?  11/28/21 ?0320 11/29/21 ?[id number]  ?NA 133* 132*  ?K 4.1 4.6  ?CL 99 97*  ?CO2 27 28  ?GLUCOSE 255* 247*  ?BUN 13 12  ?CREATININE 0.63 0.79  ?CALCIUM 7.7* 8.0*  ? ? ? ? ?Liver Panel ? ?Recent Labs  ?  11/28/21 ?0320 11/29/21 ?[id number]  ?PROT 5.4* 5.7*  ?ALBUMIN 1.8* 1.9*  ?AST 83* 56*  ?ALT 68* 57*   ?ALKPHOS 334* 315*  ?BILITOT 1.5* 1.2  ? ? ? ? ? ? ?Sedimentation Rate ?No results for input(s): ESRSEDRATE in the last 72 hours. ?C-Reactive Protein ?No results for input(s): CRP in the last 72 hours. ? ?Micro Results: ?Recent Results (from the past 720 hour(s))  ?Blood culture (routine x 2)     Status: Abnormal  ? Collection Time: 11/22/21  4:10 PM  ? Specimen: BLOOD  ?Result Value Ref Range Status  ? Specimen Description   Final  ?  BLOOD BLOOD LEFT FOREARM ?Performed at [facility], [place] [address], [place], [place] [address] ?  ? Special Requests   Final  ?  BOTTLES DRAWN AEROBIC AND ANAEROBIC Blood Culture results may not be optimal due to an inadequate volume of blood received in culture bottles ?Performed at [facility], [place] [address], [place], [place] [address] ?  ? Culture  Setup Time   Final  ?  GRAM POSITIVE COCCI ?IN BOTH AEROBIC AND ANAEROBIC BOTTLES ?Organism ID to follow ?CRITICAL RESULT CALLED TO, READ BACK BY AND VERIFIED WITH: N. Lowell, AT [phone] 11/23/21 D. VANHOOK ?Performed at [facility], [place] [address] [place], [place] [address] ?  ? Culture STREPTOCOCCUS PNEUMONIAE (A)  Final  ? Report Status 11/25/2021 FINAL  Final  ? Organism ID, Bacteria STREPTOCOCCUS PNEUMONIAE  Final  ?    Susceptibility  ? Streptococcus pneumoniae - MIC*  ?  ERYTHROMYCIN >=8 RESISTANT Resistant   ?  LEVOFLOXACIN 0.5 SENSITIVE Sensitive   ?  VANCOMYCIN 0.5 SENSITIVE Sensitive   ?  PENICILLIN (meningitis) <=0.06 SENSITIVE Sensitive   ?  PENO - penicillin <=0.06    ?  PENICILLIN (non-meningitis) <=0.06 SENSITIVE Sensitive   ?  PENICILLIN (oral) <=0.06 SENSITIVE Sensitive   ?  CEFTRIAXONE (non-meningitis) <=0.12 SENSITIVE Sensitive   ?  CEFTRIAXONE (meningitis) <=0.12 SENSITIVE Sensitive   ?  * STREPTOCOCCUS PNEUMONIAE  ?Blood Culture ID Panel (Reflexed)     Status: Abnormal  ? Collection Time: 11/22/21  4:10 PM  ?Result Value Ref Range Status  ? Enterococcus faecalis  NOT DETECTED NOT DETECTED Final  ? Enterococcus Faecium NOT DETECTED NOT DETECTED Final  ? Listeria monocytogenes NOT DETECTED NOT DETECTED Final  ? Staphylococcus species NOT DETECTED NOT  DETECTED Final  ? Staphylococcus aureus (BCID) NOT DETECTED NOT DETECTED Final  ? Staphylococcus epidermidis NOT DETECTED NOT DETECTED Final  ? Staphylococcus lugdunensis NOT DETECTED NOT DETECTED Final  ? Streptococcus species DETECTED (A) NOT DETECTED Final  ?  Comment: CRITICAL RESULT CALLED TO, READ BACK BY AND VERIFIED WITH: ?N. Perry, AT [phone] 11/23/21 D. VANHOOK ?  ? Streptococcus agalactiae NOT DETECTED NOT DETECTED Final  ? Streptococcus pneumoniae DETECTED (A) NOT DETECTED Final  ?  Comment: CRITICAL RESULT CALLED TO, READ BACK BY AND VERIFIED WITH: ?N. Bylas, AT [phone] 11/23/21 D. VANHOOK ?  ? Streptococcus pyogenes NOT DETECTED NOT DETECTED Final  ? A.calcoaceticus-baumannii NOT DETECTED NOT DETECTED Final  ? Bacteroides fragilis NOT DETECTED NOT DETECTED Final  ? Enterobacterales NOT DETECTED NOT DETECTED Final  ? Enterobacter cloacae complex NOT DETECTED NOT DETECTED Final  ? Escherichia coli NOT DETECTED NOT DETECTED Final  ? Klebsiella aerogenes NOT DETECTED NOT DETECTED Final  ? Klebsiella oxytoca NOT DETECTED NOT DETECTED Final  ? Klebsiella pneumoniae NOT DETECTED NOT DETECTED Final  ? Proteus species NOT DETECTED NOT DETECTED Final  ? Salmonella species NOT DETECTED NOT DETECTED Final  ? Serratia marcescens NOT DETECTED NOT DETECTED Final  ? Haemophilus influenzae NOT DETECTED NOT DETECTED Final  ? Neisseria meningitidis NOT DETECTED NOT DETECTED Final  ? Pseudomonas aeruginosa NOT DETECTED NOT DETECTED Final  ? Stenotrophomonas maltophilia NOT DETECTED NOT DETECTED Final  ? Candida albicans NOT DETECTED NOT DETECTED Final  ? Candida auris NOT DETECTED NOT DETECTED Final  ? Candida glabrata NOT DETECTED NOT DETECTED Final  ? Candida krusei NOT DETECTED NOT DETECTED Final  ? Candida parapsilosis NOT  DETECTED NOT DETECTED Final  ? Candida tropicalis NOT DETECTED NOT DETECTED Final  ? Cryptococcus neoformans/gattii NOT DETECTED NOT DETECTED Final  ?  Comment: Performed at [facility], [address]

## 2021-11-29 NOTE — Plan of Care (Signed)
  Problem: Nutrition: Goal: Adequate nutrition will be maintained Outcome: Progressing   Problem: Coping: Goal: Level of anxiety will decrease Outcome: Progressing   Problem: Elimination: Goal: Will not experience complications related to urinary retention Outcome: Progressing   Problem: Pain Managment: Goal: General experience of comfort will improve Outcome: Progressing   

## 2021-11-29 NOTE — Progress Notes (Signed)
[facility] ? ?PROGRESS NOTE ? ?Valma CavaSam Raju ZOX:[id number] DOB: 08/01/1956 DOA: 11/22/2021 ?PCP: Georgina QuintSagardia, [name], MD ? ? ?Brief HPI:   ?66 year old male with medical history of diabetes mellitus type 2 presented to the ED with complaints of productive cough, subjective fever, chills, myalgia, severe arthralgia, new onset erythema and swelling of the left side of the base of the neck.  CTA showed no PE, confirmed right middle lobe pneumonia and bibasilar airspace opacities consistent with multifocal pneumonia and left sternoclavicular joint inflammatory changes with scattered foci of air within the soft tissue and sternoclavicular joint space compatible with left localized sternoclavicular septic arthritis/cellulitis, without evidence of drainable fluid collection or large joint effusion.  Subsequent BC ID and urine antigen positive for strep.  Antibiotics were narrowed down to ceftriaxone.  Newly diagnosed right upper arm SVT and left lower extremity DVT, briefly on IV heparin transition to Eliquis. ? ?MRI right shoulder findings suspicious for septic arthritis involving AC joint and glenohumeral joint.  Severe myositis and pyomyositis involving the subscapularis muscle, the anterior deltoid muscle and distal aspect of pectoralis muscle.  There is also myositis involving the infraspinatus muscle.   ? ? ?Subjective  ? ?Patient seen and examined, still complains of pain in the right shoulder and also Pain in the left knee. ? ? Assessment/Plan:  ? ?Severe sepsis ?-Due to streptococcal multifocal pneumonia/bacteremia ?-Patient presented with sepsis criteria including tachypnea, tachycardia, fever ?-CT chest confirmed multifocal pneumonia ?-Patient empirically started on IV vancomycin, ceftriaxone and Zithromax ?-BC ID and urine was positive.  To coccus ?-Antibiotics narrowed to IV ceftriaxone and then changed to penicillin G on 4/10 ?-ID following ? ?Presumed left sternoclavicular joint septic arthritis ?-CTA  chest was concerning for septic arthritis ?-ED provider spoke to CT surgeon on-call, who felt that case and imaging were consistent with septic arthritis of left sternoclavicular joint and recommended medical management and IV antibiotics without need for surgical intervention ?-Per ultrasound, there was minimal fluid in the left [place] joint ?-ID recommends imaging next week ? ?Septic arthritis of right shoulder ?-MRI right shoulder was ordered as per ID recommendation ?-Which showed septic arthritis involving AC joint and glenohumeral joint-severe myositis and pyomyositis involving subscapularis muscle, anterior deltoid muscle and distal aspect of pectoralis muscle ?-There is also myositis involving the infraspinatus muscle ?-Orthopedics consulted, spoke with Dr. Magnus IvanBlackman; he will see patient.  He will need surgical intervention ? ?Left ankle swelling/left knee swelling ?-MRI of left ankle was unremarkable for joint involvement only showed soft tissue inflammation; consistent with cellulitis ?-Left knee is swollen and warm to touch; will likely need joint aspiration ? ?Community-acquired pneumonia ?-Started on antibiotics as above ? ?DVT ?-Right upper extremity superficial vein thrombosis involving cephalic vein ?-Left lower extremity age indeterminate DVT left popliteal vein acute DVT left gastrocnemius and left posterior tibial veins ?-Patient was briefly on IV heparin, transition to Eliquis ?-Due to complaint of bilateral leg pain and swelling, obtained RLE venous Dopplers which were negative for DVT or SVT ? ?Transaminitis ?-Likely in setting of severe sepsis ?-No GI symptoms ?-Hepatitis panel negative ?-Right upper quadrant ultrasound showed gallbladder sludge with no acute findings ?-We will follow LFTs ? ?Thrombocytopenia ?-Suspect chronic or acute on chronic ?-Platelet count in 2018 was 104 ? ?Diabetes mellitus type 2 ?-Oral hypoglycemics on hold ?-CBG has been elevated ?-Increase Semglee to 20 units subcu  daily, NovoLog meal coverage to 6 units 3 times daily ?-Change sliding scale to resistant ? ?Hypokalemia ?-Replete ? ?Hypophosphatemia ?-Replaced ? ? ? ? ? ?  Medications ? ?  ? apixaban  10 mg Oral BID  ? Followed by  ? [START ON 12/04/2021] apixaban  5 mg Oral BID  ? insulin aspart  0-20 Units Subcutaneous TID WC  ? insulin aspart  0-5 Units Subcutaneous QHS  ? insulin aspart  6 Units Subcutaneous TID WC  ? [START ON 11/30/2021] insulin glargine-yfgn  20 Units Subcutaneous Daily  ? lidocaine  1 patch Transdermal Q24H  ? polyethylene glycol  17 g Oral BID  ? Ensure Max Protein  11 oz Oral BID  ? senna-docusate  2 tablet Oral Daily  ? ? ? Data Reviewed:  ? ?CBG: ? ?Recent Labs  ?Lab 11/28/21 ?1645 11/28/21 ?1759 11/28/21 ?2057 11/29/21 ?6812 11/29/21 ?1656  ?GLUCAP 263* 264* 234* 241* 219*  ? ? ?SpO2: 96 %  ? ? ?Vitals:  ? 11/28/21 1351 11/28/21 2105 11/29/21 0432 11/29/21 1316  ?BP: 129/82 138/77 139/87 139/82  ?Pulse: (!) 104 95 97 (!) 101  ?Resp:  18 15 18   ?Temp: 99.9 ?F (37.7 ?C) 97.9 ?F (36.6 ?C) 99.1 ?F (37.3 ?C) 98.5 ?F (36.9 ?C)  ?TempSrc: Oral Oral Oral Oral  ?SpO2: 100% 96% 96% 96%  ?Weight:      ?Height:      ? ? ? ? ?Data Reviewed: ? ?Basic Metabolic Panel: ?Recent Labs  ?Lab 11/23/21 ?0423 11/24/21 ?0502 11/25/21 ?01/25/22 11/25/21 ?01/25/22 11/26/21 ?0358 11/27/21 ?0240 11/27/21 ?01/27/22 11/28/21 ?0320 11/29/21 ?01/29/22  ?NA 133* 133* 132*  --  133* 133*  --  133* 132*  ?K 3.4* 3.4* 3.4*  --  4.0 4.1  --  4.1 4.6  ?CL 98 99 100  --  98 98  --  99 97*  ?CO2 24 25 26   --  24 27  --  27 28  ?GLUCOSE 377* 250* 242*  --  159* 212*  --  255* 247*  ?BUN 28* 31* 26*  --  28* 18  --  13 12  ?CREATININE 1.14 0.92 0.65  --  0.70 0.59*  --  0.63 0.79  ?CALCIUM 7.8* 7.5* 7.6*  --  8.0* 7.8*  --  7.7* 8.0*  ?MG 2.7*  --   --  2.8*  --   --   --   --   --   ?PHOS 2.4* 2.0*  --   --  2.2*  --  3.4  --   --   ? ? ?CBC: ?Recent Labs  ?Lab 11/23/21 ?0423 11/24/21 ?0502 11/25/21 ?01/24/22 11/26/21 ?0358 11/27/21 ?0240 11/28/21 ?0320  11/29/21 ?01/28/22  ?WBC 8.5   < > 7.6 9.3 12.0* 12.3* 13.1*  ?NEUTROABS 7.4  --   --   --   --   --   --   ?HGB 13.7   < > 13.9 14.9 14.9 12.8* 13.1  ?HCT 39.7   < > 40.1 43.6 41.8 36.2* 39.1  ?MCV 85.9   < > 85.7 86.3 84.3 84.4 87.9  ?PLT 89*   < > 100* 127* 155 191 234  ? < > = values in this interval not displayed.  ? ? ?LFT ?Recent Labs  ?Lab 11/25/21 ?8466 11/26/21 ?0358 11/27/21 ?0240 11/28/21 ?0320 11/29/21 ?01/28/22  ?AST 116* 134* 125* 83* 56*  ?ALT 66* 79* 84* 68* 57*  ?ALKPHOS 265* 342* 349* 334* 315*  ?BILITOT 1.3* 1.7* 1.4* 1.5* 1.2  ?PROT 5.5* 6.0* 5.7* 5.4* 5.7*  ?ALBUMIN 2.1* 2.2* 2.0* 1.8* 1.9*  ? ?  ?Antibiotics: ?Anti-infectives (From admission, onward)  ? ?  Start     Dose/Rate Route Frequency Ordered Stop  ? 11/27/21 1000  penicillin G potassium 12 Million Units in dextrose 5 % 500 mL continuous infusion       ? 12 Million Units ?41.7 mL/hr over 12 Hours Intravenous Every 12 hours 11/27/21 0901    ? 11/23/21 2000  vancomycin (VANCOREADY) IVPB 1750 mg/350 mL  Status:  Discontinued       ? 1,750 mg ?175 mL/hr over 120 Minutes Intravenous Every 24 hours 11/22/21 1943 11/23/21 0931  ? 11/22/21 2300  cefTRIAXone (ROCEPHIN) 2 g in sodium chloride 0.9 % 100 mL IVPB  Status:  Discontinued       ? 2 g ?200 mL/hr over 30 Minutes Intravenous Every 24 hours 11/22/21 1923 11/27/21 0901  ? 11/22/21 2000  azithromycin (ZITHROMAX) 500 mg in sodium chloride 0.9 % 250 mL IVPB  Status:  Discontinued       ? 500 mg ?250 mL/hr over 60 Minutes Intravenous Every 24 hours 11/22/21 1923 11/23/21 0931  ? 11/22/21 1830  ceFEPIme (MAXIPIME) 2 g in sodium chloride 0.9 % 100 mL IVPB       ? 2 g ?200 mL/hr over 30 Minutes Intravenous  Once 11/22/21 1825 11/22/21 1949  ? 11/22/21 1830  vancomycin (VANCOREADY) IVPB 1750 mg/350 mL       ? 1,750 mg ?175 mL/hr over 120 Minutes Intravenous  Once 11/22/21 1825 11/22/21 2147  ? ?  ? ? ? ?DVT prophylaxis: Apixaban ? ?Code Status: Full code ? ?Family Communication: No family at  bedside ? ? ?CONSULTS infectious disease ? ? ?Objective  ? ? ?Physical Examination: ? ? ?General-appears in no acute distress ?Heart-S1-S2, regular, no murmur auscultated ?Lungs-clear to auscultation bilaterally, no wheezing o

## 2021-11-29 NOTE — Consult Note (Signed)
Reason for Consult: 1) Right shoulder pain and swelling with questionable septic joint and septic bursa ?2) Left knee swelling with questionable septic joint ?Referring Physician:  Dr. Sharl Ma ? ?David Strong is an 66 y.o. male.  ?HPI: The patient is a very pleasant 66 year old gentleman who Dr. Sharl Ma consulted me about this evening as a relates to right shoulder pain and swelling and left knee pain and swelling.  There is concerned about septic arthritis involving the shoulder joint on the right side of the [facility] joint.  I MRI showed evidence of myositis and fluid suggesting a septic bursitis and septic joint of the right shoulder.  Since go to be seeing the patient for the right shoulder Dr. Sharl Ma asked if I can see him for his left knee and I think this is reasonable.  He was going to order MRI of the left knee but I told him to hold off until I saw the patient.  He denies any previous joint complaints.  He has been in the hospital since a week ago when he was admitted with pneumonia.  He has had multiple joint pains since then and denies any injury or previous surgeries on his shoulder or his knee.  Apparently an MRI was obtained of the ankle which I reviewed and it was negative for any type of infectious process other than possible cellulitis.  The patient has eaten this evening and is sitting up at the bedside with family in the room.  He does not appear uncomfortable or septic.  His right arm is obviously swollen in terms of distal forearm and hand comparing this to the other side on just inspection alone. ? ?Past Medical History:  ?Diagnosis Date  ? Diabetes mellitus without complication (HCC)   ? ? ?History reviewed. No pertinent surgical history. ? ?Family History  ?Adopted: Yes  ? ? ?Social History:  reports that he has quit smoking. He has never used smokeless tobacco. He reports current alcohol use. He reports that he does not use drugs. ? ?Allergies:  ?Allergies  ?Allergen Reactions  ? Codeine Itching  ?  Glipizide Er [Glipizide]   ?  SOB, malaise. He tolerates XL without issues.  ? Metformin And Related Rash  ? ? ?Medications: I have reviewed the patient's current medications. ? ?Results for orders placed or performed during the hospital encounter of 11/22/21 (from the past 48 hour(s))  ?Glucose, capillary     Status: Abnormal  ? Collection Time: 11/27/21 10:55 PM  ?Result Value Ref Range  ? Glucose-Capillary 280 (H) 70 - 99 mg/dL  ?  Comment: Glucose reference range applies only to samples taken after fasting for at least 8 hours.  ?CBC     Status: Abnormal  ? Collection Time: 11/28/21  3:20 AM  ?Result Value Ref Range  ? WBC 12.3 (H) 4.0 - 10.5 K/uL  ? RBC 4.29 4.22 - 5.81 MIL/uL  ? Hemoglobin 12.8 (L) 13.0 - 17.0 g/dL  ? HCT 36.2 (L) 39.0 - 52.0 %  ? MCV 84.4 80.0 - 100.0 fL  ? MCH 29.8 26.0 - 34.0 pg  ? MCHC 35.4 30.0 - 36.0 g/dL  ? RDW 14.4 11.5 - 15.5 %  ? Platelets 191 150 - 400 K/uL  ? nRBC 0.0 0.0 - 0.2 %  ?  Comment: Performed at [facility], [address], [place], [place] [address]  ?Comprehensive metabolic panel     Status: Abnormal  ? Collection Time: 11/28/21  3:20 AM  ?Result Value  Ref Range  ? Sodium 133 (L) 135 - 145 mmol/L  ? Potassium 4.1 3.5 - 5.1 mmol/L  ? Chloride 99 98 - 111 mmol/L  ? CO2 27 22 - 32 mmol/L  ? Glucose, Bld 255 (H) 70 - 99 mg/dL  ?  Comment: Glucose reference range applies only to samples taken after fasting for at least 8 hours.  ? BUN 13 8 - 23 mg/dL  ? Creatinine, Ser 0.63 0.61 - 1.24 mg/dL  ? Calcium 7.7 (L) 8.9 - 10.3 mg/dL  ? Total Protein 5.4 (L) 6.5 - 8.1 g/dL  ? Albumin 1.8 (L) 3.5 - 5.0 g/dL  ? AST 83 (H) 15 - 41 U/L  ? ALT 68 (H) 0 - 44 U/L  ? Alkaline Phosphatase 334 (H) 38 - 126 U/L  ? Total Bilirubin 1.5 (H) 0.3 - 1.2 mg/dL  ? GFR, Estimated >60 >60 mL/min  ?  Comment: (NOTE) ?Calculated using the CKD-EPI Creatinine Equation (2021) ?  ? Anion gap 7 5 - 15  ?  Comment: Performed at [facility], [address]  [place], [place] [address]  ?Glucose, capillary     Status: Abnormal  ? Collection Time: 11/28/21  7:26 AM  ?Result Value Ref Range  ? Glucose-Capillary 261 (H) 70 - 99 mg/dL  ?  Comment: Glucose reference range applies only to samples taken after fasting for at least 8 hours.  ?Glucose, capillary     Status: Abnormal  ? Collection Time: 11/28/21 11:30 AM  ?Result Value Ref Range  ? Glucose-Capillary 228 (H) 70 - 99 mg/dL  ?  Comment: Glucose reference range applies only to samples taken after fasting for at least 8 hours.  ?Glucose, capillary     Status: Abnormal  ? Collection Time: 11/28/21  4:45 PM  ?Result Value Ref Range  ? Glucose-Capillary 263 (H) 70 - 99 mg/dL  ?  Comment: Glucose reference range applies only to samples taken after fasting for at least 8 hours.  ?Glucose, capillary     Status: Abnormal  ? Collection Time: 11/28/21  5:59 PM  ?Result Value Ref Range  ? Glucose-Capillary 264 (H) 70 - 99 mg/dL  ?  Comment: Glucose reference range applies only to samples taken after fasting for at least 8 hours.  ?Glucose, capillary     Status: Abnormal  ? Collection Time: 11/28/21  8:57 PM  ?Result Value Ref Range  ? Glucose-Capillary 234 (H) 70 - 99 mg/dL  ?  Comment: Glucose reference range applies only to samples taken after fasting for at least 8 hours.  ?CBC     Status: Abnormal  ? Collection Time: 11/29/21  5:17 AM  ?Result Value Ref Range  ? WBC 13.1 (H) 4.0 - 10.5 K/uL  ? RBC 4.45 4.22 - 5.81 MIL/uL  ? Hemoglobin 13.1 13.0 - 17.0 g/dL  ? HCT 39.1 39.0 - 52.0 %  ? MCV 87.9 80.0 - 100.0 fL  ? MCH 29.4 26.0 - 34.0 pg  ? MCHC 33.5 30.0 - 36.0 g/dL  ? RDW 14.5 11.5 - 15.5 %  ? Platelets 234 150 - 400 K/uL  ? nRBC 0.0 0.0 - 0.2 %  ?  Comment: Performed at [facility], [address] [place], [place] [address]  ?Comprehensive metabolic panel     Status: Abnormal  ? Collection Time: 11/29/21  5:17 AM  ?Result Value Ref Range  ? Sodium 132 (L) 135 - 145 mmol/L  ? Potassium 4.6 3.5 - 5.1  mmol/L  ?  Chloride 97 (L) 98 - 111 mmol/L  ? CO2 28 22 - 32 mmol/L  ? Glucose, Bld 247 (H) 70 - 99 mg/dL  ?  Comment: Glucose reference range applies only to samples taken after fasting for at least 8 hours.  ? BUN 12 8 - 23 mg/dL  ? Creatinine, Ser 0.79 0.61 - 1.24 mg/dL  ? Calcium 8.0 (L) 8.9 - 10.3 mg/dL  ? Total Protein 5.7 (L) 6.5 - 8.1 g/dL  ? Albumin 1.9 (L) 3.5 - 5.0 g/dL  ? AST 56 (H) 15 - 41 U/L  ? ALT 57 (H) 0 - 44 U/L  ? Alkaline Phosphatase 315 (H) 38 - 126 U/L  ? Total Bilirubin 1.2 0.3 - 1.2 mg/dL  ? GFR, Estimated >60 >60 mL/min  ?  Comment: (NOTE) ?Calculated using the CKD-EPI Creatinine Equation (2021) ?  ? Anion gap 7 5 - 15  ?  Comment: Performed at [facility], [address], [place], [place] [address]  ?Glucose, capillary     Status: Abnormal  ? Collection Time: 11/29/21  7:28 AM  ?Result Value Ref Range  ? Glucose-Capillary 241 (H) 70 - 99 mg/dL  ?  Comment: Glucose reference range applies only to samples taken after fasting for at least 8 hours.  ?Glucose, capillary     Status: Abnormal  ? Collection Time: 11/29/21  4:56 PM  ?Result Value Ref Range  ? Glucose-Capillary 219 (H) 70 - 99 mg/dL  ?  Comment: Glucose reference range applies only to samples taken after fasting for at least 8 hours.  ? Comment 1 Notify RN   ? ? ?MR Shoulder Right W Wo Contrast ? ?Result Date: 11/28/2021 ?CLINICAL DATA:  Severe shoulder pain clinical suspicion for septic arthritis. EXAM: MRI OF THE RIGHT SHOULDER WITHOUT AND WITH CONTRAST TECHNIQUE: Multiplanar, multisequence MR imaging of the right shoulder was performed before and after the administration of intravenous contrast. CONTRAST:  79mL GADAVIST GADOBUTROL 1 MMOL/ML IV SOLN, 2mL GADAVIST GADOBUTROL 1 MMOL/ML IV SOLN COMPARISON:  None. FINDINGS: Examination is moderately limited due to motion artifact. Rotator cuff: Significant rotator cuff tendinopathy/tendinosis which could be septic in nature. No full-thickness retracted rotator cuff tear.  Muscles: Severe myositis and pyomyositis involving the subscapularis muscle. There is also moderate myositis involving the infraspinatus muscle. Biceps long head: Intact. Moderate tendinopathy involving the intra-articu

## 2021-11-29 NOTE — Progress Notes (Signed)
Occupational Therapy Treatment ?Patient Details ?Name: David Strong ?MRN: [id number] ?DOB: 1955-10-11 ?Today's Date: 11/29/2021 ? ? ?History of present illness 66 year old male with medical history significant for type II DM, presented to the ED with a couple days history of progressive productive cough, subjective fevers, chills, myalgia, severe arthralgia and new onset erythema and swelling on the left side of the base of the neck.  CTA showed no PE, confirmed RML and bibasilar airspace opacities consistent with multifocal pneumonia and also left sternoclavicular joint inflammatory changes with scattered foci of air within the soft tissues and sternoclavicular joint space compatible with localized left sternoclavicular septic arthritis/cellulitis, without evidence of drainable fluid collection or large joint effusion.  Subsequent BCID and urine antigen positive for strep.  Antibiotics narrowed to IV ceftriaxone.  Admitted for severe sepsis, POA due to streptococcal pneumonia with bacteremia, left sternoclavicular joint septic arthritis and other large joint arthralgia/arthritis (right shoulder, left knee).  IR planning ultrasound-guided possible aspiration of left [place] joint. +RUE superficial thrombosis and L LE DVT ?  ?OT comments ? Limited treatment today due to complaints of pain 8/10 in right shoulder and legs. Instructed patient in elbow, wrist and hand exercises for RUE to maintain ROM and reduce edema. Patient did not tolerate shoulder PROM due to pain. Instructed on LE movement as well due to edema in lower extremities. Patient verbalized understanding. Plan is for patient to have I & D of right shoulder. Will plan to follow up after surgery. Also updated POC as therapist expects patient to require more assistance after surgery. Will continue to follow.  ? ?Recommendations for follow up therapy are one component of a multi-disciplinary discharge planning process, led by the attending physician.  Recommendations  may be updated based on patient status, additional functional criteria and insurance authorization. ?   ?Follow Up Recommendations ? Home health OT  ?  ?Assistance Recommended at Discharge Frequent or constant Supervision/Assistance  ?Patient can return home with the following ? A little help with bathing/dressing/bathroom;Assistance with cooking/housework;A lot of help with bathing/dressing/bathroom;A lot of help with walking and/or transfers ?  ?Equipment Recommendations ? Other (comment) (TBD)  ?  ?Recommendations for Other Services   ? ?  ?Precautions / Restrictions Precautions ?Precautions: Fall ?Precaution Comments: mild buckling at R knee with ambulation ?Restrictions ?Weight Bearing Restrictions: No  ? ? ?  ? ?Mobility Bed Mobility ?  ?  ?  ?  ?  ?  ?  ?  ?  ? ?Transfers ?  ?  ?  ?  ?  ?  ?  ?  ?  ?  ?  ?  ?Balance   ?  ?  ?  ?  ?  ?  ?  ?  ?  ?  ?  ?  ?  ?  ?  ?  ?  ?  ?   ? ?ADL either performed or assessed with clinical judgement  ? ?ADL   ?  ?  ?  ?  ?  ?  ?  ?  ?  ?  ?  ?  ?  ?  ?  ?  ?  ?  ?  ?  ?  ? ?Extremity/Trunk Assessment   ?  ?  ?  ?  ?  ? ?Vision Baseline Vision/History: 1 Wears glasses ?  ?  ?Perception   ?  ?Praxis   ?  ? ?Cognition Arousal/Alertness: Awake/alert ?Behavior During Therapy: [facility] for tasks assessed/performed ?Overall Cognitive Status: Within Functional  Limits for tasks assessed ?  ?  ?  ?  ?  ?  ?  ?  ?  ?  ?  ?  ?  ?  ?  ?  ?  ?  ?  ?   ?Exercises Other Exercises ?Other Exercises: AROM of elbow, wrist and hand to maintain ROM and reduce edema, minimal gentle ROM at shoulder ?Other Exercises: Encouraged ankle pumps and LE movement to improve edema ? ?  ?Shoulder Instructions   ? ? ?  ?General Comments    ? ? ?Pertinent Vitals/ Pain       Pain Assessment ?Pain Assessment: 0-10 ?Pain Score: 8  ?Pain Location: Back, right shoulder, left knee ?Pain Descriptors / Indicators: Discomfort, Grimacing, Tightness ?Pain Intervention(s): Limited activity within patient's tolerance ? ?Home  Living   ?  ?  ?  ?  ?  ?  ?  ?  ?  ?  ?  ?  ?  ?  ?  ?  ?  ?  ? ?  ?Prior Functioning/Environment    ?  ?  ?  ?   ? ?Frequency ? Min 2X/week  ? ? ? ? ?  ?Progress Toward Goals ? ?OT Goals(current goals can now be found in the care plan section) ? Progress towards OT goals: Progressing toward goals ? ?Acute Rehab OT Goals ?Patient Stated Goal: use arm for work ?OT Goal Formulation: With patient ?Time For Goal Achievement: 12/10/21 ?Potential to Achieve Goals: Good  ?Plan Discharge plan needs to be updated   ? ?Co-evaluation ? ? ?   ?  ?  ?  ?  ? ?  ?AM-PAC OT "6 Clicks" Daily Activity     ?Outcome Measure ? ? Help from another person eating meals?: A Little ?Help from another person taking care of personal grooming?: A Little ?Help from another person toileting, which includes using toliet, bedpan, or urinal?: A Lot ?Help from another person bathing (including washing, rinsing, drying)?: A Lot ?Help from another person to put on and taking off regular upper body clothing?: A Lot ?Help from another person to put on and taking off regular lower body clothing?: A Lot ?6 Click Score: 14 ? ?  ?End of Session Equipment Utilized During Treatment: Rolling walker (2 wheels) ? ?OT Visit Diagnosis: Pain ?Pain - Right/Left: Right ?Pain - part of body: Shoulder ?  ?Activity Tolerance Patient limited by pain ?  ?Patient Left in chair;with call bell/phone within reach ?  ?Nurse Communication   ?  ? ?   ? ?Time: 4580-9983 ?OT Time Calculation (min): 11 min ? ?Charges: OT General Charges ?$OT Visit: 1 Visit ?OT Treatments ?$Therapeutic Exercise: 8-22 mins ? ?Kylon Philbrook, OTR/L ?Acute Care Rehab Services  ?Office [phone] ?Pager: [phone]  ? ?Jaylinn Hellenbrand L Case Vassell ?11/29/2021, 9:25 AM ?

## 2021-11-30 ENCOUNTER — Inpatient Hospital Stay (HOSPITAL_COMMUNITY): Payer: Medicare Other

## 2021-11-30 ENCOUNTER — Ambulatory Visit: Payer: Medicare Other | Admitting: Emergency Medicine

## 2021-11-30 DIAGNOSIS — J189 Pneumonia, unspecified organism: Secondary | ICD-10-CM | POA: Diagnosis not present

## 2021-11-30 DIAGNOSIS — I82402 Acute embolism and thrombosis of unspecified deep veins of left lower extremity: Secondary | ICD-10-CM | POA: Diagnosis not present

## 2021-11-30 DIAGNOSIS — R6520 Severe sepsis without septic shock: Secondary | ICD-10-CM | POA: Diagnosis not present

## 2021-11-30 DIAGNOSIS — R600 Localized edema: Secondary | ICD-10-CM | POA: Diagnosis not present

## 2021-11-30 DIAGNOSIS — A419 Sepsis, unspecified organism: Secondary | ICD-10-CM | POA: Diagnosis not present

## 2021-11-30 DIAGNOSIS — L0390 Cellulitis, unspecified: Secondary | ICD-10-CM | POA: Diagnosis not present

## 2021-11-30 DIAGNOSIS — D696 Thrombocytopenia, unspecified: Secondary | ICD-10-CM | POA: Diagnosis not present

## 2021-11-30 LAB — HEPATITIS PANEL, ACUTE
HCV Ab: NONREACTIVE
Hep A IgM: NONREACTIVE
Hep B C IgM: NONREACTIVE
Hepatitis B Surface Ag: NONREACTIVE

## 2021-11-30 LAB — GLUCOSE, CAPILLARY
Glucose-Capillary: 251 mg/dL — ABNORMAL HIGH (ref 70–99)
Glucose-Capillary: 317 mg/dL — ABNORMAL HIGH (ref 70–99)
Glucose-Capillary: 136 mg/dL — ABNORMAL HIGH (ref 70–99)
Glucose-Capillary: 111 mg/dL — ABNORMAL HIGH (ref 70–99)

## 2021-11-30 IMAGING — CT CT ABD-PELV W/ CM
2 of 5 series · 15 of 46 positions shown, 17 images · IV contrast (agent unspecified)
Comparison: CT angiogram chest [DATE].

CLINICAL DATA: Abdominal pain, evaluate for metastatic disease.

EXAM:
CT ABDOMEN AND PELVIS WITH CONTRAST
TECHNIQUE: Multidetector CT imaging of the abdomen and pelvis was performed
using the standard protocol following bolus administration of
intravenous contrast.

[Series 2: axial st · axial · 0.84mm/px · z∈[-604,-179]mm · 12 of 101 slices shown, 14 images]
[im 8/101  soft-tissue]
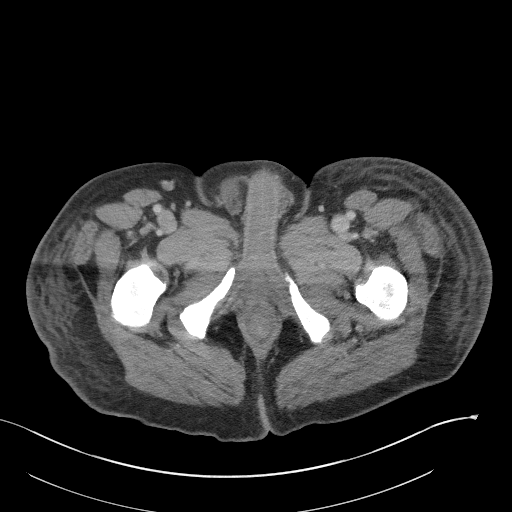
[im 8/101  bone]
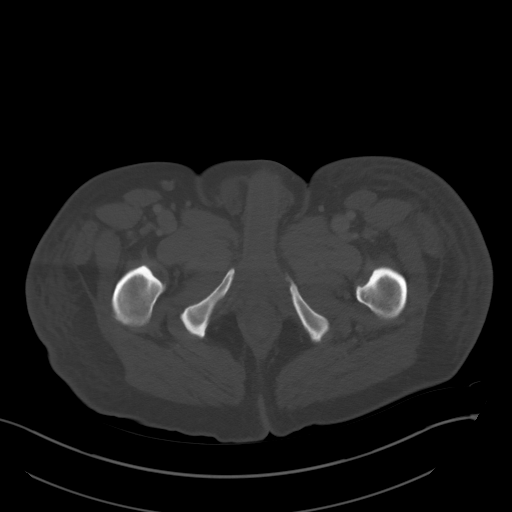
[im 15/101  soft-tissue]
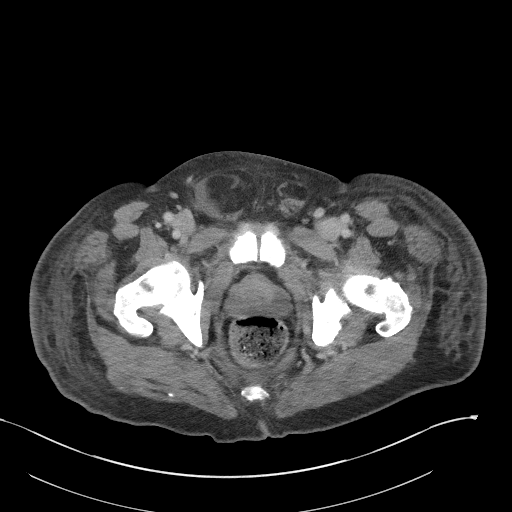
[im 22/101  soft-tissue]
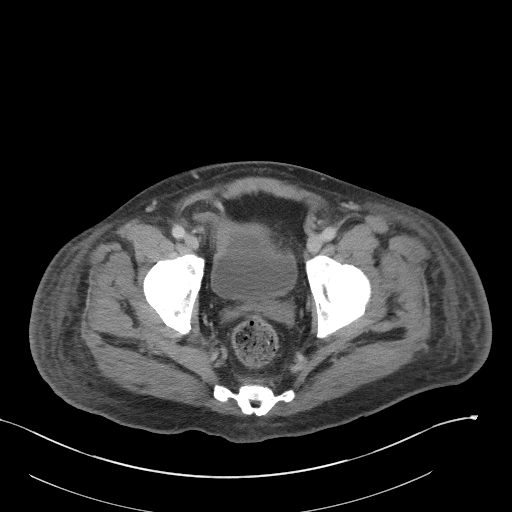
[im 29/101  soft-tissue]
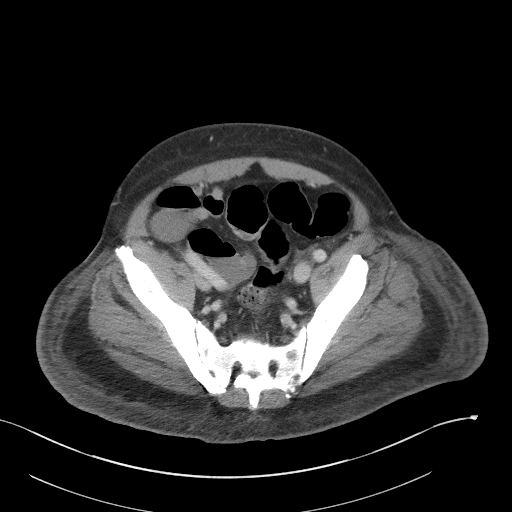
[im 36/101  soft-tissue]
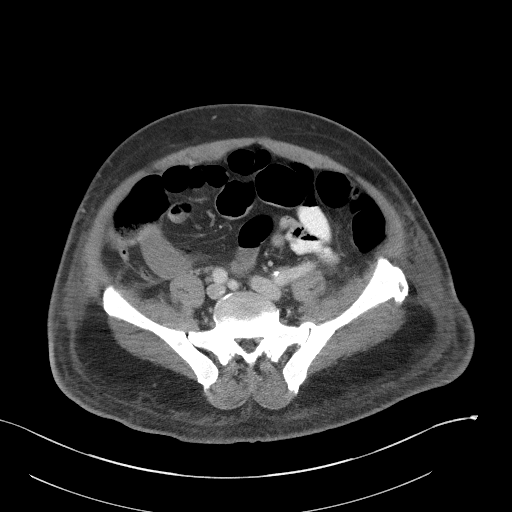
[im 43/101  soft-tissue]
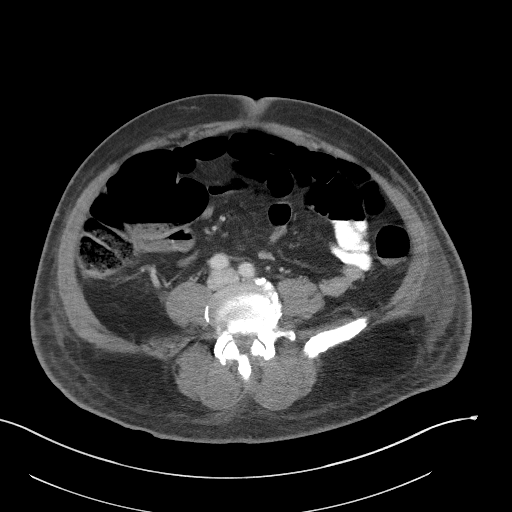
[im 58/101  soft-tissue]
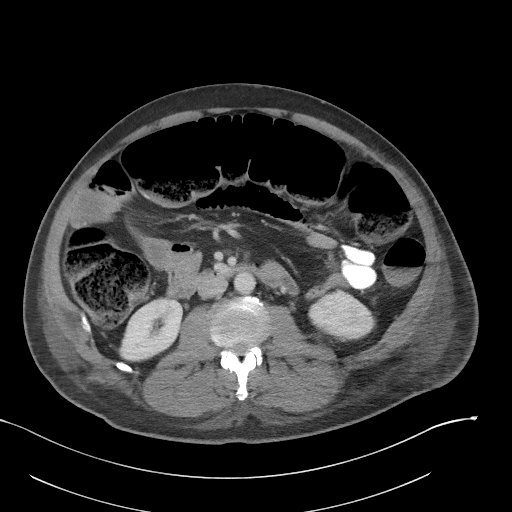
[im 65/101  soft-tissue]
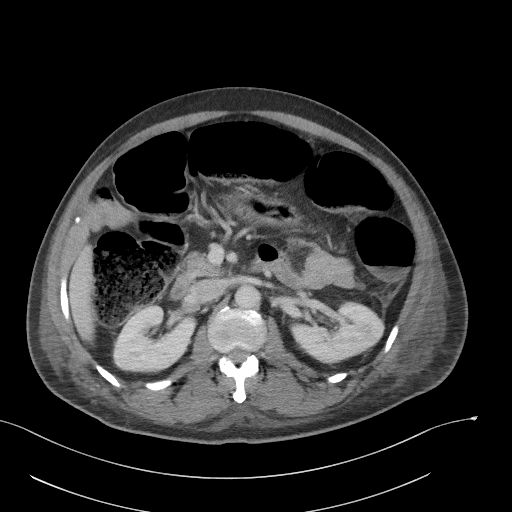
[im 72/101  soft-tissue]
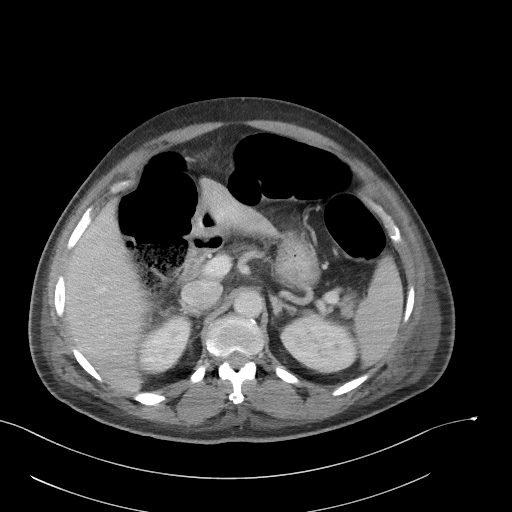
[im 72/101  bone]
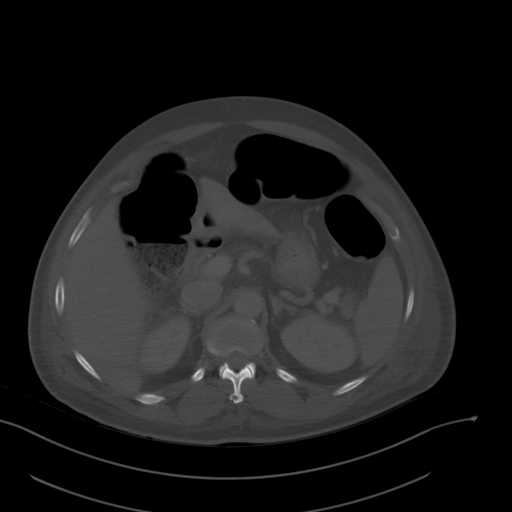
[im 79/101  soft-tissue]
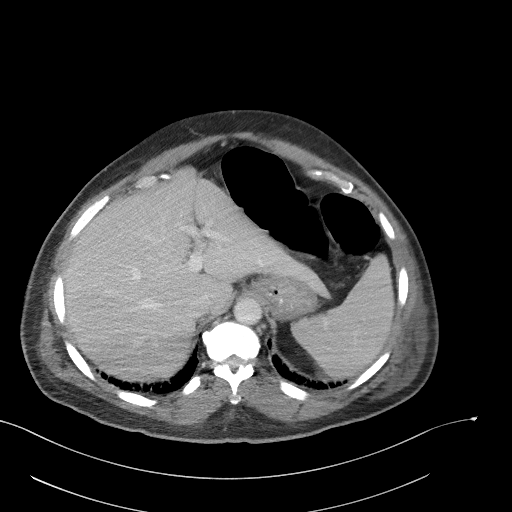
[im 86/101  soft-tissue]
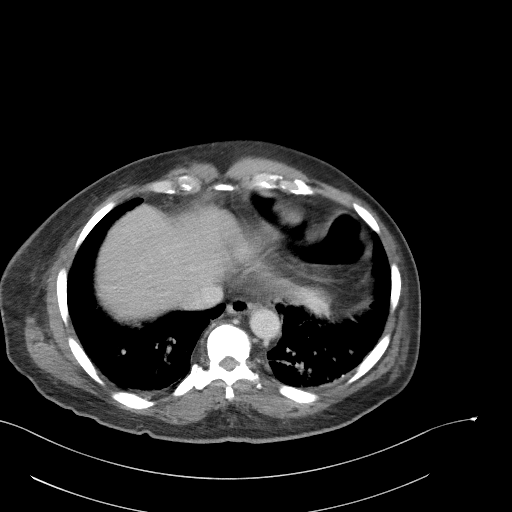
[im 93/101  soft-tissue]
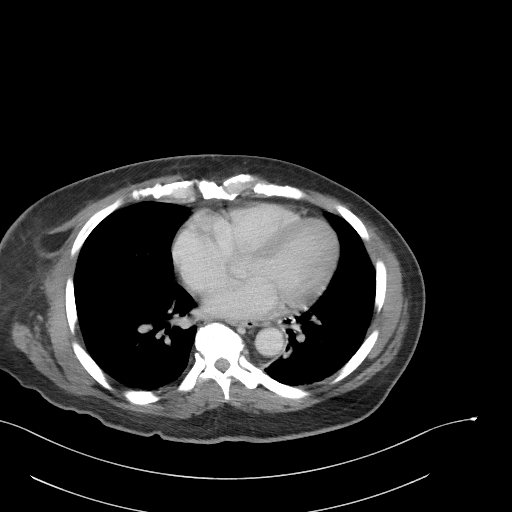

[Series 5: coronal st · coronal · 0.76mm/px · 3 of 105 slices shown]
[im 35/105  soft-tissue]
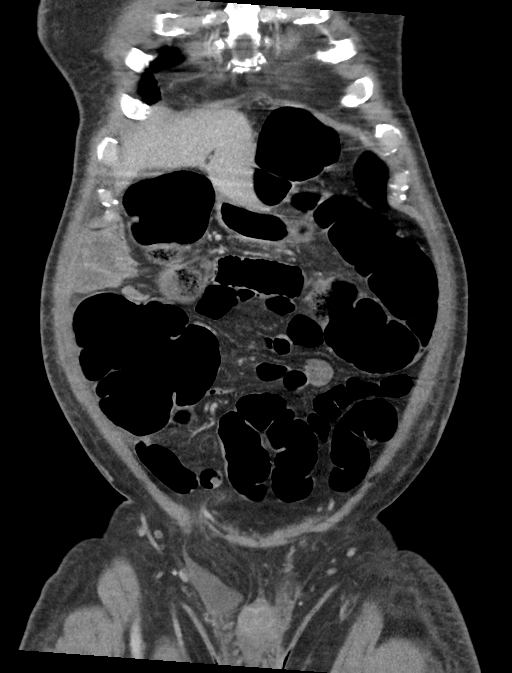
[im 47/105  soft-tissue]
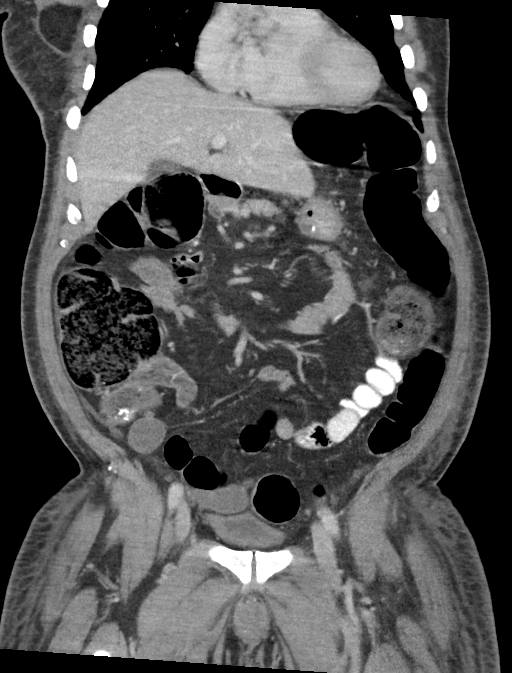
[im 58/105  soft-tissue]
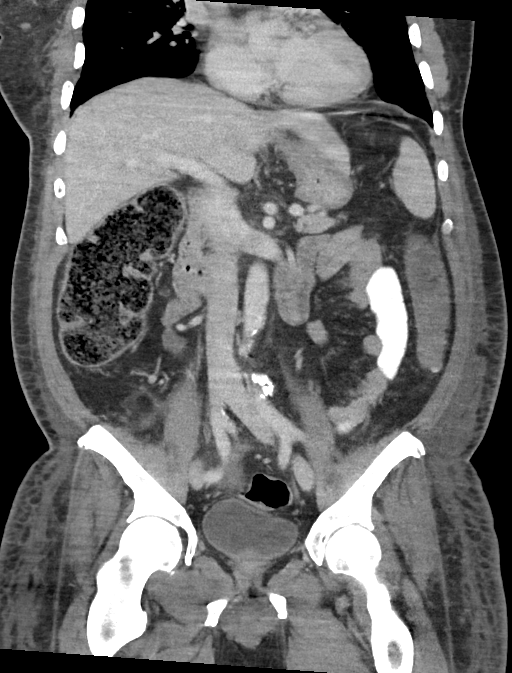

[15 of 46 positions shown; findings below may reference images not displayed]

RADIATION DOSE REDUCTION: This exam was performed according to the
departmental dose-optimization program which includes automated
exposure control, adjustment of the mA and/or kV according to
patient size and/or use of iterative reconstruction technique.

CONTRAST:  100mL OMNIPAQUE IOHEXOL 300 MG/ML  SOLN
FINDINGS: Lower chest: There is atelectasis in the lung bases.

Hepatobiliary: No focal liver abnormality is seen. No gallstones,
gallbladder wall thickening, or biliary dilatation.

Pancreas: Unremarkable. No pancreatic ductal dilatation or
surrounding inflammatory changes.

Spleen: Normal in size without focal abnormality.

Adrenals/Urinary Tract: Adrenal glands are unremarkable. Kidneys are
normal, without renal calculi, focal lesion, or hydronephrosis.
Bladder is unremarkable.

Stomach/Bowel: There is diffuse gaseous distention of the colon with
gradual decompression in the sigmoid region. Air-fluid levels are
seen throughout the colon. Stomach is nondilated. Small bowel loops
are within normal limits. The appendix is within normal limits. No
focal wall thickening or inflammation identified.

Vascular/Lymphatic: Aortic atherosclerosis. No enlarged abdominal or
pelvic lymph nodes.

Reproductive: Prostate is unremarkable.

Other: There is a small right inguinal hernia containing fat and
small amount of free fluid. There is minimal presacral edema and
mild body wall edema. There is no hernia identified.

Musculoskeletal: No acute or significant osseous findings.
IMPRESSION: 1. Findings most compatible with colonic ileus.
2. No evidence for metastatic disease in the abdomen or pelvis.
3. Mild body wall edema.
4. Right inguinal hernia containing fat and fluid.

## 2021-11-30 MED ORDER — IOHEXOL 9 MG/ML PO SOLN
ORAL | Status: AC
  Administered 2021-11-30: 500 mL via ORAL
  Filled 2021-11-30: qty 1000

## 2021-11-30 MED ORDER — IOHEXOL 300 MG/ML  SOLN
100.0000 mL | Freq: Once | INTRAMUSCULAR | Status: AC | PRN
  Administered 2021-11-30: 100 mL via INTRAVENOUS

## 2021-11-30 MED ORDER — ADULT MULTIVITAMIN W/MINERALS CH
1.0000 | ORAL_TABLET | Freq: Every day | ORAL | Status: DC
  Administered 2021-11-30 – 2021-12-18 (×17): 1 via ORAL
  Filled 2021-11-30 (×17): qty 1

## 2021-11-30 MED ORDER — IOHEXOL 9 MG/ML PO SOLN
500.0000 mL | ORAL | Status: AC
  Administered 2021-11-30: 500 mL via ORAL

## 2021-11-30 MED ORDER — SODIUM CHLORIDE (PF) 0.9 % IJ SOLN
INTRAMUSCULAR | Status: AC
  Filled 2021-11-30: qty 50

## 2021-11-30 NOTE — Progress Notes (Signed)
[facility] ? ?PROGRESS NOTE ? ?David Strong ZYS:[id number] DOB: 11/11/1955 DOA: 11/22/2021 ?PCP: Georgina QuintSagardia, [name], MD ? ? ?Brief HPI:   ?66 year old male with medical history of diabetes mellitus type 2 presented to the ED with complaints of productive cough, subjective fever, chills, myalgia, severe arthralgia, new onset erythema and swelling of the left side of the base of the neck.  CTA showed no PE, confirmed right middle lobe pneumonia and bibasilar airspace opacities consistent with multifocal pneumonia and left sternoclavicular joint inflammatory changes with scattered foci of air within the soft tissue and sternoclavicular joint space compatible with left localized sternoclavicular septic arthritis/cellulitis, without evidence of drainable fluid collection or large joint effusion.  Subsequent BC ID and urine antigen positive for strep.  Antibiotics were narrowed down to ceftriaxone.  Newly diagnosed right upper arm SVT and left lower extremity DVT, briefly on IV heparin transition to Eliquis. ? ?MRI right shoulder findings suspicious for septic arthritis involving AC joint and glenohumeral joint.  Severe myositis and pyomyositis involving the subscapularis muscle, the anterior deltoid muscle and distal aspect of pectoralis muscle.  There is also myositis involving the infraspinatus muscle.   ? ? ?Subjective  ? ?Patient seen and examined, concerned about swelling in his right upper extremity and both lower extremities. ? ? Assessment/Plan:  ? ?Severe sepsis ?-Due to streptococcal multifocal pneumonia/bacteremia ?-Patient presented with sepsis criteria including tachypnea, tachycardia, fever ?-CT chest confirmed multifocal pneumonia ?-Patient empirically started on IV vancomycin, ceftriaxone and Zithromax ?-BC ID and urine was positive.  To coccus ?-Antibiotics narrowed to IV ceftriaxone and then changed to penicillin G on 4/10 ?-ID following ? ?Presumed left sternoclavicular joint septic  arthritis ?-CTA chest was concerning for septic arthritis ?-ED provider spoke to CT surgeon on-call, who felt that case and imaging were consistent with septic arthritis of left sternoclavicular joint and recommended medical management and IV antibiotics without need for surgical intervention ?-Per ultrasound, there was minimal fluid in the left  joint ?-ID recommends imaging next week ? ?Septic arthritis of right shoulder ?-MRI right shoulder was ordered as per ID recommendation ?-Which showed septic arthritis involving AC joint and glenohumeral joint-severe myositis and pyomyositis involving subscapularis muscle, anterior deltoid muscle and distal aspect of pectoralis muscle ?-There is also myositis involving the infraspinatus muscle ?-Orthopedics consulted, spoke with Dr. Magnus IvanBlackman; ?-He saw patient and could not aspirate anything from the right shoulder  ?-No surgical intervention recommended ? ?Left ankle swelling/left knee swelling ?-MRI of left ankle was unremarkable for joint involvement only showed soft tissue inflammation; consistent with cellulitis ?-Left knee is swollen and warm to touch;  ?-Joint aspiration was attempted however no fluid obtained ?-Not consistent with septic arthritis ? ?Community-acquired pneumonia ?-Started on antibiotics as above ? ?DVT ?-Right upper extremity superficial vein thrombosis involving cephalic vein ?-Left lower extremity age indeterminate DVT left popliteal vein acute DVT left gastrocnemius and left posterior tibial veins ?-Patient was briefly on IV heparin, transition to Eliquis ?-Due to complaint of bilateral leg pain and swelling, obtained RLE venous Dopplers which were negative for DVT or SVT ?-I have consulted oncology for further evaluation for this patient with multiple DVTs, hypoalbuminemia, transaminitis ?-We will check CT abdomen/pelvis for underlying metastatic process ? ?Transaminitis ?-Likely in setting of severe sepsis ?-No GI symptoms ?-Hepatitis panel  negative ?-Right upper quadrant ultrasound showed gallbladder sludge with no acute findings ?-LFTs have been elevated; unclear etiology ?-We will consult gastroenterology ? ?Anasarca ?-Patient has swelling of right upper extremity and both lower extremities ?-  Albumin is down to 1.9 ?-We will get dietitian consult for hypoalbuminemia ? ?Thrombocytopenia ?-Suspect chronic or acute on chronic ?-Platelet count in 2018 was 104 ? ?Diabetes mellitus type 2 ?-Oral hypoglycemics on hold ?-CBG has been elevated ?-Increase Semglee to 20 units subcu daily, NovoLog meal coverage to 6 units 3 times daily ?-Change sliding scale to resistant ? ?Hypokalemia ?-Replete ? ?Hypophosphatemia ?-Replaced ? ? ? ? ? ?Medications ? ?  ? apixaban  10 mg Oral BID  ? Followed by  ? [START ON 12/04/2021] apixaban  5 mg Oral BID  ? insulin aspart  0-20 Units Subcutaneous TID WC  ? insulin aspart  0-5 Units Subcutaneous QHS  ? insulin aspart  6 Units Subcutaneous TID WC  ? insulin glargine-yfgn  20 Units Subcutaneous Daily  ? lidocaine  1 patch Transdermal Q24H  ? multivitamin with minerals  1 tablet Oral Daily  ? polyethylene glycol  17 g Oral BID  ? Ensure Max Protein  11 oz Oral BID  ? senna-docusate  2 tablet Oral Daily  ? ? ? Data Reviewed:  ? ?CBG: ? ?Recent Labs  ?Lab 11/29/21 ?1147 11/29/21 ?1656 11/29/21 ?2109 11/30/21 ?0755 11/30/21 ?1254  ?GLUCAP 240* 219* 138* 251* 317*  ? ? ?SpO2: 93 %  ? ? ?Vitals:  ? 11/29/21 0432 11/29/21 1316 11/29/21 2111 11/30/21 0501  ?BP: 139/87 139/82 139/82 137/83  ?Pulse: 97 (!) 101 (!) 101 (!) 103  ?Resp: 15 18 16 15   ?Temp: 99.1 ?F (37.3 ?C) 98.5 ?F (36.9 ?C) 98.8 ?F (37.1 ?C) 99.4 ?F (37.4 ?C)  ?TempSrc: Oral Oral Oral Oral  ?SpO2: 96% 96% 94% 93%  ?Weight:      ?Height:      ? ? ? ? ?Data Reviewed: ? ?Basic Metabolic Panel: ?Recent Labs  ?Lab 11/24/21 ?0502 11/25/21 ?01/25/22 11/25/21 ?01/25/22 11/26/21 ?0358 11/27/21 ?0240 11/27/21 ?01/27/22 11/28/21 ?0320 11/29/21 ?01/29/22  ?NA 133* 132*  --  133* 133*  --  133* 132*   ?K 3.4* 3.4*  --  4.0 4.1  --  4.1 4.6  ?CL 99 100  --  98 98  --  99 97*  ?CO2 25 26  --  24 27  --  27 28  ?GLUCOSE 250* 242*  --  159* 212*  --  255* 247*  ?BUN 31* 26*  --  28* 18  --  13 12  ?CREATININE 0.92 0.65  --  0.70 0.59*  --  0.63 0.79  ?CALCIUM 7.5* 7.6*  --  8.0* 7.8*  --  7.7* 8.0*  ?MG  --   --  2.8*  --   --   --   --   --   ?PHOS 2.0*  --   --  2.2*  --  3.4  --   --   ? ? ?CBC: ?Recent Labs  ?Lab 11/25/21 ?01/25/22 11/26/21 ?0358 11/27/21 ?0240 11/28/21 ?0320 11/29/21 ?01/29/22  ?WBC 7.6 9.3 12.0* 12.3* 13.1*  ?HGB 13.9 14.9 14.9 12.8* 13.1  ?HCT 40.1 43.6 41.8 36.2* 39.1  ?MCV 85.7 86.3 84.3 84.4 87.9  ?PLT 100* 127* 155 191 234  ? ? ?LFT ?Recent Labs  ?Lab 11/25/21 ?01/25/22 11/26/21 ?0358 11/27/21 ?0240 11/28/21 ?0320 11/29/21 ?01/29/22  ?AST 116* 134* 125* 83* 56*  ?ALT 66* 79* 84* 68* 57*  ?ALKPHOS 265* 342* 349* 334* 315*  ?BILITOT 1.3* 1.7* 1.4* 1.5* 1.2  ?PROT 5.5* 6.0* 5.7* 5.4* 5.7*  ?ALBUMIN 2.1* 2.2* 2.0* 1.8* 1.9*  ? ?  ?Antibiotics: ?  Anti-infectives (From admission, onward)  ? ? Start     Dose/Rate Route Frequency Ordered Stop  ? 11/27/21 1000  penicillin G potassium 12 Million Units in dextrose 5 % 500 mL continuous infusion       ? 12 Million Units ?41.7 mL/hr over 12 Hours Intravenous Every 12 hours 11/27/21 0901    ? 11/23/21 2000  vancomycin (VANCOREADY) IVPB 1750 mg/350 mL  Status:  Discontinued       ? 1,750 mg ?175 mL/hr over 120 Minutes Intravenous Every 24 hours 11/22/21 1943 11/23/21 0931  ? 11/22/21 2300  cefTRIAXone (ROCEPHIN) 2 g in sodium chloride 0.9 % 100 mL IVPB  Status:  Discontinued       ? 2 g ?200 mL/hr over 30 Minutes Intravenous Every 24 hours 11/22/21 1923 11/27/21 0901  ? 11/22/21 2000  azithromycin (ZITHROMAX) 500 mg in sodium chloride 0.9 % 250 mL IVPB  Status:  Discontinued       ? 500 mg ?250 mL/hr over 60 Minutes Intravenous Every 24 hours 11/22/21 1923 11/23/21 0931  ? 11/22/21 1830  ceFEPIme (MAXIPIME) 2 g in sodium chloride 0.9 % 100 mL IVPB       ? 2 g ?200 mL/hr  over 30 Minutes Intravenous  Once 11/22/21 1825 11/22/21 1949  ? 11/22/21 1830  vancomycin (VANCOREADY) IVPB 1750 mg/350 mL       ? 1,750 mg ?175 mL/hr over 120 Minutes Intravenous  Once 11/22/21 1825 11/22/21 21

## 2021-11-30 NOTE — Care Management Important Message (Signed)
Important Message ? ?Patient Details IM Letter given to the Patient. ?Name: David Strong ?MRN: [id number] ?Date of Birth: Apr 25, 1956 ? ? ?Medicare Important Message Given:  Yes ? ? ? ? ?Kerin Salen ?11/30/2021, 10:30 AM ?

## 2021-11-30 NOTE — Progress Notes (Signed)
Patient ID: David Strong, male   DOB: 1955-09-18, 66 y.o.   MRN: [id number] ?I came by the bedside again this morning to re-examine the patient's left knee and right shoulder.  I performed aspirations of both his right shoulder and left knee last evening and there was no fluid collections found on multiple passes of a large-bore needle.  His clinical exam does not fit the picture of septic joints.  I can easily flex and extend his left knee with minimal discomfort.  There is no redness about his knee and no effusion.  I can easily palpate around his right shoulder with minimal discomfort.  I again put his shoulder through gentle rotation and abduction and his pain is only mild.  It does get worse when high abducting well past 90 degrees in terms of some of the pain and discomfort in the shoulder but this is not a clinical exam consistent with a septic joint and given the fact that I did not get any fluid off of multiple passes around the subacromial outlet, anterior shoulder and glenohumeral joint using an 18-gauge needle.  This likely could be more of a myositis picture.  I will continue to follow him closely right now do not feel that an urgent operative intervention is warranted.  He is not septic appearing and his vital signs are stable. ?

## 2021-11-30 NOTE — Progress Notes (Signed)
Nutrition Follow-up ? ?DOCUMENTATION CODES:  ? ?Not applicable ? ?INTERVENTION:  ?- Ensure Max po BID, each supplement provides 150 kcal and 30 grams of protein.  ? ?- MVI with minerals ? ?NUTRITION DIAGNOSIS:  ? ?Increased nutrient needs related to acute illness as evidenced by estimated needs. ? ?Ongoing ? ?GOAL:  ? ?Patient will meet greater than or equal to 90% of their needs ? ?Progressing- addressing needs via meals and addition of nutrition supplements ? ?MONITOR:  ? ?PO intake, Supplement acceptance, Labs, Weight trends ? ?REASON FOR ASSESSMENT:  ? ?Malnutrition Screening Tool ?  ? ?ASSESSMENT:  ? ?66 year old male with medical history of type 2 DM. He presented to the ED with several day hx of productive cough, subjective fevers, chills, myalgia, severe arthralgia, and new onset erythema and swelling on the L side of the base of the neck. CTA showed no PE, confirmed RML and bibasilar airspace opacities consistent with multifocal pneumonia and L sternoclavicular joint inflammatory changes with scattered foci of air within the soft tissues and sternoclavicular joint space compatible with localized L sternoclavicular septic arthritis/cellulitis, without evidence of drainable fluid collection or large joint effusion. Admitted for severe sepsis d/t streptococcal pneumonia with bacteremia and L sternoclavicular joint septic arthritis. ? ?GI now following pt for elevated LFT's possible etiology for sepsis induced cholestasis ? ?Spoke with pt at bedside. He states that during admission, he is eating well. Noted meal completion documentation of 75-100% of meals. PTA he reports eating 2 meals per day consisting of rice and broccoli or rice and soup. He typically does not eat meat as he does not like the way it is processed. We discussed alternate sources of protein. He eats eggs on occasion but not often to avoid high cholesterol. He also avoids beans as he reports this elevates his blood sugar. Spoke with him about  ways to balance meals with protein, vegetables and carbohydrate, to increase nutritional content of meals but also to help with blood sugar balance.  ? ?Pt states that his weight PTA was 172-174 lbs and denies weight loss. Reviewed weight history. Limited recent documentation, however noted 2% weight loss from 07/11/21-10/12/21. Admit weight is +5 kg from 02/23 weight. Suspect this could be attributed to presence of edema. Will continue to monitor throughout admission. ? ?Edema: deep pitting RUE, non-pitting LUE, non-pitting BLE ? ?Medications: SSI, semglee 20 units daily, miralax, senna ?IV drips: penicillin G potassium in D5 12 million units every 12 hours ? ?Labs: sodium 132, alkaline phosphatase 315, AST 56, ALT 57, CBG's 138-317 x24 hours ? ?NUTRITION - FOCUSED PHYSICAL EXAM: ? ?Flowsheet Row Most Recent Value  ?Orbital Region No depletion  ?Upper Arm Region No depletion  ?Thoracic and Lumbar Region No depletion  ?Buccal Region No depletion  ?Temple Region No depletion  ?Clavicle Bone Region No depletion  ?Clavicle and Acromion Bone Region No depletion  ?Scapular Bone Region No depletion  ?Dorsal Hand No depletion  ?Patellar Region No depletion  ?Anterior Thigh Region No depletion  ?Posterior Calf Region No depletion  ?Edema (RD Assessment) Moderate  [deep pitting RUE, non-pitting BLE]  ?Hair Reviewed  ?Eyes Reviewed  ?Mouth Reviewed  ?Skin Reviewed  ?Nails Reviewed  ? ?  ? ? ?Diet Order:   ?Diet Order   ? ?       ?  Diet Carb Modified Fluid consistency: Thin; Room service appropriate? Yes  Diet effective now       ?  ? ?  ?  ? ?  ? ? ?  EDUCATION NEEDS:  ? ?No education needs have been identified at this time ? ?Skin:  Skin Assessment: Reviewed RN Assessment ? ?Last BM:  4/11 ? ?Height:  ? ?Ht Readings from Last 1 Encounters:  ?11/22/21 5\' 4"  (1.626 m)  ? ? ?Weight:  ? ?Wt Readings from Last 1 Encounters:  ?11/27/21 85.3 kg  ? ? ?Ideal Body Weight:  59.1 kg ? ?BMI:  Body mass index is 32.28 kg/m?. ? ?Estimated  Nutritional Needs:  ? ?Kcal:  2100-2300 kcal ? ?Protein:  105-120 grams ? ?Fluid:  >/= 2.3 L/day ? ?01/27/22, RDN, LDN ?Clinical Nutrition ?

## 2021-11-30 NOTE — Consult Note (Signed)
Referring Provider: Oswald Hillock MD ?Primary Care Physician:  Horald Pollen, MD ?Primary Gastroenterologist: Althia Forts ? ?Reason for Consultation:  Elevated LFTs ? ?HPI: David Strong is a 66 y.o. male with medical history of T2DM presented to the ED with complaints of productive cough, subjective fever, chills, myalgia, severe arthralgia, new onset erythema and swelling of the left side of the base of the neck.  CTA showed no PE, confirmed right middle lobe pneumonia and bibasilar airspace opacities consistent with multifocal pneumonia and left sternoclavicular joint inflammatory changes with scattered foci of air within the soft tissue and sternoclavicular joint space compatible with left localized sternoclavicular septic arthritis/cellulitis, without evidence of drainable fluid collection or large joint effusion. Subsequent BC ID and urine antigen positive for strep.  Antibiotics were narrowed down to ceftriaxone. Newly diagnosed right upper arm SVT and left lower extremity DVT, briefly on IV heparin transition to Eliquis. ? ?GI consulted due to elevated LFTs. LFTs were normal prior to admission as recently as 10/12/21, now with AST 56, ALT 57, Alk phos 315, Tbili 1.2. Patient has no GI symptoms.  ? ?Patient denies GI symptoms at this time. Denies nausea, vomiting, abdominal pain. Has a history of chronic constipation but denies hematochezia or melena. Denies bowel movement in the last week but has been passing gas. Reports some abdominal distention. ? ?States 72-year-old son came home sick from daycare about a month ago and believes he picked up illness from his son.  Was having progressive weakness, states he coughed up blood a couple times but denies hematemesis. Patient's main complaint is joint pain in his right shoulder which is not present at rest but worsens with movement. ? ?Patient drinks alcohol on the weekends, denies smoking, denies frequent NSAID use and takes Tylenol occasionally. Denies  MI/stroke in the last 6 months. Denies blood thinners at home.  States he has had a recent colonoscopy that was normal, done in [place], records unavailable. ? ?Patient denies family history of GI malignancy or disease and denies previous problems related to his liver, gallbladder, or pancreas. ? ?Past Medical History:  ?Diagnosis Date  ? Diabetes mellitus without complication ([place])   ? ? ?History reviewed. No pertinent surgical history. ? ?Prior to Admission medications   ?Medication Sig Start Date End Date Taking? Authorizing Provider  ?Camphor-Eucalyptus-Menthol (VICKS VAPORUB EX) Apply 1 application. topically daily as needed (chest congestion).   Yes [provider]  ?glipiZIDE (GLIPIZIDE XL) 10 MG 24 hr tablet Take 2 tablets (20 mg total) by mouth 2 (two) times daily with a meal. ?Patient taking differently: Take 10 mg by mouth 2 (two) times daily with a meal. 05/08/21 11/23/21 Yes Sagardia, Ines Bloomer, MD  ?ibuprofen (ADVIL) 200 MG tablet Take 600 mg by mouth every 6 (six) hours as needed.   Yes [provider]  ?Phenol (EQ SORE THROAT SPRAY MT) Use as directed 1 spray in the mouth or throat daily as needed (sore throat).   Yes [provider]  ?Phenyleph-Doxylamine-DM-APAP (TYLENOL COLD MULTI-SYMPTOM PO) Take 2 tablets by mouth every 6 (six) hours as needed (cold symptoms).   Yes [provider]  ?sildenafil (VIAGRA) 100 MG tablet Take 0.5-1 tablets (50-100 mg total) by mouth daily as needed for erectile dysfunction. 10/12/21  Yes Sagardia, Ines Bloomer, MD  ?dapagliflozin propanediol (FARXIGA) 10 MG TABS tablet Take 1 tablet (10 mg total) by mouth daily before breakfast. ?Patient not taking: Reported on 11/22/2021 10/12/21 01/10/22  Horald Pollen, MD  ?glucose blood (ACCU-CHEK  AVIVA PLUS) test strip Use to test blood glucose daily. 07/20/21   Horald Pollen, MD  ? ? ?Scheduled Meds: ? apixaban  10 mg Oral BID  ? Followed by  ? [START ON 12/04/2021] apixaban  5 mg  Oral BID  ? insulin aspart  0-20 Units Subcutaneous TID WC  ? insulin aspart  0-5 Units Subcutaneous QHS  ? insulin aspart  6 Units Subcutaneous TID WC  ? insulin glargine-yfgn  20 Units Subcutaneous Daily  ? lidocaine  1 patch Transdermal Q24H  ? polyethylene glycol  17 g Oral BID  ? Ensure Max Protein  11 oz Oral BID  ? senna-docusate  2 tablet Oral Daily  ? ?Continuous Infusions: ? penicillin g continuous IV infusion 12 Million Units (11/30/21 0840)  ? ?PRN Meds:.acetaminophen **OR** [DISCONTINUED] acetaminophen, benzonatate, bisacodyl, naLOXone (NARCAN)  injection, oxyCODONE ? ?Allergies as of 11/22/2021 - Review Complete 11/22/2021  ?Allergen Reaction Noted  ? Codeine Itching 06/07/2015  ? Glipizide er [glipizide]  02/19/2019  ? Metformin and related Rash 01/17/2016  ? ? ?Family History  ?Adopted: Yes  ? ? ?Social History  ? ?Socioeconomic History  ? Marital status: Married  ?  Spouse name: Not on file  ? Number of children: Not on file  ? Years of education: Not on file  ? Highest education level: Not on file  ?Occupational History  ? Not on file  ?Tobacco Use  ? Smoking status: Former  ? Smokeless tobacco: Never  ?Substance and Sexual Activity  ? Alcohol use: Yes  ?  Comment: occasional  ? Drug use: No  ? Sexual activity: Yes  ?Other Topics Concern  ? Not on file  ?Social History Narrative  ? Not on file  ? ?Social Determinants of Health  ? ?Financial Resource Strain: Not on file  ?Food Insecurity: Not on file  ?Transportation Needs: Not on file  ?Physical Activity: Not on file  ?Stress: Not on file  ?Social Connections: Not on file  ?Intimate Partner Violence: Not on file  ? ? ?Review of Systems: Review of Systems  ?Constitutional:  Negative for chills and fever.  ?HENT:  Negative for sore throat.   ?Eyes:  Negative for discharge and redness.  ?Respiratory:  Positive for hemoptysis (one month ago, resolved). Negative for wheezing and stridor.   ?Cardiovascular:  Positive for chest pain and leg swelling.   ?Gastrointestinal:  Positive for constipation. Negative for abdominal pain, blood in stool, diarrhea, heartburn, melena, nausea and vomiting.  ?Genitourinary:  Negative for dysuria and urgency.  ?Musculoskeletal:  Positive for joint pain. Negative for falls.  ?Skin:  Negative for itching and rash.  ?Neurological:  Negative for seizures and loss of consciousness.  ?Endo/Heme/Allergies:  Negative for environmental allergies. Does not bruise/bleed easily.  ?Psychiatric/Behavioral:  Negative for memory loss. The patient does not have insomnia.    ? ?Physical Exam: Physical Exam ?Constitutional:   ?   General: He is not in acute distress. ?   Appearance: Normal appearance. He is not toxic-appearing.  ?HENT:  ?   Head: Normocephalic and atraumatic.  ?   Right Ear: External ear normal.  ?   Left Ear: External ear normal.  ?   Nose: Nose normal.  ?   Mouth/Throat:  ?   Mouth: Mucous membranes are moist.  ?   Pharynx: Oropharynx is clear.  ?Eyes:  ?   General: No scleral icterus. ?   Extraocular Movements: Extraocular movements intact.  ?   Conjunctiva/sclera: Conjunctivae normal.  ?  Cardiovascular:  ?   Rate and Rhythm: Normal rate and regular rhythm.  ?   Pulses: Normal pulses.  ?   Heart sounds: Normal heart sounds.  ?Pulmonary:  ?   Effort: Pulmonary effort is normal.  ?   Breath sounds: Normal breath sounds.  ?Abdominal:  ?   General: Bowel sounds are normal. There is distension.  ?   Tenderness: There is no abdominal tenderness. There is no guarding.  ?Musculoskeletal:     ?   General: Swelling present.  ?   Cervical back: Normal range of motion and neck supple.  ?   Right lower leg: Edema present.  ?   Left lower leg: Edema present.  ?Skin: ?   General: Skin is warm and dry.  ?Neurological:  ?   General: No focal deficit present.  ?   Mental Status: He is alert and oriented to person, place, and time.  ?Psychiatric:     ?   Mood and Affect: Mood normal.     ?   Behavior: Behavior normal.  ?  ?Vital signs: ?Vitals:  ?  11/29/21 2111 11/30/21 0501  ?BP: 139/82 137/83  ?Pulse: (!) 101 (!) 103  ?Resp: 16 15  ?Temp: 98.8 ?F (37.1 ?C) 99.4 ?F (37.4 ?C)  ?SpO2: 94% 93%  ? ?Last BM Date : 11/28/21 ? ? ? ?GI:  ?Lab Results: ?Recen

## 2021-11-30 NOTE — Consult Note (Signed)
? ?[facility]  ?[phone]  ? ?HEMATOLOGY ONCOLOGY INPATIENT CONSULTATION  ? ?David Strong  DOB: [birth year]  MR#: [id number]  CSN#: [id number]   ? ?Requesting Physician: [facility] ? ?Patient Care Team: ?[name], MD as PCP - General (Internal Medicine) ? ?Reason for consult: multiple DVT and thrombocytopenia ? ?History of present illness:   66 year old gentleman with past medical history of diabetes, but otherwise healthy, presented with persistent fever, cough and malaise, admitted on November 22, 2021.  He has been treated for pneumonia and sepsis. During the hospital course, he developed multiple joint edema, right upper extremity superficial vein thrombosis, left lower extremity DVT, mild thrombocytopenia, I was called by Dr. Sharl Ma for evaluation.  ? ?Patient states that he got his fever from his 14-year-old son in daycare, his daughter and wife also became febrile around same time.  His family members oral cavity well spontaneously, however he had a persistent fever, cough, and malaise and came to the ED a week ago.  ID work-up showed multifocal pneumonia, blood culture also showed Streptococcus.  He developed multiple joint edema after admission, especially in the left sternoclavicular joint and bilateral ankles, that was evaluated by orthopedic surgeon Dr. Magnus Ivan, joint aspiration was negative, septic arthritis was felt to be unlikely.  He was found to have multiple DVT, initially started on heparin, which transitioned to Eliquis.  He is tolerating anticoagulation well.  His lab work showed mild persistent transaminitis, he was evaluated by GI team today. ? ?He was found to have mild thrombocytopenia with platelet in 130K  7 to 8 years ago, 104 K in 2008.  The rest of CBC was normal in the past.  Patient was not aware he had a mild thrombocytopenia in the past, denies any bleeding or known liver disease.  Admission labs showed platelet 88 K, normal WBC and  hemoglobin.  His thrombocytopenia has resolved spontaneously resolved since admission, platelet 234 yesterday.  Mild leukocytosis, hemoglobin normal. ? ?MEDICAL HISTORY:  ?Past Medical History:  ?Diagnosis Date  ? Diabetes mellitus without complication (HCC)   ? ? ?SURGICAL HISTORY: ?History reviewed. No pertinent surgical history. ? ?SOCIAL HISTORY: ?Social History  ? ?Socioeconomic History  ? Marital status: Married  ?  Spouse name: Not on file  ? Number of children: Not on file  ? Years of education: Not on file  ? Highest education level: Not on file  ?Occupational History  ? Not on file  ?Tobacco Use  ? Smoking status: Former  ? Smokeless tobacco: Never  ?Substance and Sexual Activity  ? Alcohol use: Yes  ?  Comment: occasional  ? Drug use: No  ? Sexual activity: Yes  ?Other Topics Concern  ? Not on file  ?Social History Narrative  ? Not on file  ? ?Social Determinants of Health  ? ?Financial Resource Strain: Not on file  ?Food Insecurity: Not on file  ?Transportation Needs: Not on file  ?Physical Activity: Not on file  ?Stress: Not on file  ?Social Connections: Not on file  ?Intimate Partner Violence: Not on file  ? ? ?FAMILY HISTORY: ?Family History  ?Adopted: Yes  ? ? ?ALLERGIES:  is allergic to codeine, glipizide er [glipizide], and metformin and related. ? ?MEDICATIONS:  ?Current Facility-Administered Medications  ?Medication Dose Route Frequency Provider Last Rate Last Admin  ? acetaminophen (TYLENOL) tablet 650 mg  650 mg Oral Q6H PRN Elease Etienne, MD   650 mg at 11/25/21 0835  ? apixaban (ELIQUIS) tablet 10  mg  10 mg Oral BID Rollene FareWilliamson, Erin R, RPH   10 mg at 11/30/21 1044  ? Followed by  ? [START ON 12/04/2021] apixaban (ELIQUIS) tablet 5 mg  5 mg Oral BID Rollene FareWilliamson, Erin R, RPH      ? benzonatate (TESSALON) capsule 200 mg  200 mg Oral TID PRN Howerter, Justin B, DO   200 mg at 11/24/21 2215  ? bisacodyl (DULCOLAX) suppository 10 mg  10 mg Rectal Daily PRN Hongalgi, Theadora RamaAnand D, MD      ? insulin  aspart (novoLOG) injection 0-20 Units  0-20 Units Subcutaneous TID WC Meredeth IdeLama, Gagan S, MD   3 Units at 11/30/21 1801  ? insulin aspart (novoLOG) injection 0-5 Units  0-5 Units Subcutaneous QHS Sharl MaLama, Sarina IllGagan S, MD      ? insulin aspart (novoLOG) injection 6 Units  6 Units Subcutaneous TID WC Meredeth IdeLama, Gagan S, MD   6 Units at 11/30/21 1801  ? insulin glargine-yfgn (SEMGLEE) injection 20 Units  20 Units Subcutaneous Daily Meredeth IdeLama, Gagan S, MD   20 Units at 11/30/21 1045  ? lidocaine (LIDODERM) 5 % 1 patch  1 patch Transdermal Q24H Elease EtienneHongalgi, Anand D, MD   1 patch at 11/29/21 1249  ? multivitamin with minerals tablet 1 tablet  1 tablet Oral Daily Meredeth IdeLama, Gagan S, MD   1 tablet at 11/30/21 1429  ? naloxone [facility](NARCAN) injection 0.4 mg  0.4 mg Intravenous PRN Howerter, Justin B, DO      ? oxyCODONE (Oxy IR/ROXICODONE) immediate release tablet 5-10 mg  5-10 mg Oral Q4H PRN Elease EtienneHongalgi, Anand D, MD   10 mg at 11/30/21 1432  ? penicillin G potassium 12 Million Units in dextrose 5 % 500 mL continuous infusion  12 Million Units Intravenous Q12H Daiva EvesVan Dam, Lisette Grinderornelius N, MD 41.7 mL/hr at 11/30/21 0840 12 Million Units at 11/30/21 0840  ? polyethylene glycol (MIRALAX / GLYCOLAX) packet 17 g  17 g Oral BID Elease EtienneHongalgi, Anand D, MD   17 g at 11/30/21 1044  ? protein supplement (ENSURE MAX) liquid  11 oz Oral BID Elease EtienneHongalgi, Anand D, MD   11 oz at 11/28/21 1019  ? senna-docusate (Senokot-S) tablet 2 tablet  2 tablet Oral Daily Elease EtienneHongalgi, Anand D, MD   2 tablet at 11/30/21 1045  ? sodium chloride (PF) 0.9 % injection           ? ? ?REVIEW OF SYSTEMS:   ?Constitutional: Denies fevers, chills or abnormal night sweats ?Eyes: Denies blurriness of vision, double vision or watery eyes ?Ears, nose, mouth, throat, and face: Denies mucositis or sore throat ?Respiratory: Denies cough, dyspnea or wheezes ?Cardiovascular: Denies palpitation, chest discomfort or lower extremity swelling ?Gastrointestinal:  Denies nausea, heartburn or change in bowel habits ?Skin: Denies  abnormal skin rashes ?Lymphatics: Denies new lymphadenopathy or easy bruising ?Neurological:Denies numbness, tingling or new weaknesses ?Behavioral/Psych: Mood is stable, no new changes  ?All other systems were reviewed with the patient and are negative. ? ?PHYSICAL EXAMINATION: ?ECOG PERFORMANCE STATUS: 1 - Symptomatic but completely ambulatory ? ?Vitals:  ? 11/30/21 0501 11/30/21 1535  ?BP: 137/83 139/85  ?Pulse: (!) 103 99  ?Resp: 15 18  ?Temp: 99.4 ?F (37.4 ?C) 98.2 ?F (36.8 ?C)  ?SpO2: 93% 95%  ? ?Filed Weights  ? 11/25/21 0500 11/26/21 0500 11/27/21 0512  ?Weight: 148 lb 5.9 oz (67.3 kg) 185 lb 6.5 oz (84.1 kg) 188 lb 0.8 oz (85.3 kg)  ? ? ?GENERAL:alert, no distress and comfortable ?SKIN: skin color, texture, turgor are  normal, no rashes or significant lesions ?EYES: normal, conjunctiva are pink and non-injected, sclera clear ?OROPHARYNX:no exudate, no erythema and lips, buccal mucosa, and tongue normal  ?NECK: supple, thyroid normal size, non-tender, without nodularity ?LYMPH:  no palpable lymphadenopathy in the cervical, axillary or inguinal ?LUNGS: clear to auscultation and percussion with normal breathing effort ?HEART: regular rate & rhythm and no murmurs and no lower extremity edema ?ABDOMEN:abdomen soft, non-tender and normal bowel sounds ?Musculoskeletal:no cyanosis of digits and no clubbing, (+) edema of left sternoclavicular joint, and the bilateral ankles. ?PSYCH: alert & oriented x 3 with fluent speech ?NEURO: no focal motor/sensory deficits ? ?LABORATORY DATA:  ?I have reviewed the data as listed ?Lab Results  ?Component Value Date  ? WBC 13.1 (H) 11/29/2021  ? HGB 13.1 11/29/2021  ? HCT 39.1 11/29/2021  ? MCV 87.9 11/29/2021  ? PLT 234 11/29/2021  ? ?Recent Labs  ?  11/23/21 ?0423 11/24/21 ?0502 11/27/21 ?0240 11/28/21 ?0320 11/29/21 ?6734  ?NA 133*   < > 133* 133* 132*  ?K 3.4*   < > 4.1 4.1 4.6  ?CL 98   < > 98 99 97*  ?CO2 24   < > 27 27 28   ?GLUCOSE 377*   < > 212* 255* 247*  ?BUN 28*   < >  18 13 12   ?CREATININE 1.14   < > 0.59* 0.63 0.79  ?CALCIUM 7.8*   < > 7.8* 7.7* 8.0*  ?GFRNONAA >60   < > >60 >60 >60  ?PROT 5.6*   < > 5.7* 5.4* 5.7*  ?ALBUMIN 2.3*   < > 2.0* 1.8* 1.9*  ?AST 58*   < > 125* 83* 56

## 2021-12-01 ENCOUNTER — Inpatient Hospital Stay: Payer: Self-pay

## 2021-12-01 DIAGNOSIS — R6520 Severe sepsis without septic shock: Secondary | ICD-10-CM | POA: Diagnosis not present

## 2021-12-01 DIAGNOSIS — A419 Sepsis, unspecified organism: Secondary | ICD-10-CM | POA: Diagnosis not present

## 2021-12-01 DIAGNOSIS — I82402 Acute embolism and thrombosis of unspecified deep veins of left lower extremity: Secondary | ICD-10-CM | POA: Diagnosis not present

## 2021-12-01 DIAGNOSIS — L0390 Cellulitis, unspecified: Secondary | ICD-10-CM | POA: Diagnosis not present

## 2021-12-01 DIAGNOSIS — J189 Pneumonia, unspecified organism: Secondary | ICD-10-CM | POA: Diagnosis not present

## 2021-12-01 DIAGNOSIS — R600 Localized edema: Secondary | ICD-10-CM | POA: Diagnosis not present

## 2021-12-01 LAB — GLUCOSE, CAPILLARY
Glucose-Capillary: 212 mg/dL — ABNORMAL HIGH (ref 70–99)
Glucose-Capillary: 165 mg/dL — ABNORMAL HIGH (ref 70–99)
Glucose-Capillary: 99 mg/dL (ref 70–99)
Glucose-Capillary: 175 mg/dL — ABNORMAL HIGH (ref 70–99)

## 2021-12-01 LAB — COMPREHENSIVE METABOLIC PANEL
ALT: 52 U/L — ABNORMAL HIGH (ref 0–44)
AST: 56 U/L — ABNORMAL HIGH (ref 15–41)
Albumin: 2 g/dL — ABNORMAL LOW (ref 3.5–5.0)
Alkaline Phosphatase: 297 U/L — ABNORMAL HIGH (ref 38–126)
Anion gap: 9 (ref 5–15)
BUN: 12 mg/dL (ref 8–23)
CO2: 26 mmol/L (ref 22–32)
Calcium: 8.2 mg/dL — ABNORMAL LOW (ref 8.9–10.3)
Chloride: 93 mmol/L — ABNORMAL LOW (ref 98–111)
Creatinine, Ser: 0.65 mg/dL (ref 0.61–1.24)
GFR, Estimated: >60 mL/min (ref >60)
Glucose, Bld: 171 mg/dL — ABNORMAL HIGH (ref 70–99)
Potassium: 3.4 mmol/L — ABNORMAL LOW (ref 3.5–5.1)
Sodium: 128 mmol/L — ABNORMAL LOW (ref 135–145)
Total Bilirubin: 1.3 mg/dL — ABNORMAL HIGH (ref 0.3–1.2)
Total Protein: 6.3 g/dL — ABNORMAL LOW (ref 6.5–8.1)

## 2021-12-01 MED ORDER — SODIUM CHLORIDE 0.9% FLUSH: 10.0000 mL | INTRAVENOUS | Status: DC | PRN

## 2021-12-01 MED ORDER — CHLORHEXIDINE GLUCONATE CLOTH 2 % EX PADS
6.0000 | MEDICATED_PAD | Freq: Every day | CUTANEOUS | Status: DC
  Administered 2021-12-01 – 2021-12-18 (×16): 6 via TOPICAL

## 2021-12-01 MED ORDER — POTASSIUM CHLORIDE CRYS ER 20 MEQ PO TBCR
40.0000 meq | EXTENDED_RELEASE_TABLET | Freq: Once | ORAL | Status: AC
  Administered 2021-12-01: 40 meq via ORAL
  Filled 2021-12-01: qty 2

## 2021-12-01 MED ORDER — PENICILLIN G POTASSIUM IV (FOR PTA / DISCHARGE USE ONLY): 24.0000 10*6.[IU] | INTRAVENOUS | 0 refills | Status: DC

## 2021-12-01 NOTE — Progress Notes (Addendum)
PHARMACY CONSULT NOTE FOR: ? ?OUTPATIENT  PARENTERAL ANTIBIOTIC THERAPY (OPAT) ? ?Indication: Strep pneumo bacteremia/septic arthritis ?Regimen: Penicillin G 24 million units every 24 hours as a continuous infusion ?End date: 01/03/22 ? ?IV antibiotic discharge orders are pended. ?To discharging provider:  please sign these orders via discharge navigator,  ?Select New Orders & click on the button choice - Manage This Unsigned Work.  ?  ? ?Thank you for allowing pharmacy to be a part of this patient's care. ? ?Sharin Mons, PharmD, BCPS, BCIDP ?Infectious Diseases Clinical Pharmacist ?Phone: [phone] ?12/01/2021, 10:51 AM ? ?

## 2021-12-01 NOTE — TOC Transition Note (Signed)
Transition of Care (TOC) - CM/SW Discharge Note ? ? ?Patient Details  ?Name: David Strong ?MRN: [id number] ?Date of Birth: 07/26/1956 ? ?Transition of Care (TOC) CM/SW Contact:  ?Ida Rogue, LCSW ?Phone Number: ?12/01/2021, 1:02 PM ? ? ?Clinical Narrative:   Patient who is confirmed plan for d/c tomorrow will be going home on IV infusion. Pam with [facility] alerted, as was [place] with [facility]. Asked MD for OPAT orders today. TOC will continue to follow during the course of hospitalization. ? ? ? ? ?Final next level of care: Home w Home Health Services ?Barriers to Discharge: Barriers Resolved ? ? ?Patient Goals and CMS Choice ?Patient states their goals for this hospitalization and ongoing recovery are:: Home ?CMS Medicare.gov Compare Post Acute Care list provided to:: Patient ?Choice offered to / list presented to : Patient ? ?Discharge Placement ?  ?           ?  ?  ?  ?  ? ?Discharge Plan and Services ?  ?Discharge Planning Services: CM Consult ?Post Acute Care Choice: NA          ?  ?  ?  ?  ?  ?  ?  ?  ?  ?  ? ?Social Determinants of Health (SDOH) Interventions ?  ? ? ?Readmission Risk Interventions ?   ? View : No data to display.  ?  ?  ?  ? ? ? ? ? ?

## 2021-12-01 NOTE — Progress Notes (Signed)
Occupational Therapy Treatment ?Patient Details ?Name: David Strong ?MRN: [id number] ?DOB: 13-Jun-1956 ?Today's Date: 12/01/2021 ? ? ?History of present illness 66 year old male with medical history significant for type II DM, presented to the ED with a couple days history of progressive productive cough, subjective fevers, chills, myalgia, severe arthralgia and new onset erythema and swelling on the left side of the base of the neck.  CTA showed no PE, confirmed RML and bibasilar airspace opacities consistent with multifocal pneumonia and also left sternoclavicular joint inflammatory changes with scattered foci of air within the soft tissues and sternoclavicular joint space compatible with localized left sternoclavicular septic arthritis/cellulitis, without evidence of drainable fluid collection or large joint effusion.  Subsequent BCID and urine antigen positive for strep.  Antibiotics narrowed to IV ceftriaxone.  Admitted for severe sepsis, POA due to streptococcal pneumonia with bacteremia, left sternoclavicular joint septic arthritis and other large joint arthralgia/arthritis (right shoulder, left knee).  IR planning ultrasound-guided possible aspiration of left [place] joint. +RUE superficial thrombosis and L LE DVT ?  ?OT comments ? Patient able to perform toileting with supervision and minimal ambulation in room with RW. Patient resistant to activity due to pain despite therapist recommendations to maintain active. Reiterated elbow, hand and wrist exercises.   ? ?Recommendations for follow up therapy are one component of a multi-disciplinary discharge planning process, led by the attending physician.  Recommendations may be updated based on patient status, additional functional criteria and insurance authorization. ?   ?Follow Up Recommendations ? Home health OT  ?  ?Assistance Recommended at Discharge Frequent or constant Supervision/Assistance  ?Patient can return home with the following ? Assistance with  cooking/housework;A lot of help with bathing/dressing/bathroom;A little help with walking and/or transfers;Help with stairs or ramp for entrance ?  ?Equipment Recommendations ? BSC/3in1  ?  ?Recommendations for Other Services   ? ?  ?Precautions / Restrictions Precautions ?Precautions: Fall  ? ? ?  ? ?Mobility Bed Mobility ?  ?  ?  ?  ?  ?  ?  ?  ?  ? ?Transfers ?  ?  ?  ?  ?  ?  ?  ?  ?  ?  ?  ?  ?Balance   ?  ?  ?  ?  ?  ?  ?  ?  ?  ?  ?  ?  ?  ?  ?  ?  ?  ?  ?   ? ?ADL either performed or assessed with clinical judgement  ? ?ADL Overall ADL's : Needs assistance/impaired ?  ?  ?  ?  ?  ?  ?  ?  ?  ?  ?  ?  ?Toilet Transfer: Supervision/safety;BSC/3in1 ?Toilet Transfer Details (indicate cue type and reason): Patient found seated on BSC.  Patient able to stand with walker without assistance ?Toileting- Clothing Manipulation and Hygiene: Supervision/safety;Sit to/from stand ?Toileting - Clothing Manipulation Details (indicate cue type and reason): able to clean himself ?  ?  ?Functional mobility during ADLs: Min guard;Rolling walker (2 wheels) ?General ADL Comments: Overall min guard for ambulation to recliner. ?  ? ?Extremity/Trunk Assessment   ?  ?  ?  ?  ?  ? ?Vision Baseline Vision/History: 1 Wears glasses ?  ?  ?Perception   ?  ?Praxis   ?  ? ?Cognition Arousal/Alertness: Awake/alert ?Behavior During Therapy: [facility] for tasks assessed/performed ?Overall Cognitive Status: Within Functional Limits for tasks assessed ?  ?  ?  ?  ?  ?  ?  ?  ?  ?  ?  ?  ?  ?  ?  ?  ?  ?  ?  ?   ?  Exercises Other Exercises ?Other Exercises: once again encouraged AROM of elbow, wrist and hand to maintain ROM and reduce edema, he's able to lift shoulder approx 15 degrees ? ?  ?Shoulder Instructions   ? ? ?  ?General Comments    ? ? ?Pertinent Vitals/ Pain       Pain Assessment ?Pain Assessment: 0-10 ?Pain Score: 8  ?Pain Location: Left knee ?Pain Descriptors / Indicators: Discomfort, Grimacing, Tightness ?Pain Intervention(s): Monitored  during session ? ?Home Living   ?  ?  ?  ?  ?  ?  ?  ?  ?  ?  ?  ?  ?  ?  ?  ?  ?  ?  ? ?  ?Prior Functioning/Environment    ?  ?  ?  ?   ? ?Frequency ? Min 2X/week  ? ? ? ? ?  ?Progress Toward Goals ? ?OT Goals(current goals can now be found in the care plan section) ? Progress towards OT goals: Progressing toward goals ? ?Acute Rehab OT Goals ?Patient Stated Goal: use arm for work ?OT Goal Formulation: With patient ?Time For Goal Achievement: 12/10/21 ?Potential to Achieve Goals: Good  ?Plan Discharge plan remains appropriate   ? ?Co-evaluation ? ? ?   ?  ?  ?  ?  ? ?  ?AM-PAC OT "6 Clicks" Daily Activity     ?Outcome Measure ? ? Help from another person eating meals?: None ?Help from another person taking care of personal grooming?: A Little ?Help from another person toileting, which includes using toliet, bedpan, or urinal?: A Little ?Help from another person bathing (including washing, rinsing, drying)?: A Little ?Help from another person to put on and taking off regular upper body clothing?: A Lot ?Help from another person to put on and taking off regular lower body clothing?: A Lot ?6 Click Score: 17 ? ?  ?End of Session Equipment Utilized During Treatment: Rolling walker (2 wheels) ? ?OT Visit Diagnosis: Pain ?Pain - Right/Left: Right ?Pain - part of body: Shoulder ?  ?Activity Tolerance Patient limited by pain ?  ?Patient Left in chair;with call bell/phone within reach ?  ?Nurse Communication  (okay to see) ?  ? ?   ? ?Time: 4656-8127 ?OT Time Calculation (min): 15 min ? ?Charges: OT General Charges ?$OT Visit: 1 Visit ?OT Treatments ?$Self Care/Home Management : 8-22 mins ? ?Wayland Baik, OTR/L ?Acute Care Rehab Services  ?Office [phone] ?Pager: [phone]  ? ?Ayomide Purdy L Keenan Dimitrov ?12/01/2021, 12:10 PM ?

## 2021-12-01 NOTE — Progress Notes (Signed)
[facility] ? ?PROGRESS NOTE ? ?Caitlin Hillmer XLK:[id number] DOB: 1956/08/12 DOA: 11/22/2021 ?PCP: [name], MD ? ? ?Brief HPI:   ?66 year old male with medical history of diabetes mellitus type 2 presented to the ED with complaints of productive cough, subjective fever, chills, myalgia, severe arthralgia, new onset erythema and swelling of the left side of the base of the neck.  CTA showed no PE, confirmed right middle lobe pneumonia and bibasilar airspace opacities consistent with multifocal pneumonia and left sternoclavicular joint inflammatory changes with scattered foci of air within the soft tissue and sternoclavicular joint space compatible with left localized sternoclavicular septic arthritis/cellulitis, without evidence of drainable fluid collection or large joint effusion.  Subsequent BC ID and urine antigen positive for strep.  Antibiotics were narrowed down to ceftriaxone.  Newly diagnosed right upper arm SVT and left lower extremity DVT, briefly on IV heparin transition to Eliquis. ? ?MRI right shoulder findings suspicious for septic arthritis involving AC joint and glenohumeral joint.  Severe myositis and pyomyositis involving the subscapularis muscle, the anterior deltoid muscle and distal aspect of pectoralis muscle.  There is also myositis involving the infraspinatus muscle.   ? ? ? ? ?Subjective  ? ?Patient seen and examined, appreciate oncology and gastroenterology input.  Patient wants to go home.  Infectious disease has seen the patient and recommends 6 weeks of IV antibiotics for possible endovascular coverage. ? ? Assessment/Plan:  ? ?Severe sepsis ?-Due to streptococcal multifocal pneumonia/bacteremia ?-Patient presented with sepsis criteria including tachypnea, tachycardia, fever ?-CT chest confirmed multifocal pneumonia ?-Also found to have polyarticular septic arthritis, right shoulder septic arthritis with right shoulder septic joint, septic sternoclavicular joint with  multiple areas of bio myositis on imaging of MRI ?-Patient empirically started on IV vancomycin, ceftriaxone and Zithromax ?-BC ID and urine was positive for Streptococcus ?-Antibiotics narrowed to IV ceftriaxone and then changed to penicillin G on 4/10 ?-ID has seen the patient and recommends 6 weeks of penicillin for endovascular coverage and outpatient TEE ?-I called cardiology and they will call patient to set up outpatient TEE ?-ID will follow in 3 weeks ? ?Presumed left sternoclavicular joint septic arthritis ?-CTA chest was concerning for septic arthritis ?-ED provider spoke to CT surgeon on-call, who felt that case and imaging were consistent with septic arthritis of left sternoclavicular joint and recommended medical management and IV antibiotics without need for surgical intervention ?-Per ultrasound, there was minimal fluid in the left [place] joint ?-Started on antibiotics as above ? ?Septic arthritis of right shoulder ?-MRI right shoulder was ordered as per ID recommendation ?-Which showed septic arthritis involving AC joint and glenohumeral joint-severe myositis and pyomyositis involving subscapularis muscle, anterior deltoid muscle and distal aspect of pectoralis muscle ?-There is also myositis involving the infraspinatus muscle ?-Orthopedics consulted, spoke with Dr. Magnus Ivan; ?-He saw patient and could not aspirate any fluid from the right shoulder  ?-No surgical intervention recommended ? ?Left ankle swelling/left knee swelling ?-MRI of left ankle was unremarkable for joint involvement only showed soft tissue inflammation; consistent with cellulitis ?-Left knee is swollen and warm to touch;  ?-Joint aspiration was attempted however no fluid obtained ?-Not consistent with septic arthritis ? ?Community-acquired pneumonia ?-Started on antibiotics as above ? ?DVT ?-Right upper extremity superficial vein thrombosis involving cephalic vein ?-Left lower extremity age indeterminate DVT left popliteal vein acute  DVT left gastrocnemius and left posterior tibial veins ?-Patient was briefly on IV heparin, transition to Eliquis ?-Due to complaint of bilateral leg pain and swelling, obtained  RLE venous Dopplers which were negative for DVT or SVT ?-I have consulted oncology for further evaluation for this patient with multiple DVTs, hypoalbuminemia, transaminitis ?-We will check CT abdomen/pelvis for underlying metastatic process ? ?Transaminitis ?-Likely in setting of severe sepsis ?-No GI symptoms ?-Hepatitis panel negative ?-Right upper quadrant ultrasound showed gallbladder sludge with no acute findings ?-LFTs have been elevated; unclear etiology ?-GI was consulted, at this time it was felt that LFTs are elevated due to sepsis ? ?Anasarca ?-Patient has swelling of right upper extremity and both lower extremities ?-Albumin is down to 1.9 ?-Dietitian consulted for hypoalbuminemia ?-Started on Nuromax p.o. twice daily, MVI with minerals ? ?Thrombocytopenia ?-Suspect chronic or acute on chronic ?-Platelet count in 2018 was 104 ?-Oncology was consulted.  Dr. Parke PoissonFang will follow patient in 3 months in clinic ? ?Diabetes mellitus type 2 ?-Oral hypoglycemics on hold ?-CBG has been elevated ?-Increase Semglee to 20 units subcu daily, NovoLog meal coverage to 6 units 3 times daily ?-Change sliding scale to resistant ? ?Hypokalemia ?-Replace potassium and follow BMP in am ? ?Hyponatremia ?-Mild ?-Sodium is 128 ?-Follow BMP in am ? ?Hypophosphatemia ?-Replaced ? ? ? ? ? ?Medications ? ?  ? apixaban  10 mg Oral BID  ? Followed by  ? [START ON 12/04/2021] apixaban  5 mg Oral BID  ? Chlorhexidine Gluconate Cloth  6 each Topical Daily  ? insulin aspart  0-20 Units Subcutaneous TID WC  ? insulin aspart  0-5 Units Subcutaneous QHS  ? insulin aspart  6 Units Subcutaneous TID WC  ? insulin glargine-yfgn  20 Units Subcutaneous Daily  ? lidocaine  1 patch Transdermal Q24H  ? multivitamin with minerals  1 tablet Oral Daily  ? polyethylene glycol  17 g  Oral BID  ? Ensure Max Protein  11 oz Oral BID  ? senna-docusate  2 tablet Oral Daily  ? ? ? Data Reviewed:  ? ?CBG: ? ?Recent Labs  ?Lab 11/30/21 ?1254 11/30/21 ?1648 11/30/21 ?2208 12/01/21 ?[id number] 12/01/21 ?1138  ?GLUCAP 317* 136* 111* 212* 165*  ? ? ?SpO2: 98 %  ? ? ?Vitals:  ? 11/30/21 1535 11/30/21 2207 12/01/21 0513 12/01/21 1222  ?BP: 139/85 (!) 147/85 133/86 127/86  ?Pulse: 99 (!) 110 (!) 107 (!) 110  ?Resp: 18 18 18 20   ?Temp: 98.2 ?F (36.8 ?C) 99.3 ?F (37.4 ?C) 98.9 ?F (37.2 ?C) 98.6 ?F (37 ?C)  ?TempSrc: Oral Oral Oral Oral  ?SpO2: 95% 94% 97% 98%  ?Weight:      ?Height:      ? ? ? ? ?Data Reviewed: ? ?Basic Metabolic Panel: ?Recent Labs  ?Lab 11/25/21 ?0718 11/26/21 ?0358 11/27/21 ?0240 11/27/21 ?[id number] 11/28/21 ?0320 11/29/21 ?[id number] 12/01/21 ?1040  ?NA  --  133* 133*  --  133* 132* 128*  ?K  --  4.0 4.1  --  4.1 4.6 3.4*  ?CL  --  98 98  --  99 97* 93*  ?CO2  --  24 27  --  27 28 26   ?GLUCOSE  --  159* 212*  --  255* 247* 171*  ?BUN  --  28* 18  --  13 12 12   ?CREATININE  --  0.70 0.59*  --  0.63 0.79 0.65  ?CALCIUM  --  8.0* 7.8*  --  7.7* 8.0* 8.2*  ?MG 2.8*  --   --   --   --   --   --   ?PHOS  --  2.2*  --  3.4  --   --   --   ? ? ?  CBC: ?Recent Labs  ?Lab 11/25/21 ?3016 11/26/21 ?0358 11/27/21 ?0240 11/28/21 ?0320 11/29/21 ?0109  ?WBC 7.6 9.3 12.0* 12.3* 13.1*  ?HGB 13.9 14.9 14.9 12.8* 13.1  ?HCT 40.1 43.6 41.8 36.2* 39.1  ?MCV 85.7 86.3 84.3 84.4 87.9  ?PLT 100* 127* 155 191 234  ? ? ?LFT ?Recent Labs  ?Lab 11/26/21 ?0358 11/27/21 ?0240 11/28/21 ?0320 11/29/21 ?3235 12/01/21 ?1040  ?AST 134* 125* 83* 56* 56*  ?ALT 79* 84* 68* 57* 52*  ?ALKPHOS 342* 349* 334* 315* 297*  ?BILITOT 1.7* 1.4* 1.5* 1.2 1.3*  ?PROT 6.0* 5.7* 5.4* 5.7* 6.3*  ?ALBUMIN 2.2* 2.0* 1.8* 1.9* 2.0*  ? ?  ?Antibiotics: ?Anti-infectives (From admission, onward)  ? ? Start     Dose/Rate Route Frequency Ordered Stop  ? 12/01/21 0000  penicillin G IVPB       ? 24 Million Units Intravenous Every 24 hours 12/01/21 1303 01/03/22 2359  ?  11/27/21 1000  penicillin G potassium 12 Million Units in dextrose 5 % 500 mL continuous infusion       ? 12 Million Units ?41.7 mL/hr over 12 Hours Intravenous Every 12 hours 11/27/21 0901    ? 11/23/21 2

## 2021-12-01 NOTE — Progress Notes (Signed)
Physical Therapy Treatment ?Patient Details ?Name: David Strong ?MRN: [id number] ?DOB: [birth year] ?Today's Date: 12/01/2021 ? ? ?History of Present Illness 66 year old male with medical history significant for type II DM, presented to the ED with a couple days history of progressive productive cough, subjective fevers, chills, myalgia, severe arthralgia and new onset erythema and swelling on the left side of the base of the neck.  CTA showed no PE, confirmed RML and bibasilar airspace opacities consistent with multifocal pneumonia and also left sternoclavicular joint inflammatory changes with scattered foci of air within the soft tissues and sternoclavicular joint space compatible with localized left sternoclavicular septic arthritis/cellulitis, without evidence of drainable fluid collection or large joint effusion.  Subsequent BCID and urine antigen positive for strep.  Antibiotics narrowed to IV ceftriaxone.  Admitted for severe sepsis, POA due to streptococcal pneumonia with bacteremia, left sternoclavicular joint septic arthritis and other large joint arthralgia/arthritis (right shoulder, left knee).  IR planning ultrasound-guided possible aspiration of left [place] joint. +RUE superficial thrombosis and L LE DVT ? ?  ?PT Comments  ? ? Patient making steady progress with mobility despite pain in Lt LE and Rt UE. He was able to demonstrate safe technique with sit<>stand transfer to RW and no assist needed. Patient ambulated ~60' with RW and min guard/assist with cues for posture and walker management. Educated pt on benefits of mobilizing for joint health and encouraged to increase activity as tolerable. Will continue to benefit from skilled PT interventions and continue to recommend HHPT when pt medically ready for discharge home. ? ?  ?Recommendations for follow up therapy are one component of a multi-disciplinary discharge planning process, led by the attending physician.  Recommendations may be updated based on  patient status, additional functional criteria and insurance authorization. ? ?Follow Up Recommendations ? Home health PT ?  ?  ?Assistance Recommended at Discharge Intermittent Supervision/Assistance  ?Patient can return home with the following A little help with walking and/or transfers;A little help with bathing/dressing/bathroom;Assistance with cooking/housework;Assist for transportation;Help with stairs or ramp for entrance ?  ?Equipment Recommendations ? Rolling walker (2 wheels)  ?  ?Recommendations for Other Services   ? ? ?  ?Precautions / Restrictions Precautions ?Precautions: Fall ?Restrictions ?Weight Bearing Restrictions: No  ?  ? ?Mobility ? Bed Mobility ?Overal bed mobility: Needs Assistance ?Bed Mobility: Sit to Supine ?  ?  ?  ?Sit to supine: Min guard ?  ?General bed mobility comments: pt taking extra time, cues for use of bed rail ?  ? ?Transfers ?Overall transfer level: Needs assistance ?Equipment used: Rolling walker (2 wheels) ?Transfers: Sit to/from Stand ?Sit to Stand: Min guard ?  ?  ?  ?  ?  ?General transfer comment: guarding for safety with power up from EOB, no overt LOB noted. pt required ?  ? ?Ambulation/Gait ?Ambulation/Gait assistance: Min assist, Min guard ?Gait Distance (Feet): 60 Feet ?Assistive device: Rolling walker (2 wheels) ?Gait Pattern/deviations: Decreased stance time - left, Step-through pattern, Decreased stride length, Decreased dorsiflexion - left, Knee flexed in stance - right, Knee flexed in stance - left ?Gait velocity: decr ?  ?  ?General Gait Details: cues for sequencing/RW position. gait slow and cautious. no buckling but pt with flexed knees and posture, limited by pain. ? ? ?Stairs ?  ?  ?  ?  ?  ? ? ?Wheelchair Mobility ?  ? ?Modified Rankin (Stroke Patients Only) ?  ? ? ?  ?Balance Overall balance assessment: Needs assistance ?Sitting-balance support: Feet supported ?Sitting  balance-Leahy Scale: Good ?  ?  ?Standing balance support: Reliant on assistive  device for balance, During functional activity, Bilateral upper extremity supported ?Standing balance-Leahy Scale: Fair ?  ?  ?  ?  ?  ?  ?  ?  ?  ?  ?  ?  ?  ? ?  ?Cognition Arousal/Alertness: Awake/alert ?Behavior During Therapy: [facility] for tasks assessed/performed ?Overall Cognitive Status: Within Functional Limits for tasks assessed ?  ?  ?  ?  ?  ?  ?  ?  ?  ?  ?  ?  ?  ?  ?  ?  ?  ?  ?  ? ?  ?Exercises   ? ?  ?General Comments   ?  ?  ? ?Pertinent Vitals/Pain Pain Assessment ?Pain Assessment: Faces ?Faces Pain Scale: Hurts little more ?Pain Location: Rt shoulder/UE, Lt knee ?Pain Descriptors / Indicators: Discomfort, Grimacing, Tightness ?Pain Intervention(s): Limited activity within patient's tolerance, Monitored during session, Repositioned  ? ? ?Home Living   ?  ?  ?  ?  ?  ?  ?  ?  ?  ?   ?  ?Prior Function    ?  ?  ?   ? ?PT Goals (current goals can now be found in the care plan section) Acute Rehab PT Goals ?Patient Stated Goal: Regain IND; less pain ?PT Goal Formulation: With patient ?Time For Goal Achievement: 12/10/21 ?Potential to Achieve Goals: Good ?Progress towards PT goals: Progressing toward goals ? ?  ?Frequency ? ? ? Min 3X/week ? ? ? ?  ?PT Plan Current plan remains appropriate  ? ? ?Co-evaluation   ?  ?  ?  ?  ? ?  ?AM-PAC PT "6 Clicks" Mobility   ?Outcome Measure ? Help needed turning from your back to your side while in a flat bed without using bedrails?: None ?Help needed moving from lying on your back to sitting on the side of a flat bed without using bedrails?: A Little ?Help needed moving to and from a bed to a chair (including a wheelchair)?: A Little ?Help needed standing up from a chair using your arms (e.g., wheelchair or bedside chair)?: A Little ?Help needed to walk in hospital room?: A Little ?Help needed climbing 3-5 steps with a railing? : A Lot ?6 Click Score: 18 ? ?  ?End of Session Equipment Utilized During Treatment: Gait belt ?Activity Tolerance: Patient tolerated treatment  well ?Patient left: in bed;with call bell/phone within reach;with bed alarm set ?Nurse Communication: Mobility status ?PT Visit Diagnosis: Difficulty in walking, not elsewhere classified (R26.2);Pain ?Pain - part of body: Knee;Shoulder ?  ? ? ?Time: 7824-2353 ?PT Time Calculation (min) (ACUTE ONLY): 22 min ? ?Charges:  $Gait Training: 8-22 mins          ?          ? ?Renaldo Fiddler PT, DPT ?Acute Rehabilitation Services ?Office [phone] ?Pager [phone]  ? ? ?Anitra Lauth ?12/01/2021, 2:51 PM ? ?

## 2021-12-01 NOTE — Progress Notes (Signed)
Inpatient Diabetes Program Recommendations ? ?AACE/ADA: New Consensus Statement on Inpatient Glycemic Control (2015) ? ?Target Ranges:  Prepandial:   less than 140 mg/dL ?     Peak postprandial:   less than 180 mg/dL (1-2 hours) ?     Critically ill patients:  140 - 180 mg/dL  ? ?Lab Results  ?Component Value Date  ? GLUCAP 165 (H) 12/01/2021  ? HGBA1C 8.6 (H) 11/23/2021  ? ? ?Review of Glycemic Control ? ? ?Inpatient Diabetes Program Recommendations:   ? ?For discharge: ? ?Novolin ReliOn 70/30 Flexpen 14 units BID ?(Order # [id number]) ?Insulin pen needles (Order # [id number]) ? ?Pt has meter and supplies at home. ? ?Sent insulin pen video demonstration to pt and wife. ? ?Reviewed hypoglycemia s/s and treatment. ?Pt instructed to monitor blood sugars at least 2x/day ? ?Thank you. ?Ailene Ards, RD, LDN, CDE ?Inpatient Diabetes Coordinator ?[phone]  ? ? ? ? ? ? ? ?

## 2021-12-01 NOTE — Progress Notes (Signed)
?   ? ? ? ? ?[place] for Infectious Disease ? ?Date of Admission:  11/22/2021   Total days of inpatient antibiotics 9 ?Principal Problem: ?  Severe sepsis ([place]) ?Active Problems: ?  Thrombocytopenia ([place]) ?  Type 2 diabetes mellitus ([place]) ?  CAP (community acquired pneumonia) ?  High anion gap metabolic acidosis ?  Septic arthritis ([place]) ?  Transaminitis ?  Pneumococcal bacteremia ?  Hypokalemia ?  Hypophosphatemia ?  DVT (deep venous thrombosis) ([place]) ?  Constipation ?  Septic arthritis of left sternoclavicular joint ([place]) ?  Bilateral leg edema ?     ?    ?Assessment: ?75 YM with strep pneumo bacteremia and multiple  septic joints and pyomyositis. ? ?#Streptococcus pneumoniae bacteremia ?#Polyarticular septic arthritis, right shoulder septic arthritis with right shoulder septic joint, septic sternoclavicular joint multiple areas as of pyomyositis on imaging ?#Left knee swelling ?#RUE superficial thermobiosis-cephalic veing, LLE indeterminate DVT, acute DVT left gastroc and posterior tibial vein-on AC ?-Seen by Ortho on 4/13 and multiple areas examined. . No fluid was able to be aspirated around subacromial outlet, shoulder, glenohumeral joint. No fluid sound on aspiration of right shoulder and left knee on 4/12. Felt to fit myositis picture. No current plans for operative intervention.  ?-Strep pneumo is an uncommon cause for endocarditis. TTE did not show Given pt's multiple foci on infection(septic emboli?) would get TEE. Treat with 6 weeks for possible endovascular infection.  ?Recommendations: ?-Continue Penicillin x  6 weeks for endovascular coverage ?-Place PICC ?-TEE can be done  outpatient ?-Follow-up in ID clinic in 3 weeks(hopefully TEE wound be complete by then) .  ?-Follow-up with Ortho  ?OPAT ORDERS: ? ?Diagnosis: septic arthitis/strep pneumo bacteremia ? ?Culture Result: step pneumo + blood Cx ? ?Allergies  ?Allergen Reactions  ? Codeine Itching  ? Glipizide Er [Glipizide]   ?  SOB, malaise.  He tolerates XL without issues.  ? Metformin And Related Rash  ? ? ? ?Discharge antibiotics to be given via PICC line: ? ?Per pharmacy protocol  PEN ?Aim for Vancomycin trough 15-20 or AUC 400-550 (unless otherwise indicated) ? ? ?Duration: ?6 weeks ?End Date: ?01/03/22 ? ?[facility] Care Per Protocol with Biopatch Use: ?Home health RN for IV administration and teaching, line care and labs.   ? ?Labs weekly while on IV antibiotics: ?__ CBC with differential ?__ BMP **TWICE WEEKLY ON VANCOMYCIN  ?__ CMP ?__ CRP ?__ ESR ?__ Vancomycin trough TWICE WEEKLY ?__ CK ? ?__ Please pull PIC at completion of IV antibiotics ?__ Please leave PIC in place until doctor has seen patient or been notified ? ?Fax weekly labs to [phone] ? ?Clinic Follow Up Appt: ?4/25 ? ?@ [facility] with Dr. Candiss Norse  ?Microbiology:   ?Antibiotics: ?Azithormycin 4/5 ?Cefepime 4/5 ?Ceftriaxone 4/5-p ?Cultures: ?Blood ?4/5 2/2 strep pneumo ?4/6 NG ?4/7 NG ? ? ? ?SUBJECTIVE: ?Sitting in chair, No new complaints.  ? ?Review of Systems: ?Review of Systems  ?All other systems reviewed and are negative. ? ? ?Scheduled Meds: ? apixaban  10 mg Oral BID  ? Followed by  ? [START ON 12/04/2021] apixaban  5 mg Oral BID  ? insulin aspart  0-20 Units Subcutaneous TID WC  ? insulin aspart  0-5 Units Subcutaneous QHS  ? insulin aspart  6 Units Subcutaneous TID WC  ? insulin glargine-yfgn  20 Units Subcutaneous Daily  ? lidocaine  1 patch Transdermal Q24H  ? multivitamin with minerals  1 tablet Oral Daily  ? polyethylene glycol  17 g Oral BID  ? Ensure Max Protein  11 oz Oral BID  ? senna-docusate  2 tablet Oral Daily  ? ?Continuous Infusions: ? penicillin g continuous IV infusion 12 Million Units (12/01/21 0139)  ? ?PRN Meds:.acetaminophen **OR** [DISCONTINUED] acetaminophen, benzonatate, bisacodyl, naLOXone (NARCAN)  injection, oxyCODONE ?Allergies  ?Allergen Reactions  ? Codeine Itching  ? Glipizide Er [Glipizide]   ?  SOB, malaise. He tolerates XL without issues.  ? Metformin  And Related Rash  ? ? ?OBJECTIVE: ?Vitals:  ? 11/30/21 0501 11/30/21 1535 11/30/21 2207 12/01/21 0513  ?BP: 137/83 139/85 (!) 147/85 133/86  ?Pulse: (!) 103 99 (!) 110 (!) 107  ?Resp: '15 18 18 18  ' ?Temp: 99.4 ?F (37.4 ?C) 98.2 ?F (36.8 ?C) 99.3 ?F (37.4 ?C) 98.9 ?F (37.2 ?C)  ?TempSrc: Oral Oral Oral Oral  ?SpO2: 93% 95% 94% 97%  ?Weight:      ?Height:      ? ?Body mass index is 32.28 kg/m?. ? ?Physical Exam ?Constitutional:   ?   General: He is not in acute distress. ?   Appearance: He is normal weight. He is not toxic-appearing.  ?HENT:  ?   Head: Normocephalic and atraumatic.  ?   Right Ear: External ear normal.  ?   Left Ear: External ear normal.  ?   Nose: No congestion or rhinorrhea.  ?   Mouth/Throat:  ?   Mouth: Mucous membranes are moist.  ?   Pharynx: Oropharynx is clear.  ?Eyes:  ?   Extraocular Movements: Extraocular movements intact.  ?   Conjunctiva/sclera: Conjunctivae normal.  ?   Pupils: Pupils are equal, round, and reactive to light.  ?Cardiovascular:  ?   Rate and Rhythm: Normal rate and regular rhythm.  ?   Heart sounds: No murmur heard. ?  No friction rub. No gallop.  ?Pulmonary:  ?   Effort: Pulmonary effort is normal.  ?   Breath sounds: Normal breath sounds.  ?Abdominal:  ?   General: Abdomen is flat. Bowel sounds are normal.  ?   Palpations: Abdomen is soft.  ?Musculoskeletal:     ?   General: No swelling. Normal range of motion.  ?   Cervical back: Normal range of motion and neck supple.  ?Skin: ?   General: Skin is warm and dry.  ?Neurological:  ?   General: No focal deficit present.  ?   Mental Status: He is oriented to person, place, and time.  ?Psychiatric:     ?   Mood and Affect: Mood normal.  ? ? ? ? ?Lab Results ?Lab Results  ?Component Value Date  ? WBC 13.1 (H) 11/29/2021  ? HGB 13.1 11/29/2021  ? HCT 39.1 11/29/2021  ? MCV 87.9 11/29/2021  ? PLT 234 11/29/2021  ?  ?Lab Results  ?Component Value Date  ? CREATININE 0.65 12/01/2021  ? BUN 12 12/01/2021  ? NA 128 (L) 12/01/2021  ? K  3.4 (L) 12/01/2021  ? CL 93 (L) 12/01/2021  ? CO2 26 12/01/2021  ?  ?Lab Results  ?Component Value Date  ? ALT 52 (H) 12/01/2021  ? AST 56 (H) 12/01/2021  ? GGT 141 (H) 11/23/2021  ? ALKPHOS 297 (H) 12/01/2021  ? BILITOT 1.3 (H) 12/01/2021  ?  ? ? ? ? ?Laurice Record, MD ?[facility] ?[facility] ?12/01/2021, 11:20 AM  ?

## 2021-12-01 NOTE — Progress Notes (Signed)
Peripherally Inserted Central Catheter Placement ? ?The IV Nurse has discussed with the patient and/or persons authorized to consent for the patient, the purpose of this procedure and the potential benefits and risks involved with this procedure.  The benefits include less needle sticks, lab draws from the catheter, and the patient may be discharged home with the catheter. Risks include, but not limited to, infection, bleeding, blood clot (thrombus formation), and puncture of an artery; nerve damage and irregular heartbeat and possibility to perform a PICC exchange if needed/ordered by physician.  Alternatives to this procedure were also discussed.  Bard Power PICC patient education guide, fact sheet on infection prevention and patient information card has been provided to patient /or left at bedside.   ? ?PICC Placement Documentation  ?PICC Single Lumen 12/01/21 Left Basilic 49 cm 0 cm (Active)  ?Indication for Insertion or Continuance of Line Home intravenous therapies (PICC only) 12/01/21 1602  ?Exposed Catheter (cm) 0 cm 12/01/21 1602  ?Site Assessment Clean, Dry, Intact 12/01/21 1602  ?Line Status Flushed;Saline locked;Blood return noted 12/01/21 1602  ?Dressing Type Securing device;Transparent 12/01/21 1602  ?Dressing Status Antimicrobial disc in place 12/01/21 1602  ?Dressing Intervention New dressing;Other (Comment) 12/01/21 1602  ?Dressing Change Due 12/08/21 12/01/21 1602  ? ? ? ? ? ?Reginia Forts Albarece ?12/01/2021, 4:03 PM ? ?

## 2021-12-01 NOTE — Progress Notes (Addendum)
Inpatient Diabetes Program Recommendations ? ?AACE/ADA: New Consensus Statement on Inpatient Glycemic Control (2015) ? ?Target Ranges:  Prepandial:   less than 140 mg/dL ?     Peak postprandial:   less than 180 mg/dL (1-2 hours) ?     Critically ill patients:  140 - 180 mg/dL  ? ?Lab Results  ?Component Value Date  ? GLUCAP 212 (H) 12/01/2021  ? HGBA1C 8.6 (H) 11/23/2021  ? ? ?Review of Glycemic Control ? ?Diabetes history: DM2 ?Outpatient Diabetes medications: glipizide 10 QAM, not taking [place] ?Current orders for Inpatient glycemic control: Semglee 20 units QD, Novolog 0-20 units TID with meals and 0-5 HS + 6 units TID ? ?HgbA1C - 8.6% ?FBS 212 mg/dL this am ? ?Inpatient Diabetes Program Recommendations:   ? ?Consider increasing Semglee to 22 units QD ? ?Spoke with pt at bedside regarding insulin and possibility of going home on insulin. Pt was very opposed to the idea of taking insulin at home and stated his diet is so much healthier at home and his blood sugars are good. Now he is open to going home on insulin if needed. States he wants to feel better.  ? ?Will order insulin pen starter kit and teach insulin pen administration. Pt agrees. ? ?Thank you. ?Lorenda Peck, RD, LDN, CDE ?Inpatient Diabetes Coordinator ?[phone] ? ?Addendum: ? ?Spoke with Dr Darrick Meigs regarding pt going home on insulin. Recommend Novolin ReliOn 70/30 flexpen 14 units BID. Will need insulin pen needles. Pt has meter and supplies.  ? ?Educated patient on insulin pen use at home. Reviewed contents of insulin flexpen starter kit. Reviewed all steps if insulin pen including attachment of needle, 2-unit air shot, dialing up dose, giving injection, removing needle, disposal of sharps, storage of unused insulin, disposal of insulin etc. Patient able to provide successful return demonstration. Also reviewed troubleshooting with insulin pen. MD to give patient Rxs for insulin pens and insulin pen needles. Sent insulin pen demo video to pt and  wife for reference.  ? ? ? ?RV ?  ? ? ? ? ?

## 2021-12-02 DIAGNOSIS — I82402 Acute embolism and thrombosis of unspecified deep veins of left lower extremity: Secondary | ICD-10-CM | POA: Diagnosis not present

## 2021-12-02 DIAGNOSIS — R7881 Bacteremia: Secondary | ICD-10-CM | POA: Diagnosis not present

## 2021-12-02 DIAGNOSIS — J189 Pneumonia, unspecified organism: Secondary | ICD-10-CM | POA: Diagnosis not present

## 2021-12-02 DIAGNOSIS — A419 Sepsis, unspecified organism: Secondary | ICD-10-CM | POA: Diagnosis not present

## 2021-12-02 DIAGNOSIS — R6520 Severe sepsis without septic shock: Secondary | ICD-10-CM | POA: Diagnosis not present

## 2021-12-02 LAB — HEPATIC FUNCTION PANEL
ALT: 48 U/L — ABNORMAL HIGH (ref 0–44)
AST: 52 U/L — ABNORMAL HIGH (ref 15–41)
Albumin: 1.9 g/dL — ABNORMAL LOW (ref 3.5–5.0)
Alkaline Phosphatase: 264 U/L — ABNORMAL HIGH (ref 38–126)
Bilirubin, Direct: 0.5 mg/dL — ABNORMAL HIGH (ref 0.0–0.2)
Indirect Bilirubin: 1.2 mg/dL — ABNORMAL HIGH (ref 0.3–0.9)
Total Bilirubin: 1.7 mg/dL — ABNORMAL HIGH (ref 0.3–1.2)
Total Protein: 6.2 g/dL — ABNORMAL LOW (ref 6.5–8.1)

## 2021-12-02 LAB — BASIC METABOLIC PANEL
Anion gap: 9 (ref 5–15)
BUN: 11 mg/dL (ref 8–23)
CO2: 28 mmol/L (ref 22–32)
Calcium: 8 mg/dL — ABNORMAL LOW (ref 8.9–10.3)
Chloride: 94 mmol/L — ABNORMAL LOW (ref 98–111)
Creatinine, Ser: 0.73 mg/dL (ref 0.61–1.24)
GFR, Estimated: >60 mL/min (ref >60)
Glucose, Bld: 195 mg/dL — ABNORMAL HIGH (ref 70–99)
Potassium: 3.6 mmol/L (ref 3.5–5.1)
Sodium: 131 mmol/L — ABNORMAL LOW (ref 135–145)

## 2021-12-02 LAB — CBC
HCT: 36.4 % — ABNORMAL LOW (ref 39.0–52.0)
Hemoglobin: 12.3 g/dL — ABNORMAL LOW (ref 13.0–17.0)
MCH: 29.9 pg (ref 26.0–34.0)
MCHC: 33.8 g/dL (ref 30.0–36.0)
MCV: 88.6 fL (ref 80.0–100.0)
Platelets: 358 10*3/uL (ref 150–400)
RBC: 4.11 MIL/uL — ABNORMAL LOW (ref 4.22–5.81)
RDW: 13.9 % (ref 11.5–15.5)
WBC: 10.5 10*3/uL (ref 4.0–10.5)
nRBC: 0 % (ref 0.0–0.2)

## 2021-12-02 LAB — GLUCOSE, CAPILLARY
Glucose-Capillary: 201 mg/dL — ABNORMAL HIGH (ref 70–99)
Glucose-Capillary: 310 mg/dL — ABNORMAL HIGH (ref 70–99)
Glucose-Capillary: 162 mg/dL — ABNORMAL HIGH (ref 70–99)

## 2021-12-02 MED ORDER — NOVOLIN 70/30 FLEXPEN RELION (70-30) 100 UNIT/ML ~~LOC~~ SUPN
14.0000 [IU] | PEN_INJECTOR | Freq: Two times a day (BID) | SUBCUTANEOUS | 3 refills | Status: DC
Start: 2021-12-02 — End: 2021-12-27

## 2021-12-02 MED ORDER — SIMETHICONE 80 MG PO CHEW
80.0000 mg | CHEWABLE_TABLET | Freq: Four times a day (QID) | ORAL | Status: DC
  Administered 2021-12-02 – 2021-12-17 (×39): 80 mg via ORAL
  Filled 2021-12-02 (×45): qty 1

## 2021-12-02 MED ORDER — ACETAMINOPHEN 325 MG PO TABS: 650.0000 mg | ORAL_TABLET | Freq: Four times a day (QID) | ORAL | Status: AC | PRN

## 2021-12-02 MED ORDER — POLYETHYLENE GLYCOL 3350 17 G PO PACK: 17.0000 g | PACK | Freq: Every day | ORAL | 0 refills | Status: AC | PRN

## 2021-12-02 MED ORDER — APIXABAN 5 MG PO TABS: 5.0000 mg | ORAL_TABLET | Freq: Two times a day (BID) | ORAL | 2 refills | Status: DC

## 2021-12-02 MED ORDER — "PEN NEEDLES 3/16"" 31G X 5 MM MISC": 14.0000 [IU] | Freq: Two times a day (BID) | 3 refills | Status: AC

## 2021-12-02 MED ORDER — BENZONATATE 200 MG PO CAPS: 200.0000 mg | ORAL_CAPSULE | Freq: Three times a day (TID) | ORAL | 0 refills | Status: DC | PRN

## 2021-12-02 MED ORDER — OXYCODONE HCL 5 MG PO TABS: 5.0000 mg | ORAL_TABLET | ORAL | 0 refills | Status: DC | PRN

## 2021-12-02 MED ORDER — ALUM & MAG HYDROXIDE-SIMETH 200-200-20 MG/5ML PO SUSP: 15.0000 mL | ORAL | Status: DC | PRN

## 2021-12-02 NOTE — Progress Notes (Signed)
Physical Therapy Treatment ?Patient Details ?Name: David Strong ?MRN: [id number] ?DOB: [birth year] ?Today's Date: 12/02/2021 ? ? ?History of Present Illness Pt. is a 66 y.o. M presenting to [facility] 4/5 Admitted for severe sepsis, POA due to streptococcal pneumonia with bacteremia, left sternoclavicular joint septic arthritis and other large joint arthralgia/arthritis (right shoulder, left knee). PMH significant for T2DM.  CTA showed no PE, confirmed multifocal pneumonia and MRI shows left sternoclavicular joint inflammatory changes with scattered foci of air within the soft tissues and sternoclavicular joint space compatible with localized left sternoclavicular septic arthritis/cellulitis, without evidence of drainable fluid collection or large joint effusion.  Subsequent BCID and urine antigen positive for strep.  Antibiotics narrowed to IV ceftriaxone.  IR ultrasound shows +RUE superficial thrombosis and L LE DVT. ? ?  ?PT Comments  ? ? Focus of session today was functional transfers, gait, and standing mock stair trial with high stepping. The patient tolerated well and ambulated 50 ft with RW and antalgic gait. He was also able to perform high marching with RW and educated on proper sequencing for going up/down stairs.  Pt. Shows overall improvement with his functional balance and transfer ability. Overall pain levels, strength, functional mobility, balance, and endurance are still limiting function. Pt. Would benefit from skilled PT to continue to maximize independence, improve strength, and balance. Plan and discharge setting remains unchanged. Pt to follow acutely as appropriate.  ?   ?Recommendations for follow up therapy are one component of a multi-disciplinary discharge planning process, led by the attending physician.  Recommendations may be updated based on patient status, additional functional criteria and insurance authorization. ? ?Follow Up Recommendations ? Home health PT ?  ?  ?Assistance Recommended at  Discharge Intermittent Supervision/Assistance  ?Patient can return home with the following A little help with walking and/or transfers;A little help with bathing/dressing/bathroom;Assistance with cooking/housework;Assist for transportation;Help with stairs or ramp for entrance ?  ?Equipment Recommendations ? Rolling walker (2 wheels)  ?  ?Recommendations for Other Services   ? ? ?  ?Precautions / Restrictions Precautions ?Precautions: Fall ?Restrictions ?Weight Bearing Restrictions: No  ?  ? ?Mobility ? Bed Mobility ?Overal bed mobility: Needs Assistance ?  ?  ?  ?  ?  ?  ?General bed mobility comments: pt in chair upon PT arrival ?  ? ?Transfers ?Overall transfer level: Needs assistance ?Equipment used: Rolling walker (2 wheels) ?Transfers: Sit to/from Stand ?Sit to Stand: Min guard ?  ?  ?  ?  ?  ?General transfer comment: for saftey, cues for hand placement during power up ?  ? ?Ambulation/Gait ?Ambulation/Gait assistance: Min guard ?Gait Distance (Feet): 50 Feet ?Assistive device: Rolling walker (2 wheels) ?Gait Pattern/deviations: Decreased stance time - left, Decreased stride length, Decreased dorsiflexion - left, Knee flexed in stance - right, Knee flexed in stance - left, Step-through pattern ?Gait velocity: decr ?  ?Pre-gait activities: standing high marches to simulate stairs in RW ?General Gait Details: Pt with very slow but steady gait, shows decreased WB and swing in L LE secondary to pain. ? ? ? ? ? ?  ?Balance Overall balance assessment: Needs assistance ?Sitting-balance support: Feet supported ?Sitting balance-Leahy Scale: Fair ?Sitting balance - Comments: Pt. able to sit in chair upright w/o difficulty ?  ?Standing balance support: Reliant on assistive device for balance, During functional activity, Bilateral upper extremity supported ?Standing balance-Leahy Scale: Poor ?Standing balance comment: Requires RW for standing and dynamic movements ?  ?  ?  ?  ?  ?  ?  ?  ?  ?  ?  ?  ? ?  ?  Cognition  Arousal/Alertness: Awake/alert ?Behavior During Therapy: [facility] for tasks assessed/performed ?Overall Cognitive Status: Within Functional Limits for tasks assessed ?  ?  ?  ?  ?  ?  ?  ?  ?  ?  ?  ?  ?  ?  ?  ?  ?  ?  ?  ? ?  ?   ?General Comments General comments (skin integrity, edema, etc.): Pt. educated on proper sequencing during stair ambulation and increased need for assistance below him. ?  ?  ? ?Pertinent Vitals/Pain Pain Assessment ?Pain Score: 7  ?Pain Location: Rt shoulder/UE, Lt knee ?Pain Descriptors / Indicators: Discomfort, Grimacing, Tightness ?Pain Intervention(s): Limited activity within patient's tolerance, Monitored during session  ? ? ? ?PT Goals (current goals can now be found in the care plan section) Acute Rehab PT Goals ?Patient Stated Goal: Regain IND; less pain ?PT Goal Formulation: With patient ?Time For Goal Achievement: 12/10/21 ?Potential to Achieve Goals: Good ?Progress towards PT goals: Progressing toward goals ? ?  ?Frequency ? ? ? Min 3X/week ? ? ? ?  ?PT Plan Current plan remains appropriate  ? ? ?   ?AM-PAC PT "6 Clicks" Mobility   ?Outcome Measure ? Help needed turning from your back to your side while in a flat bed without using bedrails?: None ?Help needed moving from lying on your back to sitting on the side of a flat bed without using bedrails?: A Little ?Help needed moving to and from a bed to a chair (including a wheelchair)?: A Little ?Help needed standing up from a chair using your arms (e.g., wheelchair or bedside chair)?: A Little ?Help needed to walk in hospital room?: A Little ?Help needed climbing 3-5 steps with a railing? : A Lot ?6 Click Score: 18 ? ?  ?End of Session Equipment Utilized During Treatment: Gait belt ?Activity Tolerance: Patient tolerated treatment well ?Patient left: with call bell/phone within reach;with bed alarm set;in chair ?Nurse Communication: Mobility status ?PT Visit Diagnosis: Difficulty in walking, not elsewhere classified (R26.2);Pain ?Pain  - Right/Left:  (R shoulder, L Knee) ?Pain - part of body: Knee;Shoulder ?  ? ? ?Time: [id number] ?PT Time Calculation (min) (ACUTE ONLY): 21 min ? ?Charges:  $Therapeutic Activity: 8-22 mins          ?          ? ?Thermon Leyland, SPT ?Acute Rehab Services ? ? ? ?Thermon Leyland ?12/02/2021, 1:18 PM ? ?

## 2021-12-02 NOTE — Discharge Summary (Signed)
?Physician Discharge Summary ?  ?Patient: David Strong MRN: [id number] DOB: 08-26-1955  ?Admit date:     11/22/2021  ?Discharge date: 12/02/21  ?Discharge Physician: Oswald Hillock  ? ?PCP: Horald Pollen, MD  ? ?Recommendations at discharge:  ?Hello ?Follow-up infectious disease in 3 weeks ?Follow-up oncology Dr. Annamaria Boots in 3 months ?Patient to be discharged home on IV antibiotics, penicillin G till 01/03/2022 ?Follow-up cardiology, [facility] for TEE as outpatient ?Start taking Eliquis 5 mg p.o. twice daily from 12/04/2021 for 3 months ?Stop taking Farxiga and glipizide, start taking Novolin 70/30, 14 units subcu twice a day ? ?Discharge Diagnoses: ?Principal Problem: ?  Severe sepsis ([place]) ?Active Problems: ?  Pneumococcal bacteremia ?  CAP (community acquired pneumonia) ?  Septic arthritis ([place]) ?  DVT (deep venous thrombosis) ([place]) ?  High anion gap metabolic acidosis ?  Transaminitis ?  Thrombocytopenia ([place]) ?  Type 2 diabetes mellitus ([place]) ?  Hypokalemia ?  Hypophosphatemia ?  Constipation ?  Septic arthritis of left sternoclavicular joint ([place]) ?  Bilateral leg edema ? ?Resolved Problems: ?  * No resolved hospital problems. * ? ?Hospital Course: ?66 year old male with medical history significant for type II DM, presented to the ED with a couple days history of progressive productive cough, subjective fevers, chills, myalgia, severe arthralgia and new onset erythema and swelling on the left side of the base of the neck.  CTA showed no PE, confirmed RML and bibasilar airspace opacities consistent with multifocal pneumonia and also left sternoclavicular joint inflammatory changes with scattered foci of air within the soft tissues and sternoclavicular joint space compatible with localized left sternoclavicular septic arthritis/cellulitis, without evidence of drainable fluid collection or large joint effusion.  Subsequent BCID and urine antigen positive for strep.  Antibiotics narrowed to IV ceftriaxone.   Admitted for severe sepsis, POA due to streptococcal pneumonia with bacteremia, left sternoclavicular joint septic arthritis and other large joint arthralgia/arthritis (right shoulder, left knee).  Clinically improving.  Biggest issue is now arthralgia of right shoulder with painful elevation.  Newly diagnosed right upper arm SVT and LLE DVT, briefly on IV heparin, transitioned to Eliquis.  Awaiting MRI right shoulder to rule out septic arthritis.  Significant leg/presacral edema, initiated oral Lasix.  ID continue to follow. ? ?Assessment and Plan: ? ?Severe sepsis ?-Due to streptococcal multifocal pneumonia/bacteremia ?-Patient presented with sepsis criteria including tachypnea, tachycardia, fever ?-CT chest confirmed multifocal pneumonia ?-Also found to have polyarticular septic arthritis, right shoulder septic arthritis with right shoulder septic joint, septic sternoclavicular joint with multiple areas of bio myositis on imaging of MRI ?-Patient empirically started on IV vancomycin, ceftriaxone and Zithromax ?-BC ID and urine was positive for Streptococcus ?-Antibiotics narrowed to IV ceftriaxone and then changed to penicillin G on 4/10 ?-ID has seen the patient and recommends 6 weeks of penicillin for endovascular coverage and outpatient TEE ?-I called cardiology and they will call patient to set up outpatient TEE ?-ID will follow in 3 weeks ?  ?Presumed left sternoclavicular joint septic arthritis ?-CTA chest was concerning for septic arthritis ?-ED provider spoke to [place] surgeon on-call, who felt that case and imaging were consistent with septic arthritis of left sternoclavicular joint and recommended medical management and IV antibiotics without need for surgical intervention ?-Per ultrasound, there was minimal fluid in the left [place] joint ?-Started on antibiotics as above ?  ?Septic arthritis of right shoulder ?-MRI right shoulder was ordered as per ID recommendation ?-Which showed septic arthritis involving AC  joint and glenohumeral joint-severe myositis and pyomyositis involving subscapularis muscle, anterior deltoid muscle and distal aspect of pectoralis muscle ?-There is also myositis involving the infraspinatus muscle ?-Orthopedics consulted, spoke with Dr. Ninfa Linden; ?-He saw patient and could not aspirate any fluid from the right shoulder  ?-No surgical intervention recommended ?  ?Left ankle swelling/left knee swelling ?-MRI of left ankle was unremarkable for joint involvement only showed soft tissue inflammation; consistent with cellulitis ?-Left knee is swollen and warm to touch;  ?-Joint aspiration was attempted however no fluid obtained ?-Not consistent with septic arthritis ?  ?Community-acquired pneumonia ?-Started on antibiotics as above ?  ?DVT ?-Right upper extremity superficial vein thrombosis involving cephalic vein ?-Left lower extremity age indeterminate DVT left popliteal vein acute DVT left gastrocnemius and left posterior tibial veins ?-Patient was briefly on IV heparin, transition to Eliquis ?-Due to complaint of bilateral leg pain and swelling, obtained RLE venous Dopplers which were negative for DVT or SVT ?-I have consulted oncology for further evaluation for this patient with multiple DVTs, hypoalbuminemia, transaminitis ?-CT abdomen/pelvis was obtained which was negative for malignancy ?-Oncology, Dr. Annamaria Boots recommends to continue anticoagulation for 3 months will follow-up in 3 months ?  ?Transaminitis ?-Likely in setting of severe sepsis ?-No GI symptoms ?-Hepatitis panel negative ?-Right upper quadrant ultrasound showed gallbladder sludge with no acute findings ?-LFTs have been elevated; unclear etiology ?-GI was consulted, at this time it was felt that LFTs are elevated due to sepsis ?-Follow-up LFTs as outpatient ?  ?Anasarca ?-Patient has swelling of right upper extremity and both lower extremities ?-Albumin is down to 1.9 ?-Dietitian consulted for hypoalbuminemia ?-Started on Nuromax p.o.  twice daily, MVI with minerals ?  ?Thrombocytopenia ?-Suspect chronic or acute on chronic ?-Platelet count in 2018 was 104 ?-Oncology was consulted.  Dr. Annamaria Boots will follow patient in 3 months in clinic ?  ?Diabetes mellitus type 2 ?-Oral hypoglycemics on hold ?-Hemoglobin A1c has been elevated to 8.6, will discontinue Farxiga and glipizide ?-Start Novolin 70/30 14 units subcu twice daily ?-Follow-up PCP for further instructions. ?  ?Hypokalemia ?-Replete ?  ?Hyponatremia ?-Mild ?-Improved today ?  ?Hypophosphatemia ?-Replaced ?  ?  ?  ?  ? ? ?  ? ? ?Consultants: Infectious disease, gastroenterology, orthopedics, oncology ?Procedures performed:  ?Disposition: Home ?Diet recommendation:  ?Discharge Diet Orders (From admission, onward)  ? ?  Start     Ordered  ? 12/02/21 0000  Diet - low sodium heart healthy       ? 12/02/21 1056  ? ?  ?  ? ?  ? ? ?DISCHARGE MEDICATION: ?Allergies as of 12/02/2021   ? ?   Reactions  ? Codeine Itching  ? Glipizide Er [glipizide]   ? SOB, malaise. He tolerates XL without issues.  ? Metformin And Related Rash  ? ?  ? ?  ?Medication List  ?  ? ?STOP taking these medications   ? ?dapagliflozin propanediol 10 MG Tabs tablet ?Commonly known as: [place] ?  ?glipiZIDE 10 MG 24 hr tablet ?Commonly known as: glipiZIDE XL ?  ?ibuprofen 200 MG tablet ?Commonly known as: ADVIL ?  ?TYLENOL COLD MULTI-SYMPTOM PO ?  ? ?  ? ?TAKE these medications   ? ?Accu-Chek Aviva Plus test strip ?Generic drug: glucose blood ?Use to test blood glucose daily. ?  ?acetaminophen 325 MG tablet ?Commonly known as: TYLENOL ?Take 2 tablets (650 mg total) by mouth every 6 (six) hours as needed for mild pain, fever or headache (or Fever >/= 101). ?  ?  apixaban 5 MG Tabs tablet ?Commonly known as: ELIQUIS ?Take 1 tablet (5 mg total) by mouth 2 (two) times daily. Take 10 mg po twice daily till Sunday 12/03/21, then start taking 5 mg po twice daily from Monday 12/04/21 ?Start taking on: December 04, 2021 ?  ?benzonatate 200 MG  capsule ?Commonly known as: TESSALON ?Take 1 capsule (200 mg total) by mouth 3 (three) times daily as needed for cough. ?  ?EQ SORE THROAT SPRAY MT ?Use as directed 1 spray in the mouth or throat daily as neede

## 2021-12-02 NOTE — TOC Transition Note (Signed)
Transition of Care (TOC) - CM/SW Discharge Note ? ? ?Patient Details  ?Name: David Strong ?MRN: [id number] ?Date of Birth: January 30, 1956 ? ?Transition of Care (TOC) CM/SW Contact:  ?Anaelle Dunton, Vinnie Langton, LCSW ?Phone Number: ?12/02/2021, 12:24 PM ? ? ?Clinical Narrative:    ? ?CSW received call from Attending Physician, Dr. Mauro Kaufmann to assist with patient's discharge home.  CSW spoke with patient, but he was unable to confirm whether or not medications (IV antibiotics) were delivered to his home on 04/14.  Patient was also unable to provide CSW with his wife, David Strong contact information, as not listed in medical record.   ?CSW contacted Jeri Modena, Clinical Account Executive with [employer] [phone]) to confirm medications (IV antibiotics) were delivered to patient's home last evening.  Mrs. Ave Filter confirmed sending video for independent review to Mrs. Hlad on 04/14.  Home Health Skilled Nursing and Physical Therapy services have been arranged through [facility]. ?Mrs. Ave Filter requested to postpone patient's discharge home until 04/16, indicating that patient is not teachable, with regards to administering his own IV antibiotics.  Mrs. Ave Filter has made arrangements for [facility] to visit the patient and Mrs. Corporan in the home on 04/16, between 1:00 pm - 3:00 pm, to perform start of care, as well as ensure understanding of proper administration of IV antibiotics with patient and Mrs. Gioffre.  Pharmacy is working on prescription for Eliquis.  ? ?Final next level of care: Home w Home Health Services ?Barriers to Discharge: Barriers Resolved ? ? ?Patient Goals and CMS Choice ?Patient states their goals for this hospitalization and ongoing recovery are:: Home ?CMS Medicare.gov Compare Post Acute Care list provided to:: Patient ?Choice offered to / list presented to : Patient ? ?Discharge Placement ?  ? Home with Home Health Skilled Nursing and Physical Therapy services. ? ?Discharge Plan and  Services ?  ?Discharge Planning Services: CM Consult ?Post Acute Care Choice: NA          ?  ?  ?  ?  ? ?Social Determinants of Health (SDOH) Interventions ?  ? ? ?Readmission Risk Interventions ?   ? View : No data to display.  ?  ?  ?  ? ? ? ? ? ?

## 2021-12-02 NOTE — Progress Notes (Signed)
Patient ID: David Strong, male   DOB: [birth year], 66 y.o.   MRN: [id number] ?There has been no acute changes from an orthopedic standpoint.  The patient denies any significant pain with his right shoulder or his left knee.  He still seems to have more discomfort at the sternoclavicular joint.  I can easily flex and extend his left knee without difficulty and he can actively flex and extend his knee with no discomfort.  I put his right shoulder through abduction and rotation and there is minimal discomfort.  There is no swelling about his right shoulder or redness and there is no significant pain to palpation of the shoulder.  No further recommendations from an orthopedic standpoint. ?

## 2021-12-03 ENCOUNTER — Inpatient Hospital Stay (HOSPITAL_COMMUNITY): Payer: Medicare Other

## 2021-12-03 DIAGNOSIS — R600 Localized edema: Secondary | ICD-10-CM | POA: Diagnosis not present

## 2021-12-03 DIAGNOSIS — M86112 Other acute osteomyelitis, left shoulder: Secondary | ICD-10-CM | POA: Diagnosis not present

## 2021-12-03 DIAGNOSIS — I82402 Acute embolism and thrombosis of unspecified deep veins of left lower extremity: Secondary | ICD-10-CM | POA: Diagnosis not present

## 2021-12-03 DIAGNOSIS — A419 Sepsis, unspecified organism: Secondary | ICD-10-CM | POA: Diagnosis not present

## 2021-12-03 DIAGNOSIS — R6520 Severe sepsis without septic shock: Secondary | ICD-10-CM | POA: Diagnosis not present

## 2021-12-03 DIAGNOSIS — J189 Pneumonia, unspecified organism: Secondary | ICD-10-CM | POA: Diagnosis not present

## 2021-12-03 DIAGNOSIS — L0390 Cellulitis, unspecified: Secondary | ICD-10-CM | POA: Diagnosis not present

## 2021-12-03 LAB — GLUCOSE, CAPILLARY
Glucose-Capillary: 195 mg/dL — ABNORMAL HIGH (ref 70–99)
Glucose-Capillary: 226 mg/dL — ABNORMAL HIGH (ref 70–99)
Glucose-Capillary: 116 mg/dL — ABNORMAL HIGH (ref 70–99)

## 2021-12-03 IMAGING — CT CT CHEST W/ CM
2 of 4 series · 14 of 36 positions shown, 17 images · IV contrast (agent unspecified)
Comparison: CT chest [DATE]

CLINICAL DATA: Left-sided chest pain

EXAM:
CT CHEST WITH CONTRAST
TECHNIQUE: Multidetector CT imaging of the chest was performed during
intravenous contrast administration.

[Series 2: axial st · axial · 0.69mm/px · z∈[+1419,+1665]mm · 11 of 147 slices shown, 14 images]
[im 12/147  mediastinal]
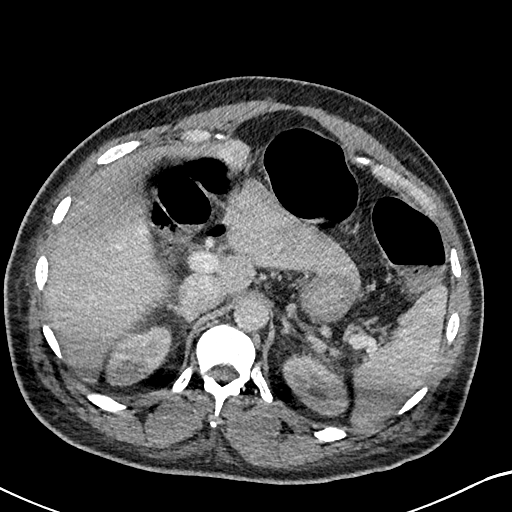
[im 12/147  lung]
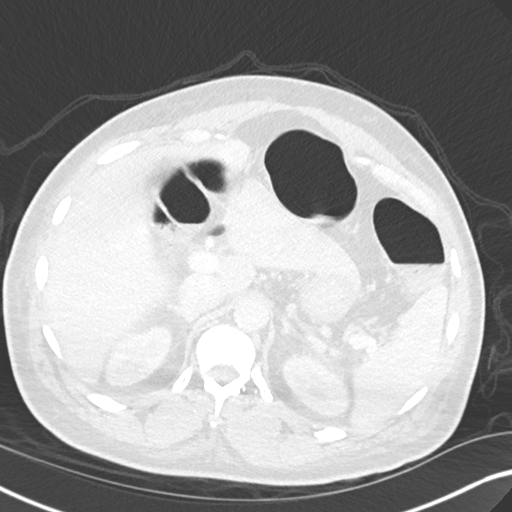
[im 23/147  lung]
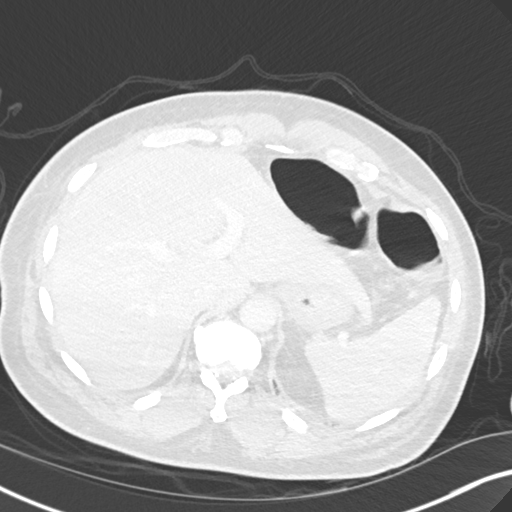
[im 34/147  lung]
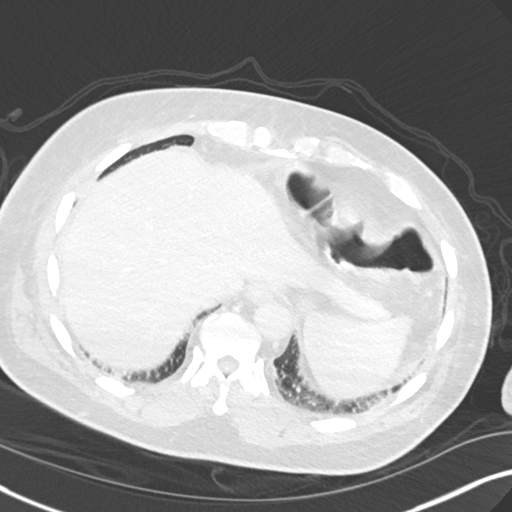
[im 45/147  lung]
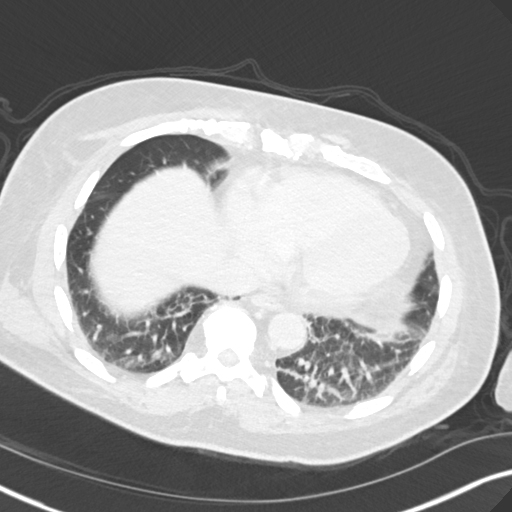
[im 57/147  mediastinal]
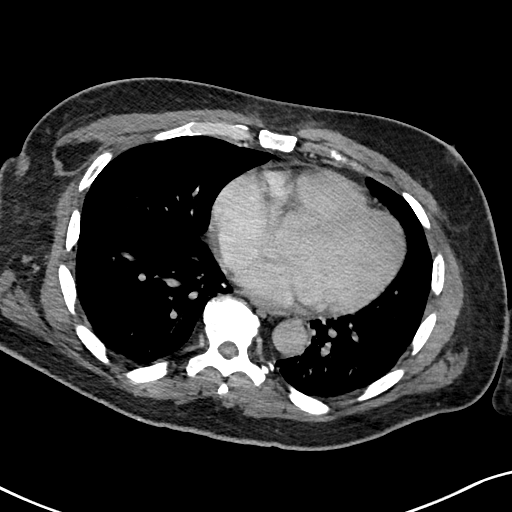
[im 57/147  lung]
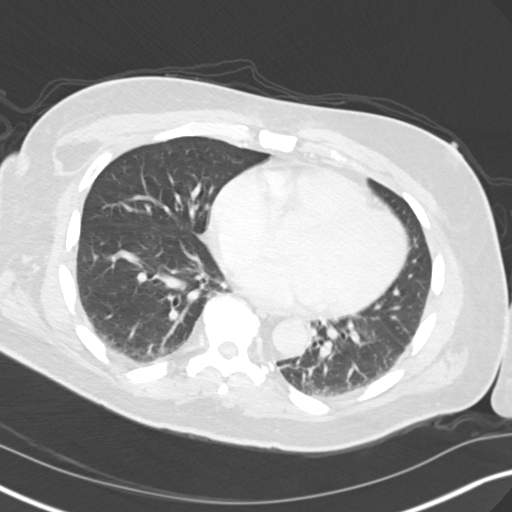
[im 79/147  lung]
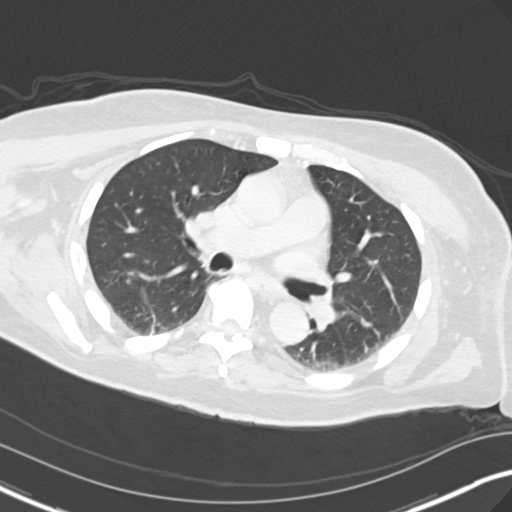
[im 90/147  lung]
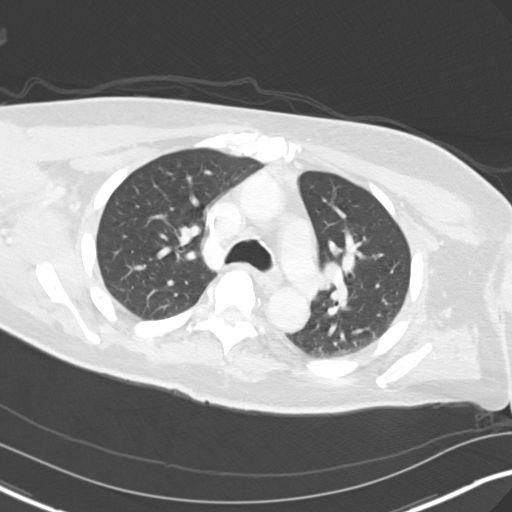
[im 102/147  lung]
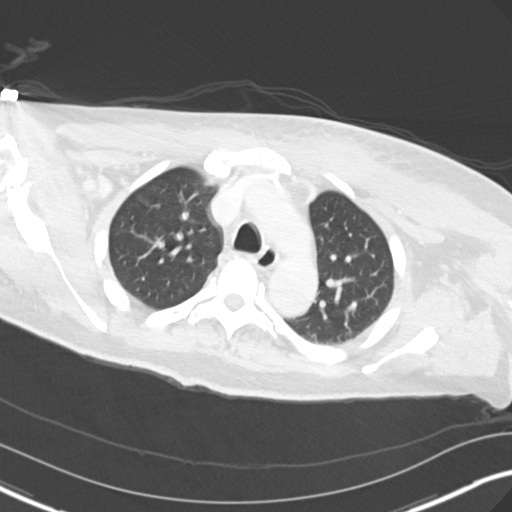
[im 113/147  mediastinal]
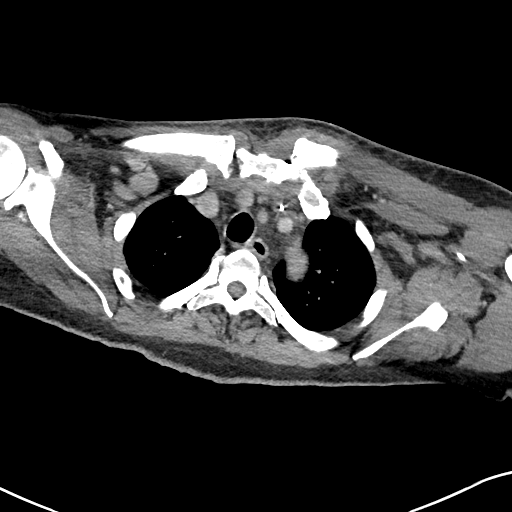
[im 113/147  lung]
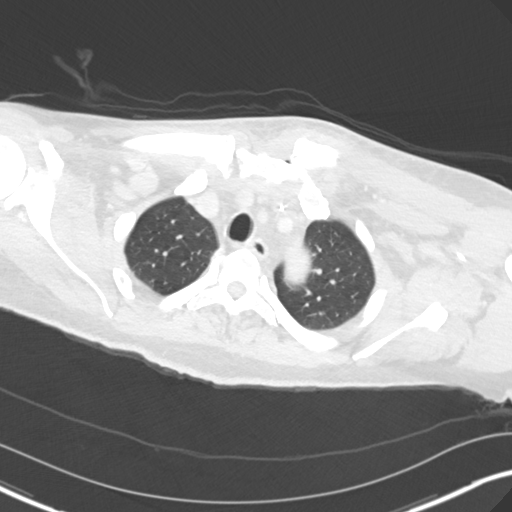
[im 124/147  lung]
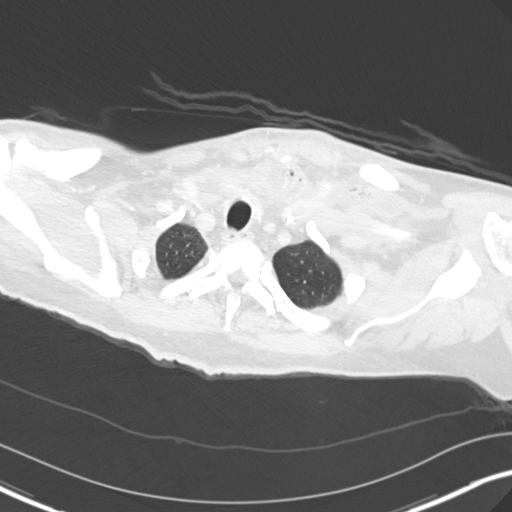
[im 135/147  lung]
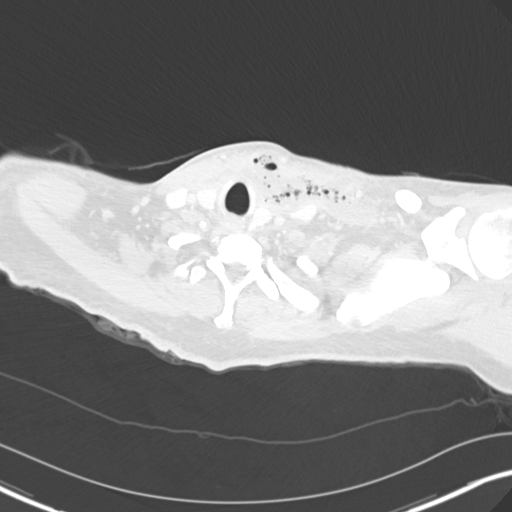

[Series 5: coronal · coronal · 0.58mm/px · 3 of 149 slices shown]
[im 30/149  lung]
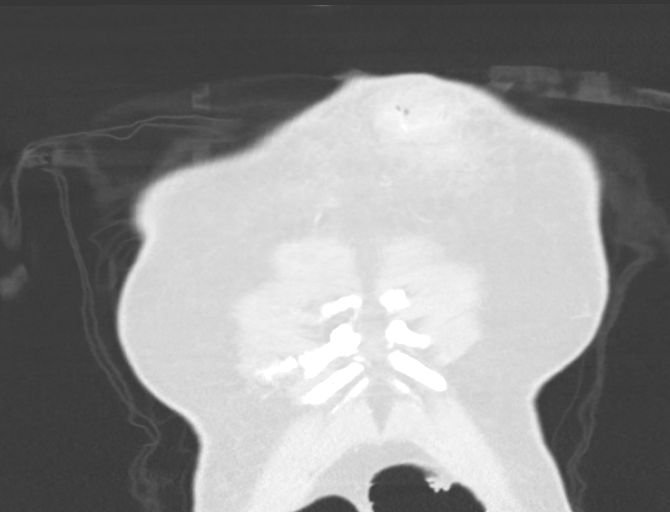
[im 60/149  lung]
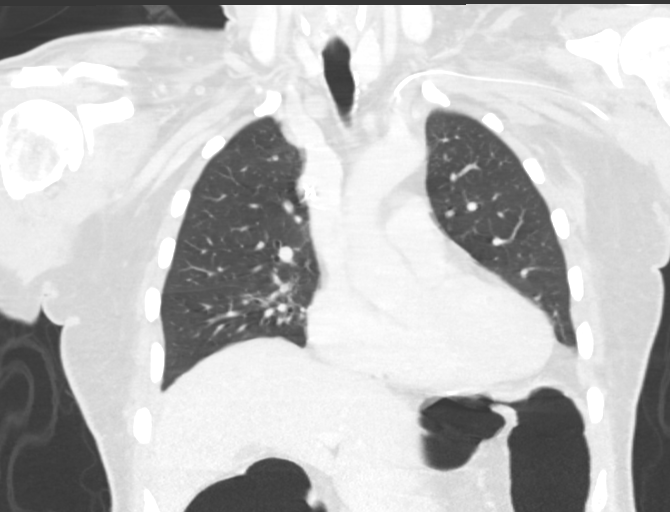
[im 89/149  lung]
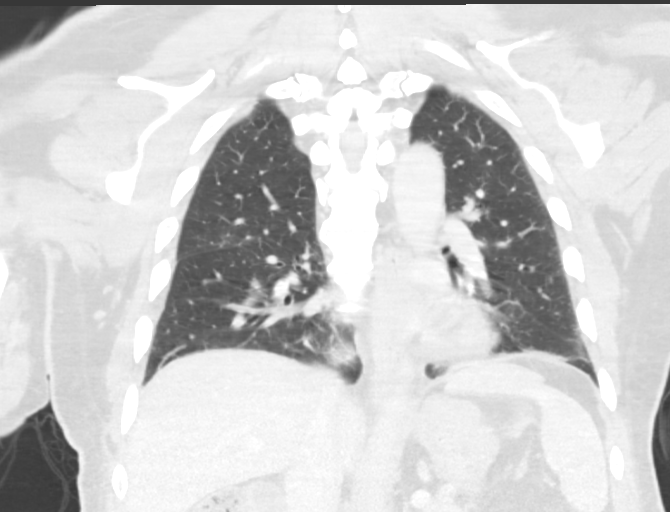

[14 of 36 positions shown; findings below may reference images not displayed]

RADIATION DOSE REDUCTION: This exam was performed according to the
departmental dose-optimization program which includes automated
exposure control, adjustment of the mA and/or kV according to
patient size and/or use of iterative reconstruction technique.

CONTRAST:  75mL OMNIPAQUE IOHEXOL 300 MG/ML  SOLN
FINDINGS: Cardiovascular: Heart size is normal. Calcifications in the LAD
coronary artery noted. No pericardial effusion.

Mediastinum/Nodes: No enlarged mediastinal, hilar, or axillary lymph
nodes. Thyroid gland, trachea, and esophagus demonstrate no
significant findings.

Lungs/Pleura: No pleural effusion. Mild subsegmental atelectasis in
the lung bases. Patchy area of ground-glass attenuation noted in the
central right middle lobe compatible with nonspecific pneumonitis.
No airspace consolidation

Upper Abdomen: No acute abnormality.

Musculoskeletal: There has been progressive anterior subluxation of
the left sternoclavicular joint. New erosive changes with bony
fragmentation noted along the medial articular surface of the
proximal left clavicle. Gas is now seen within the proximal aspect
of the left clavicle, image 33/2. Progressive gas and fluid
collection is noted centered around the left supraclavicular joint
which now extends into the left side of neck. This measures 7.9 x
3.1 by 4.7 cm, image 43/5 and image [DATE]. This has mass effect upon
the left common carotid artery and left internal jugular vein, image
[DATE]. There is also a fluid collection which tracks along the
posterior aspect of the left clavicle measuring approximately 5.5 by
2.1 by 1.9 cm. Involvement of the left pectoralis major muscle which
is asymmetrically enlarged and contains foci of gas and fluid, image
43/2. Fluid also extends across the midline anterior to the thyroid
gland into the right-side of neck, image [DATE].
IMPRESSION: 1. Progressive anterior subluxation of the left sternoclavicular
joint with new erosive changes with bony fragmentation along the
medial articular surface of the proximal left clavicle. Findings are
worrisome for septic arthritis.
2. Progressive gas and fluid collection centered around the left
supraclavicular joint which now extends into the left side of neck.
There is also involvement of the left pectoralis major muscle which
is asymmetrically enlarged and contains foci of gas and fluid. Fluid
also is seen extending across the midline anterior to the thyroid
gland into the right lower neck. Findings compatible with abscess
formation.
3. LAD coronary artery calcifications.
4. Patchy area of ground-glass attenuation noted in the central
right middle lobe compatible with nonspecific pneumonitis.
5. Aortic Atherosclerosis ([3J]-[3J]).

Critical Value/emergent results were called by telephone at the time
of interpretation on [DATE] at [DATE] to provider [id number] ,
who verbally acknowledged these results.

## 2021-12-03 MED ORDER — HYDRALAZINE HCL 25 MG PO TABS
25.0000 mg | ORAL_TABLET | Freq: Four times a day (QID) | ORAL | Status: DC | PRN
  Administered 2021-12-03 – 2021-12-06 (×2): 25 mg via ORAL
  Filled 2021-12-03 (×2): qty 1

## 2021-12-03 MED ORDER — IOHEXOL 300 MG/ML  SOLN
75.0000 mL | Freq: Once | INTRAMUSCULAR | Status: AC | PRN
  Administered 2021-12-03: 75 mL via INTRAVENOUS

## 2021-12-03 MED ORDER — HYDRALAZINE HCL 25 MG PO TABS: 25.0000 mg | ORAL_TABLET | Freq: Four times a day (QID) | ORAL | Status: DC | PRN

## 2021-12-03 MED ORDER — SODIUM CHLORIDE (PF) 0.9 % IJ SOLN
INTRAMUSCULAR | Status: AC
  Filled 2021-12-03: qty 50

## 2021-12-03 NOTE — Progress Notes (Signed)
Pt made primary RN aware of an area around left collar bone that "pops up after pressing on it". Stated that he noticed it "about 30 mins ago". Primary RN and charge RN assessed area and noted patient's concern. Pt noted to be in no signs of distress and denied any pain. Charge RN advised to primary RN to pass assessment along to dayshift team. Pt noted to be resting comfortably. Plan of care ongoing.  ?

## 2021-12-03 NOTE — Progress Notes (Signed)
[facility] ? ?PROGRESS NOTE ? ?David Strong ZOX:[id number] DOB: 09/23/1955 DOA: 11/22/2021 ?PCP: Georgina QuintSagardia, [name], MD ? ? ?Brief HPI:   ?66 year old male with medical history of diabetes mellitus type 2 presented to the ED with complaints of productive cough, subjective fever, chills, myalgia, severe arthralgia, new onset erythema and swelling of the left side of the base of the neck.  CTA showed no PE, confirmed right middle lobe pneumonia and bibasilar airspace opacities consistent with multifocal pneumonia and left sternoclavicular joint inflammatory changes with scattered foci of air within the soft tissue and sternoclavicular joint space compatible with left localized sternoclavicular septic arthritis/cellulitis, without evidence of drainable fluid collection or large joint effusion.  Subsequent BC ID and urine antigen positive for strep.  Antibiotics were narrowed down to ceftriaxone.  Newly diagnosed right upper arm SVT and left lower extremity DVT, briefly on IV heparin transition to Eliquis. ? ?MRI right shoulder findings suspicious for septic arthritis involving AC joint and glenohumeral joint.  Severe myositis and pyomyositis involving the subscapularis muscle, the anterior deltoid muscle and distal aspect of pectoralis muscle.  There is also myositis involving the infraspinatus muscle.   ? ? ? ? ?Subjective  ? ?Patient seen and examined, he was to be discharged today home as per ID recommendation for 6 weeks of antibiotics.  Today complains of crepitus at left sternoclavicular joint. ? ? Assessment/Plan:  ? ?Severe sepsis ?-Due to streptococcal multifocal pneumonia/bacteremia ?-Patient presented with sepsis criteria including tachypnea, tachycardia, fever ?-CT chest confirmed multifocal pneumonia ?-Also found to have polyarticular septic arthritis, right shoulder septic arthritis with right shoulder septic joint, septic sternoclavicular joint with multiple areas of bio myositis on imaging of  MRI ?-Patient empirically started on IV vancomycin, ceftriaxone and Zithromax ?-BC ID and urine was positive for Streptococcus ?-Antibiotics narrowed to IV ceftriaxone and then changed to penicillin G on 4/10 plan for 6 weeks of antibiotics till 01/03/2022 ?-ID has seen the patient and recommends 6 weeks of penicillin for endovascular coverage and outpatient TEE ?-I called cardiology and they will call patient to set up outpatient TEE ?-ID will follow in 3 weeks ? ?Presumed left sternoclavicular joint septic arthritis ?-CTA chest was concerning for septic arthritis ?-ED provider spoke to CT surgeon on-call, who felt that case and imaging were consistent with septic arthritis of left sternoclavicular joint and recommended medical management and IV antibiotics without need for surgical intervention ?-Per ultrasound, there was minimal fluid in the left [place] joint ?-Started on antibiotics as above ?-Today noted possible subluxation of the sternoclavicular joint; called and discussed with ID ?-We will obtain CT chest with contrast ? ?Septic arthritis of right shoulder ?-MRI right shoulder was ordered as per ID recommendation ?-Which showed septic arthritis involving AC joint and glenohumeral joint-severe myositis and pyomyositis involving subscapularis muscle, anterior deltoid muscle and distal aspect of pectoralis muscle ?-There is also myositis involving the infraspinatus muscle ?-Orthopedics consulted, spoke with Dr. Magnus IvanBlackman; ?-He saw patient and could not aspirate any fluid from the right shoulder  ?-No surgical intervention recommended ? ?Left ankle swelling/left knee swelling ?-MRI of left ankle was unremarkable for joint involvement only showed soft tissue inflammation; consistent with cellulitis ?-Left knee is swollen and warm to touch;  ?-Joint aspiration was attempted however no fluid obtained ?-Not consistent with septic arthritis ? ?Community-acquired pneumonia ?-Started on antibiotics as above ? ?DVT ?-Right  upper extremity superficial vein thrombosis involving cephalic vein ?-Left lower extremity age indeterminate DVT left popliteal vein acute DVT left gastrocnemius  and left posterior tibial veins ?-Patient was briefly on IV heparin, transition to Eliquis ?-Due to complaint of bilateral leg pain and swelling, obtained RLE venous Dopplers which were negative for DVT or SVT ?-I have consulted oncology for further evaluation for this patient with multiple DVTs, hypoalbuminemia, transaminitis ?-We will check CT abdomen/pelvis for underlying metastatic process ? ?Transaminitis ?-Likely in setting of severe sepsis ?-No GI symptoms ?-Hepatitis panel negative ?-Right upper quadrant ultrasound showed gallbladder sludge with no acute findings ?-LFTs have been elevated; unclear etiology ?-GI was consulted, at this time it was felt that LFTs are elevated due to sepsis ? ?Anasarca ?-Patient has swelling of right upper extremity and both lower extremities ?-Albumin is down to 1.9 ?-Dietitian consulted for hypoalbuminemia ?-Started on Nuromax p.o. twice daily, MVI with minerals ? ?Thrombocytopenia ?-Suspect chronic or acute on chronic ?-Platelet count in 2018 was 104 ?-Oncology was consulted.  Dr. Parke Poisson will follow patient in 3 months in clinic ? ?Diabetes mellitus type 2 ?-Oral hypoglycemics on hold ?-CBG has been elevated ?-Increase Semglee to 20 units subcu daily, NovoLog meal coverage to 6 units 3 times daily ?-Change sliding scale to resistant ? ?Hypokalemia ?-Replete ? ?Hyponatremia ?-Mild ?-Sodium is 131 ?-Follow BMP in am ? ?Hypophosphatemia ?-Replaced ? ? ? ? ? ?Medications ? ?  ? apixaban  10 mg Oral BID  ? Followed by  ? [START ON 12/04/2021] apixaban  5 mg Oral BID  ? Chlorhexidine Gluconate Cloth  6 each Topical Daily  ? insulin aspart  0-20 Units Subcutaneous TID WC  ? insulin aspart  0-5 Units Subcutaneous QHS  ? insulin aspart  6 Units Subcutaneous TID WC  ? insulin glargine-yfgn  20 Units Subcutaneous Daily  ?  lidocaine  1 patch Transdermal Q24H  ? multivitamin with minerals  1 tablet Oral Daily  ? polyethylene glycol  17 g Oral BID  ? Ensure Max Protein  11 oz Oral BID  ? senna-docusate  2 tablet Oral Daily  ? simethicone  80 mg Oral QID  ? ? ? Data Reviewed:  ? ?CBG: ? ?Recent Labs  ?Lab 12/02/21 ?0739 12/02/21 ?1122 12/02/21 ?2022 12/03/21 ?8366 12/03/21 ?1116  ?GLUCAP 201* 310* 162* 195* 226*  ? ? ?SpO2: 96 %  ? ? ?Vitals:  ? 12/02/21 2019 12/03/21 0447 12/03/21 0448 12/03/21 0522  ?BP: (!) 159/87 (!) 175/98  (!) 162/88  ?Pulse: (!) 103 99  97  ?Resp: 20 16    ?Temp: 98.4 ?F (36.9 ?C) 98.4 ?F (36.9 ?C)    ?TempSrc: Oral Oral    ?SpO2: 98% 96%    ?Weight:   92.7 kg   ?Height:      ? ? ? ? ?Data Reviewed: ? ?Basic Metabolic Panel: ?Recent Labs  ?Lab 11/27/21 ?0240 11/27/21 ?2947 11/28/21 ?0320 11/29/21 ?6546 12/01/21 ?1040 12/02/21 ?0423  ?NA 133*  --  133* 132* 128* 131*  ?K 4.1  --  4.1 4.6 3.4* 3.6  ?CL 98  --  99 97* 93* 94*  ?CO2 27  --  27 28 26 28   ?GLUCOSE 212*  --  255* 247* 171* 195*  ?BUN 18  --  13 12 12 11   ?CREATININE 0.59*  --  0.63 0.79 0.65 0.73  ?CALCIUM 7.8*  --  7.7* 8.0* 8.2* 8.0*  ?PHOS  --  3.4  --   --   --   --   ? ? ?CBC: ?Recent Labs  ?Lab 11/27/21 ?0240 11/28/21 ?0320 11/29/21 ?01/28/22 12/02/21 ?1040  ?WBC  12.0* 12.3* 13.1* 10.5  ?HGB 14.9 12.8* 13.1 12.3*  ?HCT 41.8 36.2* 39.1 36.4*  ?MCV 84.3 84.4 87.9 88.6  ?PLT 155 191 234 358  ? ? ?LFT ?Recent Labs  ?Lab 11/27/21 ?0240 11/28/21 ?0320 11/29/21 ?0865 12/01/21 ?1040 12/02/21 ?1040  ?AST 125* 83* 56* 56* 52*  ?ALT 84* 68* 57* 52* 48*  ?ALKPHOS 349* 334* 315* 297* 264*  ?BILITOT 1.4* 1.5* 1.2 1.3* 1.7*  ?PROT 5.7* 5.4* 5.7* 6.3* 6.2*  ?ALBUMIN 2.0* 1.8* 1.9* 2.0* 1.9*  ? ?  ?Antibiotics: ?Anti-infectives (From admission, onward)  ? ? Start     Dose/Rate Route Frequency Ordered Stop  ? 12/01/21 0000  penicillin G IVPB       ? 24 Million Units Intravenous Every 24 hours 12/01/21 1303 01/03/22 2359  ? 11/27/21 1000  penicillin G potassium 12  Million Units in dextrose 5 % 500 mL continuous infusion       ? 12 Million Units ?41.7 mL/hr over 12 Hours Intravenous Every 12 hours 11/27/21 0901    ? 11/23/21 2000  vancomycin (VANCOREADY) IVPB 1750 mg/350 mL  Sta

## 2021-12-03 NOTE — Progress Notes (Signed)
Staff RN advised to D/C Piv nsl x2 in Left arm with PICC ?

## 2021-12-03 NOTE — H&P (View-Only) (Signed)
Reason for Consult:sternoclavicular joint ascess ?Referring Physician: Dr. Sharl Ma ? ?David Strong is an 66 y.o. male.  ?HPI: 66 yo man admitted to hospital 10 days ago with septic arthritis of right shoulder. CT at that time showed probable infection of left sternoclavicular joint. Treated with IV azithromycin, vancomycin and ceftriaxone initially. Blood cultures grew strep pneumonia. Changed to single agent ceftriaxone then penicillin G. Over past couple of days he has noted popping at the left sternoclavicular joint and progressive swelling. Today a CT chest shows subluxation of [place] joint with surrounding abscess.  ? ?He was diagnosed with a DVT earlier in the hospitalization and is on Eliquis. ? ?He is able to lift his left arm. ? ?Past Medical History:  ?Diagnosis Date  ? Diabetes mellitus without complication (HCC)   ? ? ?History reviewed. No pertinent surgical history. ? ?Family History  ?Adopted: Yes  ? ? ?Social History:  reports that he has quit smoking. He has never used smokeless tobacco. He reports current alcohol use. He reports that he does not use drugs. ? ?Allergies:  ?Allergies  ?Allergen Reactions  ? Codeine Itching  ? Glipizide Er [Glipizide]   ?  SOB, malaise. He tolerates XL without issues.  ? Metformin And Related Rash  ? ? ?Medications: Scheduled: ? Chlorhexidine Gluconate Cloth  6 each Topical Daily  ? insulin aspart  0-20 Units Subcutaneous TID WC  ? insulin aspart  0-5 Units Subcutaneous QHS  ? insulin aspart  6 Units Subcutaneous TID WC  ? insulin glargine-yfgn  20 Units Subcutaneous Daily  ? lidocaine  1 patch Transdermal Q24H  ? multivitamin with minerals  1 tablet Oral Daily  ? polyethylene glycol  17 g Oral BID  ? Ensure Max Protein  11 oz Oral BID  ? senna-docusate  2 tablet Oral Daily  ? simethicone  80 mg Oral QID  ? sodium chloride (PF)      ? ? ?Results for orders placed or performed during the hospital encounter of 11/22/21 (from the past 48 hour(s))  ?Glucose, capillary     Status:  Abnormal  ? Collection Time: 12/01/21  8:14 PM  ?Result Value Ref Range  ? Glucose-Capillary 175 (H) 70 - 99 mg/dL  ?  Comment: Glucose reference range applies only to samples taken after fasting for at least 8 hours.  ?Basic metabolic panel     Status: Abnormal  ? Collection Time: 12/02/21  4:23 AM  ?Result Value Ref Range  ? Sodium 131 (L) 135 - 145 mmol/L  ? Potassium 3.6 3.5 - 5.1 mmol/L  ? Chloride 94 (L) 98 - 111 mmol/L  ? CO2 28 22 - 32 mmol/L  ? Glucose, Bld 195 (H) 70 - 99 mg/dL  ?  Comment: Glucose reference range applies only to samples taken after fasting for at least 8 hours.  ? BUN 11 8 - 23 mg/dL  ? Creatinine, Ser 0.73 0.61 - 1.24 mg/dL  ? Calcium 8.0 (L) 8.9 - 10.3 mg/dL  ? GFR, Estimated >60 >60 mL/min  ?  Comment: (NOTE) ?Calculated using the CKD-EPI Creatinine Equation (2021) ?  ? Anion gap 9 5 - 15  ?  Comment: Performed at [facility], [address] [place], [place] [address]  ?Glucose, capillary     Status: Abnormal  ? Collection Time: 12/02/21  7:39 AM  ?Result Value Ref Range  ? Glucose-Capillary 201 (H) 70 - 99 mg/dL  ?  Comment: Glucose reference range applies only to samples taken after  fasting for at least 8 hours.  ?CBC     Status: Abnormal  ? Collection Time: 12/02/21 10:40 AM  ?Result Value Ref Range  ? WBC 10.5 4.0 - 10.5 K/uL  ? RBC 4.11 (L) 4.22 - 5.81 MIL/uL  ? Hemoglobin 12.3 (L) 13.0 - 17.0 g/dL  ? HCT 36.4 (L) 39.0 - 52.0 %  ? MCV 88.6 80.0 - 100.0 fL  ? MCH 29.9 26.0 - 34.0 pg  ? MCHC 33.8 30.0 - 36.0 g/dL  ? RDW 13.9 11.5 - 15.5 %  ? Platelets 358 150 - 400 K/uL  ? nRBC 0.0 0.0 - 0.2 %  ?  Comment: Performed at [facility], [address] [place], [place] [address]  ?Hepatic function panel     Status: Abnormal  ? Collection Time: 12/02/21 10:40 AM  ?Result Value Ref Range  ? Total Protein 6.2 (L) 6.5 - 8.1 g/dL  ? Albumin 1.9 (L) 3.5 - 5.0 g/dL  ? AST 52 (H) 15 - 41 U/L  ? ALT 48 (H) 0 - 44 U/L  ? Alkaline Phosphatase 264 (H) 38 -  126 U/L  ? Total Bilirubin 1.7 (H) 0.3 - 1.2 mg/dL  ? Bilirubin, Direct 0.5 (H) 0.0 - 0.2 mg/dL  ? Indirect Bilirubin 1.2 (H) 0.3 - 0.9 mg/dL  ?  Comment: Performed at [facility], [address] [place], [place] [address]  ?Glucose, capillary     Status: Abnormal  ? Collection Time: 12/02/21 11:22 AM  ?Result Value Ref Range  ? Glucose-Capillary 310 (H) 70 - 99 mg/dL  ?  Comment: Glucose reference range applies only to samples taken after fasting for at least 8 hours.  ?Glucose, capillary     Status: Abnormal  ? Collection Time: 12/02/21  8:22 PM  ?Result Value Ref Range  ? Glucose-Capillary 162 (H) 70 - 99 mg/dL  ?  Comment: Glucose reference range applies only to samples taken after fasting for at least 8 hours.  ?Glucose, capillary     Status: Abnormal  ? Collection Time: 12/03/21  7:42 AM  ?Result Value Ref Range  ? Glucose-Capillary 195 (H) 70 - 99 mg/dL  ?  Comment: Glucose reference range applies only to samples taken after fasting for at least 8 hours.  ?Glucose, capillary     Status: Abnormal  ? Collection Time: 12/03/21 11:16 AM  ?Result Value Ref Range  ? Glucose-Capillary 226 (H) 70 - 99 mg/dL  ?  Comment: Glucose reference range applies only to samples taken after fasting for at least 8 hours.  ? ? ?CT CHEST W CONTRAST ? ?Result Date: 12/03/2021 ?CLINICAL DATA:  Left-sided chest pain EXAM: CT CHEST WITH CONTRAST TECHNIQUE: Multidetector CT imaging of the chest was performed during intravenous contrast administration. RADIATION DOSE REDUCTION: This exam was performed according to the departmental dose-optimization program which includes automated exposure control, adjustment of the mA and/or kV according to patient size and/or use of iterative reconstruction technique. CONTRAST:  75mL OMNIPAQUE IOHEXOL 300 MG/ML  SOLN COMPARISON:  CT chest 11/22/2021 FINDINGS: Cardiovascular: Heart size is normal. Calcifications in the LAD coronary artery noted. No pericardial effusion.  Mediastinum/Nodes: No enlarged mediastinal, hilar, or axillary lymph nodes. Thyroid gland, trachea, and esophagus demonstrate no significant findings. Lungs/Pleura: No pleural effusion. Mild subsegmental atelectasis in the lung bases. Patchy area of ground-glass attenuation noted in the central right middle lobe compatible with nonspecific pneumonitis. No airspace consolidation Upper Abdomen: No acute abnormality. Musculoskeletal: There has been progressive anterior   subluxation of the left sternoclavicular joint. New erosive changes with bony fragmentation noted along the medial articular surface of the proximal left clavicle. Gas is now seen within the proximal aspect of the left clavicle, image 33/2. Progressive gas and fluid collection is noted centered around the left supraclavicular joint which now extends into the left side of neck. This measures 7.9 x 3.1 by 4.7 cm, image 43/5 and image 15/2. This has mass effect upon the left common carotid artery and left internal jugular vein, image 9/2. There is also a fluid collection which tracks along the posterior aspect of the left clavicle measuring approximately 5.5 by 2.1 by 1.9 cm. Involvement of the left pectoralis major muscle which is asymmetrically enlarged and contains foci of gas and fluid, image 43/2. Fluid also extends across the midline anterior to the thyroid gland into the right-side of neck, image 21/2. IMPRESSION: 1. Progressive anterior subluxation of the left sternoclavicular joint with new erosive changes with bony fragmentation along the medial articular surface of the proximal left clavicle. Findings are worrisome for septic arthritis. 2. Progressive gas and fluid collection centered around the left supraclavicular joint which now extends into the left side of neck. There is also involvement of the left pectoralis major muscle which is asymmetrically enlarged and contains foci of gas and fluid. Fluid also is seen extending across the midline  anterior to the thyroid gland into the right lower neck. Findings compatible with abscess formation. 3. LAD coronary artery calcifications. 4. Patchy area of ground-glass attenuation noted in the central right middle

## 2021-12-03 NOTE — Progress Notes (Signed)
?   ? ? ? ? ?[facility] Infectious Disease ? ?Date of Admission:  11/22/2021   Total days of inpatient antibiotics 9 ?Principal Problem: ?  Severe sepsis ([place]) ?Active Problems: ?  Thrombocytopenia ([place]) ?  Type 2 diabetes mellitus ([place]) ?  CAP (community acquired pneumonia) ?  High anion gap metabolic acidosis ?  Septic arthritis ([place]) ?  Transaminitis ?  Pneumococcal bacteremia ?  Hypokalemia ?  Hypophosphatemia ?  DVT (deep venous thrombosis) ([place]) ?  Constipation ?  Septic arthritis of left sternoclavicular joint ([place]) ?  Bilateral leg edema ?     ?    ?Assessment: ?52 YM with strep pneumo bacteremia and multiple  septic joints and pyomyositis. ? 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 ? ?-Seen by Ortho on 4/13 and multiple areas examined. . No fluid was able to be aspirated around subacromial outlet, shoulder, glenohumeral joint. No fluid sound on aspiration of right shoulder and left knee on 4/12. Felt to fit myositis picture. No current plans for operative intervention.  ?-Strep pneumo is an uncommon cause for endocarditis. TTE did not show vegetation. Given pt's multiple foci of infection/thrombus it is suspicious for septic emboli as such plan to get TEE.  ? ?-Initially pt planned to go home. Noted to have increased chest swelling. CT chest showed findings concerning for abscess extending from left supraclavicular join to neck, left pectoralis major involved. Ground glass attenuation RML. Bony fragmentation along proximal left clavicle.  ?-CTS engaged,  plane on I&D at [facility] ? ?Recommendations: ?-Continue Penicillin (Anticipate 6 weeks from I&D) ?-Ordered TEE(I discussed I was concerned about endovascular infection with pt, wife and son at bedside).  ?-Follow-up I&D CX with CTS(plan on Monday) ? ?Microbiology:   ?Antibiotics: ?Azithormycin 4/5 ?Cefepime 4/5 ?Ceftriaxone 4/5-4/9 ?Penicillin 4/10-p ?Cultures: ?Blood ?4/5 2/2 strep pneumo ?4/6 NG ?4/7 NG ? ? ? ?SUBJECTIVE: ?Resting in bed. Family at bedside.  ? ?Review of Systems: ?Review of Systems  ?All other systems reviewed and are negative. ? ? ?Scheduled Meds: ? Chlorhexidine Gluconate Cloth  6 each Topical Daily  ? insulin aspart  0-20 Units Subcutaneous TID WC  ? insulin aspart  0-5 Units Subcutaneous QHS  ? insulin aspart  6 Units Subcutaneous TID WC  ? insulin glargine-yfgn  20 Units Subcutaneous Daily  ? lidocaine  1 patch Transdermal Q24H  ? multivitamin with minerals  1 tablet Oral Daily  ? polyethylene glycol  17 g Oral BID  ? Ensure Max Protein  11 oz Oral BID  ? senna-docusate  2 tablet Oral Daily  ? simethicone  80 mg Oral QID  ? sodium chloride (PF)      ? ?Continuous Infusions: ? penicillin g continuous IV infusion 12 Million Units (12/03/21 1022)  ? ?PRN Meds:.acetaminophen **OR** [DISCONTINUED] acetaminophen, alum & mag hydroxide-simeth, benzonatate, bisacodyl, hydrALAZINE, naLOXone (NARCAN)  injection, oxyCODONE, sodium chloride flush ?Allergies  ?Allergen Reactions  ? Codeine Itching  ? Glipizide Er [Glipizide]   ?  SOB, malaise. He tolerates XL without issues.  ? Metformin And Related Rash  ? ? ?OBJECTIVE: ?Vitals:  ? 12/03/21 0522 12/03/21 1500 12/03/21 1532 12/03/21 2037  ?BP: (!) 162/88 (!) 191/109 Marland Kitchen)  179/96 (!) 181/104  ?Pulse: 97  (!) 109 (!) 102  ?Resp:   20 20  ?Temp:   98.3 ?F (36.8 ?C) 98.5 ?F (36.9 ?C)  ?TempSrc:   Oral Oral  ?SpO2:   98% 99%  ?Weight:      ?Height:      ? ?Body mass index is 35.08 kg/m?. ? ?Physical Exam ?Constitutional:   ?   General: He is not in acute distress. ?    Appearance: He is normal weight. He is not toxic-appearing.  ?HENT:  ?   Head: Normocephalic and atraumatic.  ?   Right Ear: External ear normal.  ?   Left Ear: External ear normal.  ?   Nose: No congestion or rhinorrhea.  ?   Mouth/Throat:  ?   Mouth: Mucous membranes are moist.  ?   Pharynx: Oropharynx is clear.  ?Eyes:  ?   Extraocular Movements: Extraocular movements intact.  ?   Conjunctiva/sclera: Conjunctivae normal.  ?   Pupils: Pupils are equal, round, and reactive to light.  ?Cardiovascular:  ?   Rate and Rhythm: Normal rate and regular rhythm.  ?   Heart sounds: No murmur heard. ?  No friction rub. No gallop.  ?   Comments: Superior chest wall edematous and tender.  ?Pulmonary:  ?   Effort: Pulmonary effort is normal.  ?   Breath sounds: Normal breath sounds.  ?Abdominal:  ?   General: Abdomen is flat. Bowel sounds are normal.  ?   Palpations: Abdomen is soft.  ?Musculoskeletal:     ?   General: No swelling. Normal range of motion.  ?   Cervical back: Normal range of motion and neck supple.  ?Skin: ?   General: Skin is warm and dry.  ?Neurological:  ?   General: No focal deficit present.  ?   Mental Status: He is oriented to person, place, and time.  ?Psychiatric:     ?   Mood and Affect: Mood normal.  ? ? ? ? ?Lab Results ?Lab Results  ?Component Value Date  ? WBC 10.5 12/02/2021  ? HGB 12.3 (L) 12/02/2021  ? HCT 36.4 (L) 12/02/2021  ? MCV 88.6 12/02/2021  ? PLT 358 12/02/2021  ?  ?Lab Results  ?Component Value Date  ? CREATININE 0.73 12/02/2021  ? BUN 11 12/02/2021  ? NA 131 (L) 12/02/2021  ? K 3.6 12/02/2021  ? CL 94 (L) 12/02/2021  ? CO2 28 12/02/2021  ?  ?Lab Results  ?Component Value Date  ? ALT 48 (H) 12/02/2021  ? AST 52 (H) 12/02/2021  ? GGT 141 (H) 11/23/2021  ? ALKPHOS 264 (H) 12/02/2021  ? BILITOT 1.7 (H) 12/02/2021  ?  ? ? ? ? ?Laurice Record, MD ?[facility] ?[facility] ?12/03/2021, 9:09 PM  ?

## 2021-12-03 NOTE — Progress Notes (Signed)
PCT notified RN of elevated blood pressure. 191/109 after multiple attempts in different limbs.  ?Pt asymptomatic, denies dizziness, headache, or vision changes.  ?MD notified, orders received for PRN Hydralazine.  ? ? ?

## 2021-12-03 NOTE — Consult Note (Signed)
Reason for Consult:sternoclavicular joint ascess ?Referring Physician: Dr. Sharl Ma ? ?David Strong is an 66 y.o. male.  ?HPI: 66 yo man admitted to hospital 10 days ago with septic arthritis of right shoulder. CT at that time showed probable infection of left sternoclavicular joint. Treated with IV azithromycin, vancomycin and ceftriaxone initially. Blood cultures grew strep pneumonia. Changed to single agent ceftriaxone then penicillin G. Over past couple of days he has noted popping at the left sternoclavicular joint and progressive swelling. Today a CT chest shows subluxation of [place] joint with surrounding abscess.  ? ?He was diagnosed with a DVT earlier in the hospitalization and is on Eliquis. ? ?He is able to lift his left arm. ? ?Past Medical History:  ?Diagnosis Date  ? Diabetes mellitus without complication (HCC)   ? ? ?History reviewed. No pertinent surgical history. ? ?Family History  ?Adopted: Yes  ? ? ?Social History:  reports that he has quit smoking. He has never used smokeless tobacco. He reports current alcohol use. He reports that he does not use drugs. ? ?Allergies:  ?Allergies  ?Allergen Reactions  ? Codeine Itching  ? Glipizide Er [Glipizide]   ?  SOB, malaise. He tolerates XL without issues.  ? Metformin And Related Rash  ? ? ?Medications: Scheduled: ? Chlorhexidine Gluconate Cloth  6 each Topical Daily  ? insulin aspart  0-20 Units Subcutaneous TID WC  ? insulin aspart  0-5 Units Subcutaneous QHS  ? insulin aspart  6 Units Subcutaneous TID WC  ? insulin glargine-yfgn  20 Units Subcutaneous Daily  ? lidocaine  1 patch Transdermal Q24H  ? multivitamin with minerals  1 tablet Oral Daily  ? polyethylene glycol  17 g Oral BID  ? Ensure Max Protein  11 oz Oral BID  ? senna-docusate  2 tablet Oral Daily  ? simethicone  80 mg Oral QID  ? sodium chloride (PF)      ? ? ?Results for orders placed or performed during the hospital encounter of 11/22/21 (from the past 48 hour(s))  ?Glucose, capillary     Status:  Abnormal  ? Collection Time: 12/01/21  8:14 PM  ?Result Value Ref Range  ? Glucose-Capillary 175 (H) 70 - 99 mg/dL  ?  Comment: Glucose reference range applies only to samples taken after fasting for at least 8 hours.  ?Basic metabolic panel     Status: Abnormal  ? Collection Time: 12/02/21  4:23 AM  ?Result Value Ref Range  ? Sodium 131 (L) 135 - 145 mmol/L  ? Potassium 3.6 3.5 - 5.1 mmol/L  ? Chloride 94 (L) 98 - 111 mmol/L  ? CO2 28 22 - 32 mmol/L  ? Glucose, Bld 195 (H) 70 - 99 mg/dL  ?  Comment: Glucose reference range applies only to samples taken after fasting for at least 8 hours.  ? BUN 11 8 - 23 mg/dL  ? Creatinine, Ser 0.73 0.61 - 1.24 mg/dL  ? Calcium 8.0 (L) 8.9 - 10.3 mg/dL  ? GFR, Estimated >60 >60 mL/min  ?  Comment: (NOTE) ?Calculated using the CKD-EPI Creatinine Equation (2021) ?  ? Anion gap 9 5 - 15  ?  Comment: Performed at [facility], [address] [place], [place] [address]  ?Glucose, capillary     Status: Abnormal  ? Collection Time: 12/02/21  7:39 AM  ?Result Value Ref Range  ? Glucose-Capillary 201 (H) 70 - 99 mg/dL  ?  Comment: Glucose reference range applies only to samples taken after  fasting for at least 8 hours.  ?CBC     Status: Abnormal  ? Collection Time: 12/02/21 10:40 AM  ?Result Value Ref Range  ? WBC 10.5 4.0 - 10.5 K/uL  ? RBC 4.11 (L) 4.22 - 5.81 MIL/uL  ? Hemoglobin 12.3 (L) 13.0 - 17.0 g/dL  ? HCT 36.4 (L) 39.0 - 52.0 %  ? MCV 88.6 80.0 - 100.0 fL  ? MCH 29.9 26.0 - 34.0 pg  ? MCHC 33.8 30.0 - 36.0 g/dL  ? RDW 13.9 11.5 - 15.5 %  ? Platelets 358 150 - 400 K/uL  ? nRBC 0.0 0.0 - 0.2 %  ?  Comment: Performed at [facility], [address] [place], [place] [address]  ?Hepatic function panel     Status: Abnormal  ? Collection Time: 12/02/21 10:40 AM  ?Result Value Ref Range  ? Total Protein 6.2 (L) 6.5 - 8.1 g/dL  ? Albumin 1.9 (L) 3.5 - 5.0 g/dL  ? AST 52 (H) 15 - 41 U/L  ? ALT 48 (H) 0 - 44 U/L  ? Alkaline Phosphatase 264 (H) 38 -  126 U/L  ? Total Bilirubin 1.7 (H) 0.3 - 1.2 mg/dL  ? Bilirubin, Direct 0.5 (H) 0.0 - 0.2 mg/dL  ? Indirect Bilirubin 1.2 (H) 0.3 - 0.9 mg/dL  ?  Comment: Performed at [facility], [address] [place], [place] [address]  ?Glucose, capillary     Status: Abnormal  ? Collection Time: 12/02/21 11:22 AM  ?Result Value Ref Range  ? Glucose-Capillary 310 (H) 70 - 99 mg/dL  ?  Comment: Glucose reference range applies only to samples taken after fasting for at least 8 hours.  ?Glucose, capillary     Status: Abnormal  ? Collection Time: 12/02/21  8:22 PM  ?Result Value Ref Range  ? Glucose-Capillary 162 (H) 70 - 99 mg/dL  ?  Comment: Glucose reference range applies only to samples taken after fasting for at least 8 hours.  ?Glucose, capillary     Status: Abnormal  ? Collection Time: 12/03/21  7:42 AM  ?Result Value Ref Range  ? Glucose-Capillary 195 (H) 70 - 99 mg/dL  ?  Comment: Glucose reference range applies only to samples taken after fasting for at least 8 hours.  ?Glucose, capillary     Status: Abnormal  ? Collection Time: 12/03/21 11:16 AM  ?Result Value Ref Range  ? Glucose-Capillary 226 (H) 70 - 99 mg/dL  ?  Comment: Glucose reference range applies only to samples taken after fasting for at least 8 hours.  ? ? ?CT CHEST W CONTRAST ? ?Result Date: 12/03/2021 ?CLINICAL DATA:  Left-sided chest pain EXAM: CT CHEST WITH CONTRAST TECHNIQUE: Multidetector CT imaging of the chest was performed during intravenous contrast administration. RADIATION DOSE REDUCTION: This exam was performed according to the departmental dose-optimization program which includes automated exposure control, adjustment of the mA and/or kV according to patient size and/or use of iterative reconstruction technique. CONTRAST:  75mL OMNIPAQUE IOHEXOL 300 MG/ML  SOLN COMPARISON:  CT chest 11/22/2021 FINDINGS: Cardiovascular: Heart size is normal. Calcifications in the LAD coronary artery noted. No pericardial effusion.  Mediastinum/Nodes: No enlarged mediastinal, hilar, or axillary lymph nodes. Thyroid gland, trachea, and esophagus demonstrate no significant findings. Lungs/Pleura: No pleural effusion. Mild subsegmental atelectasis in the lung bases. Patchy area of ground-glass attenuation noted in the central right middle lobe compatible with nonspecific pneumonitis. No airspace consolidation Upper Abdomen: No acute abnormality. Musculoskeletal: There has been progressive anterior  subluxation of the left sternoclavicular joint. New erosive changes with bony fragmentation noted along the medial articular surface of the proximal left clavicle. Gas is now seen within the proximal aspect of the left clavicle, image 33/2. Progressive gas and fluid collection is noted centered around the left supraclavicular joint which now extends into the left side of neck. This measures 7.9 x 3.1 by 4.7 cm, image 43/5 and image 15/2. This has mass effect upon the left common carotid artery and left internal jugular vein, image 9/2. There is also a fluid collection which tracks along the posterior aspect of the left clavicle measuring approximately 5.5 by 2.1 by 1.9 cm. Involvement of the left pectoralis major muscle which is asymmetrically enlarged and contains foci of gas and fluid, image 43/2. Fluid also extends across the midline anterior to the thyroid gland into the right-side of neck, image 21/2. IMPRESSION: 1. Progressive anterior subluxation of the left sternoclavicular joint with new erosive changes with bony fragmentation along the medial articular surface of the proximal left clavicle. Findings are worrisome for septic arthritis. 2. Progressive gas and fluid collection centered around the left supraclavicular joint which now extends into the left side of neck. There is also involvement of the left pectoralis major muscle which is asymmetrically enlarged and contains foci of gas and fluid. Fluid also is seen extending across the midline  anterior to the thyroid gland into the right lower neck. Findings compatible with abscess formation. 3. LAD coronary artery calcifications. 4. Patchy area of ground-glass attenuation noted in the central right middle

## 2021-12-03 NOTE — Progress Notes (Addendum)
Got a call from radiologist regarding CT chest with contrast.  CT chest contrast shows subluxation of left of left sternoclavicular joint with abscess surrounding the joint extending into the neck. ?Called CT surgeon on-call, Dr. Dorris Fetch, Dr. Dorris Fetch. Discussed the results of CT scan. He will see patient today. ?

## 2021-12-04 ENCOUNTER — Encounter (HOSPITAL_COMMUNITY)
Admission: EM | Disposition: A | Discharge status: Home Health | Payer: Self-pay | Source: Ambulatory Visit | Attending: Internal Medicine

## 2021-12-04 ENCOUNTER — Other Ambulatory Visit: Payer: Self-pay

## 2021-12-04 ENCOUNTER — Encounter (HOSPITAL_COMMUNITY): Payer: Self-pay | Admitting: Internal Medicine

## 2021-12-04 ENCOUNTER — Inpatient Hospital Stay (HOSPITAL_COMMUNITY): Payer: Medicare Other | Admitting: Certified Registered"

## 2021-12-04 DIAGNOSIS — M86112 Other acute osteomyelitis, left shoulder: Secondary | ICD-10-CM | POA: Diagnosis not present

## 2021-12-04 DIAGNOSIS — E1100 Type 2 diabetes mellitus with hyperosmolarity without nonketotic hyperglycemic-hyperosmolar coma (NKHHC): Secondary | ICD-10-CM

## 2021-12-04 DIAGNOSIS — D649 Anemia, unspecified: Secondary | ICD-10-CM

## 2021-12-04 DIAGNOSIS — I82402 Acute embolism and thrombosis of unspecified deep veins of left lower extremity: Secondary | ICD-10-CM | POA: Diagnosis not present

## 2021-12-04 DIAGNOSIS — Z7984 Long term (current) use of oral hypoglycemic drugs: Secondary | ICD-10-CM

## 2021-12-04 DIAGNOSIS — J189 Pneumonia, unspecified organism: Secondary | ICD-10-CM | POA: Diagnosis not present

## 2021-12-04 DIAGNOSIS — R600 Localized edema: Secondary | ICD-10-CM | POA: Diagnosis not present

## 2021-12-04 DIAGNOSIS — M009 Pyogenic arthritis, unspecified: Secondary | ICD-10-CM

## 2021-12-04 DIAGNOSIS — E119 Type 2 diabetes mellitus without complications: Secondary | ICD-10-CM

## 2021-12-04 DIAGNOSIS — A419 Sepsis, unspecified organism: Secondary | ICD-10-CM | POA: Diagnosis not present

## 2021-12-04 HISTORY — PX: INCISION AND DRAINAGE OF WOUND: SHX1803

## 2021-12-04 LAB — GLUCOSE, CAPILLARY
Glucose-Capillary: 158 mg/dL — ABNORMAL HIGH (ref 70–99)
Glucose-Capillary: 184 mg/dL — ABNORMAL HIGH (ref 70–99)
Glucose-Capillary: 191 mg/dL — ABNORMAL HIGH (ref 70–99)
Glucose-Capillary: 131 mg/dL — ABNORMAL HIGH (ref 70–99)
Glucose-Capillary: 169 mg/dL — ABNORMAL HIGH (ref 70–99)
Glucose-Capillary: 117 mg/dL — ABNORMAL HIGH (ref 70–99)
Glucose-Capillary: 134 mg/dL — ABNORMAL HIGH (ref 70–99)
Glucose-Capillary: 317 mg/dL — ABNORMAL HIGH (ref 70–99)

## 2021-12-04 SURGERY — IRRIGATION AND DEBRIDEMENT WOUND
Anesthesia: General | Site: Chest | Laterality: Left

## 2021-12-04 MED ORDER — OXYCODONE HCL 5 MG/5ML PO SOLN: 5.0000 mg | Freq: Once | ORAL | Status: DC | PRN

## 2021-12-04 MED ORDER — PROPOFOL 10 MG/ML IV BOLUS
INTRAVENOUS | Status: AC
  Filled 2021-12-04: qty 20

## 2021-12-04 MED ORDER — FENTANYL CITRATE PF 50 MCG/ML IJ SOSY
25.0000 ug | PREFILLED_SYRINGE | INTRAMUSCULAR | Status: DC | PRN
  Administered 2021-12-11: 50 ug via INTRAVENOUS
  Filled 2021-12-04 (×2): qty 1

## 2021-12-04 MED ORDER — SUGAMMADEX SODIUM 200 MG/2ML IV SOLN
INTRAVENOUS | Status: DC | PRN
  Administered 2021-12-04: 200 mg via INTRAVENOUS

## 2021-12-04 MED ORDER — BISACODYL 5 MG PO TBEC
10.0000 mg | DELAYED_RELEASE_TABLET | Freq: Every day | ORAL | Status: DC
  Administered 2021-12-08 – 2021-12-18 (×7): 10 mg via ORAL
  Filled 2021-12-04 (×13): qty 2

## 2021-12-04 MED ORDER — ONDANSETRON HCL 4 MG/2ML IJ SOLN: 4.0000 mg | Freq: Four times a day (QID) | INTRAMUSCULAR | Status: DC | PRN

## 2021-12-04 MED ORDER — ACETAMINOPHEN 160 MG/5ML PO SOLN: 1000.0000 mg | Freq: Four times a day (QID) | ORAL | Status: AC

## 2021-12-04 MED ORDER — POTASSIUM CHLORIDE IN NACL 20-0.45 MEQ/L-% IV SOLN
INTRAVENOUS | Status: DC
  Filled 2021-12-04 (×2): qty 1000

## 2021-12-04 MED ORDER — SENNOSIDES-DOCUSATE SODIUM 8.6-50 MG PO TABS
1.0000 | ORAL_TABLET | Freq: Every day | ORAL | Status: DC
  Administered 2021-12-04 – 2021-12-17 (×7): 1 via ORAL
  Filled 2021-12-04 (×10): qty 1

## 2021-12-04 MED ORDER — VANCOMYCIN HCL IN DEXTROSE 1-5 GM/200ML-% IV SOLN
1000.0000 mg | INTRAVENOUS | Status: AC
  Administered 2021-12-04: 1000 mg via INTRAVENOUS
  Filled 2021-12-04: qty 200

## 2021-12-04 MED ORDER — FENTANYL CITRATE (PF) 250 MCG/5ML IJ SOLN
INTRAMUSCULAR | Status: AC
  Filled 2021-12-04: qty 5

## 2021-12-04 MED ORDER — OXYCODONE HCL 5 MG PO TABS: 5.0000 mg | ORAL_TABLET | Freq: Once | ORAL | Status: DC | PRN

## 2021-12-04 MED ORDER — ORAL CARE MOUTH RINSE: 15.0000 mL | Freq: Once | OROMUCOSAL | Status: AC

## 2021-12-04 MED ORDER — ONDANSETRON HCL 4 MG/2ML IJ SOLN
INTRAMUSCULAR | Status: DC | PRN
  Administered 2021-12-04: 4 mg via INTRAVENOUS

## 2021-12-04 MED ORDER — PROPOFOL 10 MG/ML IV BOLUS
INTRAVENOUS | Status: DC | PRN
  Administered 2021-12-04: 200 mg via INTRAVENOUS

## 2021-12-04 MED ORDER — FENTANYL CITRATE (PF) 100 MCG/2ML IJ SOLN
25.0000 ug | INTRAMUSCULAR | Status: DC | PRN
  Administered 2021-12-04 (×2): 25 ug via INTRAVENOUS

## 2021-12-04 MED ORDER — FENTANYL CITRATE (PF) 100 MCG/2ML IJ SOLN
INTRAMUSCULAR | Status: AC
  Filled 2021-12-04: qty 2

## 2021-12-04 MED ORDER — INSULIN ASPART 100 UNIT/ML IJ SOLN
0.0000 [IU] | Freq: Every day | INTRAMUSCULAR | Status: DC
  Administered 2021-12-04: 4 [IU] via SUBCUTANEOUS

## 2021-12-04 MED ORDER — OXYCODONE HCL 5 MG PO TABS
5.0000 mg | ORAL_TABLET | ORAL | Status: DC | PRN
  Administered 2021-12-06 – 2021-12-13 (×9): 10 mg via ORAL
  Administered 2021-12-13: 5 mg via ORAL
  Administered 2021-12-14 – 2021-12-16 (×3): 10 mg via ORAL
  Administered 2021-12-16: 5 mg via ORAL
  Administered 2021-12-16 – 2021-12-18 (×3): 10 mg via ORAL
  Filled 2021-12-04 (×14): qty 2

## 2021-12-04 MED ORDER — PENICILLIN G POTASSIUM 20000000 UNITS IJ SOLR: 12.0000 10*6.[IU] | Freq: Two times a day (BID) | INTRAVENOUS | Status: DC

## 2021-12-04 MED ORDER — LACTATED RINGERS IV SOLN: INTRAVENOUS | Status: DC

## 2021-12-04 MED ORDER — INSULIN ASPART 100 UNIT/ML IJ SOLN: 0.0000 [IU] | INTRAMUSCULAR | Status: DC | PRN

## 2021-12-04 MED ORDER — CHLORHEXIDINE GLUCONATE 0.12 % MT SOLN
OROMUCOSAL | Status: AC
  Administered 2021-12-04: 15 mL via OROMUCOSAL
  Filled 2021-12-04: qty 15

## 2021-12-04 MED ORDER — CHLORHEXIDINE GLUCONATE 0.12 % MT SOLN: 15.0000 mL | Freq: Once | OROMUCOSAL | Status: AC

## 2021-12-04 MED ORDER — SODIUM CHLORIDE 0.9 % IR SOLN
Status: DC | PRN
  Administered 2021-12-04: 3000 mL

## 2021-12-04 MED ORDER — INSULIN ASPART 100 UNIT/ML IJ SOLN
0.0000 [IU] | Freq: Three times a day (TID) | INTRAMUSCULAR | Status: DC
  Administered 2021-12-05: 8 [IU] via SUBCUTANEOUS
  Administered 2021-12-05: 3 [IU] via SUBCUTANEOUS
  Administered 2021-12-05 – 2021-12-06 (×3): 5 [IU] via SUBCUTANEOUS
  Administered 2021-12-07 – 2021-12-08 (×2): 2 [IU] via SUBCUTANEOUS
  Administered 2021-12-08 – 2021-12-09 (×3): 3 [IU] via SUBCUTANEOUS
  Administered 2021-12-10: 8 [IU] via SUBCUTANEOUS
  Administered 2021-12-10: 5 [IU] via SUBCUTANEOUS
  Administered 2021-12-11 (×2): 3 [IU] via SUBCUTANEOUS
  Administered 2021-12-12: 5 [IU] via SUBCUTANEOUS
  Administered 2021-12-12: 3 [IU] via SUBCUTANEOUS
  Administered 2021-12-13 – 2021-12-14 (×5): 2 [IU] via SUBCUTANEOUS
  Administered 2021-12-14: 8 [IU] via SUBCUTANEOUS
  Administered 2021-12-15: 5 [IU] via SUBCUTANEOUS
  Administered 2021-12-15: 8 [IU] via SUBCUTANEOUS
  Administered 2021-12-16: 5 [IU] via SUBCUTANEOUS
  Administered 2021-12-16 – 2021-12-17 (×2): 3 [IU] via SUBCUTANEOUS
  Administered 2021-12-17: 8 [IU] via SUBCUTANEOUS
  Administered 2021-12-17: 3 [IU] via SUBCUTANEOUS
  Administered 2021-12-18: 5 [IU] via SUBCUTANEOUS

## 2021-12-04 MED ORDER — ROCURONIUM BROMIDE 10 MG/ML (PF) SYRINGE
PREFILLED_SYRINGE | INTRAVENOUS | Status: DC | PRN
  Administered 2021-12-04: 50 mg via INTRAVENOUS
  Administered 2021-12-04: 10 mg via INTRAVENOUS

## 2021-12-04 MED ORDER — 0.9 % SODIUM CHLORIDE (POUR BTL) OPTIME
TOPICAL | Status: DC | PRN
  Administered 2021-12-04: 1000 mL

## 2021-12-04 MED ORDER — ONDANSETRON HCL 4 MG/2ML IJ SOLN: 4.0000 mg | Freq: Once | INTRAMUSCULAR | Status: DC | PRN

## 2021-12-04 MED ORDER — ACETAMINOPHEN 500 MG PO TABS
1000.0000 mg | ORAL_TABLET | Freq: Once | ORAL | Status: AC
  Administered 2021-12-04: 1000 mg via ORAL
  Filled 2021-12-04: qty 2

## 2021-12-04 MED ORDER — FENTANYL CITRATE (PF) 100 MCG/2ML IJ SOLN
INTRAMUSCULAR | Status: DC | PRN
Start: 2021-12-04 — End: 2021-12-04
  Administered 2021-12-04: 100 ug via INTRAVENOUS

## 2021-12-04 MED ORDER — ACETAMINOPHEN 500 MG PO TABS
1000.0000 mg | ORAL_TABLET | Freq: Four times a day (QID) | ORAL | Status: AC
  Administered 2021-12-05 – 2021-12-09 (×12): 1000 mg via ORAL
  Filled 2021-12-04 (×12): qty 2

## 2021-12-04 SURGICAL SUPPLY — 40 items
BLADE SURG 10 STRL SS (BLADE) ×3 IMPLANT
BLADE SURG 15 STRL LF DISP TIS (BLADE) IMPLANT
BLADE SURG 15 STRL SS (BLADE) ×2
CANISTER SUCT 3000ML PPV (MISCELLANEOUS) ×3 IMPLANT
CANISTER WOUND CARE 500ML ATS (WOUND CARE) ×1 IMPLANT
CNTNR URN SCR LID CUP LEK RST (MISCELLANEOUS) IMPLANT
CONT SPEC 4OZ STRL OR WHT (MISCELLANEOUS) ×2
COVER SURGICAL LIGHT HANDLE (MISCELLANEOUS) ×1 IMPLANT
DRAPE LAPAROSCOPIC ABDOMINAL (DRAPES) ×3 IMPLANT
DRAPE U 60X70 (DRAPES) ×2 IMPLANT
DRAPE WARM FLUID 44X44 (DRAPES) ×1 IMPLANT
DRSG PAD ABDOMINAL 8X10 ST (GAUZE/BANDAGES/DRESSINGS) IMPLANT
DRSG VAC ATS MED SENSATRAC (GAUZE/BANDAGES/DRESSINGS) ×1 IMPLANT
DRSG VERSA FOAM LRG 10X15 (GAUZE/BANDAGES/DRESSINGS) ×1 IMPLANT
ELECT REM PT RETURN 9FT ADLT (ELECTROSURGICAL) ×2
ELECTRODE REM PT RTRN 9FT ADLT (ELECTROSURGICAL) ×2 IMPLANT
GAUZE 4X4 16PLY ~~LOC~~+RFID DBL (SPONGE) ×3 IMPLANT
GLOVE SURG SIGNA 7.5 PF LTX (GLOVE) ×6 IMPLANT
GOWN STRL REUS W/ TWL LRG LVL3 (GOWN DISPOSABLE) ×4 IMPLANT
GOWN STRL REUS W/ TWL XL LVL3 (GOWN DISPOSABLE) ×2 IMPLANT
GOWN STRL REUS W/TWL LRG LVL3 (GOWN DISPOSABLE) ×4
GOWN STRL REUS W/TWL XL LVL3 (GOWN DISPOSABLE) ×2
HANDPIECE INTERPULSE COAX TIP (DISPOSABLE) ×2
HEMOSTAT POWDER SURGIFOAM 1G (HEMOSTASIS) IMPLANT
KIT BASIN OR (CUSTOM PROCEDURE TRAY) ×3 IMPLANT
KIT TURNOVER KIT B (KITS) ×3 IMPLANT
NS IRRIG 1000ML POUR BTL (IV SOLUTION) ×6 IMPLANT
PACK UNIVERSAL I (CUSTOM PROCEDURE TRAY) ×1 IMPLANT
PAD ARMBOARD 7.5X6 YLW CONV (MISCELLANEOUS) ×6 IMPLANT
PENCIL BUTTON HOLSTER BLD 10FT (ELECTRODE) ×1 IMPLANT
SET HNDPC FAN SPRY TIP SCT (DISPOSABLE) IMPLANT
SPONGE T-LAP 18X18 ~~LOC~~+RFID (SPONGE) ×11 IMPLANT
SWAB COLLECTION DEVICE MRSA (MISCELLANEOUS) ×1 IMPLANT
SWAB CULTURE ESWAB REG 1ML (MISCELLANEOUS) ×1 IMPLANT
SYR BULB IRRIG 60ML STRL (SYRINGE) ×1 IMPLANT
TOWEL GREEN STERILE (TOWEL DISPOSABLE) ×3 IMPLANT
TOWEL GREEN STERILE FF (TOWEL DISPOSABLE) ×3 IMPLANT
TUBE CONNECTING 12X1/4 (SUCTIONS) ×1 IMPLANT
WATER STERILE IRR 1000ML POUR (IV SOLUTION) ×3 IMPLANT
YANKAUER SUCT BULB TIP NO VENT (SUCTIONS) ×1 IMPLANT

## 2021-12-04 NOTE — Progress Notes (Signed)
Transferred-in from PACU by bed awake and alert. 

## 2021-12-04 NOTE — Interval H&P Note (Signed)
History and Physical Interval Note: ? ?12/04/2021 ?1:06 PM ? ?David Strong  has presented today for surgery, with the diagnosis of Sterno-clavicular joint abscess.  The various methods of treatment have been discussed with the patient and family. After consideration of risks, benefits and other options for treatment, the patient has consented to  Procedure(s): ?I & D STERNOCLAVICULAR WOUND, WOUND VAC PLCMENT. (N/A) as a surgical intervention.  The patient's history has been reviewed, patient examined, no change in status, stable for surgery.  I have reviewed the patient's chart and labs.  Questions were answered to the patient's satisfaction.   ? ? ?[name] ? ? ?

## 2021-12-04 NOTE — Progress Notes (Deleted)
?PROGRESS NOTE ? ? ? ?My [place]  KWI:[id number] DOB: 01-04-1956 DOA: 11/22/2021 ?PCP: [name], MD  ? ?Brief Narrative:  ?[name]. ? ?Presented to the ED with complaints of productive cough, subjective fever, chills, myalgia, severe arthralgia, new onset erythema and swelling of the left side of the base of the neck.  CTA showed no PE, confirmed right middle lobe pneumonia and bibasilar airspace opacities consistent with multifocal pneumonia and left sternoclavicular joint inflammatory changes with scattered foci of air within the soft tissue and sternoclavicular joint space compatible with left localized sternoclavicular septic arthritis/cellulitis, without evidence of drainable fluid collection or large joint effusion.  Subsequent BC ID and urine antigen positive for strep.  Antibiotics were narrowed down to ceftriaxone.  Newly diagnosed right upper arm SVT and left lower extremity DVT, briefly on IV heparin transition to Eliquis. ?  ?MRI right shoulder findings suspicious for septic arthritis involving AC joint and glenohumeral joint.  Severe myositis and pyomyositis involving the subscapularis muscle, the anterior deltoid muscle and distal aspect of pectoralis muscle.  There is also myositis involving the infraspinatus muscle.  ? ? ?Subjective: ?4/17 afebrile overnight A/O x4, concerned about lower leg edema. ? ? ?Assessment & Plan: ? Covid vaccination; ? ?Principal Problem: ?  Severe sepsis (HCC) ?Active Problems: ?  Pneumococcal bacteremia ?  CAP (community acquired pneumonia) ?  Septic arthritis (HCC) ?  DVT (deep venous thrombosis) (HCC) ?  High anion gap metabolic acidosis ?  Transaminitis ?  Thrombocytopenia (HCC) ?  Type 2 diabetes mellitus (HCC) ?  Hypokalemia ?  Hypophosphatemia ?  Constipation ?  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  Bilateral leg edema ? ? ?Severe sepsis ?-Due to streptococcal multifocal  pneumonia/bacteremia ?-Patient presented with sepsis criteria including tachypnea, tachycardia, fever ?-CT chest confirmed multifocal pneumonia ?-Also found to have polyarticular septic arthritis, right shoulder septic arthritis with right shoulder septic joint, septic sternoclavicular joint with multiple areas of bio myositis on imaging of MRI ?-Patient empirically started on IV vancomycin, ceftriaxone and Zithromax ?-BC ID and urine was positive for Streptococcus ?-Antibiotics narrowed to IV ceftriaxone and then changed to penicillin G on 4/10 plan for 6 weeks of antibiotics till 01/03/2022 ?-ID has seen the patient and recommends 6 weeks of penicillin for endovascular coverage and outpatient TEE ?-TEE pending ?-ID will follow in 3 weeks ?  ?LEFT sternoclavicular joint septic arthritis ?-CTA chest was concerning for septic arthritis ?-ED provider spoke to CT surgeon on-call, who felt that case and imaging were consistent with septic arthritis of left sternoclavicular joint and recommended medical management and IV antibiotics without need for surgical intervention ?-Per ultrasound, there was minimal fluid in the left [place] joint ?-Started on antibiotics as above ?-4/17 s/p I&D LEFT sternoclavicular joint abscess see below ?-4/18 per cardiothoracic surgery will return to the OR this week for additional debridement. ?  ?Septic arthritis of right shoulder ?-MRI right shoulder was ordered as per ID recommendation ?-Which showed septic arthritis involving AC joint and glenohumeral joint-severe myositis and pyomyositis involving subscapularis muscle, anterior deltoid muscle and distal aspect of pectoralis muscle ?-There is also myositis involving the infraspinatus muscle ?-Orthopedics consulted, spoke with Dr. Magnus Ivan; ?-He saw patient and could not aspirate any fluid from the right shoulder  ?-Per Dr. Daune Perch note patient transferred to [facility] for definitive treatment.  CVTS was consulted, Dr. Dorris Fetch saw the  patient and recommended transfer to [facility] for incision and drainage. ?- ?  ?  Left ankle swelling/left knee swelling ?-MRI of left ankle was unremarkable for joint involvement only showed soft tissue inflammation; consistent with cellulitis ?-Left knee is swollen and warm to touch;  ?-Joint aspiration was attempted however no fluid obtained ?-Not consistent with septic arthritis ?  ?Community-acquired pneumonia ?-Started on antibiotics as above ?  ?Acute DVT LLE ?-Right upper extremity superficial vein thrombosis involving cephalic vein ?-Left lower extremity age indeterminate DVT left popliteal vein, acute DVT left gastrocnemius and left posterior tibial veins ?-Patient was briefly on IV heparin, transition to Eliquis ?-Due to complaint of bilateral leg pain and swelling, obtained RLE venous Dopplers which were negative for DVT or SVT ?-I have consulted oncology for further evaluation for this patient with multiple DVTs, hypoalbuminemia, transaminitis ?-We will check CT abdomen/pelvis for underlying metastatic process ?-4/18 continue heparin drip ?  ?Transaminitis ?-Likely in setting of severe sepsis ?-No GI symptoms ?-Hepatitis panel negative ?-Right upper quadrant ultrasound showed gallbladder sludge with no acute findings ?-LFTs have been elevated; unclear etiology ?-GI was consulted, at this time it was felt that LFTs are elevated due to sepsis ?-4/18 resolved except for alkaline phosphatase which is resolving ?  ?Anasarca ?-Patient has swelling of right upper extremity and both lower extremities ?-Albumin is down to 1.9 ?-Dietitian consulted for hypoalbuminemia ?-Started on Nuromax p.o. twice daily, MVI with minerals ?-4/18 patient counseled that swelling secondary to poor nutrition ?  ?Thrombocytopenia ?-Suspect chronic or acute on chronic ?-Platelet count in 2018 was 104 ?-Oncology was consulted.  Dr. Parke PoissonFang will follow patient in 3 months in clinic ?  ?DM type II uncontrolled not on long-term insulin ?-4/6  Hemoglobin A1c= 8.6 ?--Oral hypoglycemics on hold ?-CBG has been elevated ?-4/18 Increase Semglee t 40 units daily ?-4/18 increase NovoLog 8 units qac ?-4/18 decrease moderate SSI ? ?Essential HTN ?- Patient not on home medication: May be secondary to infection/pain ?- Hydralazine PRN ?  ?Hypokalemia ?- Potassium goal> 4 ?-Replete ?  ?Hyponatremia ?- Mild ?- 4/18 Normal Saline 75 ? ?Hypomagnesmia ?- Magnesium goal> 2 ?- 4/18 magnesium IV 3 g ?  ?Hypophosphatemia ?- Phosphorus goal> 2.5 ? ?Obese (BMI 33 kg/m?) ?-PCP to evaluate and treat ? ? ?Mobility Assessment (last 72 hours)   ? ? Mobility Assessment   ? ? Row Name 12/03/21 2036 12/03/21 0745 12/02/21 1920 12/02/21 1301 12/02/21 0730  ? Does patient have an order for bedrest or is patient medically unstable No - Continue assessment No - Continue assessment No - Continue assessment -- No - Continue assessment  ? What is the highest level of mobility based on the progressive mobility assessment? Level 5 (Walks with assist in room/hall) - Balance while stepping forward/back and can walk in room with assist - Complete Level 5 (Walks with assist in room/hall) - Balance while stepping forward/back and can walk in room with assist - Complete Level 5 (Walks with assist in room/hall) - Balance while stepping forward/back and can walk in room with assist - Complete Level 5 (Walks with assist in room/hall) - Balance while stepping forward/back and can walk in room with assist - Complete Level 5 (Walks with assist in room/hall) - Balance while stepping forward/back and can walk in room with assist - Complete  ? Is the above level different from baseline mobility prior to current illness? Yes - Recommend PT order Yes - Recommend PT order Yes - Recommend PT order -- Yes - Recommend PT order  ? ? Row Name 12/01/21 2145 12/01/21 1355 12/01/21 1208 12/01/21  0800  ?  ? Does patient have an order for bedrest or is patient medically unstable No - Continue assessment -- -- No -  Continue assessment   ? What is the highest level of mobility based on the progressive mobility assessment? Level 5 (Walks with assist in room/hall) - Balance while stepping forward/back and can walk in room with assist - Complete Leve

## 2021-12-04 NOTE — Progress Notes (Signed)
RT went to do the ABG and [facility] had already picked the Pt up to take to [facility]. ?

## 2021-12-04 NOTE — Transfer of Care (Signed)
Immediate Anesthesia Transfer of Care Note ? ?Patient: David Strong ? ?Procedure(s) Performed: IRRIGATION AND DEBRIDMENT OF STERNOCLAVICULAR WOUND, WOUND VAC PLACMENT. (Left: Chest) ? ?Patient Location: PACU ? ?Anesthesia Type:General ? ?Level of Consciousness: drowsy ? ?Airway & Oxygen Therapy: Patient Spontanous Breathing ? ?Post-op Assessment: Report given to RN and Post -op Vital signs reviewed and stable ? ?Post vital signs: Reviewed and stable ? ?Last Vitals:  ?Vitals Value Taken Time  ?BP 157/83 12/04/21 1530  ?Temp    ?Pulse 100 12/04/21 1534  ?Resp 17 12/04/21 1534  ?SpO2 90 % 12/04/21 1534  ?Vitals shown include unvalidated device data. ? ?Last Pain:  ?Vitals:  ? 12/04/21 1012  ?TempSrc:   ?PainSc: 2   ?   ? ?Patients Stated Pain Goal: 0 (12/02/21 2232) ? ?Complications: No notable events documented. ?

## 2021-12-04 NOTE — Brief Op Note (Signed)
12/04/2021 ? ?7:50 AM ? ?PATIENT:  David Strong  66 y.o. male ? ?PRE-OPERATIVE DIAGNOSIS:  Sterno-clavicular joint abscess with extension into neck and chest wall ? ?POST-OPERATIVE DIAGNOSIS:  Sterno-clavicular joint abscess with extension into neck and chest wall ? ?PROCEDURE:  INCISION AND DRAINAGE OF LEFT STERNOCLAVICULAR JOINT ABSCESS WITH PULSE LAVAGE ? ?SURGEON:  Surgeon(s) and Role: ?   * [name], MD - Primary ? ?PHYSICIAN ASSISTANT:  ? ?ASSISTANTS: none  ? ?ANESTHESIA:   general ? ?EBL:  100 ml ? ?BLOOD ADMINISTERED:none ? ?DRAINS:  VAC   ? ?LOCAL MEDICATIONS USED:  NONE ? ?SPECIMEN:  Source of Specimen:  synovium ? ?DISPOSITION OF SPECIMEN:   Path and micro ? ?COUNTS:  YES ? ?TOURNIQUET:  * No tourniquets in log * ? ?DICTATION: .Other Dictation: Dictation Number - ? ?PLAN OF CARE: Admit to inpatient  ? ?PATIENT DISPOSITION:  PACU - hemodynamically stable. ?  ?Delay start of Pharmacological VTE agent (>24hrs) due to surgical blood loss or risk of bleeding: yes ? ?

## 2021-12-04 NOTE — Progress Notes (Signed)
Id brief note ? ?[facility] at [facility] for I&D of chest abscess and left [place] joint and hopefully tee ? ?66 yo with pneumococcal bacteremia, right septic arthritis/pyomyositis, [place] joint left side arthritis, worsening pyogenic process in chest wall and [place] joint and supraclavicular joint needing surgical management at [place] ? ?4/16 ct chest repeat ?1. Progressive anterior subluxation of the left sternoclavicular joint with new erosive changes with bony fragmentation along the medial articular surface of the proximal left clavicle. Findings are worrisome for septic arthritis. ?2. Progressive gas and fluid collection centered around the left supraclavicular joint which now extends into the left side of neck. ?There is also involvement of the left pectoralis major muscle which is asymmetrically enlarged and contains foci of gas and fluid. Fluid also is seen extending across the midline anterior to the thyroid gland into the right lower neck. Findings compatible with abscess formation. ?3. LAD coronary artery calcifications. ?4. Patchy area of ground-glass attenuation noted in the central right middle lobe compatible with nonspecific pneumonitis. ?5. Aortic Atherosclerosis ? ?4/10 tte ? 1. Left ventricular ejection fraction, by estimation, is 60 to 65%. The left ventricle has normal function. The left ventricle has no regional wall motion abnormalities. Left ventricular diastolic parameters are indeterminate.  ? 2. Right ventricular systolic function is normal. The right ventricular size is normal. There is normal pulmonary artery systolic pressure.  ? 3. The mitral valve is normal in structure. Trivial mitral valve regurgitation. No evidence of mitral stenosis.  ? 4. The aortic valve is tricuspid. Aortic valve regurgitation is not visualized. No aortic stenosis is present.  ? 5. The inferior vena cava is normal in size with greater than 50% respiratory variability, suggesting right atrial pressure of 3 mmHg.   ? ?Micro: ?4/17 wound cx in process ?4/07 bcx ngtd ?4/05 bcx strep pna ? ?Abx: ?4/10-c pcn g ?4/5-10 ceftriaxone ? ? ?-would continue current abx ?-agree would try to r/o endocarditis although given extensive bone joint involvement will need long treatment course ?-need ongoing monitor on left knee and RUE superficial thrombosis, LLE dvt's ?

## 2021-12-04 NOTE — Anesthesia Preprocedure Evaluation (Addendum)
Anesthesia Evaluation  ?Patient identified by MRN, date of birth, ID band ?Patient awake ? ? ? ?Reviewed: ?Allergy & Precautions, NPO status , Patient's Chart, lab work & pertinent test results ? ?History of Anesthesia Complications ?Negative for: history of anesthetic complications ? ?Airway ?Mallampati: II ? ?TM Distance: >3 FB ?Neck ROM: Full ? ? ? Dental ? ?(+) Dental Advisory Given ?  ?Pulmonary ?former smoker,  ?  ?Pulmonary exam normal ? ? ? ? ? ? ? Cardiovascular ?+ DVT  ?Normal cardiovascular exam ? ? ?'23 TTE - EF 60 to 65%. Trivial MR ? ?  ?Neuro/Psych ?negative neurological ROS ? negative psych ROS  ? GI/Hepatic ?negative GI ROS, Neg liver ROS,   ?Endo/Other  ?diabetes, Type 2, Oral Hypoglycemic Agents ?Obesity ?Na 131 ?Ca 8 ?Cl 94 ? ? Renal/GU ?negative Renal ROS  ? ?  ?Musculoskeletal ? ?(+) Arthritis ,  ? Abdominal ?  ?Peds ? Hematology ? ?(+) Blood dyscrasia, anemia ,  ?On eliquis ?   ?Anesthesia Other Findings ? ? Reproductive/Obstetrics ? ?  ? ? ? ? ? ? ? ? ? ? ? ? ? ?  ?  ? ? ? ? ? ? ? ?Anesthesia Physical ?Anesthesia Plan ? ?ASA: 3 ? ?Anesthesia Plan: General  ? ?Post-op Pain Management: Tylenol PO (pre-op)*  ? ?Induction: Intravenous ? ?PONV Risk Score and Plan: 2 and Treatment may vary due to age or medical condition, Ondansetron and Dexamethasone ? ?Airway Management Planned: Oral ETT ? ?Additional Equipment: None ? ?Intra-op Plan:  ? ?Post-operative Plan: Extubation in OR ? ?Informed Consent: I have reviewed the patients History and Physical, chart, labs and discussed the procedure including the risks, benefits and alternatives for the proposed anesthesia with the patient or authorized representative who has indicated his/her understanding and acceptance.  ? ? ? ?Dental advisory given ? ?Plan Discussed with: CRNA and Anesthesiologist ? ?Anesthesia Plan Comments:   ? ? ? ? ? ?Anesthesia Quick Evaluation ? ?

## 2021-12-04 NOTE — Progress Notes (Signed)
[facility] ? ?PROGRESS NOTE ? ?David Strong ZOX:[id number] DOB: 08/14/1956 DOA: 11/22/2021 ?PCP: David Strong ? ? ?Brief HPI:   ?66 year old male with medical history of diabetes mellitus type 2 presented to the ED with complaints of productive cough, subjective fever, chills, myalgia, severe arthralgia, new onset erythema and swelling of the left side of the base of the neck.  CTA showed no PE, confirmed right middle lobe pneumonia and bibasilar airspace opacities consistent with multifocal pneumonia and left sternoclavicular joint inflammatory changes with scattered foci of air within the soft tissue and sternoclavicular joint space compatible with left localized sternoclavicular septic arthritis/cellulitis, without evidence of drainable fluid collection or large joint effusion.  Subsequent BC ID and urine antigen positive for strep.  Antibiotics were narrowed down to ceftriaxone.  Newly diagnosed right upper arm SVT and left lower extremity DVT, briefly on IV heparin transition to Eliquis. ? ?MRI right shoulder findings suspicious for septic arthritis involving AC joint and glenohumeral joint.  Severe myositis and pyomyositis involving the subscapularis muscle, the anterior deltoid muscle and distal aspect of pectoralis muscle.  There is also myositis involving the infraspinatus muscle.   ? ?Patient was seen by orthopedic surgery, no fluid could be aspirated on joint aspiration from the right shoulder.  He was seen by infectious disease and TEE was recommended, however patient did not want to get inpatient TEE so outpatient TEE was scheduled.  ID recommended to discharge home on 6 weeks of penicillin.  Home health RN/PT was arranged for IV antibiotics.  Eliquis also was sent to the pharmacy. ? ?Before discharge patient complained of worsening swelling of right chest wall, crepitus was palpated with likely subluxation of left clavicle at the sternoclavicular joint.  CT chest with contrast was  obtained which showed progressive anterior subluxation of the left sternoclavicular joint with new erosive changes with bony fragmentation along the medial articular surface of the proximal left clavicle.  Findings on 7 4 septic arthritis.  There was progressive fluid and less collection centered on the left supraclavicular joint which extended to the left side of the neck.  Also Vollman to the left pectoralis muscle containing foci of gas and fluid.  Findings consistent with abscess. ? ?CVTS was consulted, Dr. Dorris FetchHendrickson saw the patient and [facility]. ? ? ?Subjective  ? ?Patient seen and examined, appreciate CVTS input.  [facility] and [facility].  Eliquis 10 mg was given yesterday morning, has been on hold for past 24 hours.  Today was day #7 for Eliquis 10 mg p.o. twice daily. ? ? Assessment/Plan:  ? ?Severe sepsis ?-Sepsis physiology has resolved ?-Due to streptococcal multifocal pneumonia/bacteremia ?-Patient presented with sepsis criteria including tachypnea, tachycardia, fever ?-CT chest confirmed multifocal pneumonia ?-Also found to have polyarticular septic arthritis, right shoulder septic arthritis with right shoulder septic joint, septic sternoclavicular joint with multiple areas of bio myositis on imaging of MRI ?-Patient empirically started on IV vancomycin, ceftriaxone and Zithromax ?-BC ID and urine was positive for Streptococcus pneumonia ?-Antibiotics narrowed to IV ceftriaxone and then changed to penicillin G on 4/10 plan for 6 weeks of antibiotics till 01/03/2022 ?-ID saw  the patient and recommended 6 weeks of penicillin for endovascular coverage and outpatient TEE ?-I called cardiology and they will call patient to set up outpatient TEE ?-However before discharge patient complained of worsening crepitus of left chest wall, CT chest with contrast obtained which shows abscess  at the supraclavicular joint and  septic arthritis at sternoclavicular joint with subluxation of left clavicle. ?-CVTS was consulted, plan for I&D today ?-Patient will be transferred to [facility] today ? ?Left sternoclavicular joint septic arthritis/left supraclavicular abscess ?-CTA chest was concerning for septic arthritis on the day of admission ?-ED provider spoke to CT surgeon on-call, who felt that case and imaging were consistent with septic arthritis of left sternoclavicular joint and recommended medical management and IV antibiotics without need for surgical intervention ?-Per ultrasound, there was minimal fluid in the left Atkins joint ?-Started on antibiotics as above ?-On 4/16 noted possible subluxation of the sternoclavicular joint with worsening swelling of left sternoclavicular joint ?-CT chest with contrast obtained which shows abscess at the supraclavicular joint and septic arthritis at sternoclavicular joint with subluxation of left clavicle.;  ?-Called and discussed with CVTS, Dr. Dorris Fetch saw the patient and recommended transfer to [facility] for incision and drainage ?-Plan to transfer to [facility] today ? ?Septic arthritis of right shoulder ?-MRI right shoulder was ordered as per ID recommendation ?-Which showed septic arthritis involving AC joint and glenohumeral joint-severe myositis and pyomyositis involving subscapularis muscle, anterior deltoid muscle and distal aspect of pectoralis muscle ?-There is also myositis involving the infraspinatus muscle ?-Orthopedics consulted, spoke with Dr. Magnus Ivan; ?-He saw patient and could not aspirate any fluid from the right shoulder  ?-No surgical intervention recommended ? ?Left ankle swelling/left knee swelling ?-MRI of left ankle was unremarkable for joint involvement only showed soft tissue inflammation; consistent with cellulitis ?-Left knee is swollen and warm to touch;  ?-Joint aspiration was attempted however no fluid obtained ?-Not consistent with septic  arthritis ? ?Community-acquired pneumonia ?-Started on antibiotics as above ? ?DVT ?-Right upper extremity superficial vein thrombosis involving cephalic vein ?-Left lower extremity age indeterminate DVT left popliteal vein acute DVT left gastrocnemius and left posterior tibial veins ?-Patient was briefly on IV heparin, transition to Eliquis ?-Due to complaint of bilateral leg pain and swelling, obtained RLE venous Dopplers which were negative for DVT or SVT ?-Oncology was consulted due to multiple DVTs, hypoalbuminemia, transaminitis ?-CT abdomen/pelvis was negative for for underlying metastatic process ?-Patient was started on Eliquis 10 mg twice a day, 7 today was today 12/04/2021.  He was supposed to start Eliquis 5 mg twice a day from tomorrow morning.  Eliquis was held after 10 AM dose on Saturday, 12/03/2021.  His prescription has already been sent to the pharmacy as there was plan to discharge today. ? ?Transaminitis ?-Likely in setting of severe sepsis ?-No GI symptoms ?-Hepatitis panel negative ?-Right upper quadrant ultrasound showed gallbladder sludge with no acute findings ?-LFTs have been elevated; unclear etiology ?-GI was consulted, at this time it was felt that LFTs are elevated due to sepsis ? ?Anasarca ?-Patient has swelling of right upper extremity and both lower extremities ?-Albumin is down to 1.9 ?-Dietitian consulted for hypoalbuminemia ?-Started on protein supplement p.o. twice daily, MVI with minerals ? ?Thrombocytopenia ?-Suspect chronic or acute on chronic ?-Platelet count in 2018 was 104 ?-Oncology was consulted.   ?-Dr. Parke Poisson will follow patient in 3 months in clinic ? ?Diabetes mellitus type 2 ?-Oral hypoglycemics on hold ?-CBG has been elevated ?- continue Semglee to 20 units subcu daily, NovoLog meal coverage to 6 units 3 times daily ?-Continue sliding scale  resistant ? ?Hypokalemia ?-Replete ? ?Hyponatremia ?-Mild ?-Sodium is 131 ?-Follow BMP in  am ? ?Hypophosphatemia ?-Replaced ? ? ? ? ? ?Medications ? ?  ? [facility] Hold]  Chlorhexidine Gluconate Cloth  6 each Topical Daily  ? [MAR Hold] insulin aspart  0-20 Units Subcutaneous TID WC  ? [MAR Hold] insulin aspart  0-5 Units Subcutaneous QHS  ? [MAR Hold] insuli

## 2021-12-04 NOTE — Anesthesia Procedure Notes (Signed)
Procedure Name: Intubation ?Date/Time: 12/04/2021 2:19 PM ?Performed by: Georgia Duff, CRNA ?Pre-anesthesia Checklist: Patient identified, Emergency Drugs available, Suction available and Patient being monitored ?Patient Re-evaluated:Patient Re-evaluated prior to induction ?Oxygen Delivery Method: Circle System Utilized ?Preoxygenation: Pre-oxygenation with 100% oxygen ?Induction Type: IV induction ?Ventilation: Mask ventilation without difficulty ?Laryngoscope Size: Sabra Heck and 2 ?Grade View: Grade II ?Tube type: Oral ?Tube size: 7.5 mm ?Number of attempts: 1 ?Airway Equipment and Method: Stylet and Oral airway ?Placement Confirmation: ETT inserted through vocal cords under direct vision, positive ETCO2 and breath sounds checked- equal and bilateral ?Secured at: 21 cm ?Tube secured with: Tape ?Dental Injury: Teeth and Oropharynx as per pre-operative assessment  ? ? ? ? ?

## 2021-12-05 ENCOUNTER — Encounter (HOSPITAL_COMMUNITY): Payer: Self-pay | Admitting: Thoracic Surgery (Cardiothoracic Vascular Surgery)

## 2021-12-05 ENCOUNTER — Inpatient Hospital Stay (HOSPITAL_COMMUNITY): Payer: Medicare Other

## 2021-12-05 DIAGNOSIS — E669 Obesity, unspecified: Secondary | ICD-10-CM | POA: Diagnosis present

## 2021-12-05 DIAGNOSIS — R6520 Severe sepsis without septic shock: Secondary | ICD-10-CM | POA: Diagnosis not present

## 2021-12-05 DIAGNOSIS — A419 Sepsis, unspecified organism: Secondary | ICD-10-CM | POA: Diagnosis not present

## 2021-12-05 DIAGNOSIS — J189 Pneumonia, unspecified organism: Secondary | ICD-10-CM | POA: Diagnosis not present

## 2021-12-05 DIAGNOSIS — R600 Localized edema: Secondary | ICD-10-CM | POA: Diagnosis not present

## 2021-12-05 DIAGNOSIS — I82402 Acute embolism and thrombosis of unspecified deep veins of left lower extremity: Secondary | ICD-10-CM | POA: Diagnosis not present

## 2021-12-05 LAB — COMPREHENSIVE METABOLIC PANEL
ALT: 38 U/L (ref 0–44)
ALT: 43 U/L (ref 0–44)
AST: 32 U/L (ref 15–41)
AST: 45 U/L — ABNORMAL HIGH (ref 15–41)
Albumin: 1.6 g/dL — ABNORMAL LOW (ref 3.5–5.0)
Albumin: <1.5 g/dL — ABNORMAL LOW (ref 3.5–5.0)
Alkaline Phosphatase: 175 U/L — ABNORMAL HIGH (ref 38–126)
Alkaline Phosphatase: 177 U/L — ABNORMAL HIGH (ref 38–126)
Anion gap: 6 (ref 5–15)
Anion gap: 7 (ref 5–15)
BUN: 10 mg/dL (ref 8–23)
BUN: 9 mg/dL (ref 8–23)
CO2: 29 mmol/L (ref 22–32)
CO2: 29 mmol/L (ref 22–32)
Calcium: 7.7 mg/dL — ABNORMAL LOW (ref 8.9–10.3)
Calcium: 7.9 mg/dL — ABNORMAL LOW (ref 8.9–10.3)
Chloride: 94 mmol/L — ABNORMAL LOW (ref 98–111)
Chloride: 94 mmol/L — ABNORMAL LOW (ref 98–111)
Creatinine, Ser: 0.6 mg/dL — ABNORMAL LOW (ref 0.61–1.24)
Creatinine, Ser: 0.79 mg/dL (ref 0.61–1.24)
GFR, Estimated: >60 mL/min (ref >60)
GFR, Estimated: >60 mL/min (ref >60)
Glucose, Bld: 312 mg/dL — ABNORMAL HIGH (ref 70–99)
Glucose, Bld: 328 mg/dL — ABNORMAL HIGH (ref 70–99)
Potassium: 4.1 mmol/L (ref 3.5–5.1)
Potassium: 4.2 mmol/L (ref 3.5–5.1)
Sodium: 129 mmol/L — ABNORMAL LOW (ref 135–145)
Sodium: 130 mmol/L — ABNORMAL LOW (ref 135–145)
Total Bilirubin: 0.6 mg/dL (ref 0.3–1.2)
Total Bilirubin: 1 mg/dL (ref 0.3–1.2)
Total Protein: 5.3 g/dL — ABNORMAL LOW (ref 6.5–8.1)
Total Protein: 5.7 g/dL — ABNORMAL LOW (ref 6.5–8.1)

## 2021-12-05 LAB — BASIC METABOLIC PANEL
Anion gap: 8 (ref 5–15)
BUN: 5 mg/dL — ABNORMAL LOW (ref 8–23)
CO2: 29 mmol/L (ref 22–32)
Calcium: 7.8 mg/dL — ABNORMAL LOW (ref 8.9–10.3)
Chloride: 92 mmol/L — ABNORMAL LOW (ref 98–111)
Creatinine, Ser: 0.74 mg/dL (ref 0.61–1.24)
GFR, Estimated: >60 mL/min (ref >60)
Glucose, Bld: 244 mg/dL — ABNORMAL HIGH (ref 70–99)
Potassium: 4.2 mmol/L (ref 3.5–5.1)
Sodium: 129 mmol/L — ABNORMAL LOW (ref 135–145)

## 2021-12-05 LAB — CBC WITH DIFFERENTIAL/PLATELET
Abs Immature Granulocytes: 0.05 10*3/uL (ref 0.00–0.07)
Abs Immature Granulocytes: 0.05 10*3/uL (ref 0.00–0.07)
Basophils Absolute: 0 10*3/uL (ref 0.0–0.1)
Basophils Absolute: 0 10*3/uL (ref 0.0–0.1)
Basophils Relative: 0 %
Basophils Relative: 0 %
Eosinophils Absolute: 0 10*3/uL (ref 0.0–0.5)
Eosinophils Absolute: 0 10*3/uL (ref 0.0–0.5)
Eosinophils Relative: 0 %
Eosinophils Relative: 0 %
HCT: 32 % — ABNORMAL LOW (ref 39.0–52.0)
HCT: 32.8 % — ABNORMAL LOW (ref 39.0–52.0)
Hemoglobin: 10.3 g/dL — ABNORMAL LOW (ref 13.0–17.0)
Hemoglobin: 10.9 g/dL — ABNORMAL LOW (ref 13.0–17.0)
Immature Granulocytes: 1 %
Immature Granulocytes: 1 %
Lymphocytes Relative: 15 %
Lymphocytes Relative: 18 %
Lymphs Abs: 1 10*3/uL (ref 0.7–4.0)
Lymphs Abs: 1.2 10*3/uL (ref 0.7–4.0)
MCH: 28.6 pg (ref 26.0–34.0)
MCH: 29.9 pg (ref 26.0–34.0)
MCHC: 32.2 g/dL (ref 30.0–36.0)
MCHC: 33.2 g/dL (ref 30.0–36.0)
MCV: 88.9 fL (ref 80.0–100.0)
MCV: 89.9 fL (ref 80.0–100.0)
Monocytes Absolute: 0.4 10*3/uL (ref 0.1–1.0)
Monocytes Absolute: 0.5 10*3/uL (ref 0.1–1.0)
Monocytes Relative: 6 %
Monocytes Relative: 7 %
Neutro Abs: 5.1 10*3/uL (ref 1.7–7.7)
Neutro Abs: 5.2 10*3/uL (ref 1.7–7.7)
Neutrophils Relative %: 75 %
Neutrophils Relative %: 77 %
Platelets: 386 10*3/uL (ref 150–400)
Platelets: 406 10*3/uL — ABNORMAL HIGH (ref 150–400)
RBC: 3.6 MIL/uL — ABNORMAL LOW (ref 4.22–5.81)
RBC: 3.65 MIL/uL — ABNORMAL LOW (ref 4.22–5.81)
RDW: 13.5 % (ref 11.5–15.5)
RDW: 13.6 % (ref 11.5–15.5)
WBC: 6.8 10*3/uL (ref 4.0–10.5)
WBC: 6.9 10*3/uL (ref 4.0–10.5)
nRBC: 0 % (ref 0.0–0.2)
nRBC: 0 % (ref 0.0–0.2)

## 2021-12-05 LAB — APTT: aPTT: 38 seconds — ABNORMAL HIGH (ref 24–36)

## 2021-12-05 LAB — GLUCOSE, CAPILLARY
Glucose-Capillary: 240 mg/dL — ABNORMAL HIGH (ref 70–99)
Glucose-Capillary: 286 mg/dL — ABNORMAL HIGH (ref 70–99)
Glucose-Capillary: 279 mg/dL — ABNORMAL HIGH (ref 70–99)
Glucose-Capillary: 187 mg/dL — ABNORMAL HIGH (ref 70–99)

## 2021-12-05 LAB — PROTIME-INR
INR: 1.5 — ABNORMAL HIGH (ref 0.8–1.2)
Prothrombin Time: 17.8 seconds — ABNORMAL HIGH (ref 11.4–15.2)

## 2021-12-05 LAB — MAGNESIUM
Magnesium: 1.5 mg/dL — ABNORMAL LOW (ref 1.7–2.4)
Magnesium: 1.6 mg/dL — ABNORMAL LOW (ref 1.7–2.4)

## 2021-12-05 LAB — PHOSPHORUS
Phosphorus: 2.4 mg/dL — ABNORMAL LOW (ref 2.5–4.6)
Phosphorus: 3 mg/dL (ref 2.5–4.6)

## 2021-12-05 LAB — HEPARIN LEVEL (UNFRACTIONATED): Heparin Unfractionated: 0.46 IU/mL (ref 0.30–0.70)

## 2021-12-05 LAB — LACTIC ACID, PLASMA: Lactic Acid, Venous: 1 mmol/L (ref 0.5–1.9)

## 2021-12-05 LAB — SURGICAL PATHOLOGY

## 2021-12-05 IMAGING — DX DG CHEST 1V PORT
1 series · 1 of 1 positions shown · non-contrast
Comparison: Radiographs [DATE] and CT [DATE].

CLINICAL DATA: Chest pain with shortness of breath. History of
irrigation and debridement of sternoclavicular wound and wound VAC
placement yesterday.

EXAM:
PORTABLE CHEST 1 VIEW

[chest ap]
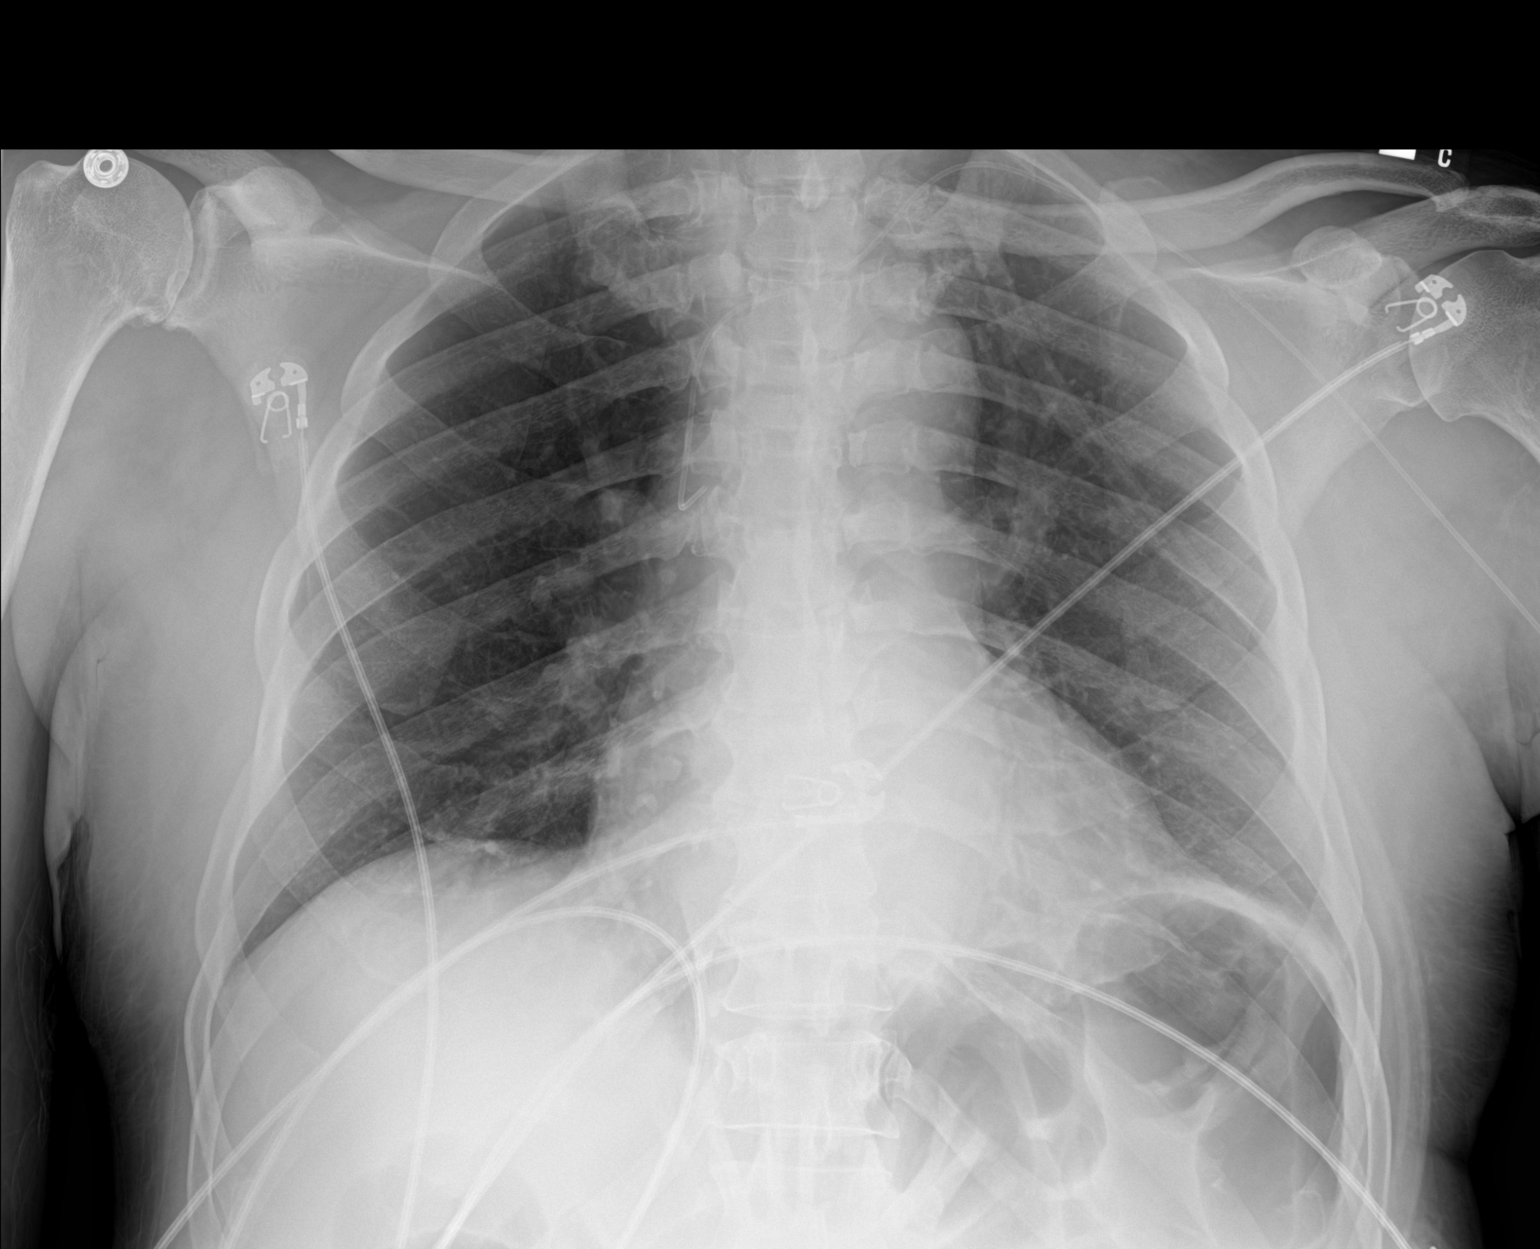

[1 of 1 positions shown; findings below may reference images not displayed]

FINDINGS: [4E] hours. The heart size and mediastinal contours are stable. Left
subclavian PICC appears unchanged from recent CT, protruding
slightly into the azygous vein. New surgical drain overlies the
medial aspect of the left clavicle, and there is persistent left
supraclavicular soft tissue swelling, slightly improved from recent
CT. No progressive osseous erosion or acute osseous findings
identified.

There is stable mild atelectasis at the left lung base. No pleural
effusion or pneumothorax.
IMPRESSION: 1. Interval debridement of previously demonstrated abscess
surrounding the left sternoclavicular joint with drain placement.
2. No acute cardiopulmonary process identified. Stable mild left
basilar atelectasis.
3. Left arm PICC protrudes into the azygous vein, unchanged from CT
of 2 days ago.

## 2021-12-05 MED ORDER — SODIUM CHLORIDE 0.9 % IV SOLN: INTRAVENOUS | Status: DC

## 2021-12-05 MED ORDER — INSULIN ASPART 100 UNIT/ML IJ SOLN
8.0000 [IU] | Freq: Three times a day (TID) | INTRAMUSCULAR | Status: DC
  Administered 2021-12-05 – 2021-12-18 (×25): 8 [IU] via SUBCUTANEOUS

## 2021-12-05 MED ORDER — MAGNESIUM SULFATE 50 % IJ SOLN
3.0000 g | Freq: Once | INTRAVENOUS | Status: AC
  Administered 2021-12-05: 3 g via INTRAVENOUS
  Filled 2021-12-05 (×2): qty 6

## 2021-12-05 MED ORDER — INSULIN GLARGINE-YFGN 100 UNIT/ML ~~LOC~~ SOLN
40.0000 [IU] | Freq: Every day | SUBCUTANEOUS | Status: DC
  Administered 2021-12-05 – 2021-12-10 (×6): 40 [IU] via SUBCUTANEOUS
  Filled 2021-12-05 (×7): qty 0.4

## 2021-12-05 MED ORDER — HEPARIN (PORCINE) 25000 UT/250ML-% IV SOLN
1600.0000 [IU]/h | INTRAVENOUS | Status: DC
  Administered 2021-12-05 – 2021-12-06 (×2): 1250 [IU]/h via INTRAVENOUS
  Administered 2021-12-07: 1600 [IU]/h via INTRAVENOUS
  Filled 2021-12-05 (×3): qty 250

## 2021-12-05 NOTE — Progress Notes (Signed)
IV consult placed for PIV insertion for heparin gtt. Patient has a single lumen PICC in left UA. Restricted extremity bracelet present on BUE. Spoke with primary RN, Charita to ask why his right arm is restricted but she was unsure. She will reach out to provider to determine if the left arm restriction can be lifted, otherwise PIV options are limited. She will place a new consult once speaking with him. ? ?Audria Takeshita Loyola Mast, RN ? ?

## 2021-12-05 NOTE — Progress Notes (Signed)
Nutrition Follow-up ? ?DOCUMENTATION CODES:  ? ?Not applicable ? ?INTERVENTION:  ? ?- Continue Ensure Max po BID, each supplement provides 150 kcal and 30 grams of protein ? ?- Double protein portions TID with meals ? ?- Continue MVI with minerals daily ? ?- Encourage PO intake ? ?NUTRITION DIAGNOSIS:  ? ?Increased nutrient needs related to acute illness as evidenced by estimated needs. ? ?Ongoing, being addressed via oral nutrition supplements ? ?GOAL:  ? ?Patient will meet greater than or equal to 90% of their needs ? ?Progressing ? ?MONITOR:  ? ?PO intake, Supplement acceptance, Labs, Weight trends, Skin, I & O's ? ?REASON FOR ASSESSMENT:  ? ?Malnutrition Screening Tool ?  ? ?ASSESSMENT:  ? ?66 year old male with medical history of type 2 DM. He presented to the ED with several day hx of productive cough, subjective fevers, chills, myalgia, severe arthralgia, and new onset erythema and swelling on the L side of the base of the neck. CTA showed no PE, confirmed RML and bibasilar airspace opacities consistent with multifocal pneumonia and L sternoclavicular joint inflammatory changes with scattered foci of air within the soft tissues and sternoclavicular joint space compatible with localized L sternoclavicular septic arthritis/cellulitis, without evidence of drainable fluid collection or large joint effusion. Admitted for severe sepsis d/t streptococcal pneumonia with bacteremia and L sternoclavicular joint septic arthritis. ? ?04/16 - CT chest concerning for abscess at the supraclavicular joint and septic arthritis at sternoclavicular joint with subluxation of L clavicle ?04/17 - transferred to [facility], s/p I&D of sternoclavicular wound with VAC placement ? ?Spoke with pt at bedside. Pt sleeping at time of visit but awoke to RD voice. Noted untouched breakfast meal tray at bedside. ? ?Pt reports that his appetite has been fine. He states that he did not eat any of his breakfast because he was tired. He reports that he  did drink apple juice. RD discussed with pt the importance of adequate kcal and protein intake in wound healing and maintaining lean muscle mass. Discussed pt's increased nutrient needs. Pt expresses understanding and states that he plans to eat lunch. Pt had not yet made a lunch order so RD took order and called it in to [facility] (grilled chicken, potatoes, vegetable, apple juice). ? ?Pt reports that he has been consuming Ensure Max supplements. Will continue these along with daily MVI with minerals. RD to add double protein portions TID with meals. ? ?Admit weight: 68 kg ?Current weight: 82.2 kg ? ?Pt with mild pitting edema to BUE and moderate pitting edema to BLE. ? ?Meal Completion: 0-100% ? ?Medications reviewed and include: dulcolax, SSI, novolog 8 units TID with meals, semglee 40 units daily, MVI with minerals, miralax, Ensure Max BID, senna, simethicone, heparin drip, IV abx ?IVF: NS @ 75 ml/hr ? ?Labs reviewed: sodium 131, hemoglobin 10.0 ?CBG's: 176-279 x 24 hours ? ?UOP: 2050 ml x 24 hours ?VAC: 50 ml x 24 hours ?I/O's: -2.2 L since admit ? ?Diet Order:   ?Diet Order   ? ?       ?  Diet Carb Modified Fluid consistency: Thin; Room service appropriate? Yes with Assist  Diet effective now       ?  ?  Diet - low sodium heart healthy       ?  ? ?  ?  ? ?  ? ? ?EDUCATION NEEDS:  ? ?Education needs have been addressed ? ?Skin:  Skin Assessment: ?Skin Integrity Issues: ?Wound VAC: chest ? ?Last BM:  12/06/21 ? ?Height:  ? ?  Ht Readings from Last 1 Encounters:  ?12/04/21 5\' 4"  (1.626 m)  ? ? ?Weight:  ? ?Wt Readings from Last 1 Encounters:  ?12/06/21 82.2 kg  ? ? ?Ideal Body Weight:  59.1 kg ? ?BMI:  Body mass index is 31.11 kg/m?. ? ?Estimated Nutritional Needs:  ? ?Kcal:  2100-2300 kcal ? ?Protein:  115-130 grams ? ?Fluid:  >/= 2.3 L/day ? ? ? ?Gustavus Bryant, MS, RD, LDN ?Inpatient Clinical Dietitian ?Please see AMiON for contact information. ? ?

## 2021-12-05 NOTE — Progress Notes (Signed)
ANTICOAGULATION CONSULT NOTE - Follow Up Consult ? ?Pharmacy Consult for Heparin ?Indication: DVT ? ?Allergies  ?Allergen Reactions  ? Codeine Itching  ? Glipizide Er [Glipizide]   ?  SOB, malaise. He tolerates XL without issues.  ? Metformin And Related Rash  ? ? ?Patient Measurements: ?Height: 5\' 4"  (162.6 cm) ?Weight: 81.8 kg (180 lb 5.4 oz) ?IBW/kg (Calculated) : 59.2 ?Heparin Dosing Weight:  77 kg ? ?Vital Signs: ?Temp: 98.2 ?F (36.8 ?C) (04/18 1937) ?Temp Source: Oral (04/18 1937) ?BP: 135/69 (04/18 1937) ?Pulse Rate: 92 (04/18 1937) ? ?Labs: ?Recent Labs  ?  12/04/21 ?2350 12/05/21 ?0700 12/05/21 ?1303 12/05/21 ?2025  ?HGB 10.3*  --  10.9*  --   ?HCT 32.0*  --  32.8*  --   ?PLT 406*  --  386  --   ?APTT 38*  --   --   --   ?LABPROT 17.8*  --   --   --   ?INR 1.5*  --   --   --   ?HEPARINUNFRC  --   --   --  0.46  ?CREATININE 0.79 0.74 0.60*  --   ? ? ? ?Estimated Creatinine Clearance: 88.8 mL/min (A) (by C-G formula based on SCr of 0.6 mg/dL (L)). ? ? ?Medical History: ?Past Medical History:  ?Diagnosis Date  ? Diabetes mellitus without complication ([place])   ? ? ?Assessment: ?AC/Heme: ?D-dimer 5.37.  CTa chest negative for PE. + new L DVT. Thrombocytopenia appears chronic ? ?Patient was changed from heparin drip to appixaban 4/10.  Apixaban held for debridement 4/16 and heparin now ordered as bridge while apixaban on hold  ?No bolus and plans to return to OR for debridement and VAC change later this week.   ? ?HL came back therapeutic tonight. We will continue with the same rate and confirm level in AM.  ?Goal of Therapy:  ?Heparin level 0.3-0.5  ?Monitor platelets by anticoagulation protocol: Yes ?  ?Plan:  ?Cont Heparin infusion at 1250 units/hr ?Daily HL and CBC ? ?Onnie Boer, PharmD, BCIDP, AAHIVP, CPP ?Infectious Disease Pharmacist ?12/05/2021 9:11 PM ? ? ? ? ?

## 2021-12-05 NOTE — Op Note (Signed)
NAME: David Strong ?MEDICAL RECORD NO: [id number] ?ACCOUNT NO: [id number] ?DATE OF BIRTH: 1956/05/22 ?FACILITY: [facility] ?LOCATION: [facility]CC ?PHYSICIAN: Salvatore Decent. Dorris Fetch, MD ? ?Operative Report  ? ?DATE OF PROCEDURE: 12/04/2021 ? ?PREOPERATIVE DIAGNOSIS:  Sternoclavicular joint abscess with extension into the neck and chest wall. ? ?POSTOPERATIVE DIAGNOSIS:  Sternoclavicular joint abscess with extension into the neck and chest wall. ? ?PROCEDURE:  Incision and drainage of left sternoclavicular joint abscess with excisional debridement and pulse lavage. ? ?SURGEON:  Salvatore Decent. Dorris Fetch, MD ? ?ASSISTANT:  None. ? ?ANESTHESIA:  General. ? ?FINDINGS:  Sternoclavicular joint completely destroyed.  Head of the clavicle free of any attachments, necrotic debris extension into the neck encircling the sternal head of the sternocleidomastoid muscle, extension beneath the pectoralis. ? ?CLINICAL NOTE: David Strong is a 66 year old gentleman who was admitted with community-acquired pneumonia, but developed shoulder and left sternoclavicular joint inflammation.  He was treated with antibiotics, but over time he had progressive swelling  ?and subluxation of the sternoclavicular joint.  CT showed disruption of the joint with extension of the abscess into the neck and chest wall.  The patient was advised to undergo incision and drainage, debridement and VAC placement.  The indications,  ?risks, benefits, and alternatives were discussed in detail with the patient.  He understood and accepted the risks and agreed to proceed. ? ?OPERATIVE NOTE:  David Strong was brought to the operating room on 12/04/2021.  He had induction of general anesthesia.  Intravenous antibiotics were administered.  Sequential compression devices were placed on the calves for DVT prophylaxis.  A Bair  ?Hugger was placed for active warming.  The neck and chest were prepped and draped in the usual sterile fashion. ? ?A timeout was performed.  An incision was made  centered over the sternoclavicular joint.  It was carried through the skin and subcutaneous tissue.  The joint space was encountered and there was some necrotic synovium.  The joint was completely disrupted.   ?The head of the clavicle was not attached at any location to the sternum.  Necrotic tissue was debrided.  The first rib was visible.  There was extension of the abscess under the pectoralis inferiorly.  The abscess extended around the sternal head ? of the sternocleidomastoid muscle. After conservative debridement, the wound was pulse lavaged with 3 liters of saline.  Hemostasis was achieved.  A VAC sponge was cut to fit. Two pieces of white foam were used below the head of the clavicle and  ?extending up into the neck and then a small piece of black sponge was placed into the subpectoral pocket and then the wound itself was packed with a VAC sponge.  The wound was approximately 8 cm in length, 4 cm in width and 5 cm in depth.  Vacuum was  ?applied to the sponge and there was a good seal.  The patient then was extubated in the operating room and taken to the Postanesthetic Care Unit in good condition.  All sponge, needle and instrument counts were correct at the end of the procedure. ? ? ? ? ?PAA ?D: 12/04/2021 5:20:30 pm T: 12/05/2021 12:55:00 am  ?JOB: [id number]/ [id number]  ?

## 2021-12-05 NOTE — Progress Notes (Addendum)
ANTICOAGULATION CONSULT NOTE - Follow Up Consult ? ?Pharmacy Consult for Heparin ?Indication: DVT ? ?Allergies  ?Allergen Reactions  ? Codeine Itching  ? Glipizide Er [Glipizide]   ?  SOB, malaise. He tolerates XL without issues.  ? Metformin And Related Rash  ? ? ?Patient Measurements: ?Height: 5\' 4"  (162.6 cm) ?Weight: 81.8 kg (180 lb 5.4 oz) ?IBW/kg (Calculated) : 59.2 ?Heparin Dosing Weight:  77 kg ? ?Vital Signs: ?Temp: 98.4 ?F (36.9 ?C) (04/18 0736) ?Temp Source: Oral (04/18 0736) ?BP: 157/109 (04/18 0736) ?Pulse Rate: 107 (04/18 0736) ? ?Labs: ?Recent Labs  ?  12/02/21 ?1040 12/04/21 ?2350 12/05/21 ?0700  ?HGB 12.3* 10.3*  --   ?HCT 36.4* 32.0*  --   ?PLT 358 406*  --   ?APTT  --  38*  --   ?LABPROT  --  17.8*  --   ?INR  --  1.5*  --   ?CREATININE  --  0.79 0.74  ? ? ? ?Estimated Creatinine Clearance: 88.8 mL/min (by C-G formula based on SCr of 0.74 mg/dL). ? ? ?Medical History: ?Past Medical History:  ?Diagnosis Date  ? Diabetes mellitus without complication ([facility])   ? ? ?Assessment: ?AC/Heme: ?D-dimer 5.37.  CTa chest negative for PE. + new L DVT. Thrombocytopenia appears chronic ? ?Patient was changed from heparin drip to appixaban 4/10.  Apixaban held for debridement 4/16 and heparin now ordered as bridge while apixaban on hold  ?No bolus and plans to return to OR for debridement and VAC change later this week.   ? ? ?Goal of Therapy:  ?Heparin level 0.3-0.5  ?Monitor platelets by anticoagulation protocol: Yes ?  ?Plan:  ?Begin Heparin infusion at 1250 units/hr ?Check heparin level in 6hr after start ?Daily HL and CBC ? ? ? ?5/16 Pharm.D. CPP, BCPS ?Clinical Pharmacist ?[phone] ?12/05/2021 9:23 AM  ? ?12/07/2021 ?12/05/2021,9:19 AM ? ? ?

## 2021-12-05 NOTE — Progress Notes (Addendum)
Physical Therapy Treatment ?Patient Details ?Name: Allyn Bertoni ?MRN: [id number] ?DOB: [birth year] ?Today's Date: 12/05/2021 ? ? ?History of Present Illness Pt. is a 66 y.o. M presenting to [facility] 4/5 Admitted for severe sepsis, POA due to streptococcal pneumonia with bacteremia, left sternoclavicular joint septic arthritis and other large joint arthralgia/arthritis (right shoulder, left knee). Pt underwent I&D of [place] joint with wound vac placement on 4/17. PMH significant for T2DM.  CTA showed no PE, confirmed multifocal pneumonia and MRI shows left sternoclavicular joint inflammatory changes with scattered foci of air within the soft tissues and sternoclavicular joint space compatible with localized left sternoclavicular septic arthritis/cellulitis, without evidence of drainable fluid collection or large joint effusion.  Subsequent BCID and urine antigen positive for strep.  Antibiotics narrowed to IV ceftriaxone.  IR ultrasound shows +RUE superficial thrombosis and L LE DVT. ? ?  ?PT Comments  ? ? Pt agreeable to participate in therapy session with encouragement. Presents with decreased functional use of BUE's, impaired balance, and activity tolerance. Pt ambulating room distances with no assistive device at a min guard assist level. Pt deferring ambulating in hallway. Discussed with MD Joseph Art regarding LUE precautions; recommended limiting excessive LUE use for now pending upcoming additional debridements. Will follow up. ?   ?Recommendations for follow up therapy are one component of a multi-disciplinary discharge planning process, led by the attending physician.  Recommendations may be updated based on patient status, additional functional criteria and insurance authorization. ? ?Follow Up Recommendations ? Home health PT ?  ?  ?Assistance Recommended at Discharge Intermittent Supervision/Assistance  ?Patient can return home with the following A little help with walking and/or transfers;A little help with  bathing/dressing/bathroom;Assistance with cooking/housework;Assist for transportation;Help with stairs or ramp for entrance ?  ?Equipment Recommendations ? Other (comment) (TBA)  ?  ?Recommendations for Other Services   ? ? ?  ?Precautions / Restrictions Precautions ?Precautions: Fall;Other (comment) ?Precaution Comments: RUE DVT, wound vac ?Restrictions ?Other Position/Activity Restrictions: 4/18 Per MD Lucretia Roers limit excessive use of LUE  ?  ? ?Mobility ? Bed Mobility ?Overal bed mobility: Needs Assistance ?Bed Mobility: Supine to Sit ?  ?  ?Supine to sit: Min assist ?  ?  ?General bed mobility comments: Assist at trunk to elevate ?  ? ?Transfers ?Overall transfer level: Needs assistance ?Equipment used: None ?Transfers: Sit to/from Stand ?Sit to Stand: Supervision ?  ?  ?  ?  ?  ?  ?  ? ?Ambulation/Gait ?Ambulation/Gait assistance: Min guard ?Gait Distance (Feet): 30 Feet ?Assistive device: None ?Gait Pattern/deviations: Step-through pattern, Decreased stride length, Trunk flexed ?Gait velocity: decreased ?  ?  ?General Gait Details: Cues for upright posture, decreased bilateral foot clearance, min guard for safety ? ? ?Stairs ?  ?  ?  ?  ?  ? ? ?Wheelchair Mobility ?  ? ?Modified Rankin (Stroke Patients Only) ?  ? ? ?  ?Balance Overall balance assessment: Needs assistance ?Sitting-balance support: Feet supported ?Sitting balance-Leahy Scale: Good ?  ?  ?Standing balance support: No upper extremity supported, During functional activity ?Standing balance-Leahy Scale: Fair ?  ?  ?  ?  ?  ?  ?  ?  ?  ?  ?  ?  ?  ? ?  ?Cognition Arousal/Alertness: Awake/alert ?Behavior During Therapy: [facility] for tasks assessed/performed ?Overall Cognitive Status: No family/caregiver present to determine baseline cognitive functioning ?  ?  ?  ?  ?  ?  ?  ?  ?  ?  ?  ?  ?  ?  ?  ?  ?  General Comments: Pt not receptive to eduction ?  ?  ? ?  ?Exercises   ? ?  ?General Comments   ?  ?  ? ?Pertinent Vitals/Pain Pain Assessment ?Pain Assessment:  Faces ?Faces Pain Scale: Hurts a little bit ?Pain Location: RUE, L shoulder ?Pain Descriptors / Indicators: Sore ?Pain Intervention(s): Monitored during session  ? ? ?Home Living   ?  ?  ?  ?  ?  ?  ?  ?  ?  ?   ?  ?Prior Function    ?  ?  ?   ? ?PT Goals (current goals can now be found in the care plan section) Acute Rehab PT Goals ?Patient Stated Goal: less pain ?Potential to Achieve Goals: Good ? ?  ?Frequency ? ? ? Min 3X/week ? ? ? ?  ?PT Plan Current plan remains appropriate  ? ? ?Co-evaluation   ?  ?  ?  ?  ? ?  ?AM-PAC PT "6 Clicks" Mobility   ?Outcome Measure ? Help needed turning from your back to your side while in a flat bed without using bedrails?: None ?Help needed moving from lying on your back to sitting on the side of a flat bed without using bedrails?: A Little ?Help needed moving to and from a bed to a chair (including a wheelchair)?: A Little ?Help needed standing up from a chair using your arms (e.g., wheelchair or bedside chair)?: A Little ?Help needed to walk in hospital room?: A Little ?Help needed climbing 3-5 steps with a railing? : A Lot ?6 Click Score: 18 ? ?  ?End of Session   ?Activity Tolerance: Patient limited by pain ?Patient left: with call bell/phone within reach;Other (comment) (sitting EOB) ?Nurse Communication: Mobility status ?PT Visit Diagnosis: Difficulty in walking, not elsewhere classified (R26.2);Pain ?Pain - part of body: Knee;Shoulder ?  ? ? ?Time: 7564-3329 ?PT Time Calculation (min) (ACUTE ONLY): 30 min ? ?Charges:  $Therapeutic Activity: 23-37 mins          ?          ? ?Lillia Pauls, PT, DPT ?Acute Rehabilitation Services ?Pager [phone] ?Office [phone] ? ? ? ?Carloine Ernestina Penna ?12/05/2021, 4:54 PM ? ?

## 2021-12-05 NOTE — Progress Notes (Signed)
?   ? ? ? ? ?[place] for Infectious Disease ? ?Date of Admission:  11/22/2021   Total days of inpatient antibiotics 9 ?Principal Problem: ?  Severe sepsis ([place]) ?Active Problems: ?  Thrombocytopenia ([place]) ?  Type 2 diabetes mellitus ([place]) ?  CAP (community acquired pneumonia) ?  High anion gap metabolic acidosis ?  Septic arthritis ([place]) ?  Transaminitis ?  Pneumococcal bacteremia ?  Hypokalemia ?  Hypophosphatemia ?  DVT (deep venous thrombosis) ([place]) ?  Constipation ?  Septic arthritis of left sternoclavicular joint ([place]) ?  Bilateral leg edema ?     ?    ?Assessment: ?68 YM admitted with pneumonia after presenting with cough for 2-3 days found to have strep pneumo bacteremia and multiple  septic joints and pyomyositis. ? 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 ? ?-Seen by Ortho on 4/13 and multiple areas examined. . No fluid was able to be aspirated around subacromial outlet, shoulder, glenohumeral joint. No fluid sound on aspiration of right shoulder and left knee on 4/12. Felt to fit myositis picture. No current plans for operative intervention.  ?-Pt likely had pneumonia 2/2 strep pneumo leading to bacteremia and likely endocarditis. TTE did not show vegetation. Given pt's multiple foci of infection/thrombus it is suspicious for septic emboli as such plan to get TEE.  ? ?-Initially pt planned to go home. Noted to have increased chest swelling. CT chest showed findings concerning for abscess extending from left supraclavicular join to neck, left  pectoralis major involved. Ground glass attenuation RML. Bony fragmentation along proximal left clavicle.  ?-CTS engaged and pt transferred to [facility] for I&D of left [place] joint and excision of abscess on 4/17. OR findings noted  joint completely destroyed, necrotic debris extending into the neck. Head of clavicle free of attachments.  ? ?Recommendations: ?-Continue Penicillin (Anticipate 6 weeks from I&D) ?-TEE ordered(I discussed I was concerned about endovascular infection with pt, wife and son at bedside).  ?-Follow-up I&D CX with CTS from 12/04/21 ? ?Microbiology:   ?Antibiotics: ?Azithormycin 4/5 ?Cefepime 4/5 ?Ceftriaxone 4/5-4/9 ?Penicillin 4/10-p ?Cultures: ?Blood ?4/5 2/2 strep pneumo ?4/6 NG ?4/7 NG ? ?4/17 OR I&D of chest abscess ? ?SUBJECTIVE: ?Resting in bed. Family at bedside.  ? ?Review of Systems: ?Review of Systems  ?All other systems reviewed and are negative. ? ? ?Scheduled Meds: ? acetaminophen  1,000 mg Oral Q6H  ? Or  ? acetaminophen (TYLENOL) oral liquid 160 mg/5 mL  1,000 mg Oral Q6H  ? bisacodyl  10 mg Oral Daily  ? Chlorhexidine Gluconate Cloth  6 each Topical Daily  ? insulin aspart  0-15 Units Subcutaneous TID WC  ? insulin aspart  0-5 Units Subcutaneous QHS  ? insulin aspart  8 Units Subcutaneous TID WC  ? insulin glargine-yfgn  40 Units Subcutaneous Daily  ? lidocaine  1 patch Transdermal Q24H  ? multivitamin with minerals  1 tablet Oral Daily  ? polyethylene glycol  17 g Oral BID  ? Ensure Max Protein  11 oz Oral BID  ? senna-docusate  1 tablet Oral QHS  ? senna-docusate  2 tablet Oral Daily  ? simethicone  80 mg Oral QID  ? ?  Continuous Infusions: ? heparin    ? penicillin g continuous IV infusion 12 Million Units (12/05/21 [id number])  ? ?PRN Meds:.acetaminophen **OR** [DISCONTINUED] acetaminophen, alum & mag hydroxide-simeth, benzonatate, bisacodyl, fentaNYL (SUBLIMAZE) injection, hydrALAZINE, naLOXone (NARCAN)  injection, ondansetron (ZOFRAN) IV, oxyCODONE, oxyCODONE, sodium chloride  flush ?Allergies  ?Allergen Reactions  ? Codeine Itching  ? Glipizide Er [Glipizide]   ?  SOB, malaise. He tolerates XL without issues.  ? Metformin And Related Rash  ? ? ?OBJECTIVE: ?Vitals:  ? 12/05/21 0430 12/05/21 0431 12/05/21 0736 12/05/21 1126  ?BP:   (!) 157/109 (!) 154/71  ?Pulse:   (!) 107 95  ?Resp: 14 14 18 13   ?Temp:   98.4 ?F (36.9 ?C) 98.4 ?F (36.9 ?C)  ?TempSrc:   Oral Oral  ?SpO2:   94% 93%  ?Weight:  81.8 kg    ?Height:      ? ?Body mass index is 30.95 kg/m?. ? ?Physical Exam ?Constitutional:   ?   General: He is not in acute distress. ?   Appearance: He is normal weight. He is not toxic-appearing.  ?HENT:  ?   Head: Normocephalic and atraumatic.  ?   Right Ear: External ear normal.  ?   Left Ear: External ear normal.  ?   Nose: No congestion or rhinorrhea.  ?   Mouth/Throat:  ?   Mouth: Mucous membranes are moist.  ?   Pharynx: Oropharynx is clear.  ?Eyes:  ?   Extraocular Movements: Extraocular movements intact.  ?   Conjunctiva/sclera: Conjunctivae normal.  ?   Pupils: Pupils are equal, round, and reactive to light.  ?Cardiovascular:  ?   Rate and Rhythm: Normal rate and regular rhythm.  ?   Heart sounds: No murmur heard. ?  No friction rub. No gallop.  ?   Comments: Superior chest wall edematous and tender.  ?Pulmonary:  ?   Effort: Pulmonary effort is normal.  ?   Breath sounds: Normal breath sounds.  ?Abdominal:  ?   General: Abdomen is flat. Bowel sounds are normal.  ?   Palpations: Abdomen is soft.  ?Musculoskeletal:     ?   General: No swelling. Normal range of motion.  ?   Cervical back: Normal range of motion and neck supple.  ?Skin: ?   General: Skin is warm and dry.  ?Neurological:  ?   General: No focal deficit present.  ?   Mental Status: He is oriented to person, place, and time.  ?Psychiatric:     ?   Mood and Affect: Mood normal.  ? ? ? ? ?Lab Results ?Lab Results  ?Component Value Date  ? WBC 6.8 12/04/2021  ? HGB 10.3 (L) 12/04/2021  ? HCT 32.0 (L) 12/04/2021  ? MCV 88.9  12/04/2021  ? PLT 406 (H) 12/04/2021  ?  ?Lab Results  ?Component Value Date  ? CREATININE 0.74 12/05/2021  ? BUN 5 (L) 12/05/2021  ? NA 129 (L) 12/05/2021  ? K 4.2 12/05/2021  ? CL 92 (L) 12/05/2021  ? CO2 29 12/05/2021  ?  ?Lab Results  ?Component Value Date  ? ALT 43 12/04/2021  ? AST 45 (H) 12/04/2021  ? GGT 141 (H) 11/23/2021  ? ALKPHOS 177 (H) 12/04/2021  ? BILITOT 1.0 12/04/2021  ?  ? ? ? ? ?Laurice Record, MD ?[facility] ?[facility] ?12/05/2021, 12:44 PM  ?

## 2021-12-05 NOTE — Progress Notes (Addendum)
1 Day Post-Op Procedure(s) (LRB): ?IRRIGATION AND DEBRIDMENT OF STERNOCLAVICULAR WOUND, WOUND VAC PLACMENT. (Left) ?Subjective: ?Some discomfort but overall feels better ? ?Objective: ?Vital signs in last 24 hours: ?Temp:  [97.5 ?F (36.4 ?C)-98.5 ?F (36.9 ?C)] 98.4 ?F (36.9 ?C) (04/18 0736) ?Pulse Rate:  [96-111] 107 (04/18 0736) ?Cardiac Rhythm: Sinus tachycardia (04/17 1900) ?Resp:  [0-21] 18 (04/18 0736) ?BP: (128-165)/(71-109) 157/109 (04/18 0736) ?SpO2:  [90 %-95 %] 94 % (04/18 0736) ?Weight:  [81.8 kg-87.2 kg] 81.8 kg (04/18 0431) ? ?Hemodynamic parameters for last 24 hours: ?  ? ?Intake/Output from previous day: ?04/17 0701 - 04/18 0700 ?In: 1494.6 [P.O.:240; I.V.:1254.6] ?Out: 4 [Drains:75; Stool:2] ?Intake/Output this shift: ?No intake/output data recorded. ? ?General appearance: alert, cooperative, and no distress ?Neurologic: intact ?Heart: regular rate and rhythm ?Lungs: clear to auscultation bilaterally ?Wound: VAC in place ? ?Lab Results: ?Recent Labs  ?  12/02/21 ?1040 12/04/21 ?2350  ?WBC 10.5 6.8  ?HGB 12.3* 10.3*  ?HCT 36.4* 32.0*  ?PLT 358 406*  ? ?BMET:  ?Recent Labs  ?  12/04/21 ?2350 12/05/21 ?0700  ?NA 129* 129*  ?K 4.2 4.2  ?CL 94* 92*  ?CO2 29 29  ?GLUCOSE 312* 244*  ?BUN 10 5*  ?CREATININE 0.79 0.74  ?CALCIUM 7.7* 7.8*  ?  ?PT/INR:  ?Recent Labs  ?  12/04/21 ?2350  ?LABPROT 17.8*  ?INR 1.5*  ? ?ABG ?No results found for: PHART, HCO3, TCO2, ACIDBASEDEF, O2SAT ?CBG (last 3)  ?Recent Labs  ?  12/04/21 ?1433 12/04/21 ?2134 12/05/21 ?0605  ?GLUCAP 134* 317* 240*  ? ? ?Assessment/Plan: ?S/P Procedure(s) (LRB): ?IRRIGATION AND DEBRIDMENT OF STERNOCLAVICULAR WOUND, WOUND VAC PLACMENT. (Left) ?Infected sternoclavicular joint- s/p debridement ?Looks good  ?Will need to go back to OR for VAc change, additional debridement later this week ?DVT- will start heparin drip ?Hyperglycemia- increase long acting insulin and meal coverage ?ID- on PCN G ?Will talk with Cardiology re: TEE ? LOS: 13 days   ? ? ?[name] ?12/05/2021 ? ? ?

## 2021-12-05 NOTE — Anesthesia Postprocedure Evaluation (Signed)
Anesthesia Post Note ? ?Patient: David Strong ? ?Procedure(s) Performed: IRRIGATION AND DEBRIDMENT OF STERNOCLAVICULAR WOUND, WOUND VAC PLACMENT. (Left: Chest) ? ?  ? ?Patient location during evaluation: PACU ?Anesthesia Type: General ?Level of consciousness: awake and alert ?Pain management: pain level controlled ?Vital Signs Assessment: post-procedure vital signs reviewed and stable ?Respiratory status: spontaneous breathing, nonlabored ventilation and respiratory function stable ?Cardiovascular status: stable and blood pressure returned to baseline ?Anesthetic complications: no ? ? ?No notable events documented. ? ?Last Vitals:  ?Vitals:  ? 12/05/21 0431 12/05/21 0736  ?BP:  (!) 157/109  ?Pulse:  (!) 107  ?Resp: 14 18  ?Temp:  36.9 ?C  ?SpO2:  94%  ?  ? ?  ?  ?  ?  ?  ?  ? ?Audry Pili ? ? ? ? ?

## 2021-12-05 NOTE — Progress Notes (Signed)
Occupational Therapy Treatment ?Patient Details ?Name: David Strong ?MRN: [id number] ?DOB: 1956/05/18 ?Today's Date: 12/05/2021 ? ? ?History of present illness Pt. is a 66 y.o. M presenting to [facility] 4/5 Admitted for severe sepsis, POA due to streptococcal pneumonia with bacteremia, left sternoclavicular joint septic arthritis and other large joint arthralgia/arthritis (right shoulder, left knee). Pt underwent I&D of [place] joint with wound vac placement on 4/17. PMH significant for T2DM.  CTA showed no PE, confirmed multifocal pneumonia and MRI shows left sternoclavicular joint inflammatory changes with scattered foci of air within the soft tissues and sternoclavicular joint space compatible with localized left sternoclavicular septic arthritis/cellulitis, without evidence of drainable fluid collection or large joint effusion.  Subsequent BCID and urine antigen positive for strep.  Antibiotics narrowed to IV ceftriaxone.  IR ultrasound shows +RUE superficial thrombosis and L LE DVT. ?  ?OT comments ? Session focused on reinforcement of HEP focusing on ROM of BUE targeting shoulders and cervical region to regain function. Pt reports only expected post op soreness and able to lift B UE to 90* today. Pt perseverating on R foot edema and difficult to engage in detailed HEP/session. DC recs remain appropriate.  ? ?Recommendations for follow up therapy are one component of a multi-disciplinary discharge planning process, led by the attending physician.  Recommendations may be updated based on patient status, additional functional criteria and insurance authorization. ?   ?Follow Up Recommendations ? Home health OT  ?  ?Assistance Recommended at Discharge Intermittent Supervision/Assistance  ?Patient can return home with the following ? A little help with bathing/dressing/bathroom;Assistance with cooking/housework;Help with stairs or ramp for entrance;Assist for transportation ?  ?Equipment Recommendations ? BSC/3in1  ?   ?Recommendations for Other Services   ? ?  ?Precautions / Restrictions Precautions ?Precautions: Fall ?Restrictions ?Weight Bearing Restrictions: No  ? ? ?  ? ?Mobility Bed Mobility ?  ?  ?  ?  ?  ?  ?  ?General bed mobility comments: up in chair on entry ?  ? ?Transfers ?  ?  ?  ?  ?  ?  ?  ?  ?  ?  ?  ?  ?Balance   ?  ?  ?  ?  ?  ?  ?  ?  ?  ?  ?  ?  ?  ?  ?  ?  ?  ?  ?   ? ?ADL either performed or assessed with clinical judgement  ? ?ADL Overall ADL's : Needs assistance/impaired ?  ?  ?  ?  ?  ?  ?  ?  ?  ?  ?  ?  ?  ?  ?  ?  ?  ?  ?  ?General ADL Comments: Perseverating on R foot swelling and reports no issues with UEs aside from post op soreness. Educated and encouraged ROM of R ankle, B UE, shoulder shrugs, neck ?  ? ?Extremity/Trunk Assessment Upper Extremity Assessment ?Upper Extremity Assessment: RUE deficits/detail;LUE deficits/detail ?RUE Deficits / Details: hand, wrist and elbow ROM WFL. able to lift UE to 90*, some soreness with abduction attempts. mild edema ?RUE Coordination: WNL ?LUE Deficits / Details: hand, wrist and elbow ROM WFL. able to lift UE to 90* before soreness (post op from wound vac felt and tightness with wound vac dressing) ?LUE Coordination: WNL ?  ?Lower Extremity Assessment ?Lower Extremity Assessment: Defer to PT evaluation ?  ?  ?  ? ?Vision   ?Vision Assessment?: No apparent visual deficits ?  ?  Perception   ?  ?Praxis   ?  ? ?Cognition Arousal/Alertness: Awake/alert ?Behavior During Therapy: [facility] for tasks assessed/performed ?Overall Cognitive Status: Within Functional Limits for tasks assessed ?  ?  ?  ?  ?  ?  ?  ?  ?  ?  ?  ?  ?  ?  ?  ?  ?General Comments: persevarating on R foot swelling and difficult to redirect ?  ?  ?   ?Exercises Exercises: Other exercises ?Other Exercises ?Other Exercises: Encouraged AROM of BUE, emphasizing shoulder flexion, abduction, shoulder shrugs, shoulder rolls, and cervical ROM ?Other Exercises: Encouraged ankle pumps and LE movement to improve  edema ? ?  ?Shoulder Instructions   ? ? ?  ?General Comments    ? ? ?Pertinent Vitals/ Pain       Pain Assessment ?Pain Assessment: Faces ?Faces Pain Scale: Hurts a little bit ?Pain Location: L shoulder with ROM ?Pain Descriptors / Indicators: Sore ?Pain Intervention(s): Monitored during session ? ?Home Living   ?  ?  ?  ?  ?  ?  ?  ?  ?  ?  ?  ?  ?  ?  ?  ?  ?  ?  ? ?  ?Prior Functioning/Environment    ?  ?  ?  ?   ? ?Frequency ? Min 2X/week  ? ? ? ? ?  ?Progress Toward Goals ? ?OT Goals(current goals can now be found in the care plan section) ? Progress towards OT goals: Progressing toward goals ? ?Acute Rehab OT Goals ?Patient Stated Goal: resolve R foot swelling ?OT Goal Formulation: With patient ?Time For Goal Achievement: 12/10/21 ?Potential to Achieve Goals: Good ?ADL Goals ?Pt Will Perform Upper Body Dressing: with modified independence ?Pt/caregiver will Perform Home Exercise Program: Right Upper extremity  ?Plan Discharge plan remains appropriate   ? ?Co-evaluation ? ? ?   ?  ?  ?  ?  ? ?  ?AM-PAC OT "6 Clicks" Daily Activity     ?Outcome Measure ? ? Help from another person eating meals?: None ?Help from another person taking care of personal grooming?: A Little ?Help from another person toileting, which includes using toliet, bedpan, or urinal?: A Little ?Help from another person bathing (including washing, rinsing, drying)?: A Little ?Help from another person to put on and taking off regular upper body clothing?: A Little ?Help from another person to put on and taking off regular lower body clothing?: A Little ?6 Click Score: 19 ? ?  ?End of Session   ? ?OT Visit Diagnosis: Pain ?Pain - Right/Left: Right ?Pain - part of body: Shoulder ?  ?Activity Tolerance Patient tolerated treatment well ?  ?Patient Left in chair;with call bell/phone within reach;Other (comment) (Md at bedside) ?  ?Nurse Communication Mobility status ?  ? ?   ? ?Time: 0955-1006 ?OT Time Calculation (min): 11 min ? ?Charges: OT General  Charges ?$OT Visit: 1 Visit ?OT Treatments ?$Therapeutic Activity: 8-22 mins ? ?Bradd Canary, OTR/L ?Acute Rehab Services ?Office: [phone]  ? ?Lorre Munroe ?12/05/2021, 11:09 AM ?

## 2021-12-05 NOTE — Progress Notes (Signed)
?PROGRESS NOTE ? ? ? ?David Strong  NFA:[id number] DOB: Jan 09, 1956 DOA: 11/22/2021 ?PCP: David Quint, MD  ? ?Brief Narrative:  ?[name]. ? ?Presented to the ED with complaints of productive cough, subjective fever, chills, myalgia, severe arthralgia, new onset erythema and swelling of the left side of the base of the neck.  CTA showed no PE, confirmed right middle lobe pneumonia and bibasilar airspace opacities consistent with multifocal pneumonia and left sternoclavicular joint inflammatory changes with scattered foci of air within the soft tissue and sternoclavicular joint space compatible with left localized sternoclavicular septic arthritis/cellulitis, without evidence of drainable fluid collection or large joint effusion.  Subsequent BC ID and urine antigen positive for strep.  Antibiotics were narrowed down to ceftriaxone.  Newly diagnosed right upper arm SVT and left lower extremity DVT, briefly on IV heparin transition to Eliquis. ?  ?MRI right shoulder findings suspicious for septic arthritis involving AC joint and glenohumeral joint.  Severe myositis and pyomyositis involving the subscapularis muscle, the anterior deltoid muscle and distal aspect of pectoralis muscle.  There is also myositis involving the infraspinatus muscle.  ? ? ?Subjective: ?4/18 afebrile overnight A/O x4, concerned about lower leg edema. ? ? ?Assessment & Plan: ? Covid vaccination; ? ?Principal Problem: ?  Severe sepsis (HCC) ?Active Problems: ?  Pneumococcal bacteremia ?  CAP (community acquired pneumonia) ?  Septic arthritis (HCC) ?  DVT (deep venous thrombosis) (HCC) ?  High anion gap metabolic acidosis ?  Transaminitis ?  Thrombocytopenia (HCC) ?  Type 2 diabetes mellitus (HCC) ?  Hypokalemia ?  Hypophosphatemia ?  Constipation ?  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  Bilateral leg edema ?  Obese ? ? ?Severe sepsis ?-Due to streptococcal multifocal  pneumonia/bacteremia ?-Patient presented with sepsis criteria including tachypnea, tachycardia, fever ?-CT chest confirmed multifocal pneumonia ?-Also found to have polyarticular septic arthritis, right shoulder septic arthritis with right shoulder septic joint, septic sternoclavicular joint with multiple areas of bio myositis on imaging of MRI ?-Patient empirically started on IV vancomycin, ceftriaxone and Zithromax ?-BC ID and urine was positive for Streptococcus ?-Antibiotics narrowed to IV ceftriaxone and then changed to penicillin G on 4/10 plan for 6 weeks of antibiotics till 01/03/2022 ?-ID has seen the patient and recommends 6 weeks of penicillin for endovascular coverage and outpatient TEE ?-TEE pending ?-ID will follow in 3 weeks ?  ?LEFT sternoclavicular joint septic arthritis ?-CTA chest was concerning for septic arthritis ?-ED provider spoke to CT surgeon on-call, who felt that case and imaging were consistent with septic arthritis of left sternoclavicular joint and recommended medical management and IV antibiotics without need for surgical intervention ?-Per ultrasound, there was minimal fluid in the left [place] joint ?-Started on antibiotics as above ?-4/17 s/p I&D LEFT sternoclavicular joint abscess see below ?-4/18 per cardiothoracic surgery will return to the OR this week for additional debridement. ?  ?Septic arthritis of right shoulder ?-MRI right shoulder was ordered as per ID recommendation ?-Which showed septic arthritis involving AC joint and glenohumeral joint-severe myositis and pyomyositis involving subscapularis muscle, anterior deltoid muscle and distal aspect of pectoralis muscle ?-There is also myositis involving the infraspinatus muscle ?-Orthopedics consulted, spoke with Dr. Magnus Ivan; ?-He saw patient and could not aspirate any fluid from the right shoulder  ?-Per Dr. Daune Perch note patient transferred to [facility] for definitive treatment.  CVTS was consulted, Dr. Dorris Fetch saw the  patient and recommended transfer to [facility] for  incision and drainage. ?- ?  ?Left ankle swelling/left knee swelling ?-MRI of left ankle was unremarkable for joint involvement only showed soft tissue inflammation; consistent with cellulitis ?-Left knee is swollen and warm to touch;  ?-Joint aspiration was attempted however no fluid obtained ?-Not consistent with septic arthritis ?  ?Community-acquired pneumonia ?-Started on antibiotics as above ?  ?Acute DVT LLE ?-Right upper extremity superficial vein thrombosis involving cephalic vein ?-Left lower extremity age indeterminate DVT left popliteal vein, acute DVT left gastrocnemius and left posterior tibial veins ?-Patient was briefly on IV heparin, transition to Eliquis ?-Due to complaint of bilateral leg pain and swelling, obtained RLE venous Dopplers which were negative for DVT or SVT ?-I have consulted oncology for further evaluation for this patient with multiple DVTs, hypoalbuminemia, transaminitis ?-We will check CT abdomen/pelvis for underlying metastatic process ?-4/18 continue heparin drip ?  ?Transaminitis ?-Likely in setting of severe sepsis ?-No GI symptoms ?-Hepatitis panel negative ?-Right upper quadrant ultrasound showed gallbladder sludge with no acute findings ?-LFTs have been elevated; unclear etiology ?-GI was consulted, at this time it was felt that LFTs are elevated due to sepsis ?-4/18 resolved except for alkaline phosphatase which is resolving ?  ?Anasarca ?-Patient has swelling of right upper extremity and both lower extremities ?-Albumin is down to 1.9 ?-Dietitian consulted for hypoalbuminemia ?-Started on Nuromax p.o. twice daily, MVI with minerals ?-4/18 patient counseled that swelling secondary to poor nutrition ?  ?Thrombocytopenia ?-Suspect chronic or acute on chronic ?-Platelet count in 2018 was 104 ?-Oncology was consulted.  Dr. Parke Poisson will follow patient in 3 months in clinic ?  ?DM type II uncontrolled not on long-term insulin ?-4/6  Hemoglobin A1c= 8.6 ?--Oral hypoglycemics on hold ?-CBG has been elevated ?-4/18 Increase Semglee t 40 units daily ?-4/18 increase NovoLog 8 units qac ?-4/18 decrease moderate SSI ? ?Essential HTN ?- Patient not on home medication: May be secondary to infection/pain ?- Hydralazine PRN ?  ?Hypokalemia ?- Potassium goal> 4 ?-Replete ?  ?Hyponatremia ?- Mild ?- 4/18 Normal Saline 75 ? ?Hypomagnesmia ?- Magnesium goal> 2 ?- 4/18 magnesium IV 3 g ?  ?Hypophosphatemia ?- Phosphorus goal> 2.5 ? ?Obese (BMI 33 kg/m?) ?-PCP to evaluate and treat ? ? ?Mobility Assessment (last 72 hours)   ? ? Mobility Assessment   ? ? Row Name 12/05/21 1627 12/05/21 1100 12/05/21 0800 12/04/21 1930 12/04/21 1813  ? Does patient have an order for bedrest or is patient medically unstable -- -- No - Continue assessment No - Continue assessment No - Continue assessment  ? What is the highest level of mobility based on the progressive mobility assessment? Level 4 (Walks with assist in room) - Balance while marching in place and cannot step forward and back - Complete Level 5 (Walks with assist in room/hall) - Balance while stepping forward/back and can walk in room with assist - Complete Level 5 (Walks with assist in room/hall) - Balance while stepping forward/back and can walk in room with assist - Complete Level 5 (Walks with assist in room/hall) - Balance while stepping forward/back and can walk in room with assist - Complete Level 5 (Walks with assist in room/hall) - Balance while stepping forward/back and can walk in room with assist - Complete  ? Is the above level different from baseline mobility prior to current illness? -- -- Yes - Recommend PT order Yes - Recommend PT order Yes - Recommend PT order  ? ? Row Name 12/04/21 [phone] 12/03/21 2036 12/03/21 0745 12/02/21 1920  ?  ?  Does patient have an order for bedrest or is patient medically unstable No - Continue assessment No - Continue assessment No - Continue assessment No - Continue  assessment   ? What is the highest level of mobility based on the progressive mobility assessment? Level 5 (Walks with assist in room/hall) - Balance while stepping forward/back and can walk in room with assist - Complete [place]

## 2021-12-06 DIAGNOSIS — R6520 Severe sepsis without septic shock: Secondary | ICD-10-CM | POA: Diagnosis not present

## 2021-12-06 DIAGNOSIS — J189 Pneumonia, unspecified organism: Secondary | ICD-10-CM | POA: Diagnosis not present

## 2021-12-06 DIAGNOSIS — A419 Sepsis, unspecified organism: Secondary | ICD-10-CM | POA: Diagnosis not present

## 2021-12-06 LAB — GLUCOSE, CAPILLARY
Glucose-Capillary: 240 mg/dL — ABNORMAL HIGH (ref 70–99)
Glucose-Capillary: 176 mg/dL — ABNORMAL HIGH (ref 70–99)
Glucose-Capillary: 236 mg/dL — ABNORMAL HIGH (ref 70–99)
Glucose-Capillary: 134 mg/dL — ABNORMAL HIGH (ref 70–99)

## 2021-12-06 LAB — COMPREHENSIVE METABOLIC PANEL
ALT: 33 U/L (ref 0–44)
AST: 26 U/L (ref 15–41)
Albumin: 1.5 g/dL — ABNORMAL LOW (ref 3.5–5.0)
Alkaline Phosphatase: 176 U/L — ABNORMAL HIGH (ref 38–126)
Anion gap: 6 (ref 5–15)
BUN: 8 mg/dL (ref 8–23)
CO2: 29 mmol/L (ref 22–32)
Calcium: 7.7 mg/dL — ABNORMAL LOW (ref 8.9–10.3)
Chloride: 96 mmol/L — ABNORMAL LOW (ref 98–111)
Creatinine, Ser: 0.64 mg/dL (ref 0.61–1.24)
GFR, Estimated: >60 mL/min (ref >60)
Glucose, Bld: 248 mg/dL — ABNORMAL HIGH (ref 70–99)
Potassium: 3.9 mmol/L (ref 3.5–5.1)
Sodium: 131 mmol/L — ABNORMAL LOW (ref 135–145)
Total Bilirubin: 1 mg/dL (ref 0.3–1.2)
Total Protein: 5.6 g/dL — ABNORMAL LOW (ref 6.5–8.1)

## 2021-12-06 LAB — CBC WITH DIFFERENTIAL/PLATELET
Abs Immature Granulocytes: 0.05 10*3/uL (ref 0.00–0.07)
Basophils Absolute: 0 10*3/uL (ref 0.0–0.1)
Basophils Relative: 0 %
Eosinophils Absolute: 0 10*3/uL (ref 0.0–0.5)
Eosinophils Relative: 1 %
HCT: 30.3 % — ABNORMAL LOW (ref 39.0–52.0)
Hemoglobin: 10 g/dL — ABNORMAL LOW (ref 13.0–17.0)
Immature Granulocytes: 1 %
Lymphocytes Relative: 20 %
Lymphs Abs: 1.2 10*3/uL (ref 0.7–4.0)
MCH: 29.7 pg (ref 26.0–34.0)
MCHC: 33 g/dL (ref 30.0–36.0)
MCV: 89.9 fL (ref 80.0–100.0)
Monocytes Absolute: 0.5 10*3/uL (ref 0.1–1.0)
Monocytes Relative: 8 %
Neutro Abs: 4.2 10*3/uL (ref 1.7–7.7)
Neutrophils Relative %: 70 %
Platelets: 344 10*3/uL (ref 150–400)
RBC: 3.37 MIL/uL — ABNORMAL LOW (ref 4.22–5.81)
RDW: 13.4 % (ref 11.5–15.5)
WBC: 6 10*3/uL (ref 4.0–10.5)
nRBC: 0 % (ref 0.0–0.2)

## 2021-12-06 LAB — HEPARIN LEVEL (UNFRACTIONATED)
Heparin Unfractionated: 0.32 IU/mL (ref 0.30–0.70)
Heparin Unfractionated: 0.17 IU/mL — ABNORMAL LOW (ref 0.30–0.70)
Heparin Unfractionated: <0.1 IU/mL — ABNORMAL LOW (ref 0.30–0.70)

## 2021-12-06 LAB — PHOSPHORUS: Phosphorus: 2.5 mg/dL (ref 2.5–4.6)

## 2021-12-06 LAB — MAGNESIUM: Magnesium: 1.8 mg/dL (ref 1.7–2.4)

## 2021-12-06 NOTE — TOC Progression Note (Signed)
Transition of Care (TOC) - Progression Note  ? ? ?Patient Details  ?Name: David Strong ?MRN: [id number] ?Date of Birth: June 12, 1956 ? ?Transition of Care (TOC) CM/SW Contact  ?Beckie Busing, RN ?Phone [phone] ? ?12/06/2021, 9:13 AM ? ?Clinical Narrative:    ?CM touched bases with Pam at [facility] for disposition plan for administering home IV abx. Per Pam we need a one day notice for discharge plans because patient is not teachable for antibiotic administration and wife is unable to come to the hospital for teaching which means patient will need to be seen the same day of discharge.  ? ? ?Expected Discharge Plan: Home/Self Care ?Barriers to Discharge: Barriers Resolved ? ?Expected Discharge Plan and Services ?Expected Discharge Plan: Home/Self Care ?  ?Discharge Planning Services: CM Consult ?Post Acute Care Choice: NA ?Living arrangements for the past 2 months: Single Family Home ?                ?  ?  ?  ?  ?  ?  ?  ?  ?  ?  ? ? ?Social Determinants of Health (SDOH) Interventions ?  ? ?Readmission Risk Interventions ?   ? View : No data to display.  ?  ?  ?  ? ? ?

## 2021-12-06 NOTE — Progress Notes (Addendum)
? ?   ?[address] ?      [place] [address] ?            [phone]   ? ?  2 Days Post-Op Procedure(s) (LRB): ?IRRIGATION AND DEBRIDMENT OF STERNOCLAVICULAR WOUND, WOUND VAC PLACMENT. (Left) ?Subjective: ?Feels pretty well, pain is minor ? ? ?Objective: ?Vital signs in last 24 hours: ?Temp:  [98.1 ?F (36.7 ?C)-98.7 ?F (37.1 ?C)] 98.4 ?F (36.9 ?C) (04/19 [id number]) ?Pulse Rate:  [91-100] 91 (04/19 0748) ?Cardiac Rhythm: Sinus tachycardia (04/18 1900) ?Resp:  [13-18] 16 (04/19 0748) ?BP: (123-175)/(69-119) 175/119 (04/19 0748) ?SpO2:  [93 %-97 %] 94 % (04/19 0748) ?Weight:  [82.2 kg] 82.2 kg (04/19 0423) ? ?Hemodynamic parameters for last 24 hours: ?  ? ?Intake/Output from previous day: ?04/18 0701 - 04/19 0700 ?In: 3314.2 [P.O.:1290; I.V.:1032.5; IV Piggyback:991.7] ?Out: 2101 [Urine:2050; Drains:50; Stool:1] ?Intake/Output this shift: ?Total I/O ?In: 240 [P.O.:240] ?Out: -  ? ?General appearance: alert, cooperative, and no distress ?Heart: regular rate and rhythm ?Lungs: clear to auscultation bilaterally ?Abdomen: benign ?Wound: VAC in place ? ?Lab Results: ?Recent Labs  ?  12/05/21 ?1303 12/06/21 ?0410  ?WBC 6.9 6.0  ?HGB 10.9* 10.0*  ?HCT 32.8* 30.3*  ?PLT 386 344  ? ?BMET:  ?Recent Labs  ?  12/05/21 ?1303 12/06/21 ?0410  ?NA 130* 131*  ?K 4.1 3.9  ?CL 94* 96*  ?CO2 29 29  ?GLUCOSE 328* 248*  ?BUN 9 8  ?CREATININE 0.60* 0.64  ?CALCIUM 7.9* 7.7*  ?  ?PT/INR:  ?Recent Labs  ?  12/04/21 ?2350  ?LABPROT 17.8*  ?INR 1.5*  ? ?ABG ?No results found for: PHART, HCO3, TCO2, ACIDBASEDEF, O2SAT ?CBG (last 3)  ?Recent Labs  ?  12/05/21 ?1617 12/05/21 ?2137 12/06/21 ?[id number]  ?GLUCAP 279* 187* 240*  ? ? ?Meds ?Scheduled Meds: ? acetaminophen  1,000 mg Oral Q6H  ? Or  ? acetaminophen (TYLENOL) oral liquid 160 mg/5 mL  1,000 mg Oral Q6H  ? bisacodyl  10 mg Oral Daily  ? Chlorhexidine Gluconate Cloth  6 each Topical Daily  ? insulin aspart  0-15 Units Subcutaneous TID WC  ? insulin aspart  0-5 Units Subcutaneous QHS   ? insulin aspart  8 Units Subcutaneous TID WC  ? insulin glargine-yfgn  40 Units Subcutaneous Daily  ? lidocaine  1 patch Transdermal Q24H  ? multivitamin with minerals  1 tablet Oral Daily  ? polyethylene glycol  17 g Oral BID  ? Ensure Max Protein  11 oz Oral BID  ? senna-docusate  1 tablet Oral QHS  ? senna-docusate  2 tablet Oral Daily  ? simethicone  80 mg Oral QID  ? ?Continuous Infusions: ? sodium chloride 75 mL/hr at 12/05/21 2319  ? heparin 1,250 Units/hr (12/06/21 0423)  ? penicillin g continuous IV infusion 12 Million Units (12/06/21 0631)  ? ?PRN Meds:.acetaminophen **OR** [DISCONTINUED] acetaminophen, alum & mag hydroxide-simeth, benzonatate, bisacodyl, fentaNYL (SUBLIMAZE) injection, hydrALAZINE, naLOXone (NARCAN)  injection, ondansetron (ZOFRAN) IV, oxyCODONE, oxyCODONE, sodium chloride flush ? ?Xrays ?DG Chest Port 1 View ? ?Result Date: 12/05/2021 ?CLINICAL DATA:  Chest pain with shortness of breath. History of irrigation and debridement of sternoclavicular wound and wound VAC placement yesterday. EXAM: PORTABLE CHEST 1 VIEW COMPARISON:  Radiographs 11/22/2021 and CT 12/03/2021. FINDINGS: 0629 hours. The heart size and mediastinal contours are stable. Left subclavian PICC appears unchanged from recent CT, protruding slightly into the azygous vein. New surgical drain overlies the medial aspect of the left clavicle, and  there is persistent left supraclavicular soft tissue swelling, slightly improved from recent CT. No progressive osseous erosion or acute osseous findings identified. There is stable mild atelectasis at the left lung base. No pleural effusion or pneumothorax. IMPRESSION: 1. Interval debridement of previously demonstrated abscess surrounding the left sternoclavicular joint with drain placement. 2. No acute cardiopulmonary process identified. Stable mild left basilar atelectasis. 3. Left arm PICC protrudes into the azygous vein, unchanged from CT of 2 days ago. Electronically Signed   By:  Richardean Sale M.D.   On: 12/05/2021 08:15   ? ? ?Recent Results (from the past 240 hour(s))  ?Aerobic/Anaerobic Culture w Gram Stain (surgical/deep wound)     Status: None (Preliminary result)  ? Collection Time: 12/04/21  2:41 PM  ? Specimen: PATH Other; Body Fluid  ?Result Value Ref Range Status  ? Specimen Description FLUID  Final  ? Special Requests LEFT sTERNOCLAVICULAR JOINT SPEC B  Final  ? Gram Stain   Final  ?  FEW WBC PRESENT,BOTH PMN AND MONONUCLEAR ?NO ORGANISMS SEEN ?  ? Culture   Final  ?  NO GROWTH < 24 HOURS ?Performed at [facility], [place] [address], [place], [place] [address] ?  ? Report Status PENDING  Incomplete  ?Aerobic/Anaerobic Culture w Gram Stain (surgical/deep wound)     Status: None (Preliminary result)  ? Collection Time: 12/04/21  2:43 PM  ? Specimen: PATH Other; Body Fluid  ?Result Value Ref Range Status  ? Specimen Description ABSCESS  Final  ? Special Requests LEFT STERNOCLAVICULAR JOINT SWAB SPEC A  Final  ? Gram Stain   Final  ?  FEW WBC PRESENT, PREDOMINANTLY PMN ?NO ORGANISMS SEEN ?  ? Culture   Final  ?  NO GROWTH < 24 HOURS ?Performed at [facility], [place] [address], [place], [place] [address] ?  ? Report Status PENDING  Incomplete  ?  ?Assessment/Plan: ?S/P Procedure(s) (LRB): ?IRRIGATION AND DEBRIDMENT OF STERNOCLAVICULAR WOUND, WOUND VAC PLACMENT. (Left) ?POD#2 ? ?1 afeb, VSS S BP 130's-170's ?2 sats good on RA ?3 good UOP ?4 VAC - 50 cc drainage ?5 normal renal fxn ?6 no leukocytosis ?7 H/h doen a little from previous ?8 BS control - fair ?9 med management per primary ?10 VAC change , debridement later this week ? LOS: 14 days  ? ? ?Gaspar Bidding pager [phone] ?12/06/2021 ?  ?Patient seen and examined, agree with above ?Plan VAC change under GA tomorrow in OR ? ?Revonda Standard Roxan Hockey, MD ?[facility] ?([phone] ? ?

## 2021-12-06 NOTE — Progress Notes (Addendum)
ANTICOAGULATION CONSULT NOTE - Follow Up Consult ? ?Pharmacy Consult for Heparin ?Indication: DVT ? ?Allergies  ?Allergen Reactions  ? Codeine Itching  ? Glipizide Er [Glipizide]   ?  SOB, malaise. He tolerates XL without issues.  ? Metformin And Related Rash  ? ? ?Patient Measurements: ?Height: 5\' 4"  (162.6 cm) ?Weight: 82.2 kg (181 lb 3.5 oz) ?IBW/kg (Calculated) : 59.2 ?Heparin Dosing Weight:  77 kg ? ?Vital Signs: ?Temp: 98.1 ?F (36.7 ?C) (04/19 0423) ?Temp Source: Oral (04/19 0423) ?BP: 157/88 (04/19 0423) ?Pulse Rate: 93 (04/19 0423) ? ?Labs: ?Recent Labs  ?  12/04/21 ?2350 12/05/21 ?0700 12/05/21 ?1303 12/05/21 ?2025 12/06/21 ?0410  ?HGB 10.3*  --  10.9*  --  10.0*  ?HCT 32.0*  --  32.8*  --  30.3*  ?PLT 406*  --  386  --  344  ?APTT 38*  --   --   --   --   ?LABPROT 17.8*  --   --   --   --   ?INR 1.5*  --   --   --   --   ?HEPARINUNFRC  --   --   --  0.46 0.32  ?CREATININE 0.79 0.74 0.60*  --  0.64  ? ? ? ?Estimated Creatinine Clearance: 89.1 mL/min (by C-G formula based on SCr of 0.64 mg/dL). ? ? ?Medical History: ?Past Medical History:  ?Diagnosis Date  ? Diabetes mellitus without complication (HCC)   ? ? ?Assessment: ?66 yo male with D-dimer 5.37.  CT chest negative for PE. Found to have new L DVT. Thrombocytopenia appears chronic. ? ?Patient was changed from heparin drip to appixaban 4/10.  Apixaban held for debridement 4/16 and heparin now ordered as bridge while apixaban on hold  ?No bolus and plans to return to OR for debridement and VAC change later this week.   ? ?Heparin level of 0.32 is therapeutic on heparin 1250 units/hr. Hgb 10. Plt wnl. No bleeding noted.  ? ?Goal of Therapy:  ?Heparin level 0.3-0.5  ?Monitor platelets by anticoagulation protocol: Yes ?  ?Plan:  ?Continue heparin 1250 units/hr  ?Check 6 hr heparin level to ensure remains in goal ?Monitor daily heparin level, CBC and s/s of bleeding  ?Follow up OR plans and ability to resume oral anticoagulation ? ?5/16, PharmD,  BCPS ?Clinical Pharmacist ?12/06/2021 7:10 AM ? ?Addendum: Repeat heparin level of 0.17 is subtherapeutic. Possibly due to previous apixaban in system clearing. No issues with IV access or infusion per RN. No bleeding noted per RN.  ? ?Plan: ?- Increase heparin to 1400 units/hr  ?- Check 6 hr heparin level  ? ?12/08/2021, PharmD, BCPS ?Clinical Pharmacist ?12/06/2021 12:15 PM ? ? ? ? ? ?

## 2021-12-06 NOTE — H&P (View-Only) (Signed)
? ? ? [facility] ?                                                                            ? ? ?David Strong, is a 65 y.o. male, DOB - 08/11/1956, MRN:[id number] ?Admit date - 11/22/2021    ?Outpatient Primary MD for the patient is Sagardia, [name], MD ? ?LOS - 14  days ? ? ? ?Brief summary  ? ?65-year-old male with medical history of diabetes mellitus type 2 presented to the ED with complaints of productive cough, subjective fever, chills, myalgia, severe arthralgia, new onset erythema and swelling of the left side of the base of the neck.  CTA showed no PE, confirmed right middle lobe pneumonia and bibasilar airspace opacities consistent with multifocal pneumonia and left sternoclavicular joint inflammatory changes with scattered foci of air within the soft tissue and sternoclavicular joint space compatible with left localized sternoclavicular septic arthritis/cellulitis, without evidence of drainable fluid collection or large joint effusion.  Subsequent BC ID and urine antigen positive for strep.  Antibiotics were narrowed down to ceftriaxone.  Newly diagnosed right upper arm SVT and left lower extremity DVT, briefly on IV heparin transition to Eliquis . ?MRI right shoulder findings suspicious for septic arthritis involving AC joint and glenohumeral joint.  Severe myositis and pyomyositis involving the subscapularis muscle, the anterior deltoid muscle and distal aspect of pectoralis muscle.  There is also myositis involving the infraspinatus muscle.   ?  ?Patient was seen by orthopedic surgery, no fluid could be aspirated on joint aspiration from the right shoulder.  He was seen by infectious disease and TEE was recommended, however patient did not want to get inpatient TEE so outpatient TEE was scheduled.  ID recommended to discharge home on 6 weeks of penicillin.  Home health RN/PT was arranged for IV antibiotics.  Eliquis also was sent to the pharmacy. ?  ?Before discharge patient complained of  worsening swelling of right chest wall, crepitus was palpated with likely subluxation of left clavicle at the sternoclavicular joint.  CT chest with contrast was obtained which showed progressive anterior subluxation of the left sternoclavicular joint with new erosive changes with bony fragmentation along the medial articular surface of the proximal left clavicle.  Findings on 7 4 septic arthritis.  There was progressive fluid and less collection centered on the left supraclavicular joint which extended to the left side of the neck.  Also Vollman to the left pectoralis muscle containing foci of gas and fluid.  Findings consistent with abscess. ?  ?CVTS was consulted, Dr. Hendrickson saw the patient and recommended transfer to [place] for incision and drainage. ?  ?  ? ? ?Assessment & Plan  ? ? ?Assessment and Plan: ?* Severe sepsis (HCC) ?Due to streptococcal multifocal pneumonia with bacteremia, presumed left sternoclavicular joint septic arthritis left supraclavicular abscess and other polyarthralgia/arthritis (right shoulder, left knee) ?Met sepsis criteria on admission including persistent tachypnea, tachycardia and did spike a fever of 100.6 overnight of admission.  CTA chest confirmed multifocal pneumonia.   ?Empirically started on IV vancomycin, ceftriaxone and azithromycin. ?BCID and urine positive for Streptococcus.  ID consulted and his antibiotics narrowed to  penicillin G on 4/10 plan   for 6 weeks of antibiotics till 01/03/2022 ?TTE: LVEF 60-65%.  No evidence of valvular vegetations.  Surveillance blood cultures 4/7: NTD ?Outpatient TEE. Outpatient follow up with ID.  ?Sepsis physiology has resolved but still with significant arthralgias/arthritis of above joints.   ?Pt was transferred to [facility] per cardiothoracic surgery request and he underwent irrigation and debridement of the left sternoclavicular joint abscess on 12/04/21.  ? ?Septic arthritis (HCC) ?Left sternoclavicular joint septic arthritis ?Has  other polyarthralgias/arthritis (right shoulder and left knee) ?See above. Left ankle swelling and left knee swelling with some cellulitis changes. Joint aspiration was attempted however no fluid obtained.   ? ? ?DVT (deep venous thrombosis) (HCC) ? ?Left lower extremity: Age-indeterminate DVT left popliteal vein, acute DVT left gastrocnemius and left posterior tibial veins. ?Briefly on IV heparin, transitioned to Eliquis. ?Due to complaint of bilateral leg pain and swelling, obtained RLE venous Dopplers which were negative for DVT or SVT. ?Hematology consulted for further evaluation in view of hypoalbuminemia, DVT.  ? ?Transaminitis ?Mild and likely related to severe sepsis. ?No GI symptoms. ?RUQ ultrasound with gallbladder sludge and no acute findings. ?Continue to trend daily CMP. ? ?High anion gap metabolic acidosis ?Lactic acidosis ?Secondary to severe sepsis. ?Bicarbonate normal.  Anion gap has normalized. ?Resolved. ? ?Thrombocytopenia (HCC) ?Suspect chronic or acute on chronic ?Platelet counts in 2018: 104 ?Resolved. ? ?Type 2 diabetes mellitus (HCC) ?A1c 8 on 10/12/2021. ?On glipizide and dapagliflozin at home.  Currently held. ?CBGs uncontrolled and fluctuating.   ?CBG (last 3)  ?Recent Labs  ?  12/05/21 ?2137 12/06/21 ?0607 12/06/21 ?1108  ?GLUCAP 187* 240* 176*  ? ?Resume Semglee and SSI.  ? ?Hypokalemia ?Replaced.  Magnesium 2.8. ? ?Hypophosphatemia ?Replaced. ? ?Bilateral leg edema ?Likely due to IV fluids early on in admission and hypoalbuminemic state. ?Also has left lower extremity DVT. ? ?Constipation ?Aggressive bowel regimen. ?BMs documented. ? ? ? ?Hypertension:  ?Suboptimal. Will add prn hydralazine.  ?  ? ? ?RN Pressure Injury Documentation: ?  ? ?Malnutrition Type: ? ?Nutrition Problem: Increased nutrient needs ?Etiology: acute illness ? ? ?Malnutrition Characteristics: ? ?Signs/Symptoms: estimated needs ? ? ?Nutrition Interventions: ? ?Interventions: Premier Protein ? ?Estimated body mass  index is 31.11 kg/m? as calculated from the following: ?  Height as of this encounter: 5' 4" (1.626 m). ?  Weight as of this encounter: 82.2 kg. ? ?Code Status: Full code.  ?DVT Prophylaxis:  SCD's Start: 12/04/21 1836 ?SCDs Start: 11/22/21 1920 ? ? ?Level of Care: Level of care: Progressive ?Family Communication: None at bedside.  ? ?Disposition Plan:     Remains inpatient appropriate:  INCISION AND DEBRIDEMENT ? ?Procedures:  ?IRRIGATION AND DEBRIDMENT OF STERNOCLAVICULAR WOUND, WOUND VAC PLACEMENT on the left.. ?Consultants:   ?Cardiothoracic surgery.  ? ?Antimicrobials:  ? ?Anti-infectives (From admission, onward)  ? ? Start     Dose/Rate Route Frequency Ordered Stop  ? 12/04/21 1930  penicillin G potassium 12 Million Units in dextrose 5 % 500 mL continuous infusion  Status:  Discontinued       ? 12 Million Units ?41.7 mL/hr over 12 Hours Intravenous Every 12 hours 12/04/21 1835 12/04/21 1928  ? 12/04/21 1000  vancomycin (VANCOCIN) IVPB 1000 mg/200 mL premix       ? 1,000 mg ?200 mL/hr over 60 Minutes Intravenous On call to O.R. 12/04/21 0958 12/04/21 1443  ? 12/01/21 0000  penicillin G IVPB       ? 24 Million Units Intravenous Every 24 hours 12/01/21 1303 01/03/22   2359  ? 11/27/21 1000  penicillin G potassium 12 Million Units in dextrose 5 % 500 mL continuous infusion       ? 12 Million Units ?41.7 mL/hr over 12 Hours Intravenous Every 12 hours 11/27/21 0901    ? 11/23/21 2000  vancomycin (VANCOREADY) IVPB 1750 mg/350 mL  Status:  Discontinued       ? 1,750 mg ?175 mL/hr over 120 Minutes Intravenous Every 24 hours 11/22/21 1943 11/23/21 0931  ? 11/22/21 2300  cefTRIAXone (ROCEPHIN) 2 g in sodium chloride 0.9 % 100 mL IVPB  Status:  Discontinued       ? 2 g ?200 mL/hr over 30 Minutes Intravenous Every 24 hours 11/22/21 1923 11/27/21 0901  ? 11/22/21 2000  azithromycin (ZITHROMAX) 500 mg in sodium chloride 0.9 % 250 mL IVPB  Status:  Discontinued       ? 500 mg ?250 mL/hr over 60 Minutes Intravenous Every 24  hours 11/22/21 1923 11/23/21 0931  ? 11/22/21 1830  ceFEPIme (MAXIPIME) 2 g in sodium chloride 0.9 % 100 mL IVPB       ? 2 g ?200 mL/hr over 30 Minutes Intravenous  Once 11/22/21 1825 11/22/21 1949  ? 11/22/21

## 2021-12-06 NOTE — Progress Notes (Signed)
Inpatient Diabetes Program Recommendations ? ?AACE/ADA: New Consensus Statement on Inpatient Glycemic Control (2015) ? ?Target Ranges:  Prepandial:   less than 140 mg/dL ?     Peak postprandial:   less than 180 mg/dL (1-2 hours) ?     Critically ill patients:  140 - 180 mg/dL  ? ?Lab Results  ?Component Value Date  ? GLUCAP 176 (H) 12/06/2021  ? HGBA1C 8.6 (H) 11/23/2021  ? ? ?Review of Glycemic Control ? Latest Reference Range & Units 12/05/21 06:05 12/05/21 11:21 12/05/21 16:17 12/05/21 21:37 12/06/21 06:07 12/06/21 11:08  ?Glucose-Capillary 70 - 99 mg/dL 951 (H) 884 (H) 166 (H) 187 (H) 240 (H) 176 (H)  ?(H): Data is abnormally high ? ?Diabetes history: DM2 ?Outpatient Diabetes medications: Farxiga 10 mg QD and Glipizide 10 BID ?Current orders for Inpatient glycemic control: Semglee 40 units QD, Novolog 8 units TID and 0-15 units TID and 0-5 units QHS ? ?Inpatient Diabetes Program Recommendations:   ? ?Semglee 45 units QD ? ?Will continue to follow while inpatient. ? ?Thank you, ?Dulce Sellar, MSN, RN ?Diabetes Coordinator ?Inpatient Diabetes Program ?[phone] (team pager from 8a-5p) ? ? ? ?

## 2021-12-06 NOTE — Progress Notes (Signed)
ANTICOAGULATION CONSULT NOTE - Follow Up Consult ? ?Pharmacy Consult for Heparin ?Indication: DVT ? ?Allergies  ?Allergen Reactions  ? Codeine Itching  ? Glipizide Er [Glipizide]   ?  SOB, malaise. He tolerates XL without issues.  ? Metformin And Related Rash  ? ? ?Patient Measurements: ?Height: 5\' 4"  (162.6 cm) ?Weight: 82.2 kg (181 lb 3.5 oz) ?IBW/kg (Calculated) : 59.2 ?Heparin Dosing Weight:  77 kg ? ?Vital Signs: ?Temp: 97.8 ?F (36.6 ?C) (04/19 1609) ?Temp Source: Oral (04/19 1609) ?BP: 132/85 (04/19 1815) ?Pulse Rate: 108 (04/19 1815) ? ?Labs: ?Recent Labs  ?  12/04/21 ?2350 12/05/21 ?0700 12/05/21 ?1303 12/05/21 ?2025 12/06/21 ?0410 12/06/21 ?1045 12/06/21 ?1822  ?HGB 10.3*  --  10.9*  --  10.0*  --   --   ?HCT 32.0*  --  32.8*  --  30.3*  --   --   ?PLT 406*  --  386  --  344  --   --   ?APTT 38*  --   --   --   --   --   --   ?LABPROT 17.8*  --   --   --   --   --   --   ?INR 1.5*  --   --   --   --   --   --   ?HEPARINUNFRC  --   --   --    < > 0.32 0.17* <0.10*  ?CREATININE 0.79 0.74 0.60*  --  0.64  --   --   ? < > = values in this interval not displayed.  ? ? ? ?Estimated Creatinine Clearance: 89.1 mL/min (by C-G formula based on SCr of 0.64 mg/dL). ? ? ?Medical History: ?Past Medical History:  ?Diagnosis Date  ? Diabetes mellitus without complication (HCC)   ? ? ?Assessment: ?66 yo male with D-dimer 5.37.  CT chest negative for PE. Found to have new L DVT. Thrombocytopenia appears chronic. ? ?Patient was changed from heparin drip to appixaban 4/10.  Apixaban held for debridement 4/16 and heparin now ordered as bridge while apixaban on hold  ?No bolus and plans to return to OR for debridement and VAC change later this week.   ? ?Heparin level is now undetectable.  I suspect we were seeing some residual effect of apixaban on previous heparin levels that has now worn off.  No overt bleeding or complications noted. ? ?Goal of Therapy:  ?Heparin level 0.3-0.5  ?Monitor platelets by anticoagulation protocol:  Yes ?  ?Plan:  ?Increase IV heparin to 1600 units/hr. ?Repeat heparin level in 8 hrs. ?Monitor daily heparin level, CBC and s/s of bleeding  ?Follow up OR plans and ability to resume oral anticoagulation ? ?5/16, Pharm D, BCPS, BCCP ?Clinical Pharmacist ? 12/06/2021 8:06 PM  ? ?[facility] pharmacy phone numbers are listed on [email or web address] ? ? ? ? ? ? ?

## 2021-12-06 NOTE — Progress Notes (Signed)
? ? ? [facility] ?                                                                            ? ? ?David Strong, is a 66 y.o. male, DOB - Jun 23, 1956, CBJ:[id number] ?Admit date - 11/22/2021    ?Outpatient Primary MD for the patient is David Honour Ines Bloomer, MD ? ?LOS - 14  days ? ? ? ?Brief summary  ? ?66 year old male with medical history of diabetes mellitus type 2 presented to the ED with complaints of productive cough, subjective fever, chills, myalgia, severe arthralgia, new onset erythema and swelling of the left side of the base of the neck.  CTA showed no PE, confirmed right middle lobe pneumonia and bibasilar airspace opacities consistent with multifocal pneumonia and left sternoclavicular joint inflammatory changes with scattered foci of air within the soft tissue and sternoclavicular joint space compatible with left localized sternoclavicular septic arthritis/cellulitis, without evidence of drainable fluid collection or large joint effusion.  Subsequent BC ID and urine antigen positive for strep.  Antibiotics were narrowed down to ceftriaxone.  Newly diagnosed right upper arm SVT and left lower extremity DVT, briefly on IV heparin transition to Eliquis . ?MRI right shoulder findings suspicious for septic arthritis involving AC joint and glenohumeral joint.  Severe myositis and pyomyositis involving the subscapularis muscle, the anterior deltoid muscle and distal aspect of pectoralis muscle.  There is also myositis involving the infraspinatus muscle.   ?  ?Patient was seen by orthopedic surgery, no fluid could be aspirated on joint aspiration from the right shoulder.  He was seen by infectious disease and TEE was recommended, however patient did not want to get inpatient TEE so outpatient TEE was scheduled.  ID recommended to discharge home on 6 weeks of penicillin.  Home health RN/PT was arranged for IV antibiotics.  Eliquis also was sent to the pharmacy. ?  ?Before discharge patient complained of  worsening swelling of right chest wall, crepitus was palpated with likely subluxation of left clavicle at the sternoclavicular joint.  CT chest with contrast was obtained which showed progressive anterior subluxation of the left sternoclavicular joint with new erosive changes with bony fragmentation along the medial articular surface of the proximal left clavicle.  Findings on 7 4 septic arthritis.  There was progressive fluid and less collection centered on the left supraclavicular joint which extended to the left side of the neck.  Also Vollman to the left pectoralis muscle containing foci of gas and fluid.  Findings consistent with abscess. ?  ?CVTS was consulted, Dr. Roxan Hockey saw the patient and recommended transfer to [facility] for incision and drainage. ?  ?  ? ? ?Assessment & Plan  ? ? ?Assessment and Plan: ?* Severe sepsis ([place]) ?Due to streptococcal multifocal pneumonia with bacteremia, presumed left sternoclavicular joint septic arthritis left supraclavicular abscess and other polyarthralgia/arthritis (right shoulder, left knee) ?Met sepsis criteria on admission including persistent tachypnea, tachycardia and did spike a fever of 100.6 overnight of admission.  CTA chest confirmed multifocal pneumonia.   ?Empirically started on IV vancomycin, ceftriaxone and azithromycin. ?BCID and urine positive for Streptococcus.  ID consulted and his antibiotics narrowed to  penicillin G on 4/10 plan  for 6 weeks of antibiotics till 01/03/2022 ?TTE: LVEF 60-65%.  No evidence of valvular vegetations.  Surveillance blood cultures 4/7: NTD ?Outpatient TEE. Outpatient follow up with ID.  ?Sepsis physiology has resolved but still with significant arthralgias/arthritis of above joints.   ?Pt was transferred to [facility] per cardiothoracic surgery request and he underwent irrigation and debridement of the left sternoclavicular joint abscess on 12/04/21.  ? ?Septic arthritis ([place]) ?Left sternoclavicular joint septic arthritis ?Has  other polyarthralgias/arthritis (right shoulder and left knee) ?See above. Left ankle swelling and left knee swelling with some cellulitis changes. Joint aspiration was attempted however no fluid obtained.   ? ? ?DVT (deep venous thrombosis) ([place]) ? ?Left lower extremity: Age-indeterminate DVT left popliteal vein, acute DVT left gastrocnemius and left posterior tibial veins. ?Briefly on IV heparin, transitioned to Eliquis. ?Due to complaint of bilateral leg pain and swelling, obtained RLE venous Dopplers which were negative for DVT or SVT. ?Hematology consulted for further evaluation in view of hypoalbuminemia, DVT.  ? ?Transaminitis ?Mild and likely related to severe sepsis. ?No GI symptoms. ?RUQ ultrasound with gallbladder sludge and no acute findings. ?Continue to trend daily CMP. ? ?High anion gap metabolic acidosis ?Lactic acidosis ?Secondary to severe sepsis. ?Bicarbonate normal.  Anion gap has normalized. ?Resolved. ? ?Thrombocytopenia ([place]) ?Suspect chronic or acute on chronic ?Platelet counts in 2018: 104 ?Resolved. ? ?Type 2 diabetes mellitus ([place]) ?A1c 8 on 10/12/2021. ?On glipizide and dapagliflozin at home.  Currently held. ?CBGs uncontrolled and fluctuating.   ?CBG (last 3)  ?Recent Labs  ?  12/05/21 ?2137 12/06/21 ?9449 12/06/21 ?1108  ?GLUCAP 187* 240* 176*  ? ?Resume Semglee and SSI.  ? ?Hypokalemia ?Replaced.  Magnesium 2.8. ? ?Hypophosphatemia ?Replaced. ? ?Bilateral leg edema ?Likely due to IV fluids early on in admission and hypoalbuminemic state. ?Also has left lower extremity DVT. ? ?Constipation ?Aggressive bowel regimen. ?BMs documented. ? ? ? ?Hypertension:  ?Suboptimal. Will add prn hydralazine.  ?  ? ? ?RN Pressure Injury Documentation: ?  ? ?Malnutrition Type: ? ?Nutrition Problem: Increased nutrient needs ?Etiology: acute illness ? ? ?Malnutrition Characteristics: ? ?Signs/Symptoms: estimated needs ? ? ?Nutrition Interventions: ? ?Interventions: Premier Protein ? ?Estimated body mass  index is 31.11 kg/m? as calculated from the following: ?  Height as of this encounter: _0  (1.626 m). ?  Weight as of this encounter: 82.2 kg. ? ?Code Status: Full code.  ?DVT Prophylaxis:  SCD's Start: 12/04/21 1836 ?SCDs Start: 11/22/21 1920 ? ? ?Level of Care: Level of care: Progressive ?Family Communication: None at bedside.  ? ?Disposition Plan:     Remains inpatient appropriate:  INCISION AND DEBRIDEMENT ? ?Procedures:  ?IRRIGATION AND DEBRIDMENT OF STERNOCLAVICULAR WOUND, WOUND VAC PLACEMENT on the left.Marland Kitchen ?Consultants:   ?Cardiothoracic surgery.  ? ?Antimicrobials:  ? ?Anti-infectives (From admission, onward)  ? ? Start     Dose/Rate Route Frequency Ordered Stop  ? 12/04/21 1930  penicillin G potassium 12 Million Units in dextrose 5 % 500 mL continuous infusion  Status:  Discontinued       ? 12 Million Units ?41.7 mL/hr over 12 Hours Intravenous Every 12 hours 12/04/21 1835 12/04/21 1928  ? 12/04/21 1000  vancomycin (VANCOCIN) IVPB 1000 mg/200 mL premix       ? 1,000 mg ?200 mL/hr over 60 Minutes Intravenous On call to O.R. 12/04/21 0958 12/04/21 1443  ? 12/01/21 0000  penicillin G IVPB       ? 24 Million Units Intravenous Every 24 hours 12/01/21 1303 01/03/22  2359  ? 11/27/21 1000  penicillin G potassium 12 Million Units in dextrose 5 % 500 mL continuous infusion       ? 12 Million Units ?41.7 mL/hr over 12 Hours Intravenous Every 12 hours 11/27/21 0901    ? 11/23/21 2000  vancomycin (VANCOREADY) IVPB 1750 mg/350 mL  Status:  Discontinued       ? 1,750 mg ?175 mL/hr over 120 Minutes Intravenous Every 24 hours 11/22/21 1943 11/23/21 0931  ? 11/22/21 2300  cefTRIAXone (ROCEPHIN) 2 g in sodium chloride 0.9 % 100 mL IVPB  Status:  Discontinued       ? 2 g ?200 mL/hr over 30 Minutes Intravenous Every 24 hours 11/22/21 1923 11/27/21 0901  ? 11/22/21 2000  azithromycin (ZITHROMAX) 500 mg in sodium chloride 0.9 % 250 mL IVPB  Status:  Discontinued       ? 500 mg ?250 mL/hr over 60 Minutes Intravenous Every 24  hours 11/22/21 1923 11/23/21 0931  ? 11/22/21 1830  ceFEPIme (MAXIPIME) 2 g in sodium chloride 0.9 % 100 mL IVPB       ? 2 g ?200 mL/hr over 30 Minutes Intravenous  Once 11/22/21 1825 11/22/21 1949  ? 11/22/21

## 2021-12-07 ENCOUNTER — Inpatient Hospital Stay (HOSPITAL_COMMUNITY): Payer: Medicare Other | Admitting: Certified Registered"

## 2021-12-07 ENCOUNTER — Encounter (HOSPITAL_COMMUNITY)
Admission: EM | Disposition: A | Discharge status: Home Health | Payer: Self-pay | Source: Ambulatory Visit | Attending: Internal Medicine

## 2021-12-07 DIAGNOSIS — J189 Pneumonia, unspecified organism: Secondary | ICD-10-CM | POA: Diagnosis not present

## 2021-12-07 DIAGNOSIS — M86112 Other acute osteomyelitis, left shoulder: Secondary | ICD-10-CM | POA: Diagnosis not present

## 2021-12-07 DIAGNOSIS — R6520 Severe sepsis without septic shock: Secondary | ICD-10-CM | POA: Diagnosis not present

## 2021-12-07 DIAGNOSIS — E119 Type 2 diabetes mellitus without complications: Secondary | ICD-10-CM

## 2021-12-07 DIAGNOSIS — D649 Anemia, unspecified: Secondary | ICD-10-CM

## 2021-12-07 DIAGNOSIS — A419 Sepsis, unspecified organism: Secondary | ICD-10-CM | POA: Diagnosis not present

## 2021-12-07 DIAGNOSIS — M009 Pyogenic arthritis, unspecified: Secondary | ICD-10-CM

## 2021-12-07 DIAGNOSIS — Z7984 Long term (current) use of oral hypoglycemic drugs: Secondary | ICD-10-CM

## 2021-12-07 HISTORY — PX: IRRIGATION AND DEBRIDEMENT ABSCESS: SHX5252

## 2021-12-07 HISTORY — PX: APPLICATION OF WOUND VAC: SHX5189

## 2021-12-07 LAB — HEPARIN LEVEL (UNFRACTIONATED): Heparin Unfractionated: 0.36 IU/mL (ref 0.30–0.70)

## 2021-12-07 LAB — CBC WITH DIFFERENTIAL/PLATELET
Abs Immature Granulocytes: 0.05 10*3/uL (ref 0.00–0.07)
Basophils Absolute: 0 10*3/uL (ref 0.0–0.1)
Basophils Relative: 0 %
Eosinophils Absolute: 0.1 10*3/uL (ref 0.0–0.5)
Eosinophils Relative: 2 %
HCT: 28.9 % — ABNORMAL LOW (ref 39.0–52.0)
Hemoglobin: 9.3 g/dL — ABNORMAL LOW (ref 13.0–17.0)
Immature Granulocytes: 1 %
Lymphocytes Relative: 28 %
Lymphs Abs: 1.4 10*3/uL (ref 0.7–4.0)
MCH: 29.2 pg (ref 26.0–34.0)
MCHC: 32.2 g/dL (ref 30.0–36.0)
MCV: 90.6 fL (ref 80.0–100.0)
Monocytes Absolute: 0.5 10*3/uL (ref 0.1–1.0)
Monocytes Relative: 10 %
Neutro Abs: 3 10*3/uL (ref 1.7–7.7)
Neutrophils Relative %: 59 %
Platelets: 337 10*3/uL (ref 150–400)
RBC: 3.19 MIL/uL — ABNORMAL LOW (ref 4.22–5.81)
RDW: 13.6 % (ref 11.5–15.5)
WBC: 5.1 10*3/uL (ref 4.0–10.5)
nRBC: 0 % (ref 0.0–0.2)

## 2021-12-07 LAB — COMPREHENSIVE METABOLIC PANEL
ALT: 28 U/L (ref 0–44)
AST: 29 U/L (ref 15–41)
Albumin: <1.5 g/dL — ABNORMAL LOW (ref 3.5–5.0)
Alkaline Phosphatase: 166 U/L — ABNORMAL HIGH (ref 38–126)
Anion gap: 7 (ref 5–15)
BUN: <5 mg/dL — ABNORMAL LOW (ref 8–23)
CO2: 26 mmol/L (ref 22–32)
Calcium: 7.7 mg/dL — ABNORMAL LOW (ref 8.9–10.3)
Chloride: 99 mmol/L (ref 98–111)
Creatinine, Ser: 0.63 mg/dL (ref 0.61–1.24)
GFR, Estimated: >60 mL/min (ref >60)
Glucose, Bld: 160 mg/dL — ABNORMAL HIGH (ref 70–99)
Potassium: 3.5 mmol/L (ref 3.5–5.1)
Sodium: 132 mmol/L — ABNORMAL LOW (ref 135–145)
Total Bilirubin: 0.6 mg/dL (ref 0.3–1.2)
Total Protein: 5.3 g/dL — ABNORMAL LOW (ref 6.5–8.1)

## 2021-12-07 LAB — GLUCOSE, CAPILLARY
Glucose-Capillary: 141 mg/dL — ABNORMAL HIGH (ref 70–99)
Glucose-Capillary: 159 mg/dL — ABNORMAL HIGH (ref 70–99)
Glucose-Capillary: 151 mg/dL — ABNORMAL HIGH (ref 70–99)
Glucose-Capillary: 133 mg/dL — ABNORMAL HIGH (ref 70–99)
Glucose-Capillary: 167 mg/dL — ABNORMAL HIGH (ref 70–99)

## 2021-12-07 LAB — MAGNESIUM: Magnesium: 1.6 mg/dL — ABNORMAL LOW (ref 1.7–2.4)

## 2021-12-07 LAB — CYTOLOGY - NON PAP

## 2021-12-07 LAB — PHOSPHORUS: Phosphorus: 2.7 mg/dL (ref 2.5–4.6)

## 2021-12-07 SURGERY — IRRIGATION AND DEBRIDEMENT ABSCESS
Anesthesia: General | Site: Chest | Laterality: Left

## 2021-12-07 MED ORDER — FUROSEMIDE 10 MG/ML IJ SOLN
20.0000 mg | Freq: Once | INTRAMUSCULAR | Status: AC
  Administered 2021-12-07: 20 mg via INTRAVENOUS
  Filled 2021-12-07: qty 2

## 2021-12-07 MED ORDER — LACTATED RINGERS IV SOLN: INTRAVENOUS | Status: DC

## 2021-12-07 MED ORDER — PHENYLEPHRINE 80 MCG/ML (10ML) SYRINGE FOR IV PUSH (FOR BLOOD PRESSURE SUPPORT)
PREFILLED_SYRINGE | INTRAVENOUS | Status: AC
  Filled 2021-12-07: qty 10

## 2021-12-07 MED ORDER — LIDOCAINE 2% (20 MG/ML) 5 ML SYRINGE
INTRAMUSCULAR | Status: AC
  Filled 2021-12-07: qty 5

## 2021-12-07 MED ORDER — FENTANYL CITRATE (PF) 250 MCG/5ML IJ SOLN
INTRAMUSCULAR | Status: AC
  Filled 2021-12-07: qty 5

## 2021-12-07 MED ORDER — PROPOFOL 10 MG/ML IV BOLUS
INTRAVENOUS | Status: AC
  Filled 2021-12-07: qty 20

## 2021-12-07 MED ORDER — ORAL CARE MOUTH RINSE: 15.0000 mL | Freq: Once | OROMUCOSAL | Status: DC

## 2021-12-07 MED ORDER — ROCURONIUM BROMIDE 10 MG/ML (PF) SYRINGE
PREFILLED_SYRINGE | INTRAVENOUS | Status: AC
  Filled 2021-12-07: qty 10

## 2021-12-07 MED ORDER — 0.9 % SODIUM CHLORIDE (POUR BTL) OPTIME
TOPICAL | Status: DC | PRN
  Administered 2021-12-07: 1000 mL

## 2021-12-07 MED ORDER — AMISULPRIDE (ANTIEMETIC) 5 MG/2ML IV SOLN: 10.0000 mg | Freq: Once | INTRAVENOUS | Status: DC | PRN

## 2021-12-07 MED ORDER — HYDROMORPHONE HCL 1 MG/ML IJ SOLN
INTRAMUSCULAR | Status: AC
  Filled 2021-12-07: qty 1

## 2021-12-07 MED ORDER — ROCURONIUM BROMIDE 100 MG/10ML IV SOLN
INTRAVENOUS | Status: DC | PRN
  Administered 2021-12-07: 50 mg via INTRAVENOUS

## 2021-12-07 MED ORDER — SODIUM CHLORIDE 0.9 % IR SOLN
Status: DC | PRN
  Administered 2021-12-07: 2000 mL

## 2021-12-07 MED ORDER — ONDANSETRON HCL 4 MG/2ML IJ SOLN
INTRAMUSCULAR | Status: DC | PRN
  Administered 2021-12-07: 4 mg via INTRAVENOUS

## 2021-12-07 MED ORDER — SUGAMMADEX SODIUM 200 MG/2ML IV SOLN
INTRAVENOUS | Status: DC | PRN
  Administered 2021-12-07: 200 mg via INTRAVENOUS

## 2021-12-07 MED ORDER — ONDANSETRON HCL 4 MG/2ML IJ SOLN
INTRAMUSCULAR | Status: AC
  Filled 2021-12-07: qty 2

## 2021-12-07 MED ORDER — MIDAZOLAM HCL 2 MG/2ML IJ SOLN
INTRAMUSCULAR | Status: AC
  Filled 2021-12-07: qty 2

## 2021-12-07 MED ORDER — MAGNESIUM SULFATE 2 GM/50ML IV SOLN
2.0000 g | Freq: Once | INTRAVENOUS | Status: AC
  Administered 2021-12-07: 2 g via INTRAVENOUS
  Filled 2021-12-07: qty 50

## 2021-12-07 MED ORDER — CHLORHEXIDINE GLUCONATE 0.12 % MT SOLN: 15.0000 mL | Freq: Once | OROMUCOSAL | Status: DC

## 2021-12-07 MED ORDER — MIDAZOLAM HCL 5 MG/5ML IJ SOLN: INTRAMUSCULAR | Status: DC | PRN

## 2021-12-07 MED ORDER — POTASSIUM CHLORIDE ER 10 MEQ PO TBCR
40.0000 meq | EXTENDED_RELEASE_TABLET | Freq: Two times a day (BID) | ORAL | Status: AC
  Administered 2021-12-07: 40 meq via ORAL
  Filled 2021-12-07 (×3): qty 4

## 2021-12-07 MED ORDER — HYDROMORPHONE HCL 1 MG/ML IJ SOLN
0.2500 mg | INTRAMUSCULAR | Status: DC | PRN
  Administered 2021-12-07 (×2): 0.5 mg via INTRAVENOUS

## 2021-12-07 MED ORDER — FENTANYL CITRATE (PF) 100 MCG/2ML IJ SOLN
INTRAMUSCULAR | Status: DC | PRN
  Administered 2021-12-07 (×2): 50 ug via INTRAVENOUS

## 2021-12-07 MED ORDER — METHOCARBAMOL 500 MG PO TABS
500.0000 mg | ORAL_TABLET | Freq: Three times a day (TID) | ORAL | Status: DC | PRN
  Administered 2021-12-09 – 2021-12-10 (×3): 500 mg via ORAL
  Filled 2021-12-07 (×3): qty 1

## 2021-12-07 MED ORDER — PROPOFOL 10 MG/ML IV BOLUS
INTRAVENOUS | Status: DC | PRN
Start: 2021-12-07 — End: 2021-12-07
  Administered 2021-12-07: 50 mg via INTRAVENOUS
  Administered 2021-12-07: 150 mg via INTRAVENOUS

## 2021-12-07 MED ORDER — ARTIFICIAL TEARS OPHTHALMIC OINT
TOPICAL_OINTMENT | OPHTHALMIC | Status: AC
  Filled 2021-12-07: qty 3.5

## 2021-12-07 MED ORDER — HEPARIN (PORCINE) 25000 UT/250ML-% IV SOLN
1550.0000 [IU]/h | INTRAVENOUS | Status: DC
  Administered 2021-12-08: 1600 [IU]/h via INTRAVENOUS
  Administered 2021-12-09: 1700 [IU]/h via INTRAVENOUS
  Administered 2021-12-10 – 2021-12-12 (×4): 1550 [IU]/h via INTRAVENOUS
  Filled 2021-12-07 (×8): qty 250

## 2021-12-07 SURGICAL SUPPLY — 39 items
BLADE CLIPPER SURG (BLADE) ×2 IMPLANT
BNDG ELASTIC 4X5.8 VLCR STR LF (GAUZE/BANDAGES/DRESSINGS) IMPLANT
BNDG ELASTIC 6X5.8 VLCR STR LF (GAUZE/BANDAGES/DRESSINGS) IMPLANT
BNDG GAUZE ELAST 4 BULKY (GAUZE/BANDAGES/DRESSINGS) IMPLANT
CANISTER SUCT 3000ML PPV (MISCELLANEOUS) ×3 IMPLANT
CANISTER WOUNDNEG PRESSURE 500 (CANNISTER) ×1 IMPLANT
CLEANER TIP ELECTROSURG 2X2 (MISCELLANEOUS) ×1 IMPLANT
COVER SURGICAL LIGHT HANDLE (MISCELLANEOUS) ×6 IMPLANT
DRAPE INCISE IOBAN 66X45 STRL (DRAPES) ×1 IMPLANT
DRSG VAC ATS MED SENSATRAC (GAUZE/BANDAGES/DRESSINGS) ×1 IMPLANT
DRSG VERSA FOAM LRG 10X15 (GAUZE/BANDAGES/DRESSINGS) ×1 IMPLANT
ELECT REM PT RETURN 9FT ADLT (ELECTROSURGICAL) ×2
ELECTRODE REM PT RTRN 9FT ADLT (ELECTROSURGICAL) ×2 IMPLANT
GAUZE 4X4 16PLY ~~LOC~~+RFID DBL (SPONGE) ×3 IMPLANT
GAUZE SPONGE 4X4 12PLY STRL (GAUZE/BANDAGES/DRESSINGS) ×3 IMPLANT
GLOVE SURG SIGNA 7.5 PF LTX (GLOVE) ×3 IMPLANT
GOWN STRL REUS W/ TWL LRG LVL3 (GOWN DISPOSABLE) ×4 IMPLANT
GOWN STRL REUS W/ TWL XL LVL3 (GOWN DISPOSABLE) ×2 IMPLANT
GOWN STRL REUS W/TWL LRG LVL3 (GOWN DISPOSABLE) ×4
GOWN STRL REUS W/TWL XL LVL3 (GOWN DISPOSABLE) ×2
HANDPIECE INTERPULSE COAX TIP (DISPOSABLE) ×2
KIT BASIN OR (CUSTOM PROCEDURE TRAY) ×3 IMPLANT
KIT TURNOVER KIT B (KITS) ×3 IMPLANT
NS IRRIG 1000ML POUR BTL (IV SOLUTION) ×3 IMPLANT
PACK GENERAL/GYN (CUSTOM PROCEDURE TRAY) ×3 IMPLANT
PACK UNIVERSAL I (CUSTOM PROCEDURE TRAY) ×3 IMPLANT
PAD ARMBOARD 7.5X6 YLW CONV (MISCELLANEOUS) ×6 IMPLANT
PENCIL BUTTON HOLSTER BLD 10FT (ELECTRODE) ×1 IMPLANT
SET HNDPC FAN SPRY TIP SCT (DISPOSABLE) IMPLANT
SPONGE T-LAP 18X18 ~~LOC~~+RFID (SPONGE) ×12 IMPLANT
SPONGE T-LAP 4X18 ~~LOC~~+RFID (SPONGE) ×3 IMPLANT
STAPLER VISISTAT 35W (STAPLE) IMPLANT
SUT VIC AB 2-0 CTX 36 (SUTURE) IMPLANT
SUT VIC AB 3-0 SH 27 (SUTURE)
SUT VIC AB 3-0 SH 27X BRD (SUTURE) IMPLANT
SUT VIC AB 3-0 X1 27 (SUTURE) ×3 IMPLANT
TOWEL GREEN STERILE (TOWEL DISPOSABLE) ×3 IMPLANT
TOWEL GREEN STERILE FF (TOWEL DISPOSABLE) ×3 IMPLANT
WATER STERILE IRR 1000ML POUR (IV SOLUTION) ×3 IMPLANT

## 2021-12-07 NOTE — Progress Notes (Signed)
? ? ?  [facility] has been requested to perform a transesophageal echocardiogram on 04/21 for bacteremia.  After careful review of history and examination, the risks and benefits of transesophageal echocardiogram have been explained including risks of esophageal damage, perforation (1:10,000 risk), bleeding, pharyngeal hematoma as well as other potential complications associated with conscious sedation including aspiration, arrhythmia, respiratory failure and death. Alternatives to treatment were discussed, questions were answered. Patient is willing to proceed.  ? ?Theodore Demark, PA-C ?12/07/2021 6:30 PM  ? ?

## 2021-12-07 NOTE — Anesthesia Procedure Notes (Signed)
Procedure Name: Intubation ?Date/Time: 12/07/2021 12:03 PM ?Performed by: Mayer Camel, CRNA ?Pre-anesthesia Checklist: Patient identified, Emergency Drugs available, Suction available and Patient being monitored ?Patient Re-evaluated:Patient Re-evaluated prior to induction ?Oxygen Delivery Method: Circle system utilized ?Preoxygenation: Pre-oxygenation with 100% oxygen ?Induction Type: IV induction ?Ventilation: Mask ventilation without difficulty ?Laryngoscope Size: 2 ?Grade View: Grade I ?Tube type: Oral ?Tube size: 7.5 mm ?Number of attempts: 1 ?Airway Equipment and Method: Stylet ?Placement Confirmation: ETT inserted through vocal cords under direct vision, positive ETCO2 and breath sounds checked- equal and bilateral ?Secured at: 22 cm ?Tube secured with: Tape ?Dental Injury: Teeth and Oropharynx as per pre-operative assessment  ? ? ? ? ?

## 2021-12-07 NOTE — Interval H&P Note (Signed)
History and Physical Interval Note: ? ?12/07/2021 ?11:35 AM ? ?David Strong  has presented today for surgery, with the diagnosis of [place] JOINT ABSCESS.  The various methods of treatment have been discussed with the patient and family. After consideration of risks, benefits and other options for treatment, the patient has consented to  Procedure(s): ?IRRIGATION AND DEBRIDEMENT ABSCESS (Left) ?WOUND VAC CHANGE (Left) as a surgical intervention.  The patient's history has been reviewed, patient examined, no change in status, stable for surgery.  I have reviewed the patient's chart and labs.  Questions were answered to the patient's satisfaction.   ? ? ?[name] ? ? ?

## 2021-12-07 NOTE — Progress Notes (Signed)
PT Cancellation Note ? ?Patient Details ?Name: Ricke Kimoto ?MRN: [id number] ?DOB: 1956/01/15 ? ? ?Cancelled Treatment:    Reason Eval/Treat Not Completed: Patient at procedure or test/unavailable. Will plan to follow-up later as time permits. ? ? ?Raymond Gurney, PT, DPT ?Acute Rehabilitation Services  ?Pager: [phone] ?Office: [phone] ? ? ? ?Henrene Dodge Pettis ?12/07/2021, 1:41 PM ? ? ?

## 2021-12-07 NOTE — Progress Notes (Addendum)
ANTICOAGULATION CONSULT NOTE - Follow Up Consult ? ?Pharmacy Consult for Heparin ?Indication: DVT ? ?Allergies  ?Allergen Reactions  ? Codeine Itching  ? Glipizide Er [Glipizide]   ?  SOB, malaise. He tolerates XL without issues.  ? Metformin And Related Rash  ? ? ?Patient Measurements: ?Height: 5\' 4"  (162.6 cm) ?Weight: 82.2 kg (181 lb 3.5 oz) ?IBW/kg (Calculated) : 59.2 ?Heparin Dosing Weight:  77 kg ? ?Vital Signs: ?Temp: 98.2 ?F (36.8 ?C) (04/20 0330) ?Temp Source: Oral (04/20 0330) ?BP: 120/79 (04/20 0330) ?Pulse Rate: 93 (04/20 0330) ? ?Labs: ?Recent Labs  ?  12/04/21 ?2350 12/05/21 ?0700 12/05/21 ?1303 12/05/21 ?2025 12/06/21 ?0410 12/06/21 ?1045 12/06/21 ?1822 12/07/21 ?0530  ?HGB 10.3*  --  10.9*  --  10.0*  --   --  9.3*  ?HCT 32.0*  --  32.8*  --  30.3*  --   --  28.9*  ?PLT 406*  --  386  --  344  --   --  337  ?APTT 38*  --   --   --   --   --   --   --   ?LABPROT 17.8*  --   --   --   --   --   --   --   ?INR 1.5*  --   --   --   --   --   --   --   ?HEPARINUNFRC  --   --   --    < > 0.32 0.17* <0.10* 0.36  ?CREATININE 0.79   < > 0.60*  --  0.64  --   --  0.63  ? < > = values in this interval not displayed.  ? ? ? ?Estimated Creatinine Clearance: 89.1 mL/min (by C-G formula based on SCr of 0.63 mg/dL). ? ? ?Medical History: ?Past Medical History:  ?Diagnosis Date  ? Diabetes mellitus without complication (HCC)   ? ? ?Assessment: ?66 yo male with D-dimer 5.37.  CT chest negative for PE. Found to have new L DVT. Thrombocytopenia appears chronic. ? ?Patient was changed from heparin drip to appixaban 4/10.  Apixaban held for debridement 4/16 and heparin now ordered as bridge while apixaban on hold  ?No bolus and plans to return to OR for debridement and VAC change later this week.   ? ?Heparin level of 0.36 is therapeutic on heparin 1600 units/hr. Hgb 9.3. Plt wnl. No bleeding noted. Plan for OR today for VAC change.  ? ?Goal of Therapy:  ?Heparin level 0.3-0.5  ?Monitor platelets by anticoagulation  protocol: Yes ?  ?Plan:  ?Continue heparin 1600 units/hr  ?Follow up plans for resuming anticoagulation after OR today ?Monitor daily heparin level, CBC and s/s of bleeding  ? ? ?5/16, PharmD, BCPS ?Clinical Pharmacist ?12/07/2021 7:14 AM ? ? ?Addendum: Pharmacy consulted to resume heparin at midnight tonight with no bolus.  ? ?Plan: ?- Resume heparin at 1600 units/hr at midnight tonight ?- Check 6 hr heparin level  ? ?12/09/2021, PharmD, BCPS ?Clinical Pharmacist ?12/07/2021 1:18 PM ? ? ?

## 2021-12-07 NOTE — Progress Notes (Signed)
? ? ? [facility] ?                                                                            ? ? ?David Strong, is a 66 y.o. male, DOB - 30-Dec-1955, ATF:[id number] ?Admit date - 11/22/2021    ?Outpatient Primary MD for the patient is [name], MD ? ?LOS - 15  days ? ? ? ?Brief summary  ? ?66 year old male with medical history of diabetes mellitus type 2 presented to the ED with complaints of productive cough, subjective fever, chills, myalgia, severe arthralgia, new onset erythema and swelling of the left side of the base of the neck.  CTA showed no PE, confirmed right middle lobe pneumonia and bibasilar airspace opacities consistent with multifocal pneumonia and left sternoclavicular joint inflammatory changes with scattered foci of air within the soft tissue and sternoclavicular joint space compatible with left localized sternoclavicular septic arthritis/cellulitis, without evidence of drainable fluid collection or large joint effusion.  Subsequent BC ID and urine antigen positive for strep.  Antibiotics were narrowed down to ceftriaxone.  Newly diagnosed right upper arm SVT and left lower extremity DVT, briefly on IV heparin transition to Eliquis . ?MRI right shoulder findings suspicious for septic arthritis involving AC joint and glenohumeral joint.  Severe myositis and pyomyositis involving the subscapularis muscle, the anterior deltoid muscle and distal aspect of pectoralis muscle.  There is also myositis involving the infraspinatus muscle.   ?  ?Patient was seen by orthopedic surgery, no fluid could be aspirated on joint aspiration from the right shoulder.  He was seen by infectious disease and TEE was recommended, however patient did not want to get inpatient TEE so outpatient TEE was scheduled.  ID recommended to discharge home on 6 weeks of penicillin.  Home health RN/PT was arranged for IV antibiotics.  Eliquis also was sent to the pharmacy. ?  ?Before discharge patient complained of  worsening swelling of right chest wall, crepitus was palpated with likely subluxation of left clavicle at the sternoclavicular joint.  CT chest with contrast was obtained which showed progressive anterior subluxation of the left sternoclavicular joint with new erosive changes with bony fragmentation along the medial articular surface of the proximal left clavicle.  Findings on 7 4 septic arthritis.  There was progressive fluid and less collection centered on the left supraclavicular joint which extended to the left side of the neck.  Also Voll[place] to the left pectoralis muscle containing foci of gas and fluid.  Findings consistent with abscess. ?  ?CVTS was consulted, Dr. Roxan Hockey saw the patient and recommended transfer to [facility] for incision and drainage. ?Pt underwent s/p I&D on 12/05/21 and scheduled to have repeat debridement today.  ? ?  ?  ? ? ?Assessment & Plan  ? ? ?Assessment and Plan: ?* Severe sepsis ([place]) ?Due to streptococcal multifocal pneumonia with bacteremia, presumed left sternoclavicular joint septic arthritis left supraclavicular abscess and other polyarthralgia/arthritis (right shoulder, left knee) ?Met sepsis criteria on admission including persistent tachypnea, tachycardia and did spike a fever of 100.6 overnight of admission.  CTA chest confirmed multifocal pneumonia.   ?Empirically started on IV vancomycin, ceftriaxone and azithromycin. ?BCID and urine positive for  Streptococcus.  ID consulted and his antibiotics narrowed to  penicillin G on 4/10 plan for 6 weeks of antibiotics till 01/03/2022 ?TTE: LVEF 60-65%.  No evidence of valvular vegetations.  Surveillance blood cultures 4/7: NTD ?TEE Scheduled for 12/08/21 as per ID.  ? Outpatient follow up with ID.  ?Sepsis physiology has resolved but still with significant arthralgias/arthritis of above joints.   ?Pt was transferred to [facility] per cardiothoracic surgery request and he underwent irrigation and debridement of the left sternoclavicular  joint abscess on 12/04/21.  ? ?Septic arthritis ([place]) ?Left sternoclavicular joint septic arthritis ?Has other polyarthralgias/arthritis (right shoulder and left knee) ?See above. Left ankle swelling and left knee swelling with some cellulitis changes. Joint aspiration was attempted however no fluid obtained.   ?MRI of the left knee is pending.  ? ? ?DVT (deep venous thrombosis) ([place]) ? ?Left lower extremity: Age-indeterminate DVT left popliteal vein, acute DVT left gastrocnemius and left posterior tibial veins. ?Briefly on IV heparin, transitioned to Eliquis. ?Due to complaint of bilateral leg pain and swelling, obtained RLE venous Dopplers which were negative for DVT or SVT. ?Hematology consulted for further evaluation in view of hypoalbuminemia, DVT.  ?Hematology suggested at least 3 months of anti coagulation and outpatient follow up with hematology for anti coagulopathy work up.  ? ?Transaminitis ?Mild and likely related to severe sepsis. ?No GI symptoms. ?RUQ ultrasound with gallbladder sludge and no acute findings. ?Continue to trend daily CMP. ? ?High anion gap metabolic acidosis ?Lactic acidosis ?Secondary to severe sepsis. ?Bicarbonate normal.  Anion gap has normalized. ?Resolved. ? ?Thrombocytopenia ([place]) ?Suspect chronic or acute on chronic, probably from sepsis.  ?Platelet counts in 2018: 104 ?Resolved. ? ?Type 2 diabetes mellitus ([place]) ?A1c 8 on 10/12/2021. ?On glipizide and dapagliflozin at home.  Currently held. ?CBGs uncontrolled and fluctuating.   ? ?Recent Labs  ?  12/06/21 ?2127 12/07/21 ?0637 12/07/21 ?1106  ?GLUCAP 134* 141* 159*  ? ? ?Resume Semglee and SSI. No changes in meds.  ? ?Hypokalemia ?Replaced.   ? ?Hypophosphatemia ?Replaced. ? ?Bilateral leg edema ?Likely due to IV fluids early on in admission and hypoalbuminemic state. ?Also has left lower extremity DVT. ? ?Constipation ?Aggressive bowel regimen. ?BMs documented. ? ? ? ?Hypertension:  ?Suboptimal. Will add prn hydralazine.  ?   ? ? ? ?  ? ?Malnutrition Type: ? ?Nutrition Problem: Increased nutrient needs ?Etiology: acute illness ? ? ?Malnutrition Characteristics: ? ?Signs/Symptoms: estimated needs ? ? ?Nutrition Interventions: ? ?Interventions: MVI, Premier Protein, Other (Comment) (double protein) ? ?Estimated body mass index is 31.11 kg/m? as calculated from the following: ?  Height as of this encounter: '5\' 4"'  (1.626 m). ?  Weight as of this encounter: 82.2 kg. ? ?Code Status: Full code.  ?DVT Prophylaxis:  SCD's Start: 12/04/21 1836 ?SCDs Start: 11/22/21 1920 ? ? ?Level of Care: Level of care: Progressive ?Family Communication: None at bedside.  ? ?Disposition Plan:     Remains inpatient appropriate:  INCISION AND DEBRIDEMENT ? ?Procedures:  ?IRRIGATION AND DEBRIDMENT OF STERNOCLAVICULAR WOUND, WOUND VAC PLACEMENT on the left.Marland Kitchen ?Consultants:   ?Cardiothoracic surgery.  ? ?Antimicrobials:  ? ?Anti-infectives (From admission, onward)  ? ? Start     Dose/Rate Route Frequency Ordered Stop  ? 12/04/21 1930  penicillin G potassium 12 Million Units in dextrose 5 % 500 mL continuous infusion  Status:  Discontinued       ? 12 Million Units ?41.7 mL/hr over 12 Hours Intravenous Every 12 hours 12/04/21 1835 12/04/21 1928  ?  12/04/21 1000  vancomycin (VANCOCIN) IVPB 1000 mg/200 mL premix       ? 1,000 mg ?200 mL/hr over 60 Minutes Intravenous On call to O.R. 12/04/21 6283 12/04/21 1443  ? 12/01/21 0000  penicillin G IVPB       ? 24 Million Units Intravenous Every 24 hours 12/01/21 1303 01/03/22 2359  ? 11/27/21 1000  [MAR Hold]  penicillin G potassium 12 Million Units in dextrose 5 % 500 mL continuous infusion        (MAR Hold since Thu 12/07/2021 at 1135.Hold Reason: Transfer to a Procedural area)  ? 12 Million Units ?41.7 mL/hr over 12 Hours Intravenous Every 12 hours 11/27/21 0901    ? 11/23/21 2000  vancomycin (VANCOREADY) IVPB 1750 mg/350 mL  Status:  Discontinued       ? 1,750 mg ?175 mL/hr over 120 Minutes Intravenous Every 24 hours 11/22/21  1943 11/23/21 0931  ? 11/22/21 2300  cefTRIAXone (ROCEPHIN) 2 g in sodium chloride 0.9 % 100 mL IVPB  Status:  Discontinued       ? 2 g ?200 mL/hr over 30 Minutes Intravenous Every 24 hours 11/22/21 1923

## 2021-12-07 NOTE — Anesthesia Preprocedure Evaluation (Addendum)
Anesthesia Evaluation  ?Patient identified by MRN, date of birth, ID band ?Patient awake ? ? ? ?Reviewed: ?Allergy & Precautions, NPO status , Patient's Chart, lab work & pertinent test results ? ?History of Anesthesia Complications ?Negative for: history of anesthetic complications ? ?Airway ?Mallampati: II ? ?TM Distance: >3 FB ?Neck ROM: Full ? ? ? Dental ? ?(+) Dental Advisory Given ?  ?Pulmonary ?pneumonia, former smoker,  ?  ?Pulmonary exam normal ?breath sounds clear to auscultation ? ? ? ? ? ? Cardiovascular ?+ DVT  ?Normal cardiovascular exam ?Rhythm:Regular Rate:Normal ? ?Echo 11/2021 ??1. Left ventricular ejection fraction, by estimation, is 60 to 65%. The left ventricle has normal function. The left ventricle has no regional wall motion abnormalities. Left ventricular diastolic parameters are indeterminate.  ??2. Right ventricular systolic function is normal. The right ventricular size is normal. There is normal pulmonary artery systolic pressure.  ??3. The mitral valve is normal in structure. Trivial mitral valve regurgitation. No evidence of mitral stenosis.  ??4. The aortic valve is tricuspid. Aortic valve regurgitation is not visualized. No aortic stenosis is present.  ??5. The inferior vena cava is normal in size with greater than 50% respiratory variability, suggesting right atrial pressure of 3 mmHg.  ? ?  ?Neuro/Psych ?negative neurological ROS ? negative psych ROS  ? GI/Hepatic ?negative GI ROS, Neg liver ROS,   ?Endo/Other  ?diabetes, Type 2, Oral Hypoglycemic Agents ?Obesity ?Na 131 ?Ca 8 ?Cl 94 ? ? Renal/GU ?negative Renal ROS  ? ?  ?Musculoskeletal ? ?(+) Arthritis ,  ? Abdominal ?  ?Peds ? Hematology ? ?(+) Blood dyscrasia, anemia ,  ?On eliquis ?   ?Anesthesia Other Findings ? ? Reproductive/Obstetrics ? ?  ? ? ? ? ? ? ? ? ? ? ? ? ? ?  ?  ? ? ? ? ? ? ?Anesthesia Physical ? ?Anesthesia Plan ? ?ASA: 3 ? ?Anesthesia Plan: General  ? ?Post-op Pain Management:  Tylenol PO (pre-op)*  ? ?Induction: Intravenous ? ?PONV Risk Score and Plan: 3 and Treatment may vary due to age or medical condition, Ondansetron and Dexamethasone ? ?Airway Management Planned: Oral ETT and LMA ? ?Additional Equipment: None ? ?Intra-op Plan:  ? ?Post-operative Plan: Extubation in OR ? ?Informed Consent: I have reviewed the patients History and Physical, chart, labs and discussed the procedure including the risks, benefits and alternatives for the proposed anesthesia with the patient or authorized representative who has indicated his/her understanding and acceptance.  ? ? ? ?Dental advisory given ? ?Plan Discussed with: CRNA ? ?Anesthesia Plan Comments:   ? ? ? ? ?Anesthesia Quick Evaluation ? ?

## 2021-12-07 NOTE — Transfer of Care (Signed)
Immediate Anesthesia Transfer of Care Note ? ?Patient: David Strong ? ?Procedure(s) Performed: IRRIGATION AND DEBRIDEMENT ABSCESS (Left: Chest) ?WOUND VAC CHANGE (Left: Chest) ? ?Patient Location: PACU ? ?Anesthesia Type:General ? ?Level of Consciousness: awake, alert  and oriented ? ?Airway & Oxygen Therapy: Patient Spontanous Breathing and Patient connected to face mask oxygen ? ?Post-op Assessment: Report given to RN, Post -op Vital signs reviewed and stable and Patient moving all extremities ? ?Post vital signs: Reviewed and stable  ? ? ?Last Vitals:  ?Vitals Value Taken Time  ?BP 132/82 12/07/21 1255  ?Temp    ?Pulse 93 12/07/21 1259  ?Resp 21 12/07/21 1259  ?SpO2 96 % 12/07/21 1259  ?Vitals shown include unvalidated device data. ? ?Last Pain:  ?Vitals:  ? 12/07/21 1141  ?TempSrc: Oral  ?PainSc:   ?   ? ?Patients Stated Pain Goal: 0 (12/06/21 2100) ? ?Complications: No notable events documented. ?

## 2021-12-07 NOTE — Op Note (Signed)
NAME: David Strong, CHANCY ?MEDICAL RECORD NO: [id number] ?ACCOUNT NO: [id number] ?DATE OF BIRTH: Mar 02, 1956 ?FACILITY: [facility] ?LOCATION: [facility] ?PHYSICIAN: Salvatore Decent. Dorris Fetch, MD ? ?Operative Report  ? ?DATE OF PROCEDURE: 12/07/2021 ? ? ?PREOPERATIVE DIAGNOSIS:  Left supraclavicular joint abscess. ? ?POSTOPERATIVE DIAGNOSIS:  Left supraclavicular joint abscess. ? ?PROCEDURE:  Wound VAC change and excisional debridement of left sternoclavicular joint abscess. ? ?SURGEON:  Salvatore Decent. Dorris Fetch, MD ? ?ASSISTANT:  None. ? ?ANESTHESIA:  General. ? ?FINDINGS:  Early granulation tissue over about 80% of the wound surface.  Some additional necrotic tissue at the base of the wound which was debrided.  Final wound measurement 8 cm in length, 4 cm in width, 5 cm in depth. ? ?CLINICAL NOTE: Mr. Baines is a 66 year old man who had been admitted with community-acquired pneumonia.  While in the hospital, he had progression of a left sternoclavicular joint abscess with extension into the neck and under the pectoralis muscle.  He had  ?undergone incision and drainage and VAC placement, now returns for additional debridement and VAC change.  The indications, risks, benefits, and alternatives were discussed in detail with the patient.  He accepted the risks and agreed to proceed. ? ?DESCRIPTION OF PROCEDURE: Mr. Auxier was brought to the operating room on 12/07/2021.  General anesthesia was induced.  The VAC dressing sponge was removed and the skin was prepped and draped in the usual sterile fashion. ? ?A timeout was performed.  The deeper VAC sponges were removed, approximately 80% of the wound was clean and granulating.  There was some tissue, mostly along the deep portion of the wound which was necrotic.  This necrotic tissue was debrided sharply.  Hemostasis was  ?achieved with cautery.  The wound was irrigated with 2 liters of saline using pulse lavage. 3 separate small pieces of white VAC sponge were placed, one extending into the  neck and one below the tip of the clavicle and one extending into the subpectoral  ?pocket.  A black sponge then was cut to fit the wound, the dressing was applied and the vacuum was applied.  There was a good seal.  All sponge, needle and instrument counts were correct at the end of the procedure.  The patient was taken from the operating  ?room to the postanesthetic care unit in good condition.  ? ? ? ?SUJ ?D: 12/07/2021 4:56:40 pm T: 12/07/2021 11:05:00 pm  ?JOB: [id number]/ [id number]  ?

## 2021-12-07 NOTE — OR Nursing (Signed)
Negative pressure dressing - 2 pieces of white sponge & 1 piece of black sponge used, occlusive dressing applied over sponges ?

## 2021-12-07 NOTE — Progress Notes (Signed)
Suanne Marker B/PA has talked with patient about possible TEE procedure for 4/21---patient (with wife in room) is reluctant to signing consent, he does not want to do this procedure at this time---I have placed unsigned consent on chart, and advised patient he can talk with someone else in the morning and/or change his mind and sign at any time. ?

## 2021-12-07 NOTE — Brief Op Note (Signed)
12/07/2021 ? ?12:46 PM ? ?PATIENT:  David Strong  66 y.o. male ? ?PRE-OPERATIVE DIAGNOSIS:  [place] JOINT ABSCESS ? ?POST-OPERATIVE DIAGNOSIS:  [place] JOINT ABSCESS ? ?PROCEDURE:  Procedure(s): ?IRRIGATION AND DEBRIDEMENT ABSCESS (Left) ?WOUND VAC CHANGE (Left) ?Excisional debridement, Pulse lavage ? ?SURGEON:  Surgeon(s) and Role: ?   * [name], MD - Primary ? ?PHYSICIAN ASSISTANT:  ? ?ASSISTANTS: none  ? ?ANESTHESIA:   general ? ?EBL:  20 mL  ? ?BLOOD ADMINISTERED:none ? ?DRAINS:  VAC, left Leslie joint   ? ?LOCAL MEDICATIONS USED:  NONE ? ?SPECIMEN:  Source of Specimen:  joint tissue ? ?DISPOSITION OF SPECIMEN:  PATHOLOGY ? ?COUNTS:  YES ? ?TOURNIQUET:  * No tourniquets in log * ? ?DICTATION: .Other Dictation: Dictation Number - ? ?PLAN OF CARE:  Already inpatient ? ?PATIENT DISPOSITION:  PACU - hemodynamically stable. ?  ?Delay start of Pharmacological VTE agent (>24hrs) due to surgical blood loss or risk of bleeding: resume heparin at midnight  ? ?

## 2021-12-07 NOTE — Progress Notes (Signed)
?   ? ? ? ? ?[facility] ? ?Date of Admission:  11/22/2021   Total days of inpatient antibiotics 9 ?Principal Problem: ?  Severe sepsis (HCC) ?Active Problems: ?  Thrombocytopenia (HCC) ?  Type 2 diabetes mellitus (HCC) ?  CAP (community acquired pneumonia) ?  High anion gap metabolic acidosis ?  Septic arthritis (HCC) ?  Transaminitis ?  Pneumococcal bacteremia ?  Hypokalemia ?  Hypophosphatemia ?  DVT (deep venous thrombosis) (HCC) ?  Constipation ?  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  Bilateral leg edema ?  Obese ?     ?    ?Assessment: ?9 YM admitted with pneumonia after presenting with cough for 2-3 days found to have strep pneumo bacteremia and multiple  septic joints and pyomyositis. ? 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 ? ?-Seen by Ortho on 4/13 and multiple areas examined. . No fluid was able to be aspirated around subacromial outlet, shoulder, glenohumeral joint. No fluid sound on aspiration of right shoulder and left knee on 4/12. Felt to fit myositis picture. No current plans for operative intervention.  ?-Pt likely had pneumonia 2/2 strep pneumo leading to bacteremia and likely endocarditis. TTE did not show vegetation. Given pt's multiple foci of infection/thrombus it is suspicious for septic emboli as such plan to get TEE.  ? ?-Initially pt planned to go home. Noted to have increased chest swelling. CT chest showed findings concerning for abscess extending from left supraclavicular join to neck,  left pectoralis major involved. Ground glass attenuation RML. Bony fragmentation along proximal left clavicle.  ?-CTS engaged and pt transferred to [facility] for I&D of left [place] joint and excision of abscess on 4/17. OR findings noted  joint completely destroyed, necrotic debris extending into the neck. Head of clavicle free of attachments.  Cx with NG. ? ?Recommendations: ?-MRI left knee. Left knee pain/swelling is worse this AM. He seen by Ortho on 4/13 and no fluid collection found on aspiration. Pending MRI would have low threshold to re-engage Ortho. ?-Continue Penicillin (Anticipate 6 weeks from I&D) ?-TEE planned to  Friday 12/08/21 ? ?Microbiology:   ?Antibiotics: ?Azithormycin 4/5 ?Cefepime 4/5 ?Ceftriaxone 4/5-4/9 ?Penicillin 4/10-p ?Cultures: ?Blood ?4/5 2/2 strep pneumo ?4/6 NG ?4/7 NG ? ?4/17 OR I&D of chest abscess NG x3 ? ?SUBJECTIVE: ?Sitting at the edge of bed this AM. Pt reports left knee pain is worse.  ? ?Review of Systems: ?Review of Systems  ?All other systems reviewed and are negative. ? ? ?Scheduled Meds: ? acetaminophen  1,000 mg Oral Q6H  ? Or  ? acetaminophen (TYLENOL) oral liquid 160 mg/5 mL  1,000 mg Oral Q6H  ? bisacodyl  10 mg Oral Daily  ? Chlorhexidine Gluconate Cloth  6 each Topical Daily  ? insulin aspart  0-15 Units Subcutaneous TID WC  ? insulin aspart  0-5 Units Subcutaneous QHS  ? insulin aspart  8 Units Subcutaneous TID WC  ? insulin glargine-yfgn  40 Units Subcutaneous Daily  ? lidocaine  1 patch Transdermal Q24H  ? multivitamin with minerals  1 tablet Oral Daily  ? polyethylene glycol  17 g Oral BID  ? potassium chloride  40 mEq Oral BID  ? Ensure Max Protein  11 oz Oral BID  ? senna-docusate  1 tablet Oral QHS  ? senna-docusate  2 tablet Oral Daily  ? simethicone  80 mg Oral QID  ? ?Continuous Infusions: ? sodium chloride 75 mL/hr at 12/06/21 2118  ? penicillin g continuous IV infusion 12 Million Units (12/06/21 0631)  ? ?PRN Meds:.acetaminophen **OR** [DISCONTINUED]  acetaminophen, alum & mag hydroxide-simeth, benzonatate, bisacodyl, fentaNYL (SUBLIMAZE) injection, hydrALAZINE, methocarbamol, naLOXone (NARCAN)  injection, ondansetron (ZOFRAN) IV, oxyCODONE, oxyCODONE, sodium chloride flush ?Allergies  ?Allergen Reactions  ? Codeine Itching  ? Glipizide Er [Glipizide]   ?  SOB, malaise. He tolerates XL without issues.  ? Metformin And Related Rash  ? ? ?OBJECTIVE: ?Vitals:  ? 12/06/21 1958 12/06/21 2339 12/07/21 0330 12/07/21 0829  ?BP: 134/82 135/79 120/79 131/77  ?Pulse: 94 99 93 94  ?Resp: 17 20 15 19   ?Temp: 98.2 ?F (36.8 ?C) 98 ?F (36.7 ?C) 98.2 ?F (36.8 ?C) 97.9 ?F (36.6 ?C)  ?TempSrc: Oral Oral Oral Oral  ?SpO2: 97% 96% 97% 94%  ?Weight:      ?Height:      ? ?Body mass index is 31.11 kg/m?. ? ?Physical Exam ?Constitutional:   ?   General: He is not in acute distress. ?   Appearance: He is normal weight. He is not toxic-appearing.  ?HENT:  ?   Head: Normocephalic and atraumatic.  ?   Right Ear: External ear normal.  ?   Left Ear: External ear normal.  ?   Nose: No congestion or rhinorrhea.  ?   Mouth/Throat:  ?   Mouth: Mucous membranes are moist.  ?   Pharynx: Oropharynx is clear.  ?Eyes:  ?   Extraocular Movements: Extraocular movements intact.  ?   Conjunctiva/sclera: Conjunctivae normal.  ?   Pupils: Pupils are equal, round, and reactive to light.  ?Cardiovascular:  ?   Rate and Rhythm: Normal rate and regular rhythm.  ?   Heart sounds: No murmur heard. ?  No friction rub. No gallop.  ?   Comments: Superior chest wall edematous and tender.  ?Pulmonary:  ?   Effort: Pulmonary effort is normal.  ?   Breath sounds: Normal breath sounds.  ?Abdominal:  ?   General: Abdomen is flat. Bowel sounds are normal.  ?   Palpations: Abdomen is soft.  ?Musculoskeletal:     ?   General: No swelling. Normal range of motion.  ?   Cervical back: Normal range of motion and neck supple.  ?Skin: ?   General: Skin is warm and dry.  ?Neurological:  ?   General: No focal deficit present.  ?    Mental Status: He is oriented to person, place, and time.  ?Psychiatric:     ?   Mood and Affect: Mood normal.  ? ? ? ? ?Lab Results ?Lab Results  ?Component Value Date  ? WBC 5.1 12/07/2021  ? HGB 9.3 (L) 12/07/2021  ? HCT 28.9 (L) 12/07/2021  ? MCV 90.6 12/07/2021  ? PLT 337 12/07/2021  ?  ?Lab Results  ?Component Value Date  ? CREATININE 0.63 12/07/2021  ? BUN <5 (L) 12/07/2021  ? NA 132 (L) 12/07/2021  ? K 3.5 12/07/2021  ? CL 99 12/07/2021  ? CO2 26 12/07/2021  ?  ?Lab Results  ?Component Value Date  ? ALT 28 12/07/2021  ? AST 29 12/07/2021  ? GGT 141 (H) 11/23/2021  ? ALKPHOS 166 (H) 12/07/2021  ?  BILITOT 0.6 12/07/2021  ?  ? ? ? ? ?Danelle EarthlyMayanka Emmajean Ratledge, MD ?[facility] ?[facility] ?12/07/2021, 10:57 AM  ?

## 2021-12-07 NOTE — Progress Notes (Incomplete)
PHARMACY CONSULT NOTE FOR: ? ?OUTPATIENT  PARENTERAL ANTIBIOTIC THERAPY (OPAT) ? ?Indication:  ?Regimen:  ?End date:  ? ?IV antibiotic discharge orders are pended. ?To discharging provider:  please sign these orders via discharge navigator,  ?Select New Orders & click on the button choice - Manage This Unsigned Work.  ?  ? ?Thank you for allowing pharmacy to be a part of this patient's care. ? ?David Strong ?12/07/2021, 11:21 AM ?

## 2021-12-08 ENCOUNTER — Inpatient Hospital Stay (HOSPITAL_COMMUNITY): Payer: Medicare Other | Admitting: Anesthesiology

## 2021-12-08 ENCOUNTER — Inpatient Hospital Stay (HOSPITAL_COMMUNITY): Payer: Medicare Other

## 2021-12-08 ENCOUNTER — Encounter (HOSPITAL_COMMUNITY): Payer: Self-pay | Admitting: Thoracic Surgery (Cardiothoracic Vascular Surgery)

## 2021-12-08 ENCOUNTER — Encounter (HOSPITAL_COMMUNITY)
Admission: EM | Disposition: A | Discharge status: Home Health | Payer: Self-pay | Source: Ambulatory Visit | Attending: Internal Medicine

## 2021-12-08 DIAGNOSIS — E119 Type 2 diabetes mellitus without complications: Secondary | ICD-10-CM

## 2021-12-08 DIAGNOSIS — I341 Nonrheumatic mitral (valve) prolapse: Secondary | ICD-10-CM | POA: Diagnosis not present

## 2021-12-08 DIAGNOSIS — I351 Nonrheumatic aortic (valve) insufficiency: Secondary | ICD-10-CM | POA: Diagnosis not present

## 2021-12-08 DIAGNOSIS — A419 Sepsis, unspecified organism: Secondary | ICD-10-CM | POA: Diagnosis not present

## 2021-12-08 DIAGNOSIS — R7881 Bacteremia: Secondary | ICD-10-CM

## 2021-12-08 DIAGNOSIS — M1990 Unspecified osteoarthritis, unspecified site: Secondary | ICD-10-CM

## 2021-12-08 DIAGNOSIS — R6520 Severe sepsis without septic shock: Secondary | ICD-10-CM | POA: Diagnosis not present

## 2021-12-08 DIAGNOSIS — J189 Pneumonia, unspecified organism: Secondary | ICD-10-CM | POA: Diagnosis not present

## 2021-12-08 DIAGNOSIS — Z794 Long term (current) use of insulin: Secondary | ICD-10-CM

## 2021-12-08 HISTORY — PX: TEE WITHOUT CARDIOVERSION: SHX5443

## 2021-12-08 LAB — CBC WITH DIFFERENTIAL/PLATELET
Abs Immature Granulocytes: 0.03 10*3/uL (ref 0.00–0.07)
Basophils Absolute: 0 10*3/uL (ref 0.0–0.1)
Basophils Relative: 0 %
Eosinophils Absolute: 0.1 10*3/uL (ref 0.0–0.5)
Eosinophils Relative: 2 %
HCT: 27.5 % — ABNORMAL LOW (ref 39.0–52.0)
Hemoglobin: 9.1 g/dL — ABNORMAL LOW (ref 13.0–17.0)
Immature Granulocytes: 1 %
Lymphocytes Relative: 19 %
Lymphs Abs: 1 10*3/uL (ref 0.7–4.0)
MCH: 30.2 pg (ref 26.0–34.0)
MCHC: 33.1 g/dL (ref 30.0–36.0)
MCV: 91.4 fL (ref 80.0–100.0)
Monocytes Absolute: 0.5 10*3/uL (ref 0.1–1.0)
Monocytes Relative: 9 %
Neutro Abs: 3.7 10*3/uL (ref 1.7–7.7)
Neutrophils Relative %: 69 %
Platelets: 270 10*3/uL (ref 150–400)
RBC: 3.01 MIL/uL — ABNORMAL LOW (ref 4.22–5.81)
RDW: 13.6 % (ref 11.5–15.5)
WBC: 5.3 10*3/uL (ref 4.0–10.5)
nRBC: 0 % (ref 0.0–0.2)

## 2021-12-08 LAB — COMPREHENSIVE METABOLIC PANEL
ALT: 31 U/L (ref 0–44)
AST: 39 U/L (ref 15–41)
Albumin: <1.5 g/dL — ABNORMAL LOW (ref 3.5–5.0)
Alkaline Phosphatase: 185 U/L — ABNORMAL HIGH (ref 38–126)
Anion gap: 3 — ABNORMAL LOW (ref 5–15)
BUN: <5 mg/dL — ABNORMAL LOW (ref 8–23)
CO2: 29 mmol/L (ref 22–32)
Calcium: 7.6 mg/dL — ABNORMAL LOW (ref 8.9–10.3)
Chloride: 100 mmol/L (ref 98–111)
Creatinine, Ser: 0.6 mg/dL — ABNORMAL LOW (ref 0.61–1.24)
GFR, Estimated: >60 mL/min (ref >60)
Glucose, Bld: 201 mg/dL — ABNORMAL HIGH (ref 70–99)
Potassium: 3.7 mmol/L (ref 3.5–5.1)
Sodium: 132 mmol/L — ABNORMAL LOW (ref 135–145)
Total Bilirubin: 0.4 mg/dL (ref 0.3–1.2)
Total Protein: 5.3 g/dL — ABNORMAL LOW (ref 6.5–8.1)

## 2021-12-08 LAB — GLUCOSE, CAPILLARY
Glucose-Capillary: 132 mg/dL — ABNORMAL HIGH (ref 70–99)
Glucose-Capillary: 72 mg/dL (ref 70–99)
Glucose-Capillary: 177 mg/dL — ABNORMAL HIGH (ref 70–99)
Glucose-Capillary: 103 mg/dL — ABNORMAL HIGH (ref 70–99)

## 2021-12-08 LAB — ECHO TEE
AV Mean grad: 5 mmHg
AV Peak grad: 9.4 mmHg
Ao pk vel: 1.53 m/s

## 2021-12-08 LAB — HEPARIN LEVEL (UNFRACTIONATED)
Heparin Unfractionated: 0.22 IU/mL — ABNORMAL LOW (ref 0.30–0.70)
Heparin Unfractionated: 0.56 IU/mL (ref 0.30–0.70)

## 2021-12-08 LAB — PHOSPHORUS: Phosphorus: 2.9 mg/dL (ref 2.5–4.6)

## 2021-12-08 LAB — SURGICAL PATHOLOGY

## 2021-12-08 LAB — MAGNESIUM: Magnesium: 1.8 mg/dL (ref 1.7–2.4)

## 2021-12-08 SURGERY — ECHOCARDIOGRAM, TRANSESOPHAGEAL
Anesthesia: Monitor Anesthesia Care

## 2021-12-08 MED ORDER — PHENYLEPHRINE 80 MCG/ML (10ML) SYRINGE FOR IV PUSH (FOR BLOOD PRESSURE SUPPORT)
PREFILLED_SYRINGE | INTRAVENOUS | Status: DC | PRN
Start: 2021-12-08 — End: 2021-12-08
  Administered 2021-12-08: 80 ug via INTRAVENOUS

## 2021-12-08 MED ORDER — FUROSEMIDE 10 MG/ML IJ SOLN
40.0000 mg | Freq: Once | INTRAMUSCULAR | Status: AC
Start: 2021-12-08 — End: 2021-12-08
  Administered 2021-12-08: 40 mg via INTRAVENOUS
  Filled 2021-12-08: qty 4

## 2021-12-08 MED ORDER — SODIUM CHLORIDE 0.9 % IV SOLN: INTRAVENOUS | Status: DC

## 2021-12-08 MED ORDER — PROPOFOL 500 MG/50ML IV EMUL
INTRAVENOUS | Status: DC | PRN
  Administered 2021-12-08: 75 ug/kg/min via INTRAVENOUS
  Administered 2021-12-08: 20 mg via INTRAVENOUS

## 2021-12-08 NOTE — Progress Notes (Addendum)
?   ? ? ? ? ?[facility] ? ?Date of Admission:  11/22/2021   Total days of inpatient antibiotics 9 ?Principal Problem: ?  Severe sepsis (HCC) ?Active Problems: ?  Thrombocytopenia (HCC) ?  Type 2 diabetes mellitus (HCC) ?  CAP (community acquired pneumonia) ?  High anion gap metabolic acidosis ?  Septic arthritis (HCC) ?  Transaminitis ?  Pneumococcal bacteremia ?  Hypokalemia ?  Hypophosphatemia ?  DVT (deep venous thrombosis) (HCC) ?  Constipation ?  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  Bilateral leg edema ?  Obese ?     ?    ?Assessment: ?[age] YM admitted with pneumonia after presenting with cough for 2-3 days found to have strep pneumo bacteremia and multiple  septic joints and pyomyositis. ? 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 ? ?-Seen by Ortho on 4/13 and multiple areas examined. . No fluid was able to be aspirated around subacromial outlet, shoulder, glenohumeral joint. No fluid sound on aspiration of right shoulder and left knee on 4/12. Felt to fit myositis picture. No current plans for operative intervention.  ?-Pt likely had pneumonia 2/2 strep pneumo leading to bacteremia and likely endocarditis. TTE did not show vegetation. Given pt's multiple foci of infection/thrombus it is suspicious for septic emboli as such plan to get TEE.  ? ?-Initially pt planned to go home. Noted to have increased chest swelling. CT chest showed findings concerning for abscess extending from left supraclavicular join to neck,  left pectoralis major involved. Ground glass attenuation RML. Bony fragmentation along proximal left clavicle.  ?-CTS engaged and pt transferred [facility] for I&D of left [place] joint and excision of abscess on 4/17. OR findings noted [place] joint completely destroyed, necrotic debris extending into the neck. Head of clavicle free of attachments.  Cx with NG.Underwent 2nd I&D on 4/20 with additional necrotic tissue debrided.  ? ?12/08/21: Discussed the need tfor TEE given suspicion of multiple septic emboli and endocarditis. I notified the patient that we have not imaged his knee. He states he will call his wife and let us know if he is amenable to TEE. He is now amenable to MRI of knee.  ? ?Recommendations: ?-MRI left knee. Left knee pain/swelling is worse this AM. He seen by Ortho on 4/13 and no fluid collection found on aspiration. Pending MRI would have low threshold to re-engage Ortho. ?-Continue Penicillin (Anticipate 6 weeks from last I&D) ?-TEE planned for today ?-Dr. Daiva EvesVan Dam will be following this weekend ? ?Microbiology:   ?Antibiotics: ?Azithormycin 4/5 ?Cefepime 4/5 ?Ceftriaxone 4/5-4/9 ?Penicillin 4/10-p ?Cultures: ?Blood ?4/5 2/2 strep pneumo ?4/6 NG ?4/7 NG ? ?4/17 OR I&D of chest abscess NG x3 ? ?SUBJECTIVE: ?Today, pt reported he was hungry and that is partly why he is declining TEE. Declining MRI LLE as he believes his LLE was imaged.  ? ?Review of Systems: ?Review of Systems  ?Constitutional:  Negative for chills.  ?All other systems reviewed and are negative. ? ? ?Scheduled Meds: ? acetaminophen  1,000 mg Oral Q6H  ? Or  ? acetaminophen (TYLENOL) oral liquid 160 mg/5 mL  1,000 mg Oral Q6H  ?  bisacodyl  10 mg Oral Daily  ? Chlorhexidine Gluconate Cloth  6 each Topical Daily  ? insulin aspart  0-15 Units Subcutaneous TID WC  ? insulin aspart  0-5 Units Subcutaneous QHS  ? insulin aspart  8 Units Subcutaneous TID WC  ? insulin glargine-yfgn  40 Units Subcutaneous Daily  ? lidocaine  1 patch Transdermal Q24H  ?  multivitamin with minerals  1 tablet Oral Daily  ? polyethylene glycol  17 g Oral BID  ? Ensure Max Protein  11 oz Oral BID  ? senna-docusate  1 tablet Oral QHS  ? senna-docusate  2 tablet Oral Daily  ? simethicone  80 mg Oral QID  ? ?Continuous Infusions: ? sodium chloride 75 mL/hr at 12/08/21 0800  ? sodium chloride    ? heparin 1,700 Units/hr (12/08/21 1005)  ? penicillin g continuous IV infusion 12 Million Units (12/08/21 6384)  ? ?PRN Meds:.acetaminophen **OR** [DISCONTINUED] acetaminophen, alum & mag hydroxide-simeth, benzonatate, bisacodyl, fentaNYL (SUBLIMAZE) injection, hydrALAZINE, methocarbamol, naLOXone (NARCAN)  injection, ondansetron (ZOFRAN) IV, oxyCODONE, oxyCODONE, sodium chloride flush ?Allergies  ?Allergen Reactions  ? Codeine Itching  ? Glipizide Er [Glipizide]   ?  SOB, malaise. He tolerates XL without issues.  ? Metformin And Related Rash  ? ? ?OBJECTIVE: ?Vitals:  ? 12/08/21 0630 12/08/21 0700 12/08/21 0722 12/08/21 0800  ?BP:  122/78 116/80 119/77  ?Pulse: 94 95 91 90  ?Resp: 17 15 (!) 21 13  ?Temp:   98.3 ?F (36.8 ?C)   ?TempSrc:   Oral   ?SpO2: 95% 96% 96% 94%  ?Weight:      ?Height:      ? ?Body mass index is 30.5 kg/m?. ? ?Physical Exam ?Constitutional:   ?   General: He is not in acute distress. ?   Appearance: He is normal weight. He is not toxic-appearing.  ?HENT:  ?   Head: Normocephalic and atraumatic.  ?   Right Ear: External ear normal.  ?   Left Ear: External ear normal.  ?   Nose: No congestion or rhinorrhea.  ?   Mouth/Throat:  ?   Mouth: Mucous membranes are moist.  ?   Pharynx: Oropharynx is clear.  ?Eyes:  ?   Extraocular Movements: Extraocular movements intact.  ?   Conjunctiva/sclera: Conjunctivae normal.  ?   Pupils: Pupils are equal, round, and reactive to light.  ?Cardiovascular:  ?   Rate and Rhythm: Normal rate and regular rhythm.  ?   Heart sounds: No murmur heard. ?  No friction rub. No gallop.  ?   Comments: Superior chest wall edematous and tender.  ?Pulmonary:  ?    Effort: Pulmonary effort is normal.  ?   Breath sounds: Normal breath sounds.  ?Abdominal:  ?   General: Abdomen is flat. Bowel sounds are normal.  ?   Palpations: Abdomen is soft.  ?Musculoskeletal:     ?   General: No swelling. Normal range of motion.  ?   Cervical back: Normal range of motion and neck supple.  ?Skin: ?   General: Skin is warm and dry.  ?Neurological:  ?   General: No focal deficit present.  ?   Mental Status: He is oriented to person, place, and time.  ?Psychiatric:     ?   Mood and Affect: Mood normal.  ? ? ? ? ?Lab Results ?Lab Results  ?Component Value Date  ? WBC 5.3 12/08/2021  ? HGB 9.1 (L) 12/08/2021  ? HCT 27.5 (L) 12/08/2021  ?  MCV 91.4 12/08/2021  ? PLT 270 12/08/2021  ?  ?Lab Results  ?Component Value Date  ? CREATININE 0.60 (L) 12/08/2021  ? BUN <5 (L) 12/08/2021  ? NA 132 (L) 12/08/2021  ? K 3.7 12/08/2021  ? CL 100 12/08/2021  ? CO2 29 12/08/2021  ?  ?Lab Results  ?Component Value Date  ? ALT 31 12/08/2021  ? AST 39 12/08/2021  ? GGT 141 (H) 11/23/2021  ? ALKPHOS 185 (H) 12/08/2021  ? BILITOT 0.4 12/08/2021  ?  ? ? ? ? ?Danelle Earthly, MD ?[facility] ?[facility] ?12/08/2021, 10:45 AM  ?

## 2021-12-08 NOTE — Progress Notes (Signed)
?  Echocardiogram ?Echocardiogram Transesophageal has been performed. ? ?Janalyn Harder ?12/08/2021, 2:08 PM ?

## 2021-12-08 NOTE — Progress Notes (Signed)
OT Cancellation Note ? ?Patient Details ?Name: Asaad Gulley ?MRN: [id number] ?DOB: 24-Nov-1955 ? ? ?Cancelled Treatment:    Reason Eval/Treat Not Completed: Patient at procedure or test/ unavailable Off unit for TEE followed by MRI of R knee. Will follow-up as schedule permits. ? ?Per secure chat, confirmed with cardiothoracic surgery PA, no specific restrictions for UE after surgeries.  ? ?Lorre Munroe ?12/08/2021, 1:27 PM ?

## 2021-12-08 NOTE — Plan of Care (Signed)
  Problem: Education: Goal: Knowledge of General Education information will improve Description: Including pain rating scale, medication(s)/side effects and non-pharmacologic comfort measures Outcome: Progressing   Problem: Clinical Measurements: Goal: Ability to maintain clinical measurements within normal limits will improve Outcome: Progressing Goal: Diagnostic test results will improve Outcome: Progressing Goal: Respiratory complications will improve Outcome: Progressing Goal: Cardiovascular complication will be avoided Outcome: Progressing   Problem: Activity: Goal: Risk for activity intolerance will decrease Outcome: Progressing   

## 2021-12-08 NOTE — Anesthesia Preprocedure Evaluation (Signed)
Anesthesia Evaluation  ?Patient identified by MRN, date of birth, ID band ?Patient awake ? ? ? ?Reviewed: ?Allergy & Precautions, NPO status , Patient's Chart, lab work & pertinent test results ? ?Airway ?Mallampati: II ? ?TM Distance: >3 FB ?Neck ROM: Full ? ? ? Dental ?no notable dental hx. ? ?  ?Pulmonary ?neg pulmonary ROS, former smoker,  ?  ?Pulmonary exam normal ? ? ? ? ? ? ? Cardiovascular ? ?Rhythm:Regular Rate:Normal ? ?Bacteremia  ?  ?Neuro/Psych ?negative neurological ROS ? negative psych ROS  ? GI/Hepatic ?negative GI ROS, Neg liver ROS,   ?Endo/Other  ?diabetes, Type 2, Insulin Dependent ? Renal/GU ?  ?negative genitourinary ?  ?Musculoskeletal ? ?(+) Arthritis , Osteoarthritis,   ? Abdominal ?Normal abdominal exam  (+)   ?Peds ? Hematology ?negative hematology ROS ?(+)   ?Anesthesia Other Findings ? ? Reproductive/Obstetrics ? ?  ? ? ? ? ? ? ? ? ? ? ? ? ? ?  ?  ? ? ? ? ? ? ? ? ?Anesthesia Physical ?Anesthesia Plan ? ?ASA: 2 ? ?Anesthesia Plan: MAC  ? ?Post-op Pain Management:   ? ?Induction: Intravenous ? ?PONV Risk Score and Plan: 1 and Propofol infusion and Treatment may vary due to age or medical condition ? ?Airway Management Planned: Simple Face Mask, Natural Airway and Nasal Cannula ? ?Additional Equipment: None ? ?Intra-op Plan:  ? ?Post-operative Plan:  ? ?Informed Consent: I have reviewed the patients History and Physical, chart, labs and discussed the procedure including the risks, benefits and alternatives for the proposed anesthesia with the patient or authorized representative who has indicated his/her understanding and acceptance.  ? ? ? ?Dental advisory given ? ?Plan Discussed with: CRNA ? ?Anesthesia Plan Comments:   ? ? ? ? ? ? ?Anesthesia Quick Evaluation ? ?

## 2021-12-08 NOTE — Interval H&P Note (Signed)
History and Physical Interval Note: ? ?12/08/2021 ?1:16 PM ? ?David Strong  has presented today for surgery, with the diagnosis of BACTERIMIA.  The various methods of treatment have been discussed with the patient and family. After consideration of risks, benefits and other options for treatment, the patient has consented to  Procedure(s): ?TRANSESOPHAGEAL ECHOCARDIOGRAM (TEE) (N/A) as a surgical intervention.  The patient's history has been reviewed, patient examined, no change in status, stable for surgery.  I have reviewed the patient's chart and labs.  Questions were answered to the patient's satisfaction.   ? ? ?[name] ? ? ?

## 2021-12-08 NOTE — Progress Notes (Signed)
ANTICOAGULATION CONSULT NOTE - Follow Up Consult ? ?Pharmacy Consult for Heparin ?Indication: DVT ? ?Allergies  ?Allergen Reactions  ? Codeine Itching  ? Glipizide Er [Glipizide]   ?  SOB, malaise. He tolerates XL without issues.  ? Metformin And Related Rash  ? ? ?Patient Measurements: ?Height: 5\' 4"  (162.6 cm) ?Weight: 80.6 kg (177 lb 11.1 oz) ?IBW/kg (Calculated) : 59.2 ?Heparin Dosing Weight:  77 kg ? ?Vital Signs: ?Temp: 98.3 ?F (36.8 ?C) (04/21 [id number]) ?Temp Source: Oral (04/21 [id number]) ?BP: 119/77 (04/21 0800) ?Pulse Rate: 90 (04/21 0800) ? ?Labs: ?Recent Labs  ?  12/06/21 ?0410 12/06/21 ?1045 12/06/21 ?1822 12/07/21 ?0530 12/08/21 ?0045 12/08/21 ?0749  ?HGB 10.0*  --   --  9.3* 9.1*  --   ?HCT 30.3*  --   --  28.9* 27.5*  --   ?PLT 344  --   --  337 270  --   ?HEPARINUNFRC 0.32   < > <0.10* 0.36  --  0.22*  ?CREATININE 0.64  --   --  0.63 0.60*  --   ? < > = values in this interval not displayed.  ? ? ? ?Estimated Creatinine Clearance: 88.3 mL/min (A) (by C-G formula based on SCr of 0.6 mg/dL (L)). ? ? ?Medical History: ?Past Medical History:  ?Diagnosis Date  ? Diabetes mellitus without complication ([place])   ? ? ?Assessment: ?66 yo male with D-dimer 5.37.  CT chest negative for PE. Found to have new L DVT. Thrombocytopenia appears chronic. ? ?Patient was changed from heparin drip to appixaban 4/10.  Apixaban held for debridement 4/16 and heparin now ordered as bridge while apixaban on hold  ?S/p OR for debridement and VAC change 4/20.  ? ?Heparin level of 0.22 just below goal on heparin 1600 units/hr. Hgb 9.1. Plt wnl. No bleeding noted. Per RN IV was paused for a few minutes overnight but should not have been enough to influence level. ? ?Goal of Therapy:  ?Heparin level 0.3-0.5  ?Monitor platelets by anticoagulation protocol: Yes ?  ?Plan:  ?Increase IV heparin to 1700 units/hr. ?Repeat heparin level in 8 hrs. ?Monitor daily heparin level, CBC and s/s of bleeding  ? ? ?[place] Crane, Pharm D, BCPS,  BCCP ?Clinical Pharmacist ? 12/08/2021 10:16 AM  ? ?[facility] pharmacy phone numbers are listed on [email or web address] ? ? ?

## 2021-12-08 NOTE — Anesthesia Postprocedure Evaluation (Signed)
Anesthesia Post Note ? ?Patient: David Strong ? ?Procedure(s) Performed: TRANSESOPHAGEAL ECHOCARDIOGRAM (TEE) ? ?  ? ?Patient location during evaluation: Endoscopy ?Anesthesia Type: MAC ?Level of consciousness: awake and alert ?Pain management: pain level controlled ?Vital Signs Assessment: post-procedure vital signs reviewed and stable ?Respiratory status: spontaneous breathing, nonlabored ventilation, respiratory function stable and patient connected to nasal cannula oxygen ?Cardiovascular status: stable and blood pressure returned to baseline ?Postop Assessment: no apparent nausea or vomiting ?Anesthetic complications: no ? ? ?No notable events documented. ? ?Last Vitals:  ?Vitals:  ? 12/08/21 1406 12/08/21 1421  ?BP: 111/71 119/79  ?Pulse: 89 91  ?Resp: 19 13  ?Temp: 37.2 ?C 37.2 ?C  ?SpO2: 95% 99%  ?  ?Last Pain:  ?Vitals:  ? 12/08/21 1421  ?TempSrc:   ?PainSc: 0-No pain  ? ? ?  ?  ?  ?  ?  ?  ? ?David Strong ? ? ? ? ?

## 2021-12-08 NOTE — Progress Notes (Addendum)
? ?   ?[address] ?      [name] [address] ?            [phone]   ? ?  1 Day Post-Op Procedure(s) (LRB): ?IRRIGATION AND DEBRIDEMENT ABSCESS (Left) ?WOUND VAC CHANGE (Left) ?Subjective: ?Feels ok, concerned about edema ? ?Objective: ?Vital signs in last 24 hours: ?Temp:  [97.9 ?F (36.6 ?C)-99.2 ?F (37.3 ?C)] 98.3 ?F (36.8 ?C) (04/21 8466) ?Pulse Rate:  [82-111] 91 (04/21 0722) ?Cardiac Rhythm: Sinus tachycardia (04/20 1929) ?Resp:  [11-23] 21 (04/21 5993) ?BP: (103-138)/(65-94) 116/80 (04/21 5701) ?SpO2:  [92 %-100 %] 96 % (04/21 0722) ?Weight:  [80.6 kg] 80.6 kg (04/21 0357) ? ?Hemodynamic parameters for last 24 hours: ?  ? ?Intake/Output from previous day: ?04/20 0701 - 04/21 0700 ?In: 4805.3 [P.O.:400; I.V.:2319.6; IV Piggyback:2085.7] ?Out: 3945 [Urine:3650; Drains:75; Stool:200; Blood:20] ?Intake/Output this shift: ?No intake/output data recorded. ? ?General appearance: alert, cooperative, and no distress ?Heart: regular rate and rhythm ?Lungs: clear to auscultation bilaterally ?Abdomen: soft, non tender ?Extremities: + BLE and right hand edema ?Wound: VAC in place ? ?Lab Results: ?Recent Labs  ?  12/07/21 ?0530 12/08/21 ?0045  ?WBC 5.1 5.3  ?HGB 9.3* 9.1*  ?HCT 28.9* 27.5*  ?PLT 337 270  ? ?BMET:  ?Recent Labs  ?  12/07/21 ?0530 12/08/21 ?0045  ?NA 132* 132*  ?K 3.5 3.7  ?CL 99 100  ?CO2 26 29  ?GLUCOSE 160* 201*  ?BUN <5* <5*  ?CREATININE 0.63 0.60*  ?CALCIUM 7.7* 7.6*  ?  ?PT/INR: No results for input(s): LABPROT, INR in the last 72 hours. ?ABG ?No results found for: PHART, HCO3, TCO2, ACIDBASEDEF, O2SAT ?CBG (last 3)  ?Recent Labs  ?  12/07/21 ?1529 12/07/21 ?2105 12/08/21 ?7793  ?GLUCAP 133* 167* 132*  ? ? ?Meds ?Scheduled Meds: ? acetaminophen  1,000 mg Oral Q6H  ? Or  ? acetaminophen (TYLENOL) oral liquid 160 mg/5 mL  1,000 mg Oral Q6H  ? bisacodyl  10 mg Oral Daily  ? Chlorhexidine Gluconate Cloth  6 each Topical Daily  ? insulin aspart  0-15 Units Subcutaneous TID WC  ?  insulin aspart  0-5 Units Subcutaneous QHS  ? insulin aspart  8 Units Subcutaneous TID WC  ? insulin glargine-yfgn  40 Units Subcutaneous Daily  ? lidocaine  1 patch Transdermal Q24H  ? multivitamin with minerals  1 tablet Oral Daily  ? polyethylene glycol  17 g Oral BID  ? potassium chloride  40 mEq Oral BID  ? Ensure Max Protein  11 oz Oral BID  ? senna-docusate  1 tablet Oral QHS  ? senna-docusate  2 tablet Oral Daily  ? simethicone  80 mg Oral QID  ? ?Continuous Infusions: ? sodium chloride 75 mL/hr at 12/08/21 9030  ? sodium chloride    ? heparin 1,600 Units/hr (12/08/21 0923)  ? penicillin g continuous IV infusion 12 Million Units (12/08/21 3007)  ? ?PRN Meds:.acetaminophen **OR** [DISCONTINUED] acetaminophen, alum & mag hydroxide-simeth, benzonatate, bisacodyl, fentaNYL (SUBLIMAZE) injection, hydrALAZINE, methocarbamol, naLOXone (NARCAN)  injection, ondansetron (ZOFRAN) IV, oxyCODONE, oxyCODONE, sodium chloride flush ? ? ?Recent Results (from the past 240 hour(s))  ?Aerobic/Anaerobic Culture w Gram Stain (surgical/deep wound)     Status: None (Preliminary result)  ? Collection Time: 12/04/21  2:41 PM  ? Specimen: PATH Other; Body Fluid  ?Result Value Ref Range Status  ? Specimen Description FLUID  Final  ? Special Requests LEFT sTERNOCLAVICULAR JOINT SPEC B  Final  ? Gram Stain  Final  ?  FEW WBC PRESENT,BOTH PMN AND MONONUCLEAR ?NO ORGANISMS SEEN ?  ? Culture   Final  ?  NO GROWTH 3 DAYS NO ANAEROBES ISOLATED; CULTURE IN PROGRESS FOR 5 DAYS ?Performed at [facility], [address], [place], [place] [address] ?  ? Report Status PENDING  Incomplete  ?Fungus Culture With Stain     Status: None (Preliminary result)  ? Collection Time: 12/04/21  2:43 PM  ? Specimen: PATH Other; Body Fluid  ?Result Value Ref Range Status  ? Fungus Stain Final report  Final  ?  Comment: (NOTE) ?Performed At: [facility] ?[address] [place], [place] [id number] ?Jolene Schimke MD [id number]:[id number] ?  ? Fungus  (Mycology) Culture PENDING  Incomplete  ? Fungal Source ABSCESS  Final  ?  Comment: LEFT STERNOCLAVICULAR JOINT ?Performed at [facility], [address] [place], [place] [address] ?  ?Aerobic/Anaerobic Culture w Gram Stain (surgical/deep wound)     Status: None (Preliminary result)  ? Collection Time: 12/04/21  2:43 PM  ? Specimen: PATH Other; Body Fluid  ?Result Value Ref Range Status  ? Specimen Description ABSCESS  Final  ? Special Requests LEFT STERNOCLAVICULAR JOINT SWAB SPEC A  Final  ? Gram Stain   Final  ?  FEW WBC PRESENT, PREDOMINANTLY PMN ?NO ORGANISMS SEEN ?  ? Culture   Final  ?  NO GROWTH 3 DAYS NO ANAEROBES ISOLATED; CULTURE IN PROGRESS FOR 5 DAYS ?Performed at [facility], [address] [place], [place] [address] ?  ? Report Status PENDING  Incomplete  ?Fungus Culture Result     Status: None  ? Collection Time: 12/04/21  2:43 PM  ?Result Value Ref Range Status  ? Result 1 Comment  Final  ?  Comment: (NOTE) ?KOH/Calcofluor preparation:  no fungus observed. ?Performed At: [facility] ?[address] [place], [place] [id number] ?Jolene Schimke MD [id number] ?  ?Aerobic/Anaerobic Culture w Gram Stain (surgical/deep wound)     Status: None (Preliminary result)  ? Collection Time: 12/07/21 12:26 PM  ? Specimen: PATH Other; Tissue  ?Result Value Ref Range Status  ? Specimen Description ASCITIC  Final  ? Special Requests STERNOVACULIAR JOINT WOUND SPEC A  Final  ? Gram Stain NO WBC SEEN ?NO ORGANISMS SEEN ?  Final  ? Culture   Final  ?  NO GROWTH < 24 HOURS ?Performed at [facility], [address] [place], [place] [address] ?  ? Report Status PENDING  Incomplete  ?  ?Xrays ?No results found. ? ?Assessment/Plan: ?S/P Procedure(s) (LRB): ?IRRIGATION AND DEBRIDEMENT ABSCESS (Left) ?WOUND VAC CHANGE (Left) ? ? ?1 afeb, VSS, SR, Sinus tachy, some PVC's ?2 sats ood on RA ?3 excellent UOP ?4 no leukocytosis, VAC in place plan for return to OR next Monday for change/debridement  as needed , conts Pen G for strep ?5 plans for TEE today ?6 Medical management per hospitalist as they have outlined ? LOS: 16 days  ? ? ?Rowe Clack PA-C ?Pager [phone] ?12/08/2021 ?  ?Patient seen and examined, agree with above ?TEE showed no signs of endocarditis ?Plan dressing change Monday ? ?Salvatore Decent Dorris Fetch, MD ?[facility] ?[phone] ? ?

## 2021-12-08 NOTE — Progress Notes (Signed)
PT Cancellation Note ? ?Patient Details ?Name: David Strong ?MRN: [id number] ?DOB: 09-28-1955 ? ? ?Cancelled Treatment:    Reason Eval/Treat Not Completed: (P) Patient at procedure or test/unavailable (Pt off unit for TEE + MRI.) Will continue efforts per PT plan of care as schedule permits.  ?Per CT surgery, no specific UE restrictions after recent procedures. ? ? ?Leslie Langille M Dyshawn Cangelosi ?12/08/2021, 1:32 PM ? ? ?

## 2021-12-08 NOTE — Progress Notes (Signed)
Physical Therapy Treatment ?Patient Details ?Name: David Strong ?MRN: [id number] ?DOB: 1955-11-23 ?Today's Date: 12/08/2021 ? ? ?History of Present Illness Pt. is a 66 y.o. M presenting to [facility] 4/5 Admitted for severe sepsis, POA due to streptococcal pneumonia with bacteremia, left sternoclavicular joint septic arthritis and other large joint arthralgia/arthritis (right shoulder, left knee). Pt underwent I&D of Dyer joint with wound vac placement on 4/17. PMH significant for T2DM.  CTA showed no PE, confirmed multifocal pneumonia and MRI shows left sternoclavicular joint inflammatory changes with scattered foci of air within the soft tissues and sternoclavicular joint space compatible with localized left sternoclavicular septic arthritis/cellulitis, without evidence of drainable fluid collection or large joint effusion.  Subsequent BCID and urine antigen positive for strep.  Antibiotics narrowed to IV ceftriaxone.  IR ultrasound shows +RUE superficial thrombosis and L LE DVT. ? ?  ?PT Comments  ? ? Pt received in supine, deferring gait trial due to fatigue and urinary urgency after taking medicine but agreeable to supine UE/LE exercises for strengthening and pivotal transfer to [facility]. Once on [facility], pt needing increased time for toileting and requesting assist to bathe/wash up afterward, RN/NT notified pt able to demo back use of call bell. Pt continues to benefit from PT services to progress toward functional mobility goals.     ?Recommendations for follow up therapy are one component of a multi-disciplinary discharge planning process, led by the attending physician.  Recommendations may be updated based on patient status, additional functional criteria and insurance authorization. ? ?Follow Up Recommendations ? Home health PT ?  ?  ?Assistance Recommended at Discharge Intermittent Supervision/Assistance  ?Patient can return home with the following A little help with walking and/or transfers;A little help with  bathing/dressing/bathroom;Assistance with cooking/housework;Assist for transportation;Help with stairs or ramp for entrance ?  ?Equipment Recommendations ? Other (comment) (TBA)  ?  ?Recommendations for Other Services   ? ? ?  ?Precautions / Restrictions Precautions ?Precautions: Fall;Other (comment) ?Precaution Comments: RUE DVT, wound vac ?Restrictions ?Weight Bearing Restrictions: No ?Other Position/Activity Restrictions: 4/21 for CT surgery PA no WB restrictions for UE  ?  ? ?Mobility ? Bed Mobility ?Overal bed mobility: Needs Assistance ?Bed Mobility: Supine to Sit ?  ?  ?Supine to sit: Min guard ?  ?  ?General bed mobility comments: good use of bed features/rail, HOB flat per home setup ?  ? ?Transfers ?Overall transfer level: Needs assistance ?Equipment used: None ?Transfers: Sit to/from Stand, Bed to chair/wheelchair/BSC ?Sit to Stand: Supervision ?  ?Step pivot transfers: Min assist ?  ?  ?  ?General transfer comment: for safety, cues for hand placement during power up; minA for stability due to minor LOB pivoting to [facility] without AD ?  ? ?Ambulation/Gait ?  ?  ?  ?  ?  ?  ?Pre-gait activities: pt defers gait, wanting to use BSC and recently took lasix ?  ? ? ?  ?Balance Overall balance assessment: Needs assistance ?Sitting-balance support: Feet supported ?Sitting balance-Leahy Scale: Good ?  ?  ?Standing balance support: No upper extremity supported, During functional activity ?Standing balance-Leahy Scale: Poor ?Standing balance comment: mild LOB pivoting to [facility] without AD ?  ?  ?  ?  ?  ?  ?  ?  ?  ?  ?  ?  ? ?  ?Cognition Arousal/Alertness: Awake/alert ?Behavior During Therapy: [facility] for tasks assessed/performed ?Overall Cognitive Status: No family/caregiver present to determine baseline cognitive functioning ?  ?  ?  ?  ?  ?  ?  ?  ?  ?  ?  ?  ?  ?  ?  ?  ?  General Comments: pt receptive to instruction this date, eager to move more and get swelling down, anxious due to recently taking lasix so defers  ambulation. ?  ?  ? ?  ?Exercises General Exercises - Lower Extremity ?Ankle Circles/Pumps: AROM, Both, 10 reps ?Short Arc Quad: AROM, Both, 5 reps, Supine ?Heel Slides: AROM, Both, 5 reps, Supine ?Hip ABduction/ADduction: AROM, Both, 10 reps, Supine ?Straight Leg Raises: AAROM, Both, 10 reps, Supine (quad lag bilaterally) ?Hip Flexion/Marching: AROM, Both, 10 reps, Supine ?Other Exercises ?Other Exercises: supine BUE AROM: shoulder flexion in pain-free range, elbow flex/ext, chest press, shoulder IR/ER with elbow 90* at ribcage x10 reps ea ? ?  ?General Comments General comments (skin integrity, edema, etc.): VSS on RA, wound vac intact ?  ?  ? ?Pertinent Vitals/Pain Pain Assessment ?Pain Assessment: Faces ?Faces Pain Scale: Hurts a little bit ?Pain Location: RUE, L shoulder ?Pain Descriptors / Indicators: Sore ?Pain Intervention(s): Monitored during session, Repositioned  ? ? ? ?PT Goals (current goals can now be found in the care plan section) Acute Rehab PT Goals ?Patient Stated Goal: less pain, to move better and get swelling down. ?PT Goal Formulation: With patient ?Time For Goal Achievement: 12/10/21 ?Progress towards PT goals: Progressing toward goals ? ?  ?Frequency ? ? ? Min 3X/week ? ? ? ?  ?PT Plan Current plan remains appropriate  ? ? ?   ?AM-PAC PT "6 Clicks" Mobility   ?Outcome Measure ? Help needed turning from your back to your side while in a flat bed without using bedrails?: None ?Help needed moving from lying on your back to sitting on the side of a flat bed without using bedrails?: A Little ?Help needed moving to and from a bed to a chair (including a wheelchair)?: A Little ?Help needed standing up from a chair using your arms (e.g., wheelchair or bedside chair)?: A Little ?Help needed to walk in hospital room?: A Lot ?Help needed climbing 3-5 steps with a railing? : A Lot ?6 Click Score: 17 ? ?  ?End of Session   ?Activity Tolerance: Patient limited by pain;Other (comment) (pt with frequent  urinary urgency due to recently taking lasix) ?Patient left: with call bell/phone within reach;Other (comment) (sitting on BSC wanting a bath) ?Nurse Communication: Mobility status;Other (comment) (pt will need help off BSC/needs assist with peri-care) ?PT Visit Diagnosis: Difficulty in walking, not elsewhere classified (R26.2);Pain ?Pain - part of body: Knee;Shoulder ?  ? ? ?Time: [id number] ?PT Time Calculation (min) (ACUTE ONLY): 17 min ? ?Charges:  $Therapeutic Exercise: 8-22 mins          ?          ? ?Cherolyn Behrle P., PTA ?Acute Rehabilitation Services ?Secure Chat Preferred 9a-5:30pm ?Office: [phone]  ? ? ?Kara Pacer Kunaal Walkins ?12/08/2021, 6:27 PM ? ?

## 2021-12-08 NOTE — Progress Notes (Signed)
? ? ? [facility] ?                                                                            ? ? ?David Strong, is a 66 y.o. male, DOB - 02-27-1956, [id number] ?Admit date - 11/22/2021    ?Outpatient Primary MD for the patient is David Honour Ines Bloomer, MD ? ?LOS - 16  days ? ? ? ?Brief summary  ? ?66 year old male with medical history of diabetes mellitus type 2 presented to the ED with complaints of productive cough, subjective fever, chills, myalgia, severe arthralgia, new onset erythema and swelling of the left side of the base of the neck.  CTA showed no PE, confirmed right middle lobe pneumonia and bibasilar airspace opacities consistent with multifocal pneumonia and left sternoclavicular joint inflammatory changes with scattered foci of air within the soft tissue and sternoclavicular joint space compatible with left localized sternoclavicular septic arthritis/cellulitis, without evidence of drainable fluid collection or large joint effusion.  Subsequent BC ID and urine antigen positive for strep.  Antibiotics were narrowed down to ceftriaxone.  Newly diagnosed right upper arm SVT and left lower extremity DVT, briefly on IV heparin transition to Eliquis . ?MRI right shoulder findings suspicious for septic arthritis involving AC joint and glenohumeral joint.  Severe myositis and pyomyositis involving the subscapularis muscle, the anterior deltoid muscle and distal aspect of pectoralis muscle.  There is also myositis involving the infraspinatus muscle.   ?  ?Patient was seen by orthopedic surgery, no fluid could be aspirated on joint aspiration from the right shoulder.  He was seen by infectious disease and TEE was recommended, however patient did not want to get inpatient TEE so outpatient TEE was scheduled.  ID recommended to discharge home on 6 weeks of penicillin.  Home health RN/PT was arranged for IV antibiotics.  Eliquis also was sent to the pharmacy. ?  ?Before discharge patient complained of  worsening swelling of right chest wall, crepitus was palpated with likely subluxation of left clavicle at the sternoclavicular joint.  CT chest with contrast was obtained which showed progressive anterior subluxation of the left sternoclavicular joint with new erosive changes with bony fragmentation along the medial articular surface of the proximal left clavicle.  Findings on 7 4 septic arthritis.  There was progressive fluid and less collection centered on the left supraclavicular joint which extended to the left side of the neck.  Also Vollman to the left pectoralis muscle containing foci of gas and fluid.  Findings consistent with abscess. ?  ?CVTS was consulted, Dr. Roxan Hockey saw the patient and recommended transfer to [facility] for incision and drainage. ?Pt underwent s/p I&D on 12/05/21 and on 12/07/21. He is scheduled for MRI of the left knee and TEE today.  ? ?  ?  ? ? ?Assessment & Plan  ? ? ?Assessment and Plan: ?* Severe sepsis ([place]) ?Due to streptococcal multifocal pneumonia with bacteremia, presumed left sternoclavicular joint septic arthritis left supraclavicular abscess and other polyarthralgia/arthritis (right shoulder, left knee) ?Met sepsis criteria on admission including persistent tachypnea, tachycardia and did spike a fever of 100.6 overnight of admission.  CTA chest confirmed multifocal pneumonia.   ?Empirically started on IV vancomycin,  ceftriaxone and azithromycin. ?BCID and urine positive for Streptococcus.  ID consulted and his antibiotics narrowed to penicillin G on 4/10 plan for 6 weeks of antibiotics till 01/03/2022 ?TTE: LVEF 60-65%.  No evidence of valvular vegetations.  Surveillance blood cultures 4/7: NTD ?TEE Scheduled for 12/08/21 as per ID. He was hesitant earlier today but finally agreed to get it done.  ? Outpatient follow up with ID.  ?Sepsis physiology has resolved but still with significant arthralgias/arthritis of above joints.   ?Pt was [facility] per cardiothoracic  surgery request and he underwent irrigation and debridement of the left sternoclavicular joint abscess on 12/04/21 and on 12/07/21. ?Cultures from the debridement are pending.  ? ? ?Septic arthritis ([place]) ?Left sternoclavicular joint septic arthritis ?Has other polyarthralgias/arthritis (right shoulder and left knee) ?See above. Left ankle swelling and left knee swelling with some cellulitis changes. Joint aspiration was attempted however no fluid obtained.   ?MRI of the left knee is pending.  ?Patient concerned about ankle edema, pt denies any pain in the ankles. Left knee swollen and tender and MRI is pending.  ? ? ?DVT (deep venous thrombosis) ([place]) ? ?Left lower extremity: Age-indeterminate DVT left popliteal vein, acute DVT left gastrocnemius and left posterior tibial veins. ?Currently on IV heparin, transition to eliquis later today.  ?Due to complaint of bilateral leg pain and swelling, obtained RLE venous Dopplers which were negative for DVT or SVT. ?Hematology consulted for further evaluation in view of hypoalbuminemia, DVT.  ?Hematology suggested at least 3 months of anti coagulation and outpatient follow up with hematology for anti coagulopathy work up.  ? ?Transaminitis ?Mild and likely related to severe sepsis. ?No GI symptoms. ?RUQ ultrasound with gallbladder sludge and no acute findings. ?Continue to trend daily CMP. ? ?High anion gap metabolic acidosis ?Lactic acidosis ?Secondary to severe sepsis. ?Bicarbonate normal.  Anion gap has normalized. ?Resolved. ? ?Thrombocytopenia ([place]) ?Suspect chronic or acute on chronic, probably from sepsis.  ?Platelet counts in 2018: 104 ?Resolved. ? ?Type 2 diabetes mellitus ([place]) ?A1c 8 on 10/12/2021. ?On glipizide and dapagliflozin at home.  Currently held. ?CBGs uncontrolled and fluctuating.   ? ?Recent Labs  ?  12/07/21 ?1529 12/07/21 ?2105 12/08/21 ?7564  ?GLUCAP 133* 167* 132*  ? ? ?Resume Semglee and SSI. No changes in meds.  ? ?Hypokalemia ?Replaced.    ? ?Hypophosphatemia ?Replaced. ? ?Bilateral leg edema ?Likely due to IV fluids early on in admission and hypoalbuminemic state. ?Also has left lower extremity DVT. ? ?Constipation ?Aggressive bowel regimen. ?BMs documented. ? ? ? ?Hypertension:  ?Well controlled BP parameters.  ? ? ?Hyponatremia:  ?Mild , stable.  ?  ? ?Acute anemia of blood loss probably from debridement:  ?Anemia of acute illness.  ?His hemoglobin was normal on admission around 14.5, dropped to 9 and has been stable around 9.  ?Continue to monitor.  ? ?  ? ?Malnutrition Type: ? ?Nutrition Problem: Increased nutrient needs ?Etiology: acute illness ? ? ?Malnutrition Characteristics: ? ?Signs/Symptoms: estimated needs ? ? ?Nutrition Interventions: ? ?Interventions: MVI, Premier Protein, Other (Comment) (double protein) ? ?Estimated body mass index is 30.5 kg/m? as calculated from the following: ?  Height as of this encounter: '5\' 4"'  (1.626 m). ?  Weight as of this encounter: 80.6 kg. ? ?Code Status: Full code.  ?DVT Prophylaxis:  SCD's Start: 12/04/21 1836 ?SCDs Start: 11/22/21 1920 ? ? ?Level of Care: Level of care: Progressive ?Family Communication: None at bedside.  ? ?Disposition Plan:     Remains  inpatient appropriate:  INCISION AND DEBRIDEMENT ? ?Procedures:  ?IRRIGATION AND DEBRIDMENT OF STERNOCLAVICULAR WOUND, WOUND VAC PLACEMENT on the left.[place] ?Consultants:   ?Cardiothoracic surgery.  ? ?Antimicrobials:  ? ?Anti-infectives (From admission, onward)  ? ? Start     Dose/Rate Route Frequency Ordered Stop  ? 12/04/21 1930  penicillin G potassium 12 Million Units in dextrose 5 % 500 mL continuous infusion  Status:  Discontinued       ? 12 Million Units ?41.7 mL/hr over 12 Hours Intravenous Every 12 hours 12/04/21 1835 12/04/21 1928  ? 12/04/21 1000  vancomycin (VANCOCIN) IVPB 1000 mg/200 mL premix       ? 1,000 mg ?200 mL/hr over 60 Minutes Intravenous On call to O.R. 12/04/21 0958 12/04/21 1443  ? 12/01/21 0000  penicillin G IVPB       ? 24  Million Units Intravenous Every 24 hours 12/01/21 1303 01/03/22 2359  ? 11/27/21 1000  penicillin G potassium 12 Million Units in dextrose 5 % 500 mL continuous infusion       ? 12 Million Units ?41.7 mL/hr over 12

## 2021-12-08 NOTE — Progress Notes (Signed)
PHARMACY CONSULT NOTE FOR: ? ?OUTPATIENT  PARENTERAL ANTIBIOTIC THERAPY (OPAT) ? ?Indication: Strep pneumo bacteremia/septic arthritis ?Regimen: Penicillin G 24 million units every 24 hours as a continuous infusion ?End date: 01/18/22 (adjusted from original plan since pt went back to OR on 12/07/21). ? ?IV antibiotic discharge orders are pended. ?To discharging provider:  please sign these orders via discharge navigator,  ?Select New Orders & click on the button choice - Manage This Unsigned Work.  ?  ? ?Thank you for allowing pharmacy to be a part of this patient's care. ? ?Reece Leader, Pharm D, BCPS, BCCP ?Clinical Pharmacist ? 12/08/2021 10:09 AM  ? ?[facility] pharmacy phone numbers are listed on [email or web address] ? ? ?

## 2021-12-08 NOTE — Transfer of Care (Signed)
Immediate Anesthesia Transfer of Care Note ? ?Patient: David Strong ? ?Procedure(s) Performed: TRANSESOPHAGEAL ECHOCARDIOGRAM (TEE) ? ?Patient Location: PACU ? ?Anesthesia Type:MAC ? ?Level of Consciousness: drowsy ? ?Airway & Oxygen Therapy: Patient Spontanous Breathing ? ?Post-op Assessment: Report given to RN and Post -op Vital signs reviewed and stable ? ?Post vital signs: Reviewed and stable ? ?Last Vitals:  ?Vitals Value Taken Time  ?BP    ?Temp    ?Pulse 89 12/08/21 1405  ?Resp 19 12/08/21 1405  ?SpO2 95 % 12/08/21 1405  ?Vitals shown include unvalidated device data. ? ?Last Pain:  ?Vitals:  ? 12/08/21 1308  ?TempSrc: Temporal  ?PainSc: 0-No pain  ?   ? ?Patients Stated Pain Goal: 2 (12/07/21 2300) ? ?Complications: No notable events documented. ?

## 2021-12-08 NOTE — Plan of Care (Signed)
Discussed with patient plan of care for the evening, pain management and importance of call bell with some teach back displayed.  Also, straightened room with daughter present and situated patient in the room with pillows for comfort as well.  Patient still wants to talk to MD before signing TEE procedure consent.  First shift report states MD Aware. ? ?Problem: Education: ?Goal: Knowledge of General Education information will improve ?Description: Including pain rating scale, medication(s)/side effects and non-pharmacologic comfort measures ?Outcome: Progressing ?  ?Problem: Health Behavior/Discharge Planning: ?Goal: Ability to manage health-related needs will improve ?Outcome: Progressing ?  ?

## 2021-12-08 NOTE — Anesthesia Postprocedure Evaluation (Signed)
Anesthesia Post Note ? ?Patient: David Strong ? ?Procedure(s) Performed: IRRIGATION AND DEBRIDEMENT ABSCESS (Left: Chest) ?WOUND VAC CHANGE (Left: Chest) ? ?  ? ?Patient location during evaluation: PACU ?Anesthesia Type: General ?Level of consciousness: sedated and patient cooperative ?Pain management: pain level controlled ?Vital Signs Assessment: post-procedure vital signs reviewed and stable ?Respiratory status: spontaneous breathing ?Cardiovascular status: stable ?Anesthetic complications: no ? ? ?No notable events documented. ? ?Last Vitals:  ?Vitals:  ? 12/08/21 0722 12/08/21 0800  ?BP: 116/80 119/77  ?Pulse: 91 90  ?Resp: (!) 21 13  ?Temp: 36.8 ?C   ?SpO2: 96% 94%  ?  ?Last Pain:  ?Vitals:  ? 12/08/21 0800  ?TempSrc:   ?PainSc: 0-No pain  ? ? ?  ?  ?  ?  ?  ?  ? ?David Strong ? ? ? ? ?

## 2021-12-08 NOTE — Progress Notes (Signed)
ANTICOAGULATION CONSULT NOTE - Follow Up Consult ? ?Pharmacy Consult for Heparin ?Indication: DVT ? ?Allergies  ?Allergen Reactions  ? Codeine Itching  ? Glipizide Er [Glipizide]   ?  SOB, malaise. He tolerates XL without issues.  ? Metformin And Related Rash  ? ? ?Patient Measurements: ?Height: 5\' 4"  (162.6 cm) ?Weight: 80.6 kg (177 lb 11.1 oz) ?IBW/kg (Calculated) : 59.2 ?Heparin Dosing Weight:  77 kg ? ?Vital Signs: ?Temp: 98.1 ?F (36.7 ?C) (04/21 1917) ?Temp Source: Oral (04/21 1917) ?BP: 133/77 (04/21 1917) ?Pulse Rate: 99 (04/21 1917) ? ?Labs: ?Recent Labs  ?  12/06/21 ?0410 12/06/21 ?1045 12/07/21 ?0530 12/08/21 ?0045 12/08/21 ?12/10/21 12/08/21 ?1943  ?HGB 10.0*  --  9.3* 9.1*  --   --   ?HCT 30.3*  --  28.9* 27.5*  --   --   ?PLT 344  --  337 270  --   --   ?HEPARINUNFRC 0.32   < > 0.36  --  0.22* 0.56  ?CREATININE 0.64  --  0.63 0.60*  --   --   ? < > = values in this interval not displayed.  ? ? ? ?Estimated Creatinine Clearance: 88.3 mL/min (A) (by C-G formula based on SCr of 0.6 mg/dL (L)). ? ? ?Medical History: ?Past Medical History:  ?Diagnosis Date  ? Diabetes mellitus without complication (HCC)   ? ? ?Assessment: ?66 yo male with D-dimer 5.37.  CT chest negative for PE. Found to have new L DVT. Thrombocytopenia appears chronic. ? ?Patient was changed from heparin drip to appixaban 4/10.  Apixaban held for debridement 4/16 and heparin now ordered as bridge while apixaban on hold  ?S/p OR for debridement and VAC change 4/20.  ? ?Heparin level of 0.56 at goal after heparin drip increased heparin 1700 units/hr. Hgb 9.1. Plt wnl. No bleeding noted.  ? ?Goal of Therapy:  ?Heparin level 0.3-0.5  ?Monitor platelets by anticoagulation protocol: Yes ?  ?Plan:  ?Continue IV heparin to 1700 units/hr. ?Monitor daily heparin level, CBC and s/s of bleeding  ? ? ?5/20 Pharm.D. CPP, BCPS ?Clinical Pharmacist ?[phone] ?12/08/2021 9:17 PM  ?  ? ?[facility] pharmacy phone numbers are listed on [email or web address] ? ? ?

## 2021-12-08 NOTE — CV Procedure (Signed)
? ? ?  TRANSESOPHAGEAL ECHOCARDIOGRAM  ? ?NAME:  Jernard Hickle    ?MRN: [id number] ?DOB:  September 26, 1955    ?ADMIT DATE: 11/22/2021 ? ?INDICATIONS: ?Bacteremia ? ?PROCEDURE:  ? ?Informed consent was obtained prior to the procedure. The risks, benefits and alternatives for the procedure were discussed and the patient comprehended these risks.  Risks include, but are not limited to, cough, sore throat, vomiting, nausea, somnolence, esophageal and stomach trauma or perforation, bleeding, low blood pressure, aspiration, pneumonia, infection, trauma to the teeth and death.   ? ?Procedural time out performed. The oropharynx was anesthetized with topical 1% benzocaine.   ? ?Anesthesia was administered by Dr. Curt Jews and team.  The patient was administered a total of Propofol 173 mg to achieve and maintain moderate to deep conscious sedation.  The patient's heart rate, blood pressure, and oxygen saturation are monitored continuously during the procedure. The period of conscious sedation is 8 minutes, of which I was present face-to-face 100% of this time.  ? ?The transesophageal probe was inserted in the esophagus and stomach without difficulty and multiple views were obtained.  ? ?COMPLICATIONS:   ? ?There were no immediate complications. ? ?KEY FINDINGS: ? ?No evidence of infective endocarditis.  ?Full report to follow. ?Further management per primary team.  ? ?Riley Lam, MD ?[place]  [facility]  ?1:58 PM ? ? ?

## 2021-12-09 ENCOUNTER — Inpatient Hospital Stay (HOSPITAL_COMMUNITY): Payer: Medicare Other

## 2021-12-09 DIAGNOSIS — R6520 Severe sepsis without septic shock: Secondary | ICD-10-CM | POA: Diagnosis not present

## 2021-12-09 DIAGNOSIS — J189 Pneumonia, unspecified organism: Secondary | ICD-10-CM | POA: Diagnosis not present

## 2021-12-09 DIAGNOSIS — A419 Sepsis, unspecified organism: Secondary | ICD-10-CM | POA: Diagnosis not present

## 2021-12-09 LAB — COMPREHENSIVE METABOLIC PANEL
ALT: 29 U/L (ref 0–44)
AST: 32 U/L (ref 15–41)
Albumin: <1.5 g/dL — ABNORMAL LOW (ref 3.5–5.0)
Alkaline Phosphatase: 177 U/L — ABNORMAL HIGH (ref 38–126)
Anion gap: 6 (ref 5–15)
BUN: <5 mg/dL — ABNORMAL LOW (ref 8–23)
CO2: 28 mmol/L (ref 22–32)
Calcium: 7.7 mg/dL — ABNORMAL LOW (ref 8.9–10.3)
Chloride: 100 mmol/L (ref 98–111)
Creatinine, Ser: 0.62 mg/dL (ref 0.61–1.24)
GFR, Estimated: >60 mL/min (ref >60)
Glucose, Bld: 156 mg/dL — ABNORMAL HIGH (ref 70–99)
Potassium: 3.5 mmol/L (ref 3.5–5.1)
Sodium: 134 mmol/L — ABNORMAL LOW (ref 135–145)
Total Bilirubin: 0.5 mg/dL (ref 0.3–1.2)
Total Protein: 5.3 g/dL — ABNORMAL LOW (ref 6.5–8.1)

## 2021-12-09 LAB — AEROBIC/ANAEROBIC CULTURE W GRAM STAIN (SURGICAL/DEEP WOUND)
Culture: NO GROWTH
Culture: NO GROWTH

## 2021-12-09 LAB — CBC WITH DIFFERENTIAL/PLATELET
Abs Immature Granulocytes: 0.03 10*3/uL (ref 0.00–0.07)
Basophils Absolute: 0 10*3/uL (ref 0.0–0.1)
Basophils Relative: 0 %
Eosinophils Absolute: 0.1 10*3/uL (ref 0.0–0.5)
Eosinophils Relative: 2 %
HCT: 27.3 % — ABNORMAL LOW (ref 39.0–52.0)
Hemoglobin: 9 g/dL — ABNORMAL LOW (ref 13.0–17.0)
Immature Granulocytes: 1 %
Lymphocytes Relative: 27 %
Lymphs Abs: 1.2 10*3/uL (ref 0.7–4.0)
MCH: 30 pg (ref 26.0–34.0)
MCHC: 33 g/dL (ref 30.0–36.0)
MCV: 91 fL (ref 80.0–100.0)
Monocytes Absolute: 0.4 10*3/uL (ref 0.1–1.0)
Monocytes Relative: 9 %
Neutro Abs: 2.7 10*3/uL (ref 1.7–7.7)
Neutrophils Relative %: 61 %
Platelets: 261 10*3/uL (ref 150–400)
RBC: 3 MIL/uL — ABNORMAL LOW (ref 4.22–5.81)
RDW: 13.5 % (ref 11.5–15.5)
WBC: 4.5 10*3/uL (ref 4.0–10.5)
nRBC: 0 % (ref 0.0–0.2)

## 2021-12-09 LAB — GLUCOSE, CAPILLARY
Glucose-Capillary: 160 mg/dL — ABNORMAL HIGH (ref 70–99)
Glucose-Capillary: 152 mg/dL — ABNORMAL HIGH (ref 70–99)
Glucose-Capillary: 71 mg/dL (ref 70–99)
Glucose-Capillary: 131 mg/dL — ABNORMAL HIGH (ref 70–99)

## 2021-12-09 LAB — HEPARIN LEVEL (UNFRACTIONATED): Heparin Unfractionated: 0.52 IU/mL (ref 0.30–0.70)

## 2021-12-09 LAB — MAGNESIUM: Magnesium: 1.6 mg/dL — ABNORMAL LOW (ref 1.7–2.4)

## 2021-12-09 LAB — PHOSPHORUS: Phosphorus: 3.4 mg/dL (ref 2.5–4.6)

## 2021-12-09 MED ORDER — POTASSIUM CHLORIDE CRYS ER 20 MEQ PO TBCR
40.0000 meq | EXTENDED_RELEASE_TABLET | Freq: Once | ORAL | Status: AC
  Administered 2021-12-09: 40 meq via ORAL
  Filled 2021-12-09: qty 2

## 2021-12-09 MED ORDER — GADOBUTROL 1 MMOL/ML IV SOLN
8.0000 mL | Freq: Once | INTRAVENOUS | Status: AC | PRN
  Administered 2021-12-09: 8 mL via INTRAVENOUS

## 2021-12-09 MED ORDER — FUROSEMIDE 20 MG PO TABS
20.0000 mg | ORAL_TABLET | Freq: Once | ORAL | Status: AC
  Administered 2021-12-09: 20 mg via ORAL
  Filled 2021-12-09: qty 1

## 2021-12-09 MED ORDER — MAGNESIUM SULFATE 2 GM/50ML IV SOLN
2.0000 g | Freq: Once | INTRAVENOUS | Status: AC
  Administered 2021-12-09: 2 g via INTRAVENOUS
  Filled 2021-12-09: qty 50

## 2021-12-09 NOTE — Progress Notes (Signed)
? ? ? [facility] ?                                                                            ? ? ?David Strong, is a 66 y.o. male, DOB - 1955-10-05, EZM:[id number] ?Admit date - 11/22/2021    ?Outpatient Primary MD for the patient is [name], MD ? ?LOS - 17  days ? ? ? ?Brief summary  ? ?66 year old male with medical history of diabetes mellitus type 2 presented to the ED with complaints of productive cough, subjective fever, chills, myalgia, severe arthralgia, new onset erythema and swelling of the left side of the base of the neck.  CTA showed no PE, confirmed right middle lobe pneumonia and bibasilar airspace opacities consistent with multifocal pneumonia and left sternoclavicular joint inflammatory changes with scattered foci of air within the soft tissue and sternoclavicular joint space compatible with left localized sternoclavicular septic arthritis/cellulitis, without evidence of drainable fluid collection or large joint effusion.  Subsequent BC ID and urine antigen positive for strep.  Antibiotics were narrowed down to ceftriaxone.  Newly diagnosed right upper arm SVT and left lower extremity DVT, briefly on IV heparin transition to Eliquis . ?MRI right shoulder findings suspicious for septic arthritis involving AC joint and glenohumeral joint.  Severe myositis and pyomyositis involving the subscapularis muscle, the anterior deltoid muscle and distal aspect of pectoralis muscle.  There is also myositis involving the infraspinatus muscle.   ?  ?Patient was seen by orthopedic surgery, no fluid could be aspirated on joint aspiration from the right shoulder.  He was seen by infectious disease and TEE was recommended, however patient did not want to get inpatient TEE so outpatient TEE was scheduled.  ID recommended to discharge home on 6 weeks of penicillin.  Home health RN/PT was arranged for IV antibiotics.  Eliquis also was sent to the pharmacy. ?  ?Before discharge patient complained of  worsening swelling of right chest wall, crepitus was palpated with likely subluxation of left clavicle at the sternoclavicular joint.  CT chest with contrast was obtained which showed progressive anterior subluxation of the left sternoclavicular joint with new erosive changes with bony fragmentation along the medial articular surface of the proximal left clavicle.  Findings on 7 4 septic arthritis.  There was progressive fluid and less collection centered on the left supraclavicular joint which extended to the left side of the neck.  Also Vollman to the left pectoralis muscle containing foci of gas and fluid.  Findings consistent with abscess. ?  ?CVTS was consulted, Dr. Roxan Hockey saw the patient and recommended transfer to [facility] for incision and drainage. ?Pt underwent s/p I&D on 12/05/21 and on 12/07/21. TEE is negative for vegetations, MRI of the knee is pending.  ? ?  ?  ? ? ?Assessment & Plan  ? ? ?Assessment and Plan: ?* Severe sepsis ([place]) ?Due to streptococcal multifocal pneumonia with bacteremia, presumed left sternoclavicular joint septic arthritis left supraclavicular abscess and other polyarthralgia/arthritis (right shoulder, left knee) ?Met sepsis criteria on admission including persistent tachypnea, tachycardia and did spike a fever of 100.6 overnight of admission.  CTA chest confirmed multifocal pneumonia.   ?Empirically started on IV vancomycin, ceftriaxone  and azithromycin. ?BCID and urine positive for Streptococcus.  ID consulted and his antibiotics narrowed to penicillin G on 4/10 plan for 6 weeks of antibiotics till 01/03/2022 ?TTE: LVEF 60-65%.  No evidence of valvular vegetations.  Surveillance blood cultures 4/7: NTD ?TEE is negative for endocarditis.  ? Outpatient follow up with ID.  ?Sepsis physiology has resolved but still with significant arthralgias/arthritis of above joints.   ?Pt was transferred to [facility] per cardiothoracic surgery request and he underwent irrigation and debridement of  the left sternoclavicular joint abscess on 12/04/21 and on 12/07/21. ?Cultures from the debridement are pending.  ? ? ?Septic arthritis ([place]) ?Left sternoclavicular joint septic arthritis ?Has other polyarthralgias/arthritis (right shoulder and left knee) ?See above. Left ankle swelling and left knee swelling with some cellulitis changes. Joint aspiration was attempted however no fluid obtained.   ?MRI of the left knee is pending.  ?Patient concerned about ankle edema, pt denies any pain in the ankles. Left knee swollen and tender and MRI is pending.  ?Pt has generalized body wall edema probably form hypoalbuminemia. Will stop the fluids and monitor.  ? ? ? ?DVT (deep venous thrombosis) ([place]) ? ?Left lower extremity: Age-indeterminate DVT left popliteal vein, acute DVT left gastrocnemius and left posterior tibial veins. ?Currently on IV heparin, transition to eliquis later today.  ?Due to complaint of bilateral leg pain and swelling, obtained RLE venous Dopplers which were negative for DVT or SVT. ?Hematology consulted for further evaluation in view of hypoalbuminemia, DVT.  ?Hematology suggested at least 3 months of anti coagulation and outpatient follow up with hematology for anti coagulopathy work up.  ? ?Transaminitis ?Mild and likely related to severe sepsis. ?No GI symptoms. ?RUQ ultrasound with gallbladder sludge and no acute findings. ?Continue to trend daily CMP. ? ?High anion gap metabolic acidosis ?Lactic acidosis ?Secondary to severe sepsis. ?Bicarbonate normal.  Anion gap has normalized. ?Resolved. ? ?Thrombocytopenia ([place]) ?Suspect chronic or acute on chronic, probably from sepsis.  ?Platelet counts in 2018: 104 ?Resolved. ? ?Type 2 diabetes mellitus ([place]) ?A1c 8 on 10/12/2021. ?On glipizide and dapagliflozin at home.  Currently held. ?CBGs uncontrolled and fluctuating.   ? ?Recent Labs  ?  12/08/21 ?2149 12/09/21 ?4235 12/09/21 ?1125  ?GLUCAP 103* 160* 152*  ? ? ?Resume Semglee and SSI. No change in  meds.  ? ?Hypokalemia ?Replaced.   ? ?Hypophosphatemia ?Replaced. ? ?Bilateral leg edema ?Likely due to IV fluids early on in admission and hypoalbuminemic state. ?Also has left lower extremity DVT. ? ?Constipation ?Aggressive bowel regimen. ?BMs documented. ? ? ? ?Hypertension:  ?Well controlled BP parameters.  ? ? ?Hyponatremia:  ?Mild , stable.  ?  ? ?Acute anemia of blood loss probably from debridement:  ?Anemia of acute illness.  ?His hemoglobin was normal on admission around 14.5, dropped to 9 and has been stable around 9.  ?Continue to monitor.  ? ? ? ?Hypomagnesemia:  ?Replaced.  ?  ? ?Malnutrition Type: ? ?Nutrition Problem: Increased nutrient needs ?Etiology: acute illness ? ? ?Malnutrition Characteristics: ? ?Signs/Symptoms: estimated needs ? ? ?Nutrition Interventions: ? ?Interventions: MVI, Premier Protein, Other (Comment) (double protein) ? ?Estimated body mass index is 30.88 kg/m? as calculated from the following: ?  Height as of this encounter: '5\' 4"'  (1.626 m). ?  Weight as of this encounter: 81.6 kg. ? ?Code Status: Full code.  ?DVT Prophylaxis:  Place TED hose Start: 12/08/21 1637 ?SCD's Start: 12/04/21 1836 ?SCDs Start: 11/22/21 1920 ? ? ?Level of Care: Level of care: Progressive ?  Family Communication: None at bedside.  ? ?Disposition Plan:     Remains inpatient appropriate:  INCISION AND DEBRIDEMENT ? ?Procedures:  ?IRRIGATION AND DEBRIDMENT OF STERNOCLAVICULAR WOUND, WOUND VAC PLACEMENT on the left.Marland Kitchen ?Consultants:   ?Cardiothoracic surgery.  ? ?Antimicrobials:  ? ?Anti-infectives (From admission, onward)  ? ? Start     Dose/Rate Route Frequency Ordered Stop  ? 12/04/21 1930  penicillin G potassium 12 Million Units in dextrose 5 % 500 mL continuous infusion  Status:  Discontinued       ? 12 Million Units ?41.7 mL/hr over 12 Hours Intravenous Every 12 hours 12/04/21 1835 12/04/21 1928  ? 12/04/21 1000  vancomycin (VANCOCIN) IVPB 1000 mg/200 mL premix       ? 1,000 mg ?200 mL/hr over 60 Minutes  Intravenous On call to O.R. 12/04/21 0958 12/04/21 1443  ? 12/01/21 0000  penicillin G IVPB       ? 24 Million Units Intravenous Every 24 hours 12/01/21 1303 01/03/22 2359  ? 11/27/21 1000  penicillin G pot

## 2021-12-09 NOTE — Progress Notes (Signed)
ANTICOAGULATION CONSULT NOTE - Follow Up Consult ? ?Pharmacy Consult for Heparin ?Indication: DVT ? ?Allergies  ?Allergen Reactions  ? Codeine Itching  ? Glipizide Er [Glipizide]   ?  SOB, malaise. He tolerates XL without issues.  ? Metformin And Related Rash  ? ? ?Patient Measurements: ?Height: 5\' 4"  (162.6 cm) ?Weight: 81.6 kg (179 lb 14.3 oz) ?IBW/kg (Calculated) : 59.2 ?Heparin Dosing Weight:  77 kg ? ?Vital Signs: ?Temp: 97.8 ?F (36.6 ?C) (04/22 03-20-1969) ?Temp Source: Oral (04/22 03-20-1969) ?BP: 120/75 (04/22 0718) ?Pulse Rate: 87 (04/22 0718) ? ?Labs: ?Recent Labs  ?  12/07/21 ?0530 12/08/21 ?0045 12/08/21 ?12/10/21 12/08/21 ?1943 12/09/21 ?0400  ?HGB 9.3* 9.1*  --   --  9.0*  ?HCT 28.9* 27.5*  --   --  27.3*  ?PLT 337 270  --   --  261  ?HEPARINUNFRC 0.36  --  0.22* 0.56 0.52  ?CREATININE 0.63 0.60*  --   --  0.62  ? ? ? ?Estimated Creatinine Clearance: 88.8 mL/min (by C-G formula based on SCr of 0.62 mg/dL). ? ? ?Medical History: ?Past Medical History:  ?Diagnosis Date  ? Diabetes mellitus without complication (HCC)   ? ? ?Assessment: ?66 yo male with D-dimer 5.37.  CT chest negative for PE. Found to have new L DVT. Thrombocytopenia appears chronic. ? ?Patient was changed from heparin drip to apixaban 4/10.  Apixaban held 4/16 for debridement 4/17. Heparin ordered as bridge while apixaban on hold. ?S/p repeat debridement and VAC change 4/20.  ? ?Heparin level of 0.52 at goal on 1700 units/hr. Hgb 9.0 stable. Plt wnl. No bleeding noted.  ? ?Goal of Therapy:  ?Heparin level 0.3-0.5  ?Monitor platelets by anticoagulation protocol: Yes ?  ?Plan:  ?Continue IV heparin to 1700 units/hr. ?Monitor daily heparin level, CBC and s/s of bleeding  ? ?5/20, PharmD ?PGY1 Pharmacy Resident ?12/09/2021  7:22 AM ? ?Please check [email or web address] for unit-specific pharmacy phone numbers. ?

## 2021-12-09 NOTE — Plan of Care (Signed)
?  Problem: Education: ?Goal: Knowledge of General Education information will improve ?Description: Including pain rating scale, medication(s)/side effects and non-pharmacologic comfort measures ?Outcome: Progressing ?  ?Problem: Health Behavior/Discharge Planning: ?Goal: Ability to manage health-related needs will improve ?Outcome: Progressing ?  ?Problem: Clinical Measurements: ?Goal: Ability to maintain clinical measurements within normal limits will improve ?Outcome: Progressing ?Goal: Diagnostic test results will improve ?Outcome: Progressing ?Goal: Respiratory complications will improve ?Outcome: Progressing ?Goal: Cardiovascular complication will be avoided ?Outcome: Progressing ?  ?Problem: Nutrition: ?Goal: Adequate nutrition will be maintained ?Outcome: Progressing ?  ?Problem: Coping: ?Goal: Level of anxiety will decrease ?Outcome: Progressing ?  ?Problem: Elimination: ?Goal: Will not experience complications related to bowel motility ?Outcome: Progressing ?Goal: Will not experience complications related to urinary retention ?Outcome: Progressing ?  ?Problem: Pain Managment: ?Goal: General experience of comfort will improve ?Outcome: Progressing ?  ?Problem: Safety: ?Goal: Ability to remain free from injury will improve ?Outcome: Progressing ?  ?

## 2021-12-09 NOTE — Progress Notes (Signed)
? ?   ?[address] ?      [name] [address] ?            [phone]   ? ?  1 Day Post-Op Procedure(s) (LRB): ?TRANSESOPHAGEAL ECHOCARDIOGRAM (TEE) (N/A) ?Subjective: ?Some mild pain from wound ? ?Objective: ?Vital signs in last 24 hours: ?Temp:  [97.5 ?F (36.4 ?C)-98.9 ?F (37.2 ?C)] 97.8 ?F (36.6 ?C) (04/22 3532) ?Pulse Rate:  [87-100] 87 (04/22 0718) ?Cardiac Rhythm: Normal sinus rhythm (04/22 0749) ?Resp:  [13-24] 19 (04/22 0718) ?BP: (111-145)/(70-87) 120/75 (04/22 0718) ?SpO2:  [95 %-100 %] 100 % (04/22 0718) ?Weight:  [81.6 kg] 81.6 kg (04/22 0351) ? ?Hemodynamic parameters for last 24 hours: ?  ? ?Intake/Output from previous day: ?04/21 0701 - 04/22 0700 ?In: 1718.6 [P.O.:440; I.V.:1278.6] ?Out: 1000 [Urine:1000] ?Intake/Output this shift: ?Total I/O ?In: -  ?Out: 150 [Drains:150] ? ?Wound: Vac in place without surrounding erethema or purulence ? ?Lab Results: ?Recent Labs  ?  12/08/21 ?0045 12/09/21 ?0400  ?WBC 5.3 4.5  ?HGB 9.1* 9.0*  ?HCT 27.5* 27.3*  ?PLT 270 261  ? ?BMET:  ?Recent Labs  ?  12/08/21 ?0045 12/09/21 ?0400  ?NA 132* 134*  ?K 3.7 3.5  ?CL 100 100  ?CO2 29 28  ?GLUCOSE 201* 156*  ?BUN <5* <5*  ?CREATININE 0.60* 0.62  ?CALCIUM 7.6* 7.7*  ?  ?PT/INR: No results for input(s): LABPROT, INR in the last 72 hours. ?ABG ?No results found for: PHART, HCO3, TCO2, ACIDBASEDEF, O2SAT ?CBG (last 3)  ?Recent Labs  ?  12/08/21 ?1643 12/08/21 ?2149 12/09/21 ?9924  ?GLUCAP 177* 103* 160*  ? ? ?Meds ?Scheduled Meds: ? acetaminophen  1,000 mg Oral Q6H  ? Or  ? acetaminophen (TYLENOL) oral liquid 160 mg/5 mL  1,000 mg Oral Q6H  ? bisacodyl  10 mg Oral Daily  ? Chlorhexidine Gluconate Cloth  6 each Topical Daily  ? insulin aspart  0-15 Units Subcutaneous TID WC  ? insulin aspart  0-5 Units Subcutaneous QHS  ? insulin aspart  8 Units Subcutaneous TID WC  ? insulin glargine-yfgn  40 Units Subcutaneous Daily  ? lidocaine  1 patch Transdermal Q24H  ? multivitamin with minerals  1 tablet Oral Daily   ? polyethylene glycol  17 g Oral BID  ? Ensure Max Protein  11 oz Oral BID  ? senna-docusate  1 tablet Oral QHS  ? senna-docusate  2 tablet Oral Daily  ? simethicone  80 mg Oral QID  ? ?Continuous Infusions: ? sodium chloride 75 mL/hr at 12/09/21 0557  ? heparin 1,700 Units/hr (12/09/21 0557)  ? penicillin g continuous IV infusion 12 Million Units (12/09/21 2683)  ? ?PRN Meds:.acetaminophen **OR** [DISCONTINUED] acetaminophen, alum & mag hydroxide-simeth, benzonatate, bisacodyl, fentaNYL (SUBLIMAZE) injection, hydrALAZINE, methocarbamol, naLOXone (NARCAN)  injection, ondansetron (ZOFRAN) IV, oxyCODONE, oxyCODONE, sodium chloride flush ? ?Xrays ?ECHO TEE ? ?Result Date: 12/08/2021 ?   TRANSESOPHOGEAL ECHO REPORT   Patient Name:   David Strong Date of Exam: 12/08/2021 Medical Rec #:  [id number]    Height:       64.0 in Accession #:    [id number]   Weight:       177.7 lb Date of Birth:  14-Nov-1955    BSA:          1.860 m? Patient Age:    65 years     BP:           128/71 mmHg Patient Gender: M  HR:           94 bpm. Exam Location:  Inpatient Procedure: Transesophageal Echo, Cardiac Doppler and Color Doppler Indications:     Bacteremia  History:         Patient has prior history of Echocardiogram examinations.                  Signs/Symptoms:Bacteremia; Risk Factors:Diabetes.  Sonographer:     Sheralyn Boatmanina West RDCS Referring Phys:  [id number] Salvatore DecentSTEVEN C HENDRICKSON Diagnosing Phys: Riley LamMahesh Chandrasekhar MD PROCEDURE: After discussion of the risks and benefits of a TEE, an informed consent was obtained from the patient. The transesophogeal probe was passed without difficulty through the esophogus of the patient. Imaged were obtained with the patient in a left lateral decubitus position. Sedation performed by different physician. The patient was monitored while under deep sedation. Anesthestetic sedation was provided intravenously by Anesthesiology: 173mg  of Propofol. The patient's vital signs; including heart rate, blood  pressure, and oxygen saturation; remained stable throughout the procedure. The patient developed no complications during the procedure. IMPRESSIONS  1. Left ventricular ejection fraction, by estimation, is 60 to 65%. The left ventricle has normal function.  2. Right ventricular systolic function is normal. The right ventricular size is normal.  3. No left atrial/left atrial appendage thrombus was detected.  4. 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 No evidence of mitral valve regurgitation.  5. The aortic valve is tricuspid. Aortic valve regurgitation is mild. No aortic stenosis is present. Conclusion(s)/Recommendation(s): No evidence of vegetation/infective endocarditis on this transesophageael echocardiogram. FINDINGS  Left Ventricle: Left ventricular ejection fraction, by estimation, is 60 to 65%. The left ventricle has normal function. The left ventricular internal cavity size was normal in size. Right Ventricle: The right ventricular size is normal. Right vetricular wall thickness was not assessed. Right ventricular systolic function is normal. Left Atrium: Left atrial size was normal in size. No left atrial/left atrial appendage thrombus was detected. Right Atrium: Right atrial size was normal in size. Pericardium: There is no evidence of pericardial effusion. Mitral Valve: The mitral valve is abnormal. No evidence of mitral valve regurgitation. Tricuspid Valve: The tricuspid valve is normal in structure. Tricuspid valve regurgitation is mild. Aortic Valve: The aortic valve is tricuspid. Aortic valve regurgitation is mild. No aortic stenosis is present. Aortic valve mean gradient measures 5.0 mmHg. Aortic valve peak gradient measures 9.4 mmHg. Pulmonic Valve: The pulmonic valve was normal in structure. Pulmonic valve regurgitation is mild. No evidence of pulmonic stenosis. Aorta: The aortic root and ascending aorta are structurally normal, with no evidence of dilitation. IAS/Shunts: No atrial  level shunt detected by color flow Doppler. Additional Comments: A venous catheter is visualized.  AORTIC VALVE AV Vmax:           153.00 cm/s AV Vmean:          105.000 cm/s AV VTI:            0.230 m AV Peak Grad:      9.4 mmHg AV Mean Grad:      5.0 mmHg LVOT Vmax:         159.00 cm/s LVOT Vmean:        92.400 cm/s LVOT VTI:          0.246 m LVOT/AV VTI ratio: 1.07  SHUNTS Systemic VTI: 0.25 m Riley LamMahesh Chandrasekhar MD Electronically signed by Riley LamMahesh Chandrasekhar MD Signature Date/Time: 12/08/2021/4:53:22 PM    Final    ? ? ?Recent Results (from the  past 240 hour(s))  ?Aerobic/Anaerobic Culture w Gram Stain (surgical/deep wound)     Status: None (Preliminary result)  ? Collection Time: 12/04/21  2:41 PM  ? Specimen: PATH Other; Body Fluid  ?Result Value Ref Range Status  ? Specimen Description FLUID  Final  ? Special Requests LEFT sTERNOCLAVICULAR JOINT SPEC B  Final  ? Gram Stain   Final  ?  FEW WBC PRESENT,BOTH PMN AND MONONUCLEAR ?NO ORGANISMS SEEN ?  ? Culture   Final  ?  NO GROWTH 4 DAYS NO ANAEROBES ISOLATED; CULTURE IN PROGRESS FOR 5 DAYS ?Performed at [facility], [address] [place], [place] [address] ?  ? Report Status PENDING  Incomplete  ?Fungus Culture With Stain     Status: None (Preliminary result)  ? Collection Time: 12/04/21  2:43 PM  ? Specimen: PATH Other; Body Fluid  ?Result Value Ref Range Status  ? Fungus Stain Final report  Final  ?  Comment: (NOTE) ?Performed At: [facility] ?[address] [place], [place] [id number] ?Jolene Schimke MD YK:[id number] ?  ? Fungus (Mycology) Culture PENDING  Incomplete  ? Fungal Source ABSCESS  Final  ?  Comment: LEFT STERNOCLAVICULAR JOINT ?Performed at [facility], [address], [place], [place] [address] ?  ?Aerobic/Anaerobic Culture w Gram Stain (surgical/deep wound)     Status: None (Preliminary result)  ? Collection Time: 12/04/21  2:43 PM  ? Specimen: PATH Other; Body Fluid  ?Result Value Ref Range Status  ? Specimen  Description ABSCESS  Final  ? Special Requests LEFT STERNOCLAVICULAR JOINT SWAB SPEC A  Final  ? Gram Stain   Final  ?  FEW WBC PRESENT, PREDOMINANTLY PMN ?NO ORGANISMS SEEN ?  ? Culture   Final  ?  NO GROWTH 4 DAY

## 2021-12-09 NOTE — Plan of Care (Signed)
  Problem: Education: Goal: Knowledge of General Education information will improve Description Including pain rating scale, medication(s)/side effects and non-pharmacologic comfort measures Outcome: Progressing   Problem: Health Behavior/Discharge Planning: Goal: Ability to manage health-related needs will improve Outcome: Progressing   

## 2021-12-10 DIAGNOSIS — R6520 Severe sepsis without septic shock: Secondary | ICD-10-CM | POA: Diagnosis not present

## 2021-12-10 DIAGNOSIS — R600 Localized edema: Secondary | ICD-10-CM | POA: Diagnosis not present

## 2021-12-10 DIAGNOSIS — M60009 Infective myositis, unspecified site: Secondary | ICD-10-CM

## 2021-12-10 DIAGNOSIS — J189 Pneumonia, unspecified organism: Secondary | ICD-10-CM | POA: Diagnosis not present

## 2021-12-10 DIAGNOSIS — A419 Sepsis, unspecified organism: Secondary | ICD-10-CM | POA: Diagnosis not present

## 2021-12-10 DIAGNOSIS — M71162 Other infective bursitis, left knee: Secondary | ICD-10-CM

## 2021-12-10 DIAGNOSIS — L0390 Cellulitis, unspecified: Secondary | ICD-10-CM

## 2021-12-10 LAB — CBC WITH DIFFERENTIAL/PLATELET
Abs Immature Granulocytes: 0.02 10*3/uL (ref 0.00–0.07)
Basophils Absolute: 0 10*3/uL (ref 0.0–0.1)
Basophils Relative: 1 %
Eosinophils Absolute: 0.1 10*3/uL (ref 0.0–0.5)
Eosinophils Relative: 2 %
HCT: 28.2 % — ABNORMAL LOW (ref 39.0–52.0)
Hemoglobin: 9 g/dL — ABNORMAL LOW (ref 13.0–17.0)
Immature Granulocytes: 1 %
Lymphocytes Relative: 32 %
Lymphs Abs: 1.4 10*3/uL (ref 0.7–4.0)
MCH: 29.3 pg (ref 26.0–34.0)
MCHC: 31.9 g/dL (ref 30.0–36.0)
MCV: 91.9 fL (ref 80.0–100.0)
Monocytes Absolute: 0.5 10*3/uL (ref 0.1–1.0)
Monocytes Relative: 12 %
Neutro Abs: 2.3 10*3/uL (ref 1.7–7.7)
Neutrophils Relative %: 52 %
Platelets: 279 10*3/uL (ref 150–400)
RBC: 3.07 MIL/uL — ABNORMAL LOW (ref 4.22–5.81)
RDW: 13.5 % (ref 11.5–15.5)
WBC: 4.4 10*3/uL (ref 4.0–10.5)
nRBC: 0 % (ref 0.0–0.2)

## 2021-12-10 LAB — COMPREHENSIVE METABOLIC PANEL
ALT: 25 U/L (ref 0–44)
AST: 22 U/L (ref 15–41)
Albumin: <1.5 g/dL — ABNORMAL LOW (ref 3.5–5.0)
Alkaline Phosphatase: 170 U/L — ABNORMAL HIGH (ref 38–126)
Anion gap: 5 (ref 5–15)
BUN: <5 mg/dL — ABNORMAL LOW (ref 8–23)
CO2: 29 mmol/L (ref 22–32)
Calcium: 8.1 mg/dL — ABNORMAL LOW (ref 8.9–10.3)
Chloride: 101 mmol/L (ref 98–111)
Creatinine, Ser: 0.57 mg/dL — ABNORMAL LOW (ref 0.61–1.24)
GFR, Estimated: >60 mL/min (ref >60)
Glucose, Bld: 101 mg/dL — ABNORMAL HIGH (ref 70–99)
Potassium: 3.6 mmol/L (ref 3.5–5.1)
Sodium: 135 mmol/L (ref 135–145)
Total Bilirubin: 0.7 mg/dL (ref 0.3–1.2)
Total Protein: 5.7 g/dL — ABNORMAL LOW (ref 6.5–8.1)

## 2021-12-10 LAB — PHOSPHORUS: Phosphorus: 3.4 mg/dL (ref 2.5–4.6)

## 2021-12-10 LAB — GLUCOSE, CAPILLARY
Glucose-Capillary: 77 mg/dL (ref 70–99)
Glucose-Capillary: 264 mg/dL — ABNORMAL HIGH (ref 70–99)
Glucose-Capillary: 219 mg/dL — ABNORMAL HIGH (ref 70–99)
Glucose-Capillary: 101 mg/dL — ABNORMAL HIGH (ref 70–99)

## 2021-12-10 LAB — MAGNESIUM: Magnesium: 1.8 mg/dL (ref 1.7–2.4)

## 2021-12-10 LAB — HEPARIN LEVEL (UNFRACTIONATED)
Heparin Unfractionated: 0.66 IU/mL (ref 0.30–0.70)
Heparin Unfractionated: 0.41 IU/mL (ref 0.30–0.70)

## 2021-12-10 MED ORDER — FUROSEMIDE 20 MG PO TABS
20.0000 mg | ORAL_TABLET | Freq: Every day | ORAL | Status: DC
  Administered 2021-12-10: 20 mg via ORAL
  Filled 2021-12-10: qty 1

## 2021-12-10 MED ORDER — INSULIN GLARGINE-YFGN 100 UNIT/ML ~~LOC~~ SOLN
35.0000 [IU] | Freq: Every day | SUBCUTANEOUS | Status: DC
  Administered 2021-12-11 – 2021-12-18 (×8): 35 [IU] via SUBCUTANEOUS
  Filled 2021-12-10 (×8): qty 0.35

## 2021-12-10 NOTE — Progress Notes (Signed)
ANTICOAGULATION CONSULT NOTE - Follow Up Consult ? ?Pharmacy Consult for Heparin ?Indication: DVT ? ?Allergies  ?Allergen Reactions  ? Codeine Itching  ? Glipizide Er [Glipizide]   ?  SOB, malaise. He tolerates XL without issues.  ? Metformin And Related Rash  ? ? ?Patient Measurements: ?Height: 5\' 4"  (162.6 cm) ?Weight: 81.4 kg (179 lb 7.3 oz) ?IBW/kg (Calculated) : 59.2 ?Heparin Dosing Weight:  77 kg ? ?Vital Signs: ?Temp: 97.9 ?F (36.6 ?C) (04/23 1115) ?Temp Source: Oral (04/23 1115) ?BP: 121/82 (04/23 1115) ?Pulse Rate: 86 (04/23 1115) ? ?Labs: ?Recent Labs  ?  12/08/21 ?0045 12/08/21 ?0749 12/09/21 ?0400 12/10/21 ?0510 12/10/21 ?1515  ?HGB 9.1*  --  9.0* 9.0*  --   ?HCT 27.5*  --  27.3* 28.2*  --   ?PLT 270  --  261 279  --   ?HEPARINUNFRC  --    < > 0.52 0.66 0.41  ?CREATININE 0.60*  --  0.62 0.57*  --   ? < > = values in this interval not displayed.  ? ? ? ?Estimated Creatinine Clearance: 88.7 mL/min (A) (by C-G formula based on SCr of 0.57 mg/dL (L)). ? ? ?Medical History: ?Past Medical History:  ?Diagnosis Date  ? Diabetes mellitus without complication (HCC)   ? ? ?Assessment: ?66 yo male with D-dimer 5.37.  CT chest negative for PE. Found to have new L DVT. Thrombocytopenia appears chronic. ? ?Patient was changed from heparin drip to apixaban 4/10.  Apixaban held 4/16 for debridement 4/17. Heparin ordered as bridge while apixaban on hold. ?S/p repeat debridement and VAC change 4/20. Due for another VAC change and I&D on Monday 4/24. ? ?Heparin level elevated earlier today and now 0.4 at goal after heparin drip rate reduced 1550 uts/hr . Hgb 9.0 stable. Plt wnl. No bleeding noted or infusion problems per RN. ? ?Goal of Therapy:  ?Heparin level 0.3-0.5  ?Monitor platelets by anticoagulation protocol: Yes ?  ?Plan:  ?Continue  heparin infusion  1550 units/hr ?Monitor daily heparin level, CBC and s/s of bleeding  ? ? ? ?5/24 Pharm.D. CPP, BCPS ?Clinical Pharmacist ?[phone] ?12/10/2021 5:03  PM  ? ?Please check [email or web address] for unit-specific pharmacy phone numbers. ?

## 2021-12-10 NOTE — Plan of Care (Signed)

## 2021-12-10 NOTE — Progress Notes (Signed)
ANTICOAGULATION CONSULT NOTE - Follow Up Consult ? ?Pharmacy Consult for Heparin ?Indication: DVT ? ?Allergies  ?Allergen Reactions  ? Codeine Itching  ? Glipizide Er [Glipizide]   ?  SOB, malaise. He tolerates XL without issues.  ? Metformin And Related Rash  ? ? ?Patient Measurements: ?Height: 5\' 4"  (162.6 cm) ?Weight:  (refused to stand on scale right now/will stand on scale when he gets up next to use the bathroom/educated on importance of daily standing weights) ?IBW/kg (Calculated) : 59.2 ?Heparin Dosing Weight:  77 kg ? ?Vital Signs: ?Temp: 98.1 ?F (36.7 ?C) (04/23 03-07-1981) ?Temp Source: Oral (04/23 03-07-1981) ?BP: 143/86 (04/23 0506) ?Pulse Rate: 95 (04/23 0708) ? ?Labs: ?Recent Labs  ?  12/08/21 ?0045 12/08/21 ?12/10/21 12/08/21 ?1943 12/09/21 ?0400 12/10/21 ?0510  ?HGB 9.1*  --   --  9.0* 9.0*  ?HCT 27.5*  --   --  27.3* 28.2*  ?PLT 270  --   --  261 279  ?HEPARINUNFRC  --    < > 0.56 0.52 0.66  ?CREATININE 0.60*  --   --  0.62 0.57*  ? < > = values in this interval not displayed.  ? ? ? ?Estimated Creatinine Clearance: 88.8 mL/min (A) (by C-G formula based on SCr of 0.57 mg/dL (L)). ? ? ?Medical History: ?Past Medical History:  ?Diagnosis Date  ? Diabetes mellitus without complication (HCC)   ? ? ?Assessment: ?66 yo male with D-dimer 5.37.  CT chest negative for PE. Found to have new L DVT. Thrombocytopenia appears chronic. ? ?Patient was changed from heparin drip to apixaban 4/10.  Apixaban held 4/16 for debridement 4/17. Heparin ordered as bridge while apixaban on hold. ?S/p repeat debridement and VAC change 4/20. Due for another VAC change and I&D on Monday. ? ?Heparin level of 0.66 above goal on 1700 units/hr. Hgb 9.0 stable. Plt wnl. No bleeding noted or infusion problems per RN. ? ?Goal of Therapy:  ?Heparin level 0.3-0.5  ?Monitor platelets by anticoagulation protocol: Yes ?  ?Plan:  ?Reduce heparin infusion to 1550 units/hr ?6 hour heparin level ?Monitor daily heparin level, CBC and s/s of bleeding  ? ?Saturday, PharmD ?PGY1 Pharmacy Resident ?12/10/2021  7:25 AM ? ?Please check [email or web address] for unit-specific pharmacy phone numbers. ?

## 2021-12-10 NOTE — Progress Notes (Signed)
Subjective: ? ?Sore at [facility] site. Concerned about ankle and pedal edema bilaterally ? ?Objective: ?Vital signs in last 24 hours: ?Temp:  [97.9 ?F (36.6 ?C)-98.3 ?F (36.8 ?C)] 98.1 ?F (36.7 ?C) (04/23 [id number]) ?Pulse Rate:  [79-99] 95 (04/23 0708) ?Cardiac Rhythm: Normal sinus rhythm (04/23 0751) ?Resp:  [14-20] 15 (04/23 0708) ?BP: (124-143)/(73-93) 137/73 (04/23 0708) ?SpO2:  [95 %-98 %] 95 % (04/23 0708) ? ?Hemodynamic parameters for last 24 hours: ?  ? ?Intake/Output from previous day: ?04/22 0701 - 04/23 0700 ?In: 240 [P.O.:240] ?Out: 1950 [Urine:1700; Drains:250] ?Intake/Output this shift: ?Total I/O ?In: 480 [P.O.:480] ?Out: 250 [Urine:250] ? ?General appearance: alert and cooperative ?Wound: VAC in place. Minimal drainage. ? ?Lab Results: ?Recent Labs  ?  12/09/21 ?0400 12/10/21 ?0510  ?WBC 4.5 4.4  ?HGB 9.0* 9.0*  ?HCT 27.3* 28.2*  ?PLT 261 279  ? ?BMET:  ?Recent Labs  ?  12/09/21 ?0400 12/10/21 ?0510  ?NA 134* 135  ?K 3.5 3.6  ?CL 100 101  ?CO2 28 29  ?GLUCOSE 156* 101*  ?BUN <5* <5*  ?CREATININE 0.62 0.57*  ?CALCIUM 7.7* 8.1*  ?  ?PT/INR: No results for input(s): LABPROT, INR in the last 72 hours. ?ABG ?No results found for: PHART, HCO3, TCO2, ACIDBASEDEF, O2SAT ?CBG (last 3)  ?Recent Labs  ?  12/09/21 ?1623 12/09/21 ?2113 12/10/21 ?0619  ?GLUCAP 71 131* 77  ? ? ?Assessment/Plan: ? ?Left SCJ infection. Culture of wound negative so far. No organisms on gram stain. ?Dr. Roxan Hockey will decide about wound vac change. ? ? ? LOS: 18 days  ? ? ?Gaye Pollack ?12/10/2021 ? ? ?

## 2021-12-10 NOTE — Progress Notes (Signed)
? ? ? [facility] ?                                                                            ? ? ?David Strong, is a 66 y.o. male, DOB - Sep 13, 1955, URK:[id number] ?Admit date - 11/22/2021    ?Outpatient Primary MD for the patient is David Honour Ines Bloomer, MD ? ?LOS - 18  days ? ? ? ?Brief summary  ? ?66 year old male with medical history of diabetes mellitus type 2 presented to the ED with complaints of productive cough, subjective fever, chills, myalgia, severe arthralgia, new onset erythema and swelling of the left side of the base of the neck.  CTA showed no PE, confirmed right middle lobe pneumonia and bibasilar airspace opacities consistent with multifocal pneumonia and left sternoclavicular joint inflammatory changes with scattered foci of air within the soft tissue and sternoclavicular joint space compatible with left localized sternoclavicular septic arthritis/cellulitis, without evidence of drainable fluid collection or large joint effusion.  Subsequent BC ID and urine antigen positive for strep.  Antibiotics were narrowed down to ceftriaxone.  Newly diagnosed right upper arm SVT and left lower extremity DVT, briefly on IV heparin transition to Eliquis . ?MRI right shoulder findings suspicious for septic arthritis involving AC joint and glenohumeral joint.  Severe myositis and pyomyositis involving the subscapularis muscle, the anterior deltoid muscle and distal aspect of pectoralis muscle.  There is also myositis involving the infraspinatus muscle.   ?  ?Patient was seen by orthopedic surgery, no fluid could be aspirated on joint aspiration from the right shoulder.  He was seen by infectious disease and TEE was recommended, however patient did not want to get inpatient TEE so outpatient TEE was scheduled.  ID recommended to discharge home on 6 weeks of penicillin.  Home health RN/PT was arranged for IV antibiotics.  Eliquis also was sent to the pharmacy. ?  ?Before discharge patient complained of  worsening swelling of right chest wall, crepitus was palpated with likely subluxation of left clavicle at the sternoclavicular joint.  CT chest with contrast was obtained which showed progressive anterior subluxation of the left sternoclavicular joint with new erosive changes with bony fragmentation along the medial articular surface of the proximal left clavicle.  Findings on 7 4 septic arthritis.  There was progressive fluid and less collection centered on the left supraclavicular joint which extended to the left side of the neck.  Also Vollman to the left pectoralis muscle containing foci of gas and fluid.  Findings consistent with abscess. ?  ?CVTS was consulted, Dr. Roxan Hockey saw the patient and recommended transfer to [facility] for incision and drainage. ?Pt underwent s/p I&D on 12/05/21 and on 12/07/21. TEE is negative for vegetations, MRI of the knee done, showed evidence of fluid within the left knee as well as several multifocal abscesses involving the posterior soft tissues of the left knee. Orthopedics consulted and recommendations given.  ? ?  ?  ? ? ?Assessment & Plan  ? ? ?Assessment and Plan: ?* Severe sepsis ([place]) ?Due to streptococcal multifocal pneumonia with bacteremia, presumed left sternoclavicular joint septic arthritis left supraclavicular abscess and other polyarthralgia/arthritis (right shoulder, left knee) ?Met sepsis criteria on admission including  persistent tachypnea, tachycardia and did spike a fever of 100.6 overnight of admission.  CTA chest confirmed multifocal pneumonia.   ?Empirically started on IV vancomycin, ceftriaxone and azithromycin. ?BCID and urine positive for Streptococcus.  ID consulted and his antibiotics narrowed to penicillin G on 4/10 plan for 6 weeks of antibiotics till 01/03/2022 ?TTE: LVEF 60-65%.  No evidence of valvular vegetations.  Surveillance blood cultures 4/7: NTD ?TEE is negative for endocarditis.  ? Outpatient follow up with ID.  ?Sepsis physiology has  resolved but still with significant arthralgias/arthritis of above joints.   ?Pt was transferred to [facility] per cardiothoracic surgery request and he underwent irrigation and debridement of the left sternoclavicular joint abscess on 12/04/21 and on 12/07/21. ?Cultures from the debridement are pending.  ?MRI of the knee done, showed evidence of fluid within the left knee as well as several multifocal abscesses involving the posterior soft tissues of the left knee. Orthopedics consulted and recommendations given.  ? ? ?Septic arthritis ([place]) ?Left sternoclavicular joint septic arthritis ?Has other polyarthralgias/arthritis (right shoulder and left knee) ?See above. Left ankle swelling and left knee swelling with some cellulitis changes. Joint aspiration was attempted however no fluid obtained.   ?MRI of the left knee is pending.  ?Patient concerned about ankle edema, pt denies any pain in the ankles. Left knee swollen and tender and MRI is pending.  ?Pt has generalized body wall edema probably form hypoalbuminemia. Prn lasix .  ? ? ? ?DVT (deep venous thrombosis) ([place]) ? ?Left lower extremity: Age-indeterminate DVT left popliteal vein, acute DVT left gastrocnemius and left posterior tibial veins. ?Currently on IV heparin, transition to eliquis later today.  ?Due to complaint of bilateral leg pain and swelling, obtained RLE venous Dopplers which were negative for DVT or SVT. ?Hematology consulted for further evaluation in view of hypoalbuminemia, DVT.  ?Hematology suggested at least 3 months of anti coagulation and outpatient follow up with hematology for anti coagulopathy work up.  ? ?Transaminitis ?Mild and likely related to severe sepsis. ?No GI symptoms. ?RUQ ultrasound with gallbladder sludge and no acute findings. ?Continue to trend daily CMP. ? ?High anion gap metabolic acidosis ?Lactic acidosis ?Secondary to severe sepsis. ?Bicarbonate normal.  Anion gap has normalized. ?Resolved. ? ?Thrombocytopenia ([place]) ?Suspect  chronic or acute on chronic, probably from sepsis.  ?Platelet counts in 2018: 104 ?Resolved. ? ?Type 2 diabetes mellitus ([place]) ?A1c 8 on 10/12/2021. ?On glipizide and dapagliflozin at home.  Currently held. ?CBGs uncontrolled and fluctuating.   ? ?Recent Labs  ?  12/10/21 ?0619 12/10/21 ?1117 12/10/21 ?1643  ?GLUCAP 77 264* 219*  ? ? ?Decrease semglee to 35 unts and continue with novolog 35 units tidac and SSI.  No change in meds.  ? ?Hypokalemia ?Replaced.   ? ?Hypophosphatemia ?Replaced. ? ?Bilateral leg edema ?Likely due to IV fluids early on in admission and hypoalbuminemic state. ?Also has left lower extremity DVT. Prn lasix.  ? ?Constipation ?Aggressive bowel regimen. ?BMs documented. ? ?Hyponatremia:  ?Mild , stable.  ?  ? ?Acute anemia of blood loss probably from debridement:  ?Anemia of acute illness.  ?His hemoglobin was normal on admission around 14.5, dropped to 9 and has been stable around 9.  ?Continue to monitor.  ? ? ? ?Hypomagnesemia:  ?Replaced.  ? ? ?Right elbow swelling and tenderness:  ?X rays of the elbow ordered and pending.  ?  ? ?Malnutrition Type: ? ?Nutrition Problem: Increased nutrient needs ?Etiology: acute illness ? ? ?Malnutrition Characteristics: ? ?Signs/Symptoms: estimated needs ? ? ?  Nutrition Interventions: ? ?Interventions: MVI, Premier Protein, Other (Comment) (double protein) ? ?Estimated body mass index is 30.8 kg/m? as calculated from the following: ?  Height as of this encounter: _0  (1.626 m). ?  Weight as of this encounter: 81.4 kg. ? ?Code Status: Full code.  ?DVT Prophylaxis:  Place TED hose Start: 12/08/21 1637 ?SCD's Start: 12/04/21 1836 ?SCDs Start: 11/22/21 1920 ? ? ?Level of Care: Level of care: Progressive ?Family Communication: None at bedside.  ? ?Disposition Plan:     Remains inpatient appropriate:  INCISION AND DEBRIDEMENT ? ?Procedures:  ?IRRIGATION AND DEBRIDMENT OF STERNOCLAVICULAR WOUND, WOUND VAC PLACEMENT on the left.Marland Kitchen ?Consultants:   ?Cardiothoracic  surgery.  ? ?Antimicrobials:  ? ?Anti-infectives (From admission, onward)  ? ? Start     Dose/Rate Route Frequency Ordered Stop  ? 12/04/21 1930  penicillin G potassium 12 Million Units in dextrose 5 % 500

## 2021-12-10 NOTE — Consult Note (Signed)
? ?ORTHOPAEDIC CONSULTATION ? ?REQUESTING PHYSICIAN: Hosie Poisson, MD ? ?Chief Complaint: Polyarticular pain, question of septic joint ? ?HPI: ?David Strong is a 66 y.o. male who presents with right-hand-dominant male presents with multiple swelling of his joints.  Specifically he has been in the hospital diagnosed with disseminated strep pneumoniae for which he has ultimately undergone a left sternoclavicular joint debridement.  He was previously admitted to [facility] long hospital where my partner Dr. Zollie Beckers had seen him.  At this time aspiration attempt was made at the right shoulder and left knee without ability to aspirate any fluid.  He has had persistent swelling of the entirety of his right upper extremity as well as left lower extremity in the setting of a known right upper extremity DVT for which she is on heparin.  Denies pain with range of motion about the shoulder or knee passively.  He is complaining of swelling about the right shoulder right elbow right wrist, right hand as well as the left thigh, left knee, left ankle. ? ?Past Medical History:  ?Diagnosis Date  ? Diabetes mellitus without complication ([place])   ? ?Past Surgical History:  ?Procedure Laterality Date  ? APPLICATION OF WOUND VAC Left 12/07/2021  ? Procedure: WOUND VAC CHANGE;  Surgeon: Melrose Nakayama, MD;  Location: [place];  Service: Vascular;  Laterality: Left;  ? INCISION AND DRAINAGE OF WOUND Left 12/04/2021  ? Procedure: IRRIGATION AND DEBRIDMENT OF STERNOCLAVICULAR WOUND, WOUND VAC PLACMENT.;  Surgeon: Melrose Nakayama, MD;  Location: [place];  Service: Vascular;  Laterality: Left;  ? IRRIGATION AND DEBRIDEMENT ABSCESS Left 12/07/2021  ? Procedure: IRRIGATION AND DEBRIDEMENT ABSCESS;  Surgeon: Melrose Nakayama, MD;  Location: [place];  Service: Vascular;  Laterality: Left;  ? ?Social History  ? ?Socioeconomic History  ? Marital status: Married  ?  Spouse name: Not on file  ? Number of children: Not on file  ? Years of  education: Not on file  ? Highest education level: Not on file  ?Occupational History  ? Not on file  ?Tobacco Use  ? Smoking status: Former  ? Smokeless tobacco: Never  ?Substance and Sexual Activity  ? Alcohol use: Yes  ?  Comment: occasional  ? Drug use: No  ? Sexual activity: Yes  ?Other Topics Concern  ? Not on file  ?Social History Narrative  ? Not on file  ? ?Social Determinants of Health  ? ?Financial Resource Strain: Not on file  ?Food Insecurity: Not on file  ?Transportation Needs: Not on file  ?Physical Activity: Not on file  ?Stress: Not on file  ?Social Connections: Not on file  ? ?Family History  ?Adopted: Yes  ? ?- negative except otherwise stated in the family history section ?Allergies  ?Allergen Reactions  ? Codeine Itching  ? Glipizide Er [Glipizide]   ?  SOB, malaise. He tolerates XL without issues.  ? Metformin And Related Rash  ? ?Prior to Admission medications   ?Medication Sig Start Date End Date Taking? Authorizing Provider  ?Camphor-Eucalyptus-Menthol (VICKS VAPORUB EX) Apply 1 application. topically daily as needed (chest congestion).   Yes [provider]  ?glipiZIDE (GLIPIZIDE XL) 10 MG 24 hr tablet Take 2 tablets (20 mg total) by mouth 2 (two) times daily with a meal. ?Patient taking differently: Take 10 mg by mouth 2 (two) times daily with a meal. 05/08/21 11/23/21 Yes Sagardia, Ines Bloomer, MD  ?ibuprofen (ADVIL) 200 MG tablet Take 600 mg by mouth every 6 (six) hours as  needed.   Yes [provider]  ?insulin isophane & regular human KwikPen (NOVOLIN 70/30 KWIKPEN) (70-30) 100 UNIT/ML KwikPen Inject 14 Units into the skin 2 (two) times daily with a meal. 12/02/21  Yes Darrick Meigs, Marge Duncans, MD  ?Insulin Pen Needle (PEN NEEDLES 3/16") 31G X 5 MM MISC 14 Units by Does not apply route 2 (two) times daily with a meal. 12/02/21  Yes Oswald Hillock, MD  ?penicillin G IVPB Inject 24 Million Units into the vein daily. As a continuous infusion. Indication:  Strep pneumo bacteremia/septic  arthritis ?First Dose: Yes ?Last Day of Therapy:  01/03/22 ?Labs - Once weekly:  CBC/D and BMP, ?Labs - Every other week:  ESR and CRP ?Method of administration: Elastomeric (Continuous infusion) ?Method of administration may be changed at the discretion of home infusion pharmacist based upon assessment of the patient and/or caregiver's ability to self-administer the medication ordered. 12/01/21 01/03/22 Yes Lama, Marge Duncans, MD  ?Phenol (EQ SORE THROAT SPRAY MT) Use as directed 1 spray in the mouth or throat daily as needed (sore throat).   Yes [provider]  ?Phenyleph-Doxylamine-DM-APAP (TYLENOL COLD MULTI-SYMPTOM PO) Take 2 tablets by mouth every 6 (six) hours as needed (cold symptoms).   Yes [provider]  ?sildenafil (VIAGRA) 100 MG tablet Take 0.5-1 tablets (50-100 mg total) by mouth daily as needed for erectile dysfunction. 10/12/21  Yes Sagardia, Ines Bloomer, MD  ?acetaminophen (TYLENOL) 325 MG tablet Take 2 tablets (650 mg total) by mouth every 6 (six) hours as needed for mild pain, fever or headache (or Fever >/= 101). 12/02/21   Oswald Hillock, MD  ?apixaban (ELIQUIS) 5 MG TABS tablet Take 1 tablet (5 mg total) by mouth 2 (two) times daily. Take 10 mg po twice daily till Sunday 12/03/21, then start taking 5 mg po twice daily from Monday 12/04/21 12/04/21   Oswald Hillock, MD  ?benzonatate (TESSALON) 200 MG capsule Take 1 capsule (200 mg total) by mouth 3 (three) times daily as needed for cough. 12/02/21   Oswald Hillock, MD  ?dapagliflozin propanediol (FARXIGA) 10 MG TABS tablet Take 1 tablet (10 mg total) by mouth daily before breakfast. ?Patient not taking: Reported on 11/22/2021 10/12/21 01/10/22  Horald Pollen, MD  ?glucose blood (ACCU-CHEK AVIVA PLUS) test strip Use to test blood glucose daily. 07/20/21   Horald Pollen, MD  ?oxyCODONE (OXY IR/ROXICODONE) 5 MG immediate release tablet Take 1-2 tablets (5-10 mg total) by mouth every 4 (four) hours as needed for moderate pain or  severe pain. 12/02/21   Oswald Hillock, MD  ?polyethylene glycol (MIRALAX / GLYCOLAX) 17 g packet Take 17 g by mouth daily as needed. 12/02/21   Oswald Hillock, MD  ? ?MR KNEE LEFT W WO CONTRAST ? ?Result Date: 12/09/2021 ?CLINICAL DATA:  Bacteremia. Knee pain since end of April. Infection suspected. Thicker EXAM: MRI OF THE LEFT KNEE WITHOUT AND WITH CONTRAST TECHNIQUE: Multiplanar, multisequence MR imaging of the left knee was performed both before and after administration of intravenous contrast. CONTRAST:  22m GADAVIST GADOBUTROL 1 MMOL/ML IV SOLN COMPARISON:  None. FINDINGS: MENISCI Medial: Intact. Lateral: Intact. LIGAMENTS Cruciates: ACL and PCL are intact. Collaterals: Medial collateral ligament is intact. Lateral collateral ligament complex is intact. CARTILAGE Patellofemoral:  No chondral defect. Medial:  No chondral defect. Lateral:  No chondral defect. JOINT: Moderate joint effusion. Normal Hoffa's fat-pad. No plical thickening. POPLITEAL FOSSA: Popliteus tendon is intact. Small Baker's cyst. EXTENSOR MECHANISM: Intact quadriceps tendon.  Intact patellar tendon. Intact lateral patellar retinaculum. Intact medial patellar retinaculum. Intact MPFL. BONES: No aggressive osseous lesion. No fracture or dislocation. Other: There are peripherally enhancing fluid collections about the posterior aspect of the proximal tibia measuring approximately 2.2 x 0.9 x 3.5 cm another smaller fluid collection measuring approximately 0.3 x 1.7 cm. Another peripherally enhancing fluid collection on the medial aspect of the distal femur about the semi tendinosis tendon measuring approximately 1.7 x 1.2 x 1.8 cm. There is marked subcutaneous soft tissue edema and skin thickening consistent with cellulitis. IMPRESSION: 1. Moderate size suprapatellar joint effusion with synovial enhancement, which may be secondary to infectious/inflammatory process. 2. Two peripherally enhancing fluid collection about the posterior aspect of the  proximal tibia measuring at least 2.2 x 0.9 x 3.5 cm another collection measuring 0.3 x 1.7 cm. Another peripherally enhancing collection about the posteromedial aspect of the distal femur measuring approximately

## 2021-12-10 NOTE — Plan of Care (Signed)
?  Problem: Education: ?Goal: Knowledge of General Education information will improve ?Description: Including pain rating scale, medication(s)/side effects and non-pharmacologic comfort measures ?Outcome: Progressing ?  ?Problem: Health Behavior/Discharge Planning: ?Goal: Ability to manage health-related needs will improve ?Outcome: Progressing ?  ?Problem: Clinical Measurements: ?Goal: Ability to maintain clinical measurements within normal limits will improve ?Outcome: Progressing ?Goal: Diagnostic test results will improve ?Outcome: Progressing ?Goal: Respiratory complications will improve ?Outcome: Progressing ?Goal: Cardiovascular complication will be avoided ?Outcome: Progressing ?  ?Problem: Nutrition: ?Goal: Adequate nutrition will be maintained ?Outcome: Progressing ?  ?Problem: Coping: ?Goal: Level of anxiety will decrease ?Outcome: Progressing ?  ?Problem: Elimination: ?Goal: Will not experience complications related to bowel motility ?Outcome: Progressing ?Goal: Will not experience complications related to urinary retention ?Outcome: Progressing ?  ?Problem: Pain Managment: ?Goal: General experience of comfort will improve ?Outcome: Progressing ?  ?Problem: Safety: ?Goal: Ability to remain free from injury will improve ?Outcome: Progressing ?  ?Problem: Skin Integrity: ?Goal: Risk for impaired skin integrity will decrease ?Outcome: Progressing ?  ?

## 2021-12-10 NOTE — Progress Notes (Signed)
? ? ? ? ? ? ?Subjective: ?Complaining of pain in his arm and his knee and leg ? ? ?Antibiotics:  ?Anti-infectives (From admission, onward)  ? ? Start     Dose/Rate Route Frequency Ordered Stop  ? 12/04/21 1930  penicillin G potassium 12 Million Units in dextrose 5 % 500 mL continuous infusion  Status:  Discontinued       ? 12 Million Units ?41.7 mL/hr over 12 Hours Intravenous Every 12 hours 12/04/21 1835 12/04/21 1928  ? 12/04/21 1000  vancomycin (VANCOCIN) IVPB 1000 mg/200 mL premix       ? 1,000 mg ?200 mL/hr over 60 Minutes Intravenous On call to O.R. 12/04/21 0958 12/04/21 1443  ? 12/01/21 0000  penicillin G IVPB       ? 24 Million Units Intravenous Every 24 hours 12/01/21 1303 01/03/22 2359  ? 11/27/21 1000  penicillin G potassium 12 Million Units in dextrose 5 % 500 mL continuous infusion       ? 12 Million Units ?41.7 mL/hr over 12 Hours Intravenous Every 12 hours 11/27/21 0901    ? 11/23/21 2000  vancomycin (VANCOREADY) IVPB 1750 mg/350 mL  Status:  Discontinued       ? 1,750 mg ?175 mL/hr over 120 Minutes Intravenous Every 24 hours 11/22/21 1943 11/23/21 0931  ? 11/22/21 2300  cefTRIAXone (ROCEPHIN) 2 g in sodium chloride 0.9 % 100 mL IVPB  Status:  Discontinued       ? 2 g ?200 mL/hr over 30 Minutes Intravenous Every 24 hours 11/22/21 1923 11/27/21 0901  ? 11/22/21 2000  azithromycin (ZITHROMAX) 500 mg in sodium chloride 0.9 % 250 mL IVPB  Status:  Discontinued       ? 500 mg ?250 mL/hr over 60 Minutes Intravenous Every 24 hours 11/22/21 1923 11/23/21 0931  ? 11/22/21 1830  ceFEPIme (MAXIPIME) 2 g in sodium chloride 0.9 % 100 mL IVPB       ? 2 g ?200 mL/hr over 30 Minutes Intravenous  Once 11/22/21 1825 11/22/21 1949  ? 11/22/21 1830  vancomycin (VANCOREADY) IVPB 1750 mg/350 mL       ? 1,750 mg ?175 mL/hr over 120 Minutes Intravenous  Once 11/22/21 1825 11/22/21 2147  ? ?  ? ? ?Medications: ?Scheduled Meds: ? bisacodyl  10 mg Oral Daily  ? Chlorhexidine Gluconate Cloth  6 each Topical Daily  ? insulin  aspart  0-15 Units Subcutaneous TID WC  ? insulin aspart  0-5 Units Subcutaneous QHS  ? insulin aspart  8 Units Subcutaneous TID WC  ? insulin glargine-yfgn  40 Units Subcutaneous Daily  ? lidocaine  1 patch Transdermal Q24H  ? multivitamin with minerals  1 tablet Oral Daily  ? polyethylene glycol  17 g Oral BID  ? Ensure Max Protein  11 oz Oral BID  ? senna-docusate  1 tablet Oral QHS  ? senna-docusate  2 tablet Oral Daily  ? simethicone  80 mg Oral QID  ? ?Continuous Infusions: ? heparin 1,550 Units/hr (12/10/21 0737)  ? penicillin g continuous IV infusion 12 Million Units (12/10/21 0513)  ? ?PRN Meds:.acetaminophen **OR** [DISCONTINUED] acetaminophen, alum & mag hydroxide-simeth, benzonatate, bisacodyl, fentaNYL (SUBLIMAZE) injection, hydrALAZINE, methocarbamol, naLOXone (NARCAN)  injection, ondansetron (ZOFRAN) IV, oxyCODONE, oxyCODONE, sodium chloride flush ? ? ? ?Objective: ?Weight change:  ? ?Intake/Output Summary (Last 24 hours) at 12/10/2021 1147 ?Last data filed at 12/10/2021 0800 ?Gross per 24 hour  ?Intake 720 ml  ?Output 2050 ml  ?Net -1330 ml  ? ?Blood  pressure 121/82, pulse 86, temperature 97.9 ?F (36.6 ?C), temperature source Oral, resp. rate 13, height 5\' 4"  (1.626 m), weight 81.6 kg, SpO2 97 %. ?Temp:  [97.9 ?F (36.6 ?C)-98.3 ?F (36.8 ?C)] 97.9 ?F (36.6 ?C) (04/23 1115) ?Pulse Rate:  [79-99] 86 (04/23 1115) ?Resp:  [13-18] 13 (04/23 1115) ?BP: (121-143)/(73-93) 121/82 (04/23 1115) ?SpO2:  [95 %-98 %] 97 % (04/23 1115) ? ?Physical Exam: ?Physical Exam ?Constitutional:   ?   Appearance: He is well-developed.  ?HENT:  ?   Head: Normocephalic and atraumatic.  ?Eyes:  ?   Conjunctiva/sclera: Conjunctivae normal.  ?Cardiovascular:  ?   Rate and Rhythm: Normal rate and regular rhythm.  ?Pulmonary:  ?   Effort: Pulmonary effort is normal. No respiratory distress.  ?   Breath sounds: Normal breath sounds. No stridor. No wheezing.  ?Chest:  ?   Comments: Vacuum dressing in place on left side ?Abdominal:  ?    General: There is no distension.  ?   Palpations: Abdomen is soft.  ?Musculoskeletal:  ?   Right shoulder: Decreased range of motion.  ?   Cervical back: Normal range of motion and neck supple.  ?   Left knee: Swelling and effusion present. Decreased range of motion. Tenderness present.  ?Skin: ?   General: Skin is warm and dry.  ?   Findings: No erythema or rash.  ?Neurological:  ?   General: No focal deficit present.  ?   Mental Status: He is alert and oriented to person, place, and time.  ?Psychiatric:     ?   Mood and Affect: Mood normal.     ?   Behavior: Behavior normal.     ?   Thought Content: Thought content normal.     ?   Judgment: Judgment normal.  ?  ? ?CBC: ? ? ? ?BMET ?Recent Labs  ?  12/09/21 ?0400 12/10/21 ?0510  ?NA 134* 135  ?K 3.5 3.6  ?CL 100 101  ?CO2 28 29  ?GLUCOSE 156* 101*  ?BUN <5* <5*  ?CREATININE 0.62 0.57*  ?CALCIUM 7.7* 8.1*  ? ? ? ?Liver Panel ? ?Recent Labs  ?  12/09/21 ?0400 12/10/21 ?0510  ?PROT 5.3* 5.7*  ?ALBUMIN <1.5* <1.5*  ?AST 32 22  ?ALT 29 25  ?ALKPHOS 177* 170*  ?BILITOT 0.5 0.7  ? ? ? ? ? ?Sedimentation Rate ?No results for input(s): ESRSEDRATE in the last 72 hours. ?C-Reactive Protein ?No results for input(s): CRP in the last 72 hours. ? ?Micro Results: ?Recent Results (from the past 720 hour(s))  ?Blood culture (routine x 2)     Status: Abnormal  ? Collection Time: 11/22/21  4:10 PM  ? Specimen: BLOOD  ?Result Value Ref Range Status  ? Specimen Description   Final  ?  BLOOD BLOOD LEFT FOREARM ?Performed at [facility], [address] [place], [place] ?  ? Special Requests   Final  ?  BOTTLES DRAWN AEROBIC AND ANAEROBIC Blood Culture results may not be optimal due to an inadequate volume of blood received in culture bottles ?Performed at [facility], [address] [place], [place] ?  ? Culture  Setup Time   Final  ?  GRAM POSITIVE COCCI ?IN BOTH AEROBIC AND ANAEROBIC BOTTLES ?Organism ID to follow ?CRITICAL  RESULT CALLED TO, READ BACK BY AND VERIFIED [id number] PHARMD, AT [phone] 11/23/21 D. VANHOOK ?Performed at [facility], [address], [place],  [place] [id number] ?  ? Culture STREPTOCOCCUS PNEUMONIAE (A)  Final  ? Report Status 11/25/2021 FINAL  Final  ? Organism ID, Bacteria STREPTOCOCCUS PNEUMONIAE  Final  ?    Susceptibility  ? Streptococcus pneumoniae - MIC*  ?  ERYTHROMYCIN >=8 RESISTANT Resistant   ?  LEVOFLOXACIN 0.5 SENSITIVE Sensitive   ?  VANCOMYCIN 0.5 SENSITIVE Sensitive   ?  PENICILLIN (meningitis) <=0.06 SENSITIVE Sensitive   ?  PENO - penicillin <=0.06    ?  PENICILLIN (non-meningitis) <=0.06 SENSITIVE Sensitive   ?  PENICILLIN (oral) <=0.06 SENSITIVE Sensitive   ?  CEFTRIAXONE (non-meningitis) <=0.12 SENSITIVE Sensitive   ?  CEFTRIAXONE (meningitis) <=0.12 SENSITIVE Sensitive   ?  * STREPTOCOCCUS PNEUMONIAE  ?Blood Culture ID Panel (Reflexed)     Status: Abnormal  ? Collection Time: 11/22/21  4:10 PM  ?Result Value Ref Range Status  ? Enterococcus faecalis NOT DETECTED NOT DETECTED Final  ? Enterococcus Faecium NOT DETECTED NOT DETECTED Final  ? Listeria monocytogenes NOT DETECTED NOT DETECTED Final  ? Staphylococcus species NOT DETECTED NOT DETECTED Final  ? Staphylococcus aureus (BCID) NOT DETECTED NOT DETECTED Final  ? Staphylococcus epidermidis NOT DETECTED NOT DETECTED Final  ? Staphylococcus lugdunensis NOT DETECTED NOT DETECTED Final  ? Streptococcus species DETECTED (A) NOT DETECTED Final  ?  Comment: CRITICAL RESULT CALLED TO, READ BACK BY AND VERIFIED WITH: Haze Boyden?N. GLOGOVAC PHARMD, AT [phone] 11/23/21 D. VANHOOK ?  ? Streptococcus agalactiae NOT DETECTED NOT DETECTED Final  ? Streptococcus pneumoniae DETECTED (A) NOT DETECTED Final  ?  Comment: CRITICAL RESULT CALLED TO, READ BACK BY AND VERIFIED WITH: Haze Boyden?N. GLOGOVAC PHARMD, AT [phone] 11/23/21 D. VANHOOK ?  ? Streptococcus pyogenes NOT DETECTED NOT DETECTED Final  ? A.calcoaceticus-baumannii NOT DETECTED NOT DETECTED Final  ? Bacteroides  fragilis NOT DETECTED NOT DETECTED Final  ? Enterobacterales NOT DETECTED NOT DETECTED Final  ? Enterobacter cloacae complex NOT DETECTED NOT DETECTED Final  ? Escherichia coli NOT DETECTED NOT DETECTED Final

## 2021-12-11 ENCOUNTER — Telehealth: Payer: Self-pay | Admitting: Hematology

## 2021-12-11 DIAGNOSIS — A419 Sepsis, unspecified organism: Secondary | ICD-10-CM | POA: Diagnosis not present

## 2021-12-11 DIAGNOSIS — R6520 Severe sepsis without septic shock: Secondary | ICD-10-CM | POA: Diagnosis not present

## 2021-12-11 DIAGNOSIS — J189 Pneumonia, unspecified organism: Secondary | ICD-10-CM | POA: Diagnosis not present

## 2021-12-11 LAB — CBC WITH DIFFERENTIAL/PLATELET
Abs Immature Granulocytes: 0.02 10*3/uL (ref 0.00–0.07)
Basophils Absolute: 0 10*3/uL (ref 0.0–0.1)
Basophils Relative: 0 %
Eosinophils Absolute: 0.1 10*3/uL (ref 0.0–0.5)
Eosinophils Relative: 1 %
HCT: 26.6 % — ABNORMAL LOW (ref 39.0–52.0)
Hemoglobin: 8.7 g/dL — ABNORMAL LOW (ref 13.0–17.0)
Immature Granulocytes: 0 %
Lymphocytes Relative: 29 %
Lymphs Abs: 1.3 10*3/uL (ref 0.7–4.0)
MCH: 29.5 pg (ref 26.0–34.0)
MCHC: 32.7 g/dL (ref 30.0–36.0)
MCV: 90.2 fL (ref 80.0–100.0)
Monocytes Absolute: 0.5 10*3/uL (ref 0.1–1.0)
Monocytes Relative: 12 %
Neutro Abs: 2.6 10*3/uL (ref 1.7–7.7)
Neutrophils Relative %: 58 %
Platelets: 260 10*3/uL (ref 150–400)
RBC: 2.95 MIL/uL — ABNORMAL LOW (ref 4.22–5.81)
RDW: 13.4 % (ref 11.5–15.5)
WBC: 4.6 10*3/uL (ref 4.0–10.5)
nRBC: 0 % (ref 0.0–0.2)

## 2021-12-11 LAB — GLUCOSE, CAPILLARY
Glucose-Capillary: 162 mg/dL — ABNORMAL HIGH (ref 70–99)
Glucose-Capillary: 102 mg/dL — ABNORMAL HIGH (ref 70–99)
Glucose-Capillary: 195 mg/dL — ABNORMAL HIGH (ref 70–99)
Glucose-Capillary: 159 mg/dL — ABNORMAL HIGH (ref 70–99)

## 2021-12-11 LAB — COMPREHENSIVE METABOLIC PANEL
ALT: 20 U/L (ref 0–44)
AST: 19 U/L (ref 15–41)
Albumin: <1.5 g/dL — ABNORMAL LOW (ref 3.5–5.0)
Alkaline Phosphatase: 152 U/L — ABNORMAL HIGH (ref 38–126)
Anion gap: 6 (ref 5–15)
BUN: 5 mg/dL — ABNORMAL LOW (ref 8–23)
CO2: 30 mmol/L (ref 22–32)
Calcium: 8.2 mg/dL — ABNORMAL LOW (ref 8.9–10.3)
Chloride: 99 mmol/L (ref 98–111)
Creatinine, Ser: 0.76 mg/dL (ref 0.61–1.24)
GFR, Estimated: >60 mL/min (ref >60)
Glucose, Bld: 137 mg/dL — ABNORMAL HIGH (ref 70–99)
Potassium: 3.9 mmol/L (ref 3.5–5.1)
Sodium: 135 mmol/L (ref 135–145)
Total Bilirubin: 0.6 mg/dL (ref 0.3–1.2)
Total Protein: 5.5 g/dL — ABNORMAL LOW (ref 6.5–8.1)

## 2021-12-11 LAB — MAGNESIUM: Magnesium: 1.6 mg/dL — ABNORMAL LOW (ref 1.7–2.4)

## 2021-12-11 LAB — HEPARIN LEVEL (UNFRACTIONATED): Heparin Unfractionated: 0.36 IU/mL (ref 0.30–0.70)

## 2021-12-11 LAB — PHOSPHORUS: Phosphorus: 3.5 mg/dL (ref 2.5–4.6)

## 2021-12-11 MED ORDER — CELECOXIB 200 MG PO CAPS
200.0000 mg | ORAL_CAPSULE | Freq: Every day | ORAL | Status: DC
  Administered 2021-12-11 – 2021-12-18 (×8): 200 mg via ORAL
  Filled 2021-12-11 (×8): qty 1

## 2021-12-11 MED ORDER — DIPHENHYDRAMINE HCL 25 MG PO CAPS
25.0000 mg | ORAL_CAPSULE | Freq: Three times a day (TID) | ORAL | Status: DC | PRN
  Administered 2021-12-11: 25 mg via ORAL
  Filled 2021-12-11: qty 1

## 2021-12-11 MED ORDER — DIPHENHYDRAMINE-ZINC ACETATE 2-0.1 % EX CREA
TOPICAL_CREAM | Freq: Two times a day (BID) | CUTANEOUS | Status: DC | PRN
  Filled 2021-12-11: qty 28

## 2021-12-11 MED ORDER — CEFTRIAXONE IV (FOR PTA / DISCHARGE USE ONLY): 2.0000 g | INTRAVENOUS | 0 refills | Status: DC

## 2021-12-11 MED ORDER — MAGNESIUM SULFATE 2 GM/50ML IV SOLN
2.0000 g | Freq: Once | INTRAVENOUS | Status: AC
  Administered 2021-12-11: 2 g via INTRAVENOUS
  Filled 2021-12-11: qty 50

## 2021-12-11 NOTE — Telephone Encounter (Signed)
Scheduled appt per 4/13 staff msg from Dr. Burr Medico. Spoke to pt who asked for me to f/u with him in a few weeks once he's been discharged from the hospital. Will f/u once he's been discharged per request.  ?

## 2021-12-11 NOTE — Progress Notes (Signed)
Occupational Therapy Treatment ?Patient Details ?Name: David Strong ?MRN: [id number] ?DOB: Nov 07, 1955 ?Today's Date: 12/11/2021 ? ? ?History of present illness Pt. is a 66 y.o. M presenting to [facility] 4/5 Admitted for severe sepsis, POA due to streptococcal pneumonia with bacteremia, left sternoclavicular joint septic arthritis and other large joint arthralgia/arthritis (right shoulder, left knee). Pt underwent I&D of [place] joint with wound vac placement on 4/17. PMH significant for T2DM.  CTA showed no PE, confirmed multifocal pneumonia and MRI shows left sternoclavicular joint inflammatory changes with scattered foci of air within the soft tissues and sternoclavicular joint space compatible with localized left sternoclavicular septic arthritis/cellulitis, without evidence of drainable fluid collection or large joint effusion.  Subsequent BCID and urine antigen positive for strep.  Antibiotics narrowed to IV ceftriaxone.  IR ultrasound shows +RUE superficial thrombosis and L LE DVT. ?  ?OT comments ? Session focused on continued ROM and functional use of R dominant UE. Pt with continued edema R > L and decreased ability to flex/abduct shoulder. Provided handout of AROM/self ROM of R UE, squeeze ball to maximize grasp and edema mgmt strategies. DC recs remain appropriate.   ? ?Recommendations for follow up therapy are one component of a multi-disciplinary discharge planning process, led by the attending physician.  Recommendations may be updated based on patient status, additional functional criteria and insurance authorization. ?   ?Follow Up Recommendations ? Home health OT  ?  ?Assistance Recommended at Discharge Intermittent Supervision/Assistance  ?Patient can return home with the following ? A little help with bathing/dressing/bathroom;Assistance with cooking/housework;Help with stairs or ramp for entrance;Assist for transportation ?  ?Equipment Recommendations ? None recommended by OT  ?  ?Recommendations for Other  Services   ? ?  ?Precautions / Restrictions Precautions ?Precautions: Fall;Other (comment) ?Precaution Comments: wound vac ?Restrictions ?Weight Bearing Restrictions: No  ? ? ?  ? ?Mobility Bed Mobility ?Overal bed mobility: Needs Assistance ?Bed Mobility: Sit to Supine ?  ?  ?Supine to sit: Supervision ?  ?  ?General bed mobility comments: for line safety, cueing for problem solving lines ?  ? ?Transfers ?Overall transfer level: Needs assistance ?Equipment used: None ?Transfers: Sit to/from Stand, Bed to chair/wheelchair/BSC ?Sit to Stand: Supervision ?  ?  ?Step pivot transfers: Min guard ?  ?  ?General transfer comment: min guard for safety in turning back to bed with cues for problem solving line mgmt ?  ?  ?Balance Overall balance assessment: Needs assistance ?Sitting-balance support: Feet supported ?Sitting balance-Leahy Scale: Good ?  ?  ?Standing balance support: No upper extremity supported, During functional activity ?Standing balance-Leahy Scale: Fair ?Standing balance comment: fair static standing, unsteadiness with dynamic tasks without AD ?  ?  ?  ?  ?  ?  ?  ?  ?  ?  ?  ?   ? ?ADL either performed or assessed with clinical judgement  ? ?ADL Overall ADL's : Needs assistance/impaired ?  ?  ?  ?  ?Upper Body Bathing: Minimal assistance;Sitting ?Upper Body Bathing Details (indicate cue type and reason): able to wash UE fairly well, will need assist with back ?  ?  ?  ?  ?  ?  ?  ?  ?  ?Toileting - Clothing Manipulation Details (indicate cue type and reason): Setup with urinal use ?  ?  ?  ?General ADL Comments: Emphasis on UE HEP, written handout and squeeze ball provided ?  ? ?Extremity/Trunk Assessment Upper Extremity Assessment ?Upper Extremity Assessment: RUE deficits/detail;LUE deficits/detail ?RUE  Deficits / Details: hand, wrist and elbow ROM WFL. difficulty lifting to 35* shoulder flex, difficlty with abduction attempts. edema ?RUE Coordination: WNL ?LUE Deficits / Details: hand, wrist and elbow  ROM WFL. able to lift UE to 90* before soreness (post op from wound vac felt and tightness with wound vac dressing) ?LUE Coordination: WNL ?  ?Lower Extremity Assessment ?Lower Extremity Assessment: Defer to PT evaluation ?  ?  ?  ? ?Vision   ?Vision Assessment?: No apparent visual deficits ?  ?Perception   ?  ?Praxis   ?  ? ?Cognition Arousal/Alertness: Awake/alert ?Behavior [facility]: [facility] tasks assessed/performed ?Overall Cognitive Status: No family/caregiver present to determine baseline cognitive functioning ?  ?  ?  ?  ?  ?  ?  ?  ?  ?  ?  ?  ?  ?  ?  ?  ?General Comments: more receptive to education this session ?  ?  ?   ?Exercises Exercises: Other exercises ?Other Exercises ?Other Exercises: AROM from hand to shoulder within tolerance; gentle self ROM to R shoulder ?Other Exercises: squeeze ball for grasp strength, decreased edema ? ?  ?Shoulder Instructions   ? ? ?  ?General Comments    ? ? ?Pertinent Vitals/ Pain       Pain Assessment ?Pain Assessment: Faces ?Faces Pain Scale: Hurts a little bit ?Pain Location: R UE ?Pain Descriptors / Indicators: Sore ?Pain Intervention(s): Monitored during session ? ?Home Living   ?  ?  ?  ?  ?  ?  ?  ?  ?  ?  ?  ?  ?  ?  ?  ?  ?  ?  ? ?  ?Prior Functioning/Environment    ?  ?  ?  ?   ? ?Frequency ? Min 2X/week  ? ? ? ? ?  ?Progress Toward Goals ? ?OT Goals(current goals can now be found in the care plan section) ? Progress towards OT goals: Progressing toward goals ? ?Acute Rehab OT Goals ?Patient Stated Goal: decrease edema, get back to work ?OT Goal Formulation: With patient ?Time For Goal Achievement: 12/24/21 ?Potential to Achieve Goals: Good ?ADL Goals ?Pt Will Perform Upper Body Dressing: with modified independence ?Pt/caregiver will Perform Home Exercise Program: Right Upper extremity  ?Plan Discharge plan remains appropriate   ? ?Co-evaluation ? ? ?   ?  ?  ?  ?  ? ?  ?AM-PAC OT "6 Clicks" Daily Activity     ?Outcome Measure ? ? Help from another person  eating meals?: None ?Help from another person taking care of personal grooming?: A Little ?Help from another person toileting, which includes using toliet, bedpan, or urinal?: A Little ?Help from another person bathing (including washing, rinsing, drying)?: A Little ?Help from another person to put on and taking off regular upper body clothing?: A Little ?Help from another person to put on and taking off regular lower body clothing?: A Little ?6 Click Score: 19 ? ?  ?End of Session   ? ?OT Visit Diagnosis: Pain ?Pain - Right/Left: Right ?Pain - part of body: Shoulder ?  ?Activity Tolerance Patient tolerated treatment well ?  ?Patient Left in bed;with call bell/phone within reach ?  ?Nurse Communication   ?  ? ?   ? ?Time: 4098-11911314-1340 ?OT Time Calculation (min): 26 min ? ?Charges: OT General Charges ?$OT Visit: 1 Visit ?OT Treatments ?$Therapeutic Activity: 8-22 mins ?$Therapeutic Exercise: 8-22 mins ? ?Bradd CanaryJulie B, OTR/L ?Acute Rehab Services ?  Office: [phone]  ? ?Lorre Munroe ?12/11/2021, 2:55 PM ?

## 2021-12-11 NOTE — Progress Notes (Signed)
? ? ? [facility] ?                                                                            ? ? ?Marcello Tuzzolino, is a 66 y.o. male, DOB - [birth year], IWO:[id number] ?Admit date - 11/22/2021    ?Outpatient Primary MD for the patient is [name], MD ? ?LOS - 19  days ? ? ? ?Brief summary  ? ?66 year old male with medical history of diabetes mellitus type 2 presented to the ED with complaints of productive cough, subjective fever, chills, myalgia, severe arthralgia, new onset erythema and swelling of the left side of the base of the neck.  CTA showed no PE, confirmed right middle lobe pneumonia and bibasilar airspace opacities consistent with multifocal pneumonia and left sternoclavicular joint inflammatory changes with scattered foci of air within the soft tissue and sternoclavicular joint space compatible with left localized sternoclavicular septic arthritis/cellulitis, without evidence of drainable fluid collection or large joint effusion.  Subsequent BC ID and urine antigen positive for strep.  Antibiotics were narrowed down to ceftriaxone.  Newly diagnosed right upper arm SVT and left lower extremity DVT, briefly on IV heparin transition to Eliquis . ?MRI right shoulder findings suspicious for septic arthritis involving AC joint and glenohumeral joint.  Severe myositis and pyomyositis involving the subscapularis muscle, the anterior deltoid muscle and distal aspect of pectoralis muscle.  There is also myositis involving the infraspinatus muscle.   ?  ?Patient was seen by orthopedic surgery, no fluid could be aspirated on joint aspiration from the right shoulder.  He was seen by infectious disease and TEE was recommended, however patient did not want to get inpatient TEE so outpatient TEE was scheduled.  ID recommended to discharge home on 6 weeks of penicillin.  Home health RN/PT was arranged for IV antibiotics.  Eliquis also was sent to the pharmacy. ?  ?Before discharge patient complained of  worsening swelling of right chest wall, crepitus was palpated with likely subluxation of left clavicle at the sternoclavicular joint.  CT chest with contrast was obtained which showed progressive anterior subluxation of the left sternoclavicular joint with new erosive changes with bony fragmentation along the medial articular surface of the proximal left clavicle.  Findings on 7 4 septic arthritis.  There was progressive fluid and less collection centered on the left supraclavicular joint which extended to the left side of the neck.  Also Vollman to the left pectoralis muscle containing foci of gas and fluid.  Findings consistent with abscess. ?  ?CVTS was consulted, Dr. Roxan Hockey saw the patient and recommended transfer to [facility] for incision and drainage. ?Pt underwent s/p I&D on 12/05/21 and on 12/07/21. TEE is negative for vegetations, MRI of the knee done, showed evidence of fluid within the left knee as well as several multifocal abscesses involving the posterior soft tissues of the left knee. Orthopedics consulted and recommendations given.  ? ?  ?  ? ? ?Assessment & Plan  ? ? ?Assessment and Plan: ?* Severe sepsis ([place]) ?Due to streptococcal multifocal pneumonia with bacteremia, presumed left sternoclavicular joint septic arthritis left supraclavicular abscess and other polyarthralgia/arthritis (right shoulder, left knee) ?Met sepsis criteria on admission including  persistent tachypnea, tachycardia and did spike a fever of 100.6 overnight of admission.  CTA chest confirmed multifocal pneumonia.   ?Empirically started on IV vancomycin, ceftriaxone and azithromycin. ?BCID and urine positive for Streptococcus.  ID consulted and his antibiotics narrowed to penicillin G on 4/10 plan for 6 weeks of antibiotics till 01/03/2022 ?TTE: LVEF 60-65%.  No evidence of valvular vegetations.  Surveillance blood cultures 4/7: NTD ?TEE is negative for endocarditis.  ? Outpatient follow up with ID.  ?Sepsis physiology has  resolved but still with significant arthralgias/arthritis of above joints.   ?Pt was transferred to [facility] per cardiothoracic surgery request and he underwent irrigation and debridement of the left sternoclavicular joint abscess on 12/04/21 and on 12/07/21. ?Cultures from the debridement are pending.  ?MRI of the knee done, showed evidence of fluid within the left knee as well as several multifocal abscesses involving the posterior soft tissues of the left knee. Orthopedics consulted and recommendations given.  ? ? ?Septic arthritis ([place]) ?Left sternoclavicular joint septic arthritis ?Has other polyarthralgias/arthritis (right shoulder and left knee) ?See above. Left ankle swelling and left knee swelling with some cellulitis changes. Joint aspiration was attempted however no fluid obtained.   ?MRI of the left knee is pending.  ?Patient concerned about ankle edema, pt denies any pain in the ankles. Left knee swollen and tender and MRI is pending.  ?Pt has generalized body wall edema probably form hypoalbuminemia. Prn lasix . He diuresed about 4 lit yesterday. Creatinine remains stable.  ? ? ? ?DVT (deep venous thrombosis) ([place]) ? ?Left lower extremity: Age-indeterminate DVT left popliteal vein, acute DVT left gastrocnemius and left posterior tibial veins. ?Currently on IV heparin, transition to eliquis later today.  ?Due to complaint of bilateral leg pain and swelling, obtained RLE venous Dopplers which were negative for DVT or SVT. ?Hematology consulted for further evaluation in view of hypoalbuminemia, DVT.  ?Hematology suggested at least 3 months of anti coagulation and outpatient follow up with hematology for anti coagulopathy work up.  ? ?Transaminitis ?Mild and likely related to severe sepsis. ?No GI symptoms. ?RUQ ultrasound with gallbladder sludge and no acute findings. ?Continue to trend daily CMP. ? ?High anion gap metabolic acidosis ?Lactic acidosis ?Secondary to severe sepsis. ?Bicarbonate normal.  Anion gap  has normalized. ?Resolved. ? ?Thrombocytopenia ([place]) ?Suspect chronic or acute on chronic, probably from sepsis.  ?Platelet counts in 2018: 104 ?Resolved. ? ?Type 2 diabetes mellitus ([place]) ?A1c 8 on 10/12/2021. ?On glipizide and dapagliflozin at home.  Currently held. ?CBGs uncontrolled and fluctuating.   ? ?Recent Labs  ?  12/11/21 ?0645 12/11/21 ?1108 12/11/21 ?1634  ?GLUCAP 162* 102* 195*  ? ? ?Decrease semglee to 35 unts and continue with novolog 8 units tidac and SSI.  No change in meds.  ?No changes in meds today.  ? ? ?Hypokalemia/Hypomagnesemia  ?Replaced.  Recheck in am.  ? ?Hypophosphatemia ?Replaced. ? ?Bilateral leg edema ?Likely due to IV fluids early on in admission and hypoalbuminemic state. ?Also has left lower extremity DVT. Prn lasix.  ? ?Constipation ?Aggressive bowel regimen. ?BMs documented. ? ?Hyponatremia:  ?Mild , stable.  ?  ? ?Acute anemia of blood loss probably from debridement:  ?Anemia of acute illness.  ?His hemoglobin was normal on admission around 14.5, dropped to 9  and to 8.7.  ?Continue to monitor.  ? ? ? ?Hypomagnesemia:  ?Replaced.  ? ? ?Right elbow swelling and tenderness:  ?X rays of the elbow ordered and pending.  ?  ? ?Malnutrition  Type: ? ?Nutrition Problem: Increased nutrient needs ?Etiology: acute illness ? ? ?Malnutrition Characteristics: ? ?Signs/Symptoms: estimated needs ? ? ?Nutrition Interventions: ? ?Interventions: MVI, Premier Protein, Other (Comment) (double protein) ? ?Estimated body mass index is 30.2 kg/m? as calculated from the following: ?  Height as of this encounter: _0  (1.626 m). ?  Weight as of this encounter: 79.8 kg. ? ?Code Status: Full code.  ?DVT Prophylaxis:  Place TED hose Start: 12/08/21 1637 ?SCD's Start: 12/04/21 1836 ?SCDs Start: 11/22/21 1920 ? ? ?Level of Care: Level of care: Progressive ?Family Communication: None at bedside.  ? ?Disposition Plan:     Remains inpatient appropriate:  INCISION AND DEBRIDEMENT ? ?Procedures:  ?IRRIGATION AND  DEBRIDMENT OF STERNOCLAVICULAR WOUND, WOUND VAC PLACEMENT on the left.Marland Kitchen ?Consultants:   ?Cardiothoracic surgery.  ? ?Antimicrobials:  ? ?Anti-infectives (From admission, onward)  ? ? Start     Dose/Rate

## 2021-12-11 NOTE — Progress Notes (Addendum)
?  [facility] ? ? ?Reason for visit: Follow up on disseminated Strep pneumonia infection ? ?Interval History: complaints of his knee keeping him awake at night due to the pain; WBC wnl.  ?Day 20 total antibiotics ? ? ?Physical Exam: ?Constitutional:  ?Vitals:  ? 12/11/21 0759 12/11/21 1106  ?BP: 135/82 (!) 139/98  ?Pulse: 94 83  ?Resp: 16 20  ?Temp: 97.7 ?F (36.5 ?C) 97.8 ?F (36.6 ?C)  ?SpO2: 99% 99%  ? patient appears in NAD ?Respiratory: Normal respiratory effort ?Cardiovascular: RRR ?Chest: VAC in place ? ?Review of Systems: ?Constitutional: negative for fevers and chills ?Gastrointestinal: negative for nausea and diarrhea ? ?Lab Results  ?Component Value Date  ? WBC 4.6 12/11/2021  ? HGB 8.7 (L) 12/11/2021  ? HCT 26.6 (L) 12/11/2021  ? MCV 90.2 12/11/2021  ? PLT 260 12/11/2021  ?  ?Lab Results  ?Component Value Date  ? CREATININE 0.76 12/11/2021  ? BUN 5 (L) 12/11/2021  ? NA 135 12/11/2021  ? K 3.9 12/11/2021  ? CL 99 12/11/2021  ? CO2 30 12/11/2021  ?  ?Lab Results  ?Component Value Date  ? ALT 20 12/11/2021  ? AST 19 12/11/2021  ? GGT 141 (H) 11/23/2021  ? ALKPHOS 152 (H) 12/11/2021  ?  ? ?Microbiology: ?Recent Results (from the past 240 hour(s))  ?Aerobic/Anaerobic Culture w Gram Stain (surgical/deep wound)     Status: None  ? Collection Time: 12/04/21  2:41 PM  ? Specimen: PATH Other; Body Fluid  ?Result Value Ref Range Status  ? Specimen Description FLUID  Final  ? Special Requests LEFT sTERNOCLAVICULAR JOINT SPEC B  Final  ? Gram Stain   Final  ?  FEW WBC PRESENT,BOTH PMN AND MONONUCLEAR ?NO ORGANISMS SEEN ?  ? Culture   Final  ?  No growth aerobically or anaerobically. ?Performed at [facility], [place] [address] [place], [place] [address] ?  ? Report Status 12/09/2021 FINAL  Final  ?Fungus Culture With Stain     Status: None (Preliminary result)  ? Collection Time: 12/04/21  2:43 PM  ? Specimen: PATH Other; Body Fluid  ?Result Value Ref Range Status  ? Fungus Stain Final  report  Final  ?  Comment: (NOTE) ?Performed At: [facility] ?[address] [place], [place] [id number] ?Rush Farmer MD [id number] ?  ? Fungus (Mycology) Culture PENDING  Incomplete  ? Fungal Source ABSCESS  Final  ?  Comment: LEFT STERNOCLAVICULAR JOINT ?Performed at [facility], [place] [address] [place], [place] [address] ?  ?Aerobic/Anaerobic Culture w Gram Stain (surgical/deep wound)     Status: None  ? Collection Time: 12/04/21  2:43 PM  ? Specimen: PATH Other; Body Fluid  ?Result Value Ref Range Status  ? Specimen Description ABSCESS  Final  ? Special Requests LEFT STERNOCLAVICULAR JOINT SWAB SPEC A  Final  ? Gram Stain   Final  ?  FEW WBC PRESENT, PREDOMINANTLY PMN ?NO ORGANISMS SEEN ?  ? Culture   Final  ?  No growth aerobically or anaerobically. ?Performed at [facility], [place] [address], [place], [place] [address] ?  ? Report Status 12/09/2021 FINAL  Final  ?Fungus Culture Result     Status: None  ? Collection Time: 12/04/21  2:43 PM  ?Result Value Ref Range Status  ? Result 1 Comment  Final  ?  Comment: (NOTE) ?KOH/Calcofluor preparation:  no fungus observed. ?Performed At: [facility] ?[place],  [place] [id number] ?Rush Farmer MD [id number] ?  ?Aerobic/Anaerobic Culture w Gram Stain (surgical/deep wound)     Status: None (Preliminary result)  ? Collection Time: 12/07/21 12:26 PM  ? Specimen: PATH Other; Tissue  ?Result Value Ref Range Status  ? Specimen Description ASCITIC  Final  ? Special Requests STERNOVACULIAR JOINT WOUND SPEC A  Final  ? Gram Stain NO WBC SEEN ?NO ORGANISMS SEEN ?  Final  ? Culture   Final  ?  NO GROWTH 4 DAYS NO ANAEROBES ISOLATED; CULTURE IN PROGRESS FOR 5 DAYS ?Performed at [facility], [place] [address] [place], [place] [address] ?  ? Report Status PENDING  Incomplete  ? ? ?Impression/Plan:  ?1. Strep pneumonia bacteremia associated with septic arthritis - initially admitted with pneumonia and noted to have multiple  joints involved and concern for septic arthritis.  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 Roxan Hockey on 4/17 and found the joint was completely destroyed with the head of the clavicle free of any attachments, necrotic debris that extended into the neck and down beneath the pectoralis.   ?At this point, with multiple joint complaints and difficulty with VAC and other considerations, will change him to ceftriaxone for ease of dosing with once a day administration.   ?Will plan for 6 weeks total.    ? ?2.  Access - picc line in place.   ? ?3.  Polyarticular pain - previous aspiration did not yield anything in his knee and he was reassessed by Dr. Sammuel Hines and does not feel any I and D indicated for any of his joints.   ?Consider NSAIDS such as Celebrex for treatment.   ? ?Diagnosis: ?SCM septic arthritis, bacteremia ? ?Culture Result: Strep pneumococcus ? ?Allergies  ?Allergen Reactions  ? Codeine Itching  ? Glipizide Er [Glipizide]   ?  SOB, malaise. He tolerates XL without issues.  ? Metformin And Related Rash  ? ? ?OPAT Orders ?Discharge antibiotics to be given via PICC line ?Discharge antibiotics: ceftriaxone 2 grams IV every 24 hours ?Per pharmacy protocol yes ?Duration: ?6 weeks ?End Date: ?01/18/22 ? ?[facility] Care Per Protocol: yes ? ?Home health RN for IV administration and teaching; PICC line care and labs.   ? ?Labs weekly while on IV antibiotics: ?_x_ CBC with differential ?__ BMP ?__x CMP ?__x CRP ?__x ESR ?__ Vancomycin trough ?__ CK ? ?_x_ Please pull PIC at completion of IV antibiotics ?__ Please leave PIC in place until doctor has seen patient or been notified ? ?Fax weekly labs to [phone] ? ?Clinic Follow Up Appt: ?12/26/21 ? ?@ 11:15 with Dr. Candiss Norse ? ? ?  ?

## 2021-12-11 NOTE — Progress Notes (Signed)
Physical Therapy Treatment ?Patient Details ?Name: David Strong ?MRN: [id number] ?DOB: [birth year] ?Today's Date: 12/11/2021 ? ? ?History of Present Illness Pt. is a 66 y.o. M presenting to [facility] 4/5 Admitted for severe sepsis, POA due to streptococcal pneumonia with bacteremia, left sternoclavicular joint septic arthritis and other large joint arthralgia/arthritis (right shoulder, left knee). Pt underwent I&D of [place] joint with wound vac placement on 4/17. PMH significant for T2DM.  CTA showed no PE, confirmed multifocal pneumonia and MRI shows left sternoclavicular joint inflammatory changes with scattered foci of air within the soft tissues and sternoclavicular joint space compatible with localized left sternoclavicular septic arthritis/cellulitis, without evidence of drainable fluid collection or large joint effusion.  Subsequent BCID and urine antigen positive for strep.  Antibiotics narrowed to IV ceftriaxone.  IR ultrasound shows +RUE superficial thrombosis and L LE DVT. ? ?  ?PT Comments  ? ? Patient progressing slowly towards PT goals. Requires max encouragement and coaxing to participate in therapy session. Session focused on transfers and progressive ambulation. Able to perform gait training with Min guard-Min A and use of RW for support. Continues to have swelling in Bil feet, left knee, hands etc impacting mobility. Encouraged increasing activity level while in the hospital to help with overall strength, mobility and swelling. Will follow. ?   ?Recommendations for follow up therapy are one component of a multi-disciplinary discharge planning process, led by the attending physician.  Recommendations may be updated based on patient status, additional functional criteria and insurance authorization. ? ?Follow Up Recommendations ? Home health PT ?  ?  ?Assistance Recommended at Discharge Intermittent Supervision/Assistance  ?Patient can return home with the following A little help with walking and/or transfers;A  little help with bathing/dressing/bathroom;Assistance with cooking/housework;Assist for transportation;Help with stairs or ramp for entrance ?  ?Equipment Recommendations ? Rolling walker (2 wheels)  ?  ?Recommendations for Other Services   ? ? ?  ?Precautions / Restrictions Precautions ?Precautions: Fall;Other (comment) ?Precaution Comments: wound vac ?Restrictions ?Weight Bearing Restrictions: No ?Other Position/Activity Restrictions: 4/21 for CT surgery PA no WB restrictions for UE  ?  ? ?Mobility ? Bed Mobility ?Overal bed mobility: Needs Assistance ?Bed Mobility: Supine to Sit ?  ?  ?Supine to sit: Supervision, HOB elevated ?  ?  ?General bed mobility comments: Assist with lines. ?  ? ?Transfers ?Overall transfer level: Needs assistance ?Equipment used: Rolling walker (2 wheels) ?Transfers: Sit to/from Stand ?Sit to Stand: Mod assist, Min guard ?  ?  ?  ?  ?  ?General transfer comment: min guard for safety to stand from EOB x1 reaching for chair for support, mod A to stand from low toilet due to weakness, transferred to chair post ambulation. ?  ? ?Ambulation/Gait ?Ambulation/Gait assistance: Min assist ?Gait Distance (Feet): 75 Feet ?Assistive device: Rolling walker (2 wheels) ?Gait Pattern/deviations: Step-through pattern, Decreased stride length, Trunk flexed ?Gait velocity: decreased ?  ?  ?General Gait Details: Slow, mildly unsteady gait with a few standing rest breaks due to pain/fatigue and weakness. VSS on RA. ? ? ?Stairs ?  ?  ?  ?  ?  ? ? ?Wheelchair Mobility ?  ? ?Modified Rankin (Stroke Patients Only) ?  ? ? ?  ?Balance Overall balance assessment: Needs assistance ?Sitting-balance support: Feet supported, No upper extremity supported ?Sitting balance-Leahy Scale: Good ?  ?  ?Standing balance support: During functional activity ?Standing balance-Leahy Scale: Fair ?Standing balance comment: fair static standing, unsteadiness with dynamic tasks without AD, does better with UE support ?  ?  ?  ?  ?  ?   ?  ?  ?  ?  ?  ?  ? ?  ?  Cognition Arousal/Alertness: Awake/alert ?Behavior During Therapy: [facility] for tasks assessed/performed ?Overall Cognitive Status: No family/caregiver present to determine baseline cognitive functioning ?  ?  ?  ?  ?  ?  ?  ?  ?  ?  ?  ?  ?  ?  ?  ?  ?General Comments: Requires lots of education about medical condition/situation and reasons for importance of movement etc. Poor understanding of how movement can be beneficial. "I walked at [facility], I have no trouble walking," despite this being over 2 weeks ago. Pt appropriately frustrated. ?  ?  ? ?  ?Exercises   ? ?  ?General Comments General comments (skin integrity, edema, etc.): Wife present during session. ?  ?  ? ?Pertinent Vitals/Pain Pain Assessment ?Pain Assessment: Faces ?Faces Pain Scale: Hurts even more ?Pain Location: "all joints" ?Pain Descriptors / Indicators: Sore, Aching ?Pain Intervention(s): Monitored during session, Repositioned  ? ? ?Home Living   ?  ?  ?  ?  ?  ?  ?  ?  ?  ?   ?  ?Prior Function    ?  ?  ?   ? ?PT Goals (current goals can now be found in the care plan section) Acute Rehab PT Goals ?Patient Stated Goal: less pain, decrease swelling, go home ?PT Goal Formulation: With patient ?Time For Goal Achievement: 12/25/21 ?Potential to Achieve Goals: Fair ?Progress towards PT goals: Progressing toward goals ? ?  ?Frequency ? ? ? Min 3X/week ? ? ? ?  ?PT Plan Current plan remains appropriate  ? ? ?Co-evaluation   ?  ?  ?  ?  ? ?  ?AM-PAC PT "6 Clicks" Mobility   ?Outcome Measure ? Help needed turning from your back to your side while in a flat bed without using bedrails?: A Little ?Help needed moving from lying on your back to sitting on the side of a flat bed without using bedrails?: A Little ?Help needed moving to and from a bed to a chair (including a wheelchair)?: A Little ?Help needed standing up from a chair using your arms (e.g., wheelchair or bedside chair)?: A Lot ?Help needed to walk in hospital room?: A  Little ?Help needed climbing 3-5 steps with a railing? : A Lot ?6 Click Score: 16 ? ?  ?End of Session Equipment Utilized During Treatment: Gait belt ?Activity Tolerance: Patient tolerated treatment well ?Patient left: in chair;with call bell/phone within reach;with family/visitor present ?Nurse Communication: Mobility status ?PT Visit Diagnosis: Difficulty in walking, not elsewhere classified (R26.2);Pain ?Pain - part of body:  (generalized) ?  ? ? ?Time: 2409-7353 ?PT Time Calculation (min) (ACUTE ONLY): 38 min ? ?Charges:  $Gait Training: 8-22 mins ?$Therapeutic Activity: 23-37 mins          ?          ? ?Vale Haven, PT, DPT ?Acute Rehabilitation Services ?Secure chat preferred ?Office [phone] ? ? ? ? ? ?Blake Divine A Azariyah Luhrs ?12/11/2021, 3:10 PM ? ?

## 2021-12-11 NOTE — Progress Notes (Signed)
PHARMACY CONSULT NOTE FOR: ? ?OUTPATIENT  PARENTERAL ANTIBIOTIC THERAPY (OPAT) ? ?Indication: Strep pneumo bacteremia/septic arthritis ?Regimen: Change to Ceftriaxone 2 g every 24 hours for ease of administration per Dr. Linus Salmons. ?End date: 01/18/22 (adjusted from original plan since pt went back to OR on 12/07/21). ? ?IV antibiotic discharge orders are pended. ?To discharging provider:  please sign these orders via discharge navigator,  ?Select New Orders & click on the button choice - Manage This Unsigned Work.  ?  ?Thank you for allowing pharmacy to participate in this patient's care. ? ?Reatha Harps, PharmD ?PGY1 Pharmacy Resident ?12/11/2021 12:38 PM ?Check [email or web address] for unit specific pharmacy number ? ? ? ?

## 2021-12-11 NOTE — Progress Notes (Signed)
3 Days Post-Op Procedure(s) (LRB): ?TRANSESOPHAGEAL ECHOCARDIOGRAM (TEE) (N/A) ?Subjective: ?Continues to complain of leg and foot pain ? ?Objective: ?Vital signs in last 24 hours: ?Temp:  [97.7 ?F (36.5 ?C)-98.5 ?F (36.9 ?C)] 98.3 ?F (36.8 ?C) (04/24 1632) ?Pulse Rate:  [83-100] 88 (04/24 1632) ?Cardiac Rhythm: Normal sinus rhythm (04/24 0750) ?Resp:  [14-20] 16 (04/24 1632) ?BP: (107-156)/(61-98) 107/61 (04/24 1632) ?SpO2:  [94 %-99 %] 94 % (04/24 1632) ?Weight:  [79.8 kg] 79.8 kg (04/24 0650) ? ?Hemodynamic parameters for last 24 hours: ?  ? ?Intake/Output from previous day: ?04/23 0701 - 04/24 0700 ?In: 1238.4 [P.O.:480; I.V.:758.4] ?Out: 4075 [Urine:3950; Drains:125] ?Intake/Output this shift: ?Total I/O ?In: -  ?Out: 225 [Urine:225] ? ?General appearance: alert, cooperative, and no distress ?Neurologic: intact ?Wound: granulating > 90% of surface area ? ?Lab Results: ?Recent Labs  ?  12/10/21 ?0510 12/11/21 ?5573  ?WBC 4.4 4.6  ?HGB 9.0* 8.7*  ?HCT 28.2* 26.6*  ?PLT 279 260  ? ?BMET:  ?Recent Labs  ?  12/10/21 ?0510 12/11/21 ?2202  ?NA 135 135  ?K 3.6 3.9  ?CL 101 99  ?CO2 29 30  ?GLUCOSE 101* 137*  ?BUN <5* 5*  ?CREATININE 0.57* 0.76  ?CALCIUM 8.1* 8.2*  ?  ?PT/INR: No results for input(s): LABPROT, INR in the last 72 hours. ?ABG ?No results found for: PHART, HCO3, TCO2, ACIDBASEDEF, O2SAT ?CBG (last 3)  ?Recent Labs  ?  12/11/21 ?0645 12/11/21 ?1108 12/11/21 ?1634  ?GLUCAP 162* 102* 195*  ? ? ?Assessment/Plan: ?S/P Procedure(s) (LRB): ?TRANSESOPHAGEAL ECHOCARDIOGRAM (TEE) (N/A) ?VAC changed at bedside. He a significant amount of pain despite talking pain meds earlier. He was given IV fentanyl during the procedure. ?The wound is slightly smaller at 8 x 3 x 3 cm and almost the entire surface is granulating.  No additional debridement necessary at this time. ?There are 2 pieces of sponge in the wound with a smaller piece in the joint space and a larger piece superficially. ?Change sponges again on Thursday ? ?  LOS: 19 days  ? ? ?[name] ?12/11/2021 ? ? ?

## 2021-12-11 NOTE — Progress Notes (Signed)
ANTICOAGULATION CONSULT NOTE - Follow Up Consult ? ?Pharmacy Consult for Heparin ?Indication: DVT ? ?Allergies  ?Allergen Reactions  ? Codeine Itching  ? Glipizide Er [Glipizide]   ?  SOB, malaise. He tolerates XL without issues.  ? Metformin And Related Rash  ? ? ?Patient Measurements: ?Height: 5\' 4"  (162.6 cm) ?Weight: 79.8 kg (175 lb 14.8 oz) ?IBW/kg (Calculated) : 59.2 ?Heparin Dosing Weight:  77 kg ? ?Vital Signs: ?Temp: 97.7 ?F (36.5 ?C) (04/24 0759) ?Temp Source: Oral (04/24 0759) ?BP: 135/82 (04/24 0759) ?Pulse Rate: 94 (04/24 0759) ? ?Labs: ?Recent Labs  ?  12/09/21 ?0400 12/10/21 ?0510 12/10/21 ?1515 12/11/21 ?12/13/21  ?HGB 9.0* 9.0*  --  8.7*  ?HCT 27.3* 28.2*  --  26.6*  ?PLT 261 279  --  260  ?HEPARINUNFRC 0.52 0.66 0.41 0.36  ?CREATININE 0.62 0.57*  --  0.76  ? ? ? ?Estimated Creatinine Clearance: 87.8 mL/min (by C-G formula based on SCr of 0.76 mg/dL). ? ? ?Medical History: ?Past Medical History:  ?Diagnosis Date  ? Diabetes mellitus without complication (HCC)   ? ? ?Assessment: ?66 yo male with D-dimer 5.37.  CT chest negative for PE. Found to have new L DVT. Thrombocytopenia appears chronic. ? ?Patient was changed from heparin drip to apixaban 4/10.  Apixaban held 4/16 for debridement 4/17. Heparin ordered as bridge while apixaban on hold. ?S/p repeat debridement and VAC change 4/20. ? ?Heparin level therapeutic at 0.36 on 1550 units/hr. Hgb low stable. Plt wnl. No bleeding noted. ? ?Goal of Therapy:  ?Heparin level 0.3-0.5 units/ml ?Monitor platelets by anticoagulation protocol: Yes ?  ?Plan:  ?Continue heparin infusion at 1550 units/hr ?Monitor daily heparin level, CBC and s/s of bleeding  ?F/U ability to transition back to oral agent ? ?Thank you for involving pharmacy in this patient's care. ? ?5/20, PharmD, BCPS ?Clinical Pharmacist ?Clinical phone for 12/11/2021 until 3p is [phone] ?12/11/2021 10:00 AM ? ?**Pharmacist phone directory can be found on [email or web address] listed under [facility]** ? ?

## 2021-12-12 ENCOUNTER — Encounter (HOSPITAL_COMMUNITY): Payer: Self-pay | Admitting: Internal Medicine

## 2021-12-12 ENCOUNTER — Telehealth: Payer: Self-pay | Admitting: Emergency Medicine

## 2021-12-12 ENCOUNTER — Inpatient Hospital Stay: Payer: Medicare Other | Admitting: Internal Medicine

## 2021-12-12 DIAGNOSIS — A419 Sepsis, unspecified organism: Secondary | ICD-10-CM | POA: Diagnosis not present

## 2021-12-12 DIAGNOSIS — R600 Localized edema: Secondary | ICD-10-CM | POA: Diagnosis not present

## 2021-12-12 DIAGNOSIS — J189 Pneumonia, unspecified organism: Secondary | ICD-10-CM | POA: Diagnosis not present

## 2021-12-12 DIAGNOSIS — L0390 Cellulitis, unspecified: Secondary | ICD-10-CM | POA: Diagnosis not present

## 2021-12-12 LAB — CBC WITH DIFFERENTIAL/PLATELET
Abs Immature Granulocytes: 0.03 10*3/uL (ref 0.00–0.07)
Basophils Absolute: 0 10*3/uL (ref 0.0–0.1)
Basophils Relative: 0 %
Eosinophils Absolute: 0.1 10*3/uL (ref 0.0–0.5)
Eosinophils Relative: 2 %
HCT: 26.9 % — ABNORMAL LOW (ref 39.0–52.0)
Hemoglobin: 8.6 g/dL — ABNORMAL LOW (ref 13.0–17.0)
Immature Granulocytes: 1 %
Lymphocytes Relative: 33 %
Lymphs Abs: 1.4 10*3/uL (ref 0.7–4.0)
MCH: 29.4 pg (ref 26.0–34.0)
MCHC: 32 g/dL (ref 30.0–36.0)
MCV: 91.8 fL (ref 80.0–100.0)
Monocytes Absolute: 0.5 10*3/uL (ref 0.1–1.0)
Monocytes Relative: 11 %
Neutro Abs: 2.3 10*3/uL (ref 1.7–7.7)
Neutrophils Relative %: 53 %
Platelets: 279 10*3/uL (ref 150–400)
RBC: 2.93 MIL/uL — ABNORMAL LOW (ref 4.22–5.81)
RDW: 13.3 % (ref 11.5–15.5)
WBC: 4.3 10*3/uL (ref 4.0–10.5)
nRBC: 0 % (ref 0.0–0.2)

## 2021-12-12 LAB — BASIC METABOLIC PANEL
Anion gap: 7 (ref 5–15)
BUN: 7 mg/dL — ABNORMAL LOW (ref 8–23)
CO2: 30 mmol/L (ref 22–32)
Calcium: 8.1 mg/dL — ABNORMAL LOW (ref 8.9–10.3)
Chloride: 99 mmol/L (ref 98–111)
Creatinine, Ser: 0.62 mg/dL (ref 0.61–1.24)
GFR, Estimated: >60 mL/min (ref >60)
Glucose, Bld: 200 mg/dL — ABNORMAL HIGH (ref 70–99)
Potassium: 3.6 mmol/L (ref 3.5–5.1)
Sodium: 136 mmol/L (ref 135–145)

## 2021-12-12 LAB — GLUCOSE, CAPILLARY
Glucose-Capillary: 189 mg/dL — ABNORMAL HIGH (ref 70–99)
Glucose-Capillary: 67 mg/dL — ABNORMAL LOW (ref 70–99)
Glucose-Capillary: 114 mg/dL — ABNORMAL HIGH (ref 70–99)

## 2021-12-12 LAB — AEROBIC/ANAEROBIC CULTURE W GRAM STAIN (SURGICAL/DEEP WOUND)
Culture: NO GROWTH
Gram Stain: NONE SEEN

## 2021-12-12 LAB — PHOSPHORUS: Phosphorus: 3.8 mg/dL (ref 2.5–4.6)

## 2021-12-12 LAB — C-REACTIVE PROTEIN: CRP: 15.1 mg/dL — ABNORMAL HIGH (ref <1.0)

## 2021-12-12 LAB — MAGNESIUM: Magnesium: 1.8 mg/dL (ref 1.7–2.4)

## 2021-12-12 LAB — HEPARIN LEVEL (UNFRACTIONATED)
Heparin Unfractionated: 0.12 IU/mL — ABNORMAL LOW (ref 0.30–0.70)
Heparin Unfractionated: 0.32 IU/mL (ref 0.30–0.70)

## 2021-12-12 MED ORDER — FUROSEMIDE 20 MG PO TABS
20.0000 mg | ORAL_TABLET | Freq: Every day | ORAL | Status: DC
  Administered 2021-12-12 – 2021-12-13 (×2): 20 mg via ORAL
  Filled 2021-12-12 (×2): qty 1

## 2021-12-12 MED ORDER — APIXABAN 5 MG PO TABS
5.0000 mg | ORAL_TABLET | Freq: Two times a day (BID) | ORAL | Status: DC
  Administered 2021-12-12 – 2021-12-18 (×13): 5 mg via ORAL
  Filled 2021-12-12 (×14): qty 1

## 2021-12-12 NOTE — Progress Notes (Signed)
? ? ? [facility] ?                                                                            ? ? ?David Strong, is a 66 y.o. male, DOB - 10/05/1955, ZDG:[id number] ?Admit date - 11/22/2021    ?Outpatient Primary MD for the patient is [name], MD ? ?LOS - 20  days ? ? ? ?Brief summary  ? ?66 year old male with medical history of diabetes mellitus type 2 presented to the ED with complaints of productive cough, subjective fever, chills, myalgia, severe arthralgia, new onset erythema and swelling of the left side of the base of the neck.  CTA showed no PE, confirmed right middle lobe pneumonia and bibasilar airspace opacities consistent with multifocal pneumonia and left sternoclavicular joint inflammatory changes with scattered foci of air within the soft tissue and sternoclavicular joint space compatible with left localized sternoclavicular septic arthritis/cellulitis, without evidence of drainable fluid collection or large joint effusion.  Subsequent BC ID and urine antigen positive for strep.  Antibiotics were narrowed down to ceftriaxone.  Newly diagnosed right upper arm SVT and left lower extremity DVT, briefly on IV heparin transition to Eliquis . ?MRI right shoulder findings suspicious for septic arthritis involving AC joint and glenohumeral joint.  Severe myositis and pyomyositis involving the subscapularis muscle, the anterior deltoid muscle and distal aspect of pectoralis muscle.  There is also myositis involving the infraspinatus muscle.   ?  ?Patient was seen by orthopedic surgery, no fluid could be aspirated on joint aspiration from the right shoulder.  He was seen by infectious disease and TEE was recommended, however patient did not want to get inpatient TEE so outpatient TEE was scheduled.  ID recommended to discharge home on 6 weeks of penicillin.  Home health RN/PT was arranged for IV antibiotics.  Eliquis also was sent to the pharmacy. ?  ?Before discharge patient complained of  worsening swelling of right chest wall, crepitus was palpated with likely subluxation of left clavicle at the sternoclavicular joint.  CT chest with contrast was obtained which showed progressive anterior subluxation of the left sternoclavicular joint with new erosive changes with bony fragmentation along the medial articular surface of the proximal left clavicle.  There was progressive fluid and less collection centered on the left supraclavicular joint which extended to the left side of the neck.  Findings consistent with abscess. ?  ?CVTS was consulted, Dr. Roxan Hockey saw the patient and recommended transfer to [facility] for incision and drainage. ?Pt underwent s/p I&D on 12/05/21 and on 12/07/21. TEE was done and is negative for vegetations,  left knee started swelling and he underw MRI of the knee done, showed evidence of fluid within the left knee as well as several multifocal abscesses involving the posterior soft tissues of the left knee. Orthopedics re consulted( Dr Sammuel Hines) and recommendations given, no plans for any surgical intervention at this time due to multiple joint involvement and time period of involvement.  ? ?He is due for dressing change this Thursday and his main complaints are persistent swelling of the legs.  ? ?  ?  ? ? ?Assessment & Plan  ? ? ?Assessment and Plan: ?*  Severe sepsis ([place]) ?Due to streptococcal multifocal pneumonia with bacteremia, presumed left sternoclavicular joint septic arthritis left supraclavicular abscess and other polyarthralgia/arthritis (right shoulder, left knee) ?Met sepsis criteria on admission including persistent tachypnea, tachycardia and did spike a fever of 100.6 overnight of admission.  CTA chest confirmed multifocal pneumonia.   ?Empirically started on IV vancomycin, ceftriaxone and azithromycin. ?BCID and urine positive for Streptococcus.  ID consulted and his antibiotics narrowed to penicillin G on 4/10 plan for 6 weeks of antibiotics till  01/03/2022 ?TTE: LVEF 60-65%.  No evidence of valvular vegetations.  Surveillance blood cultures 4/7: NTD ?TEE is negative for endocarditis.  Outpatient follow up with ID.  ?Sepsis physiology has resolved but still with significant arthralgias/arthritis of above joints.   ?Pt was transferred to [facility] per cardiothoracic surgery request and he underwent irrigation and debridement of the left sternoclavicular joint abscess on 12/04/21 and on 12/07/21. ?Cultures from the debridement are pending.  ? ? ? ?Septic arthritis ([place]) ?Left sternoclavicular joint septic arthritis ?Has other polyarthralgias/arthritis (right shoulder and left knee) ?Left ankle swelling and left knee swelling with some cellulitis changes. Joint aspiration was attempted however no fluid obtained.   ?MRI of the  left knee done, showed evidence of fluid within the left knee as well as several multifocal abscesses involving the posterior soft tissues of the left knee. Orthopedics consulted and recommendations given. No surgical intervention at this time. Continue with IV antibiotics.  ?Patient concerned about ankle edema, pt denies any pain in the ankles.Pt has generalized body wall edema probably form hypoalbuminemia.  Prn lasix.  ? ? ? ?DVT (deep venous thrombosis) ([place]) ?Left lower extremity: Age-indeterminate DVT left popliteal vein, acute DVT left gastrocnemius and left posterior tibial veins. ?Currently on IV heparin, transition to eliquis later today.  ?Due to complaint of bilateral leg pain and swelling, obtained RLE venous Dopplers which were negative for DVT or SVT. ?Hematology consulted for further evaluation in view of hypoalbuminemia, DVT.  ?Hematology suggested at least 3 months of anti coagulation and outpatient follow up with hematology for anti coagulopathy work up.  ? Was on IV heparin for the debridement, transitioned to Eliquis.  ? ? ?Transaminitis ?Mild and likely related to severe sepsis. ?No GI symptoms. ?RUQ ultrasound with  gallbladder sludge and no acute findings. ? ? ?High anion gap metabolic acidosis ?Lactic acidosis ?Secondary to severe sepsis. ?Bicarbonate normal.  Anion gap has normalized. ?Resolved.  ? ?Thrombocytopenia ([place]) ?Suspect chronic or acute on chronic, probably from sepsis.  ?Platelet counts in 2018: 104 ?Resolved. ? ?Type 2 diabetes mellitus ([place]) ?A1c 8 on 10/12/2021. ?On glipizide and dapagliflozin at home.  Currently held. ?CBGs uncontrolled and fluctuating.   ? ?Recent Labs  ?  12/11/21 ?1634 12/11/21 ?2119 12/12/21 ?3785  ?GLUCAP 195* 159* 189*  ? ? ?Decrease semglee to 35 unts and continue with novolog 8 units tidac and SSI.   ?No changes in meds today.  ? ? ?Hypokalemia/Hypomagnesemia  ?Replaced.   ? ?Hypophosphatemia ?Replaced. ? ?Bilateral leg edema ?Likely due to IV fluids early on in admission and hypoalbuminemic state. ?Also has left lower extremity DVT. Prn lasix.  ? ?Constipation ?Aggressive bowel regimen. ?BMs documented. ? ?Hyponatremia:  ?Resolved.  ?  ? ?Acute anemia of blood loss probably from debridement:  ?Anemia of acute illness.  ?His hemoglobin was normal on admission around 14.5, dropped  to 9 and stable around 8.  ?Continue to monitor.  ? ? ? ?Hypomagnesemia:  ?Replaced.  ? ? ?Right elbow swelling and  tenderness:  ?X rays of the elbow ordered and pending.  ?  ? ?Malnutrition Type: ? ?Nutrition Problem: Increased nutrient needs ?Etiology: acute illness ? ? ?Malnutrition Characteristics: ? ?Signs/Symptoms: estimated needs ? ? ?Nutrition Interventions: ? ?Interventions: MVI, Premier Protein, Other (Comment) (double protein) ? ?Estimated body mass index is 30.08 kg/m? as calculated from the following: ?  Height as of this encounter: 5' 4" (1.626 m). ?  Weight as of this encounter: 79.5 kg. ? ?Code Status: Full code.  ?DVT Prophylaxis:  Place TED hose Start: 12/08/21 1637 ?SCD's Start: 12/04/21 1836 ?SCDs Start: 11/22/21 1920 ?apixaban (ELIQUIS) tablet 5 mg  ? ?Level of Care: Level of care:  Progressive ?Family Communication: None at bedside.  ? ?Disposition Plan:     Remains inpatient appropriate:  dressing change on Thursday, probable discharge by the end of the week.  ? ?Procedures:  ?IRRIGATION AND DEBRIDMENT OF

## 2021-12-12 NOTE — Plan of Care (Signed)

## 2021-12-12 NOTE — Progress Notes (Signed)
Physical Therapy Treatment ?Patient Details ?Name: David Strong ?MRN: [id number] ?DOB: May 19, 1956 ?Today's Date: 12/12/2021 ? ? ?History of Present Illness Pt. is a 66 y.o. M presenting to [facility] 4/5 Admitted for severe sepsis, POA due to streptococcal pneumonia with bacteremia, left sternoclavicular joint septic arthritis and other large joint arthralgia/arthritis (right shoulder, left knee). Pt underwent I&D of [place] joint with wound vac placement on 4/17. PMH significant for T2DM.  CTA showed no PE, confirmed multifocal pneumonia and MRI shows left sternoclavicular joint inflammatory changes with scattered foci of air within the soft tissues and sternoclavicular joint space compatible with localized left sternoclavicular septic arthritis/cellulitis, without evidence of drainable fluid collection or large joint effusion.  Subsequent BCID and urine antigen positive for strep.  Antibiotics narrowed to IV ceftriaxone.  IR ultrasound shows +RUE superficial thrombosis and L LE DVT. ? ?  ?PT Comments  ? ? Pt progressing well towards his physical therapy goals; agreeable to participate with spouse present today. Reviewed RUE ROM exercises. Pt ambulating 200 ft with a walker at a supervision level. Encouraged continued activity.  ?   ?Recommendations for follow up therapy are one component of a multi-disciplinary discharge planning process, led by the attending physician.  Recommendations may be updated based on patient status, additional functional criteria and insurance authorization. ? ?Follow Up Recommendations ? Home health PT ?  ?  ?Assistance Recommended at Discharge Intermittent Supervision/Assistance  ?Patient can return home with the following A little help with walking and/or transfers;A little help with bathing/dressing/bathroom;Assistance with cooking/housework;Assist for transportation;Help with stairs or ramp for entrance ?  ?Equipment Recommendations ? Rolling walker (2 wheels)  ?  ?Recommendations for Other  Services   ? ? ?  ?Precautions / Restrictions Precautions ?Precautions: Fall;Other (comment) ?Precaution Comments: wound vac ?Restrictions ?Weight Bearing Restrictions: No  ?  ? ?Mobility ? Bed Mobility ?  ?  ?  ?  ?  ?  ?  ?General bed mobility comments: Seated EOB ?  ? ?Transfers ?Overall transfer level: Needs assistance ?Equipment used: Rolling walker (2 wheels) ?Transfers: Sit to/from Stand ?Sit to Stand: Supervision ?  ?  ?  ?  ?  ?  ?  ? ?Ambulation/Gait ?Ambulation/Gait assistance: Supervision ?Gait Distance (Feet): 200 Feet ?Assistive device: Rolling walker (2 wheels) ?Gait Pattern/deviations: Step-through pattern, Decreased stride length, Trunk flexed ?Gait velocity: decreased ?  ?  ?General Gait Details: Slow, overall fairly steady gait. Decreased bilateral foot clearance. Supervision for safety ? ? ?Stairs ?  ?  ?  ?  ?  ? ? ?Wheelchair Mobility ?  ? ?Modified Rankin (Stroke Patients Only) ?  ? ? ?  ?Balance Overall balance assessment: Needs assistance ?Sitting-balance support: Feet supported, No upper extremity supported ?Sitting balance-Leahy Scale: Good ?  ?  ?Standing balance support: During functional activity ?Standing balance-Leahy Scale: Fair ?  ?  ?  ?  ?  ?  ?  ?  ?  ?  ?  ?  ?  ? ?  ?Cognition Arousal/Alertness: Awake/alert ?Behavior During Therapy: [name] for tasks assessed/performed ?Overall Cognitive Status: No family/caregiver present to determine baseline cognitive functioning ?  ?  ?  ?  ?  ?  ?  ?  ?  ?  ?  ?  ?  ?  ?  ?  ?General Comments: STM deficits ?  ?  ? ?  ?Exercises General Exercises - Upper Extremity ?Shoulder Flexion: AAROM, Right, 10 reps, Seated ?Elbow Flexion: AROM, Right, 10 reps, Seated ?Wrist Flexion: AROM,  Right, 10 reps, Seated ?Wrist Extension: AROM, Right, 10 reps, Seated ?Digit Composite Flexion: AROM, Right, 10 reps, Seated ?Composite Extension: AROM, Right, 10 reps, Seated ?Hand Exercises ?Forearm Supination: AROM, Right, 10 reps, Seated ?Forearm Pronation: AROM,  Right, 10 reps, Seated ?Opposition: AROM, Right, 10 reps, Seated ? ?  ?General Comments   ?  ?  ? ?Pertinent Vitals/Pain Pain Assessment ?Pain Assessment: Faces ?Faces Pain Scale: Hurts little more ?Pain Location: "all joints" ?Pain Descriptors / Indicators: Sore, Aching ?Pain Intervention(s): Monitored during session  ? ? ?Home Living   ?  ?  ?  ?  ?  ?  ?  ?  ?  ?   ?  ?Prior Function    ?  ?  ?   ? ?PT Goals (current goals can now be found in the care plan section) Acute Rehab PT Goals ?Patient Stated Goal: no more infection ?Potential to Achieve Goals: Good ?Progress towards PT goals: Progressing toward goals ? ?  ?Frequency ? ? ? Min 3X/week ? ? ? ?  ?PT Plan Current plan remains appropriate  ? ? ?Co-evaluation   ?  ?  ?  ?  ? ?  ?AM-PAC PT "6 Clicks" Mobility   ?Outcome Measure ? Help needed turning from your back to your side while in a flat bed without using bedrails?: A Little ?Help needed moving from lying on your back to sitting on the side of a flat bed without using bedrails?: A Little ?Help needed moving to and from a bed to a chair (including a wheelchair)?: A Little ?Help needed standing up from a chair using your arms (e.g., wheelchair or bedside chair)?: A Little ?Help needed to walk in hospital room?: A Little ?Help needed climbing 3-5 steps with a railing? : A Lot ?6 Click Score: 17 ? ?  ?End of Session   ?Activity Tolerance: Patient tolerated treatment well ?Patient left: in bed;with call bell/phone within reach ?Nurse Communication: Mobility status ?PT Visit Diagnosis: Difficulty in walking, not elsewhere classified (R26.2);Pain ?  ? ? ?Time: [id number] ?PT Time Calculation (min) (ACUTE ONLY): 33 min ? ?Charges:  $Therapeutic Exercise: 8-22 mins ?$Therapeutic Activity: 8-22 mins          ?          ? ?[name], PT, DPT ?Acute Rehabilitation Services ?Pager [phone] ?Office [phone] ? ? ? ?[name] ?12/12/2021, 4:53 PM ? ?

## 2021-12-12 NOTE — Progress Notes (Signed)
Hypoglycemic Event ? ?CBG: 67 ? ?Treatment: 4 oz juice/soda ? ?Symptoms: Hungry ? ?Follow-up CBG: Time: 2158 CBG Result: 114 ? ?Possible Reasons for Event: Other: late eating dinner ? ?Comments/MD notified: [name], [name] ? ? ? ?David Strong ? ? ?

## 2021-12-12 NOTE — Telephone Encounter (Signed)
Pt requesting a call back to discuss "several things" including medication and work excuse note for spouse ?

## 2021-12-12 NOTE — Progress Notes (Signed)
ANTICOAGULATION CONSULT NOTE - Initial Consult ? ?Pharmacy Consult for transition from heparin back to apixaban ?Indication: DVT ? ?Allergies  ?Allergen Reactions  ? Codeine Itching  ? Glipizide Er [Glipizide]   ?  SOB, malaise. He tolerates XL without issues.  ? Metformin And Related Rash  ? ? ?Patient Measurements: ?Height: 5\' 4"  (162.6 cm) ?Weight: 79.5 kg (175 lb 4.3 oz) ?IBW/kg (Calculated) : 59.2 ? ?Vital Signs: ?Temp: 98 ?F (36.7 ?C) (04/25 08-05-1990) ?Temp Source: Oral (04/25 08-05-1990) ?BP: 133/95 (04/25 08-05-1990) ?Pulse Rate: 89 (04/25 0712) ? ?Labs: ?Recent Labs  ?  12/10/21 ?0510 12/10/21 ?1515 12/11/21 ?12/13/21 12/12/21 ?12/14/21 12/12/21 ?12/14/21  ?HGB 9.0*  --  8.7* 8.6*  --   ?HCT 28.2*  --  26.6* 26.9*  --   ?PLT 279  --  260 279  --   ?HEPARINUNFRC 0.66   < > 0.36 0.12* 0.32  ?CREATININE 0.57*  --  0.76  --   --   ? < > = values in this interval not displayed.  ? ? ?Estimated Creatinine Clearance: 87.6 mL/min (by C-G formula based on SCr of 0.76 mg/dL). ? ? ?Medical History: ?Past Medical History:  ?Diagnosis Date  ? Diabetes mellitus without complication (HCC)   ? ? ?Medications:  ?Medications Prior to Admission  ?Medication Sig Dispense Refill Last Dose  ? Camphor-Eucalyptus-Menthol (VICKS VAPORUB EX) Apply 1 application. topically daily as needed (chest congestion).   11/21/2021  ? glipiZIDE (GLIPIZIDE XL) 10 MG 24 hr tablet Take 2 tablets (20 mg total) by mouth 2 (two) times daily with a meal. (Patient taking differently: Take 10 mg by mouth 2 (two) times daily with a meal.) 360 tablet 1 11/21/2021  ? ibuprofen (ADVIL) 200 MG tablet Take 600 mg by mouth every 6 (six) hours as needed.   11/22/2021  ? Phenol (EQ SORE THROAT SPRAY MT) Use as directed 1 spray in the mouth or throat daily as needed (sore throat).   11/22/2021  ? Phenyleph-Doxylamine-DM-APAP (TYLENOL COLD MULTI-SYMPTOM PO) Take 2 tablets by mouth every 6 (six) hours as needed (cold symptoms).   11/21/2021  ? sildenafil (VIAGRA) 100 MG tablet Take 0.5-1 tablets  (50-100 mg total) by mouth daily as needed for erectile dysfunction. 5 tablet 11 unk  ? dapagliflozin propanediol (FARXIGA) 10 MG TABS tablet Take 1 tablet (10 mg total) by mouth daily before breakfast. (Patient not taking: Reported on 11/22/2021) 90 tablet 3 Not Taking  ? glucose blood (ACCU-CHEK AVIVA PLUS) test strip Use to test blood glucose daily. 100 each 3   ? ? ?Assessment: ?66 yo male with D-dimer 5.37.  CT chest negative for PE. Found to have new L DVT. Thrombocytopenia appears chronic. ?  ?Patient was changed from heparin drip to apixaban 4/10. Then 4/16, apixaban held for debridement on 4/17. Heparin ordered while apixaban on hold. S/p repeat debridement and VAC change 4/20. On 4/25, pharmacy consulted to transition back to apixaban. CBC stable, no bleeding noted. ? ?Goal of Therapy:  ?Therapeutic anticoagulation ?Monitor platelets by anticoagulation protocol: Yes ?  ?Plan:  ?Stop heparin ?Start apixaban 5 mg PO BID ?Monitor for bleeding ? ? ?Thank you for allowing 5/25 to participate in this patients care. ?[place], PharmD ?12/12/2021 9:19 AM ? ?**Pharmacist phone directory can be found on [email or web address] listed under [facility]** ? ? ?

## 2021-12-12 NOTE — Progress Notes (Signed)
4 Days Post-Op Procedure(s) (LRB): ?TRANSESOPHAGEAL ECHOCARDIOGRAM (TEE) (N/A) ?Subjective: ?Still c/o joint pain in legs, no pain at [facility] site this AM ? ?Objective: ?Vital signs in last 24 hours: ?Temp:  [97.7 ?F (36.5 ?C)-98.3 ?F (36.8 ?C)] 98 ?F (36.7 ?C) (04/25 9381) ?Pulse Rate:  [83-100] 89 (04/25 0712) ?Cardiac Rhythm: Normal sinus rhythm (04/24 2025) ?Resp:  [12-20] 12 (04/25 8299) ?BP: (107-146)/(61-98) 133/95 (04/25 3716) ?SpO2:  [93 %-99 %] 96 % (04/25 0712) ?Weight:  [79.5 kg] 79.5 kg (04/25 0433) ? ?Hemodynamic parameters for last 24 hours: ?  ? ?Intake/Output from previous day: ?04/24 0701 - 04/25 0700 ?In: 240 [P.O.:240] ?Out: 1326 [Urine:1325; Stool:1] ?Intake/Output this shift: ?No intake/output data recorded. ? ?General appearance: alert, cooperative, and no distress ?VAC in place ?3+ edema LE ? ?Lab Results: ?Recent Labs  ?  12/11/21 ?9678 12/12/21 ?0432  ?WBC 4.6 4.3  ?HGB 8.7* 8.6*  ?HCT 26.6* 26.9*  ?PLT 260 279  ? ?BMET:  ?Recent Labs  ?  12/10/21 ?0510 12/11/21 ?9381  ?NA 135 135  ?K 3.6 3.9  ?CL 101 99  ?CO2 29 30  ?GLUCOSE 101* 137*  ?BUN <5* 5*  ?CREATININE 0.57* 0.76  ?CALCIUM 8.1* 8.2*  ?  ?PT/INR: No results for input(s): LABPROT, INR in the last 72 hours. ?ABG ?No results found for: PHART, HCO3, TCO2, ACIDBASEDEF, O2SAT ?CBG (last 3)  ?Recent Labs  ?  12/11/21 ?1634 12/11/21 ?2119 12/12/21 ?0175  ?GLUCAP 195* 159* 189*  ? ? ?Assessment/Plan: ?S/P Procedure(s) (LRB): ?TRANSESOPHAGEAL ECHOCARDIOGRAM (TEE) (N/A) ?Keep VAC in place ?Will be due for dressing change Thursday.  ? ? ? LOS: 20 days  ? ? ?[name] ?12/12/2021 ? ? ?

## 2021-12-13 ENCOUNTER — Other Ambulatory Visit (HOSPITAL_COMMUNITY): Payer: Self-pay

## 2021-12-13 DIAGNOSIS — R600 Localized edema: Secondary | ICD-10-CM | POA: Diagnosis not present

## 2021-12-13 DIAGNOSIS — A419 Sepsis, unspecified organism: Secondary | ICD-10-CM | POA: Diagnosis not present

## 2021-12-13 DIAGNOSIS — J189 Pneumonia, unspecified organism: Secondary | ICD-10-CM | POA: Diagnosis not present

## 2021-12-13 DIAGNOSIS — I82402 Acute embolism and thrombosis of unspecified deep veins of left lower extremity: Secondary | ICD-10-CM | POA: Diagnosis not present

## 2021-12-13 LAB — CBC WITH DIFFERENTIAL/PLATELET
Abs Immature Granulocytes: 0.02 10*3/uL (ref 0.00–0.07)
Basophils Absolute: 0 10*3/uL (ref 0.0–0.1)
Basophils Relative: 0 %
Eosinophils Absolute: 0.1 10*3/uL (ref 0.0–0.5)
Eosinophils Relative: 1 %
HCT: 27.5 % — ABNORMAL LOW (ref 39.0–52.0)
Hemoglobin: 8.7 g/dL — ABNORMAL LOW (ref 13.0–17.0)
Immature Granulocytes: 0 %
Lymphocytes Relative: 23 %
Lymphs Abs: 1.2 10*3/uL (ref 0.7–4.0)
MCH: 28.7 pg (ref 26.0–34.0)
MCHC: 31.6 g/dL (ref 30.0–36.0)
MCV: 90.8 fL (ref 80.0–100.0)
Monocytes Absolute: 0.5 10*3/uL (ref 0.1–1.0)
Monocytes Relative: 10 %
Neutro Abs: 3.4 10*3/uL (ref 1.7–7.7)
Neutrophils Relative %: 66 %
Platelets: 304 10*3/uL (ref 150–400)
RBC: 3.03 MIL/uL — ABNORMAL LOW (ref 4.22–5.81)
RDW: 13.4 % (ref 11.5–15.5)
WBC: 5.2 10*3/uL (ref 4.0–10.5)
nRBC: 0 % (ref 0.0–0.2)

## 2021-12-13 LAB — GLUCOSE, CAPILLARY
Glucose-Capillary: 116 mg/dL — ABNORMAL HIGH (ref 70–99)
Glucose-Capillary: 224 mg/dL — ABNORMAL HIGH (ref 70–99)
Glucose-Capillary: 141 mg/dL — ABNORMAL HIGH (ref 70–99)
Glucose-Capillary: 143 mg/dL — ABNORMAL HIGH (ref 70–99)
Glucose-Capillary: 133 mg/dL — ABNORMAL HIGH (ref 70–99)
Glucose-Capillary: 164 mg/dL — ABNORMAL HIGH (ref 70–99)

## 2021-12-13 LAB — PHOSPHORUS: Phosphorus: 2.9 mg/dL (ref 2.5–4.6)

## 2021-12-13 LAB — MAGNESIUM: Magnesium: 1.7 mg/dL (ref 1.7–2.4)

## 2021-12-13 MED ORDER — FUROSEMIDE 10 MG/ML IJ SOLN
40.0000 mg | Freq: Once | INTRAMUSCULAR | Status: AC
  Administered 2021-12-13: 40 mg via INTRAVENOUS
  Filled 2021-12-13: qty 4

## 2021-12-13 NOTE — Progress Notes (Signed)
OT Cancellation Note ? ?Patient Details ?Name: David Strong ?MRN: [id number] ?DOB: 07-20-1956 ? ? ?Cancelled Treatment:    Reason Eval/Treat Not Completed: Other (comment) Currently with lunch tray. Will follow-up for OT session as schedule permits.  ? ?Lorre Munroe ?12/13/2021, 11:30 AM ?

## 2021-12-13 NOTE — Progress Notes (Signed)
Nutrition Follow-up ? ?DOCUMENTATION CODES:  ? ?Not applicable ? ?INTERVENTION:  ? ?- Continue Ensure Max po BID, each supplement provides 150 kcal and 30 grams of protein ?  ?- Continue double protein portions TID with meals ?  ?- Continue MVI with minerals daily ?  ?- Encourage PO intake ? ?NUTRITION DIAGNOSIS:  ? ?Increased nutrient needs related to acute illness as evidenced by estimated needs. ? ?Ongoing, being addressed via oral nutrition supplements ? ?GOAL:  ? ?Patient will meet greater than or equal to 90% of their needs ? ?Progressing ? ?MONITOR:  ? ?PO intake, Supplement acceptance, Labs, Weight trends, Skin, I & O's ? ?REASON FOR ASSESSMENT:  ? ?Malnutrition Screening Tool ?  ? ?ASSESSMENT:  ? ?66 year old male with medical history of type 2 DM. He presented to the ED with several day hx of productive cough, subjective fevers, chills, myalgia, severe arthralgia, and new onset erythema and swelling on the L side of the base of the neck. CTA showed no PE, confirmed RML and bibasilar airspace opacities consistent with multifocal pneumonia and L sternoclavicular joint inflammatory changes with scattered foci of air within the soft tissues and sternoclavicular joint space compatible with localized L sternoclavicular septic arthritis/cellulitis, without evidence of drainable fluid collection or large joint effusion. Admitted for severe sepsis d/t streptococcal pneumonia with bacteremia and L sternoclavicular joint septic arthritis. ? ?04/16 - CT chest concerning for abscess at the supraclavicular joint and septic arthritis at sternoclavicular joint with subluxation of L clavicle ?04/17 - transferred to [facility], s/p I&D of sternoclavicular wound with VAC placement ?04/20 - s/p I&D of sternoclavicular joint abscess and wound VAC change ?04/21 - s/p TEE with no signs of endocarditis ? ?Spoke with pt at bedside. Pt reports good appetite and good PO intake. He states that he is eating 100% of most of his meals. Noted  meal completion documentation is missing since lunch meal on 12/09/21. Pt reports that he is also drinking his oral nutrition supplements. ? ?Pt with no nutrition-related concerns at this time. He reports occasional nausea/queasy feeling that resolves over time. Discussed importance of ongoing adequate PO intake with a focus on protein-rich foods to promote wound healing. Pt expresses understanding. ? ?Admit weight: 68 kg ?Current weight: 79.2 kg ? ?Pt with mild pitting generalized edema. ? ?Meal Completion: 0-100% ? ?Medications reviewed and include: dulcolax, lasix 20 mg daily, SSI, novolog 8 units TID with meals, semglee 35 units daily, MVI with minerals daily, miralax, Ensure Max BID, senna, simethicone, IV abx ? ?Labs reviewed: hemoglobin 8.7 ?CBG's: 67-224 x 24 hours ? ?UOP: 1650 ml x 24 hours ? ?Diet Order:   ?Diet Order   ? ?       ?  Diet Carb Modified Fluid consistency: Thin; Room service appropriate? Yes  Diet effective now       ?  ?  Diet - low sodium heart healthy       ?  ? ?  ?  ? ?  ? ? ?EDUCATION NEEDS:  ? ?Education needs have been addressed ? ?Skin:  Skin Assessment: ?Skin Integrity Issues: ?Wound VAC: chest ? ?Last BM:  12/11/21 medium type 5 ? ?Height:  ? ?Ht Readings from Last 1 Encounters:  ?12/04/21 5\' 4"  (1.626 m)  ? ? ?Weight:  ? ?Wt Readings from Last 1 Encounters:  ?12/13/21 79.2 kg  ? ? ?Ideal Body Weight:  59.1 kg ? ?BMI:  Body mass index is 29.97 kg/m?. ? ?Estimated Nutritional Needs:  ? ?Kcal:  2100-2300 kcal ? ?Protein:  115-130 grams ? ?Fluid:  >/= 2.3 L/day ? ? ? ?Mertie Clause, MS, RD, LDN ?Inpatient Clinical Dietitian ?Please see AMiON for contact information. ? ?

## 2021-12-13 NOTE — Telephone Encounter (Signed)
Called patient in reference to previous message. Patient is currently in the hospital and was concern about getting his medications from his pharmacy after D/C. I informed patient that his spouse could pick up his medication. Patient was also inquiring about a note for his wife to take care of him after D/C. I made patient aware that his spouse will need to get FMLA paperwork from her employer. Patient understood information given  ?

## 2021-12-13 NOTE — Progress Notes (Signed)
5 Days Post-Op Procedure(s) (LRB): ?TRANSESOPHAGEAL ECHOCARDIOGRAM (TEE) (N/A) ?Subjective: ?Feels a little better today, c/o itching at [facility] site ? ?Objective: ?Vital signs in last 24 hours: ?Temp:  [98 ?F (36.7 ?C)-99.4 ?F (37.4 ?C)] 98.6 ?F (37 ?C) (04/26 0719) ?Pulse Rate:  [88-98] 94 (04/26 0719) ?Cardiac Rhythm: Normal sinus rhythm (04/25 2006) ?Resp:  [13-20] 14 (04/26 0719) ?BP: (124-134)/(78-121) 134/94 (04/26 0719) ?SpO2:  [92 %-97 %] 94 % (04/26 0719) ?Weight:  [79.2 kg] 79.2 kg (04/26 0417) ? ?Hemodynamic parameters for last 24 hours: ?  ? ?Intake/Output from previous day: ?04/25 0701 - 04/26 0700 ?In: -  ?Out: 1650 [Urine:1650] ?Intake/Output this shift: ?No intake/output data recorded. ? ?General appearance: alert, cooperative, and no distress ?Wound: VAC in place ? ?Lab Results: ?Recent Labs  ?  12/12/21 ?0432 12/13/21 ?0438  ?WBC 4.3 5.2  ?HGB 8.6* 8.7*  ?HCT 26.9* 27.5*  ?PLT 279 304  ? ?BMET:  ?Recent Labs  ?  12/11/21 ?2505 12/12/21 ?0432  ?NA 135 136  ?K 3.9 3.6  ?CL 99 99  ?CO2 30 30  ?GLUCOSE 137* 200*  ?BUN 5* 7*  ?CREATININE 0.76 0.62  ?CALCIUM 8.2* 8.1*  ?  ?PT/INR: No results for input(s): LABPROT, INR in the last 72 hours. ?ABG ?No results found for: PHART, HCO3, TCO2, ACIDBASEDEF, O2SAT ?CBG (last 3)  ?Recent Labs  ?  12/12/21 ?2115 12/12/21 ?2158 12/13/21 ?0614  ?GLUCAP 67* 114* 141*  ? ? ?Assessment/Plan: ?S/P Procedure(s) (LRB): ?TRANSESOPHAGEAL ECHOCARDIOGRAM (TEE) (N/A) ? ?Wound VAC change tomorrow ? ? LOS: 21 days  ? ? ?[name] ?12/13/2021 ? ? ?

## 2021-12-13 NOTE — Progress Notes (Signed)
Mobility Specialist Criteria Algorithm Info. ? ? ? 12/13/21 1050  ?Mobility  ?Activity Ambulated with assistance in hallway;Transferred to/from [facility];Transferred from bed to chair ?(to chair after ambulation)  ?Range of Motion/Exercises Active;All extremities  ?Level of Assistance Standby assist, set-up cues, supervision of patient - no hands on  ?Assistive Device Front wheel walker  ?LUE Weight Bearing WBAT  ?Distance Ambulated (ft) 80 ft  ?Activity Response Tolerated well  ? ?Patient received in bed eager to participate in mobility. Ambulated in hallway with slow gait. Pain + urination urgency limited distance. Returned to room without complaint or incident. Was left in bed with in recliner with all needs met, call bell in reach.  ? ?12/13/2021 ?12:41 PM ? ?Martinique Kasch Borquez, CMS, BS EXP ?Acute Rehabilitation Services  ?GKBOQ:[phone] ?Office: [phone] ? ?

## 2021-12-13 NOTE — Plan of Care (Signed)

## 2021-12-13 NOTE — Consult Note (Signed)
? ?  [facility] CM Inpatient Consult ? ? ?12/13/2021 ? ?[name] ?Oct 12, 1955 ?[id number] ? ?[facility] Customer service manager [TH[employer]]  Occupational hygienist [ACO] Patient: David Strong ? ?Primary Care Provider:  [name], MD, [place] [facility] is an embedded provider with a Chronic Care Management team and program, and is listed for the transition of care follow up and appointments. ? ?Reviewed for LLOS 20 days/disposition needs ? ?Patient was screened for Embedded practice service needs for chronic care management for post hospital follow up care. PT/OT notes reviewed for recommendations. ? ?Plan: A referral request for Embedded Care Management can be made for identified TOC needs for post hospital follow up.  Will continue to follow. ? ?Please contact for further questions, ? ?Charlesetta Shanks, RN BSN CCM ?[facility] [employer] Liaison ? [phone] business mobile phone ?Toll free office [phone]  ?Fax number: [phone] ?[email or web address] ? ? ? ?

## 2021-12-13 NOTE — Progress Notes (Signed)
Occupational Therapy Treatment ?Patient Details ?Name: David Strong ?MRN: [id number] ?DOB: 12/08/1955 ?Today's Date: 12/13/2021 ? ? ?History of present illness Pt. is a 66 y.o. M presenting [facility] Admitted for severe sepsis, POA due to streptococcal pneumonia with bacteremia, left sternoclavicular joint septic arthritis and other large joint arthralgia/arthritis (right shoulder, left knee). Pt underwent I&D of [place] joint with wound vac placement on 4/17. PMH significant for T2DM.  CTA showed no PE, confirmed multifocal pneumonia and MRI shows left sternoclavicular joint inflammatory changes with scattered foci of air within the soft tissues and sternoclavicular joint space compatible with localized left sternoclavicular septic arthritis/cellulitis, without evidence of drainable fluid collection or large joint effusion.  Subsequent BCID and urine antigen positive for strep.  Antibiotics narrowed to IV ceftriaxone.  IR ultrasound shows +RUE superficial thrombosis and L LE DVT. ?  ?OT comments ? Focus on gentle progression of R shoulder ROM with emphasis on tabletop towel pushes and gravity minimized exercises. Pt with improving extremity edema and reports only mild soreness of R shoulder/ankle. Encouraged continued ROM exercises within pt tolerance to improve dominant UE function. Continue to recommend HHOT at DC with transition to OP OT when appropriate.   ? ?Recommendations for follow up therapy are one component of a multi-disciplinary discharge planning process, led by the attending physician.  Recommendations may be updated based on patient status, additional functional criteria and insurance authorization. ?   ?Follow Up Recommendations ? Home health OT  ?  ?Assistance Recommended at Discharge Intermittent Supervision/Assistance  ?Patient can return home with the following ? A little help with bathing/dressing/bathroom;Assistance with cooking/housework;Help with stairs or ramp for entrance;Assist for  transportation ?  ?Equipment Recommendations ? None recommended by OT  ?  ?Recommendations for Other Services   ? ?  ?Precautions / Restrictions Precautions ?Precautions: Fall;Other (comment) ?Precaution Comments: wound vac ?Restrictions ?Weight Bearing Restrictions: No ?LUE Weight Bearing: Weight bearing as tolerated ?Other Position/Activity Restrictions: 4/21 per CT surgery PA no restrictions for UE  ? ? ?  ? ?Mobility Bed Mobility ?  ?  ?  ?  ?  ?  ?  ?General bed mobility comments: in recliner ?  ? ?Transfers ?Overall transfer level: Needs assistance ?Equipment used: None ?Transfers: Sit to/from Stand ?Sit to Stand: Modified independent (Device/Increase time) ?  ?  ?  ?  ?  ?General transfer comment: able to push from armrests with BUE, slow controlled stand without pain or difficulty ?  ?  ?Balance Overall balance assessment: Needs assistance ?Sitting-balance support: Feet supported, No upper extremity supported ?Sitting balance-Leahy Scale: Good ?  ?  ?Standing balance support: During functional activity ?Standing balance-Leahy Scale: Fair ?Standing balance comment: fair static standing without UE support, benefits from DME for mobility for steadiness ?  ?  ?  ?  ?  ?  ?  ?  ?  ?  ?  ?   ? ?ADL either performed or assessed with clinical judgement  ? ?ADL Overall ADL's : Needs assistance/impaired ?  ?  ?  ?  ?  ?  ?  ?  ?  ?  ?  ?  ?  ?  ?  ?Toileting - Clothing Manipulation Details (indicate cue type and reason): Setup with urinal use, able to open/close urinal container without issue using R hand ?  ?  ?  ?General ADL Comments: Emphasis on R shoulder ROM assessment and progression of exercises with gravity minimized (tabletop towel pushes). Pt declined need for bathing, dressing  or toileting ADLs this afternoon ?  ? ?Extremity/Trunk Assessment Upper Extremity Assessment ?Upper Extremity Assessment: RUE deficits/detail ?RUE Deficits / Details: hand, wrist and elbow ROM WFL. difficulty lifting to 35* shoulder  flex, difficlty with abduction attempts. edema present but improving. with gravity minimized towel pushes, pt shlder flex to 35*, 50* with PROM ?LUE Deficits / Details: WFL ?  ?Lower Extremity Assessment ?Lower Extremity Assessment: Defer to PT evaluation ?  ?  ?  ? ?Vision   ?Vision Assessment?: No apparent visual deficits ?  ?Perception   ?  ?Praxis   ?  ? ?Cognition Arousal/Alertness: Awake/alert ?Behavior During Therapy: [facility] for tasks assessed/performed ?Overall Cognitive Status: No family/caregiver present to determine baseline cognitive functioning ?  ?  ?  ?  ?  ?  ?  ?  ?  ?  ?  ?  ?  ?  ?  ?  ?General Comments: STM deficits ?  ?  ?   ?Exercises Exercises: Other exercises ?Other Exercises ?Other Exercises: tabletop towel pushes (shoulder flex/ext, abd/adduct) ?Other Exercises: AROM hand, wrist and elbow WFL ? ?  ?Shoulder Instructions   ? ? ?  ?General Comments VSS on RA  ? ? ?Pertinent Vitals/ Pain       Pain Assessment ?Pain Assessment: Faces ?Faces Pain Scale: Hurts a little bit ?Pain Location: R shoulder sore, R ankle ?Pain Descriptors / Indicators: Sore ?Pain Intervention(s): Monitored during session ? ?Home Living   ?  ?  ?  ?  ?  ?  ?  ?  ?  ?  ?  ?  ?  ?  ?  ?  ?  ?  ? ?  ?Prior Functioning/Environment    ?  ?  ?  ?   ? ?Frequency ? Min 2X/week  ? ? ? ? ?  ?Progress Toward Goals ? ?OT Goals(current goals can now be found in the care plan section) ? Progress towards OT goals: Progressing toward goals ? ?Acute Rehab OT Goals ?Patient Stated Goal: get back to work ?OT Goal Formulation: With patient ?Time For Goal Achievement: 12/24/21 ?Potential to Achieve Goals: Good ?ADL Goals ?Pt Will Perform Upper Body Dressing: with modified independence ?Pt/caregiver will Perform Home Exercise Program: Right Upper extremity;Independently;With written HEP provided  ?Plan Discharge plan remains appropriate   ? ?Co-evaluation ? ? ?   ?  ?  ?  ?  ? ?  ?AM-PAC OT "6 Clicks" Daily Activity     ?Outcome Measure ? ? Help  from another person eating meals?: None ?Help from another person taking care of personal grooming?: A Little ?Help from another person toileting, which includes using toliet, bedpan, or urinal?: A Little ?Help from another person bathing (including washing, rinsing, drying)?: A Little ?Help from another person to put on and taking off regular upper body clothing?: A Little ?Help from another person to put on and taking off regular lower body clothing?: A Little ?6 Click Score: 19 ? ?  ?End of Session   ? ?OT Visit Diagnosis: Pain ?Pain - Right/Left: Right ?Pain - part of body: Shoulder ?  ?Activity Tolerance Patient tolerated treatment well ?  ?Patient Left in chair;with call bell/phone within reach;Other (comment) (dietary services at bedside) ?  ?Nurse Communication   ?  ? ?   ? ?Time: 6010-9323 ?OT Time Calculation (min): 12 min ? ?Charges: OT General Charges ?$OT Visit: 1 Visit ?OT Treatments ?$Therapeutic Exercise: 8-22 mins ? ?Bradd Canary, OTR/L ?Acute Rehab Services ?Office: [phone]  ? ?  Lorre Munroe ?12/13/2021, 12:48 PM ?

## 2021-12-13 NOTE — TOC Progression Note (Signed)
Transition of Care (TOC) - Progression Note  ? ? ?Patient Details  ?Name: David Strong ?MRN: [id number] ?Date of Birth: 14-Nov-1955 ? ?Transition of Care (TOC) CM/SW Contact  ?Beckie Busing, RN ?Phone Number:[phone] ? ?12/13/2021, 4:14 PM ? ?Clinical Narrative:    ?TOC continues to follow for patient that will require home infusion and HH  therapies upon discharge. Pam with [facility] and [place] with [facility] on standby to assist with disposition needs. TOC will continue to follow.  ? ? ?Expected Discharge Plan: Home/Self Care ?Barriers to Discharge: Barriers Resolved ? ?Expected Discharge Plan and Services ?Expected Discharge Plan: Home/Self Care ?  ?Discharge Planning Services: CM Consult ?Post Acute Care Choice: NA ?Living arrangements for the past 2 months: Single Family Home ?                ?  ?  ?  ?  ?  ?  ?  ?  ?  ?  ? ? ?Social Determinants of Health (SDOH) Interventions ?  ? ?Readmission Risk Interventions ?   ? View : No data to display.  ?  ?  ?  ? ? ?

## 2021-12-13 NOTE — Progress Notes (Signed)
? ?PROGRESS NOTE ? ? ? ?David Strong  VPX:[id number] DOB: Jul 21, 1956 DOA: 11/22/2021 ?PCP: [name], MD ? ? ?Brief Narrative: ? ?David Strong is a 66 y.o. male with a history of diabetes mellitus type 2.  Patient presented secondary to productive cough, fever, chills, myalgias and arthralgias.  Patient was found to have multifocal pneumonia.  During work-up, patient developed evidence of severe sepsis and was found to have streptococcal bacteremia.  Patient was managed on IV antibiotics with development of septic arthritis of his left sternoclavicular joint.  Cardiothoracic surgery was consulted and performed I&D with wound VAC placement for treatment.  Endocarditis work-up was negative.  Patient with plan for 6 weeks of IV antibiotics total.  ? ?Assessment and Plan: ? ?Severe sepsis ?Does not appear to have been present on admission per H&P. Secondary to streptococcal infection as mentioned below. Patient empirically treated with Vancomycin, Cefepime and Azithromycin and transitioned to Ceftriaxone and finally to Penicillin G. Sepsis now resolved. ? ?Streptococcal multifocal pneumonia ?Streptococcal bacteremia ?ID consulted with recommendations for 6 week total of IV antibiotics. Patient currently on Penicillin G BID. PICC line in place for outpatient antibiotics. Transthoracic Echocardiogram and Transesophageal Echocardiogram negative for valvular vegetations. ?-Continue Penicillin IV ? ?Septic left sternoclavicular joint ?Septic arthritis ?Secondary to above infections. Patient underwent Left supraclavicular joint I&D of left sternoclavicular joint abscess and subsequent placement of a wound vac, per cardiothoracic surgery. ?-Cardiothoracic surgery recommendations: continue wound vac ?-Antibiotics as mentioned above ? ?Polyarthralgia ?Related to infection and possible inflammatory changes to joints/cartilage ? ?Left knee abscesses ?Noted on MRI dated 4/22 multiple abscesses noted. Orthopedic surgery  consulted and recommend against I&D of abscesses at this time, and rather recommend continued antibiotic therapy. ? ?Right shoulder myositis ?MRI performed on 4/11. Right shoulder with initial concern for septic joint. Orthopedic surgery consulted and performed aspiration which was dry. Per orthopedic, presentation consistent with myositis. ? ?DVT ?Acute left LE involving left gastrocnemius and left posterior tibial veins. Age indeterminate DVT of left popliteal vein. Hematology consulted and attributed thromboses to sepsis. Patient initially managed on Heparin IV and has been transitioned to Eliquis. ? ?Anasarca ?Likely related to poor albumin. Patient started on Lasix PO ?-Lasix 40 mg IV, reassess daily ? ?Elevated AST/ALT/alkaline phosphatase ?Mild. Asymptomatic. In setting of sepsis. GI consulted and have signed off. ? ?High anion gap metabolic acidosis ?Lactic acidosis ?Secondary to acute illness/sepsis. Resolved with treatment. ? ?Thrombocytopenia ?Present on admission with resolution. Possibly related to acute illness/sepsis. ? ?Diabetes mellitus, type 2 ?Hemoglobin A1C of 8.6 on 11/23/21. Patient is on Farxiga and Glipizide XL as an outpatient. Patient started on [place], Novolog for meal coverage and SSI this admission. ?-Continue Semglee 35 units daily, Novolog 8 units TID with meals and SSI ? ?Hypokalemia ?Hypomagnesemia ?Hypophosphatemia ?Supplementation given with resolution. ? ?Constipation ?Resolved with bowel regimen. ? ?Hyponatremia ?Resolved. ? ?Acute blood loss anemia ?Baseline hemoglobin around 14-15. Downward drift, likely secondary to perioperative blood loss. Hemoglobin currently stable at 8.6-9. ? ?Increased nutrient needs ?Secondary to acute illness. Albumin undetectable. Registered dietitian consulted. ?Dietitian recommendations (4/26): ? Continue Ensure Max po BID, each supplement provides 150 kcal and 30 grams of protein ?Continue double protein portions TID with meals ?Continue MVI with  minerals daily ?Encourage PO intake ? ? ?DVT prophylaxis: Eliquis ?Code Status:   Code Status: Full Code ?Family Communication: None at bedside ?Disposition Plan: Discharge home pending CT surgery recommendations for discharge from a wound standpoint. Patient to discharge home with IV antibiotics ? ? ?Consultants:  ?  Cardiothoracic surgery ?Infectious disease ?Orthopedic surgery ?Gastroenterology ?Hematology/oncology ? ?Procedures:  ?Incision and drainage of left sternoclavicular joint abscess with excisional debridement and pulse lavage (4/17) ?Wound VAC change and excisional debridement of left sternoclavicular joint abscess (4/20) ? ?Antimicrobials: ?Vancomycin ?Pencillin G  ?Ceftriaxone ?Cefepime ? ? ?Subjective: ?Patient worried about his swelling. No other issues overnight. ? ?Objective: ?BP (!) 134/94   Pulse 94   Temp 98.6 ?F (37 ?C) (Oral)   Resp 14   Ht 5\' 4"  (1.626 m)   Wt 79.2 kg   SpO2 94%   BMI 29.97 kg/m?  ? ?Examination: ? ?General exam: Appears calm and comfortable ?Respiratory system: Mild LLL rales. Respiratory effort normal. ?Cardiovascular system: S1 & S2 heard, RRR. No murmurs, rubs, gallops or clicks. ?Gastrointestinal system: Abdomen is nondistended, soft and nontender. No organomegaly or masses felt. Normal bowel sounds heard. ?Central nervous system: Alert and oriented. No focal neurological deficits. ?Musculoskeletal: 2+ BU/LE edema. No calf tenderness ?Skin: No cyanosis. No rashes ?Psychiatry: Judgement and insight appear normal. Mood & affect appropriate.  ? ? ?Data Reviewed: I have personally reviewed following labs and imaging studies ? ?CBC ?Lab Results  ?Component Value Date  ? WBC 5.2 12/13/2021  ? RBC 3.03 (L) 12/13/2021  ? HGB 8.7 (L) 12/13/2021  ? HCT 27.5 (L) 12/13/2021  ? MCV 90.8 12/13/2021  ? MCH 28.7 12/13/2021  ? PLT 304 12/13/2021  ? MCHC 31.6 12/13/2021  ? RDW 13.4 12/13/2021  ? LYMPHSABS 1.2 12/13/2021  ? MONOABS 0.5 12/13/2021  ? EOSABS 0.1 12/13/2021  ? BASOSABS  0.0 12/13/2021  ? ? ? ?Last metabolic panel ?Lab Results  ?Component Value Date  ? NA 136 12/12/2021  ? K 3.6 12/12/2021  ? CL 99 12/12/2021  ? CO2 30 12/12/2021  ? BUN 7 (L) 12/12/2021  ? CREATININE 0.62 12/12/2021  ? GLUCOSE 200 (H) 12/12/2021  ? GFRNONAA >60 12/12/2021  ? GFRAA 87 04/07/2020  ? CALCIUM 8.1 (L) 12/12/2021  ? PHOS 2.9 12/13/2021  ? PROT 5.5 (L) 12/11/2021  ? ALBUMIN <1.5 (L) 12/11/2021  ? LABGLOB 2.3 04/07/2020  ? AGRATIO 2.0 04/07/2020  ? BILITOT 0.6 12/11/2021  ? ALKPHOS 152 (H) 12/11/2021  ? AST 19 12/11/2021  ? ALT 20 12/11/2021  ? ANIONGAP 7 12/12/2021  ? ? ?GFR: ?Estimated Creatinine Clearance: 87.5 mL/min (by C-G formula based on SCr of 0.62 mg/dL). ? ?Recent Results (from the past 240 hour(s))  ?Aerobic/Anaerobic Culture w Gram Stain (surgical/deep wound)     Status: None  ? Collection Time: 12/04/21  2:41 PM  ? Specimen: PATH Other; Body Fluid  ?Result Value Ref Range Status  ? Specimen Description FLUID  Final  ? Special Requests LEFT sTERNOCLAVICULAR JOINT SPEC B  Final  ? Gram Stain   Final  ?  FEW WBC PRESENT,BOTH PMN AND MONONUCLEAR ?NO ORGANISMS SEEN ?  ? Culture   Final  ?  No growth aerobically or anaerobically. ?Performed at [facility], [address] [place], [place] [address] ?  ? Report Status 12/09/2021 FINAL  Final  ?Fungus Culture With Stain     Status: None (Preliminary result)  ? Collection Time: 12/04/21  2:43 PM  ? Specimen: PATH Other; Body Fluid  ?Result Value Ref Range Status  ? Fungus Stain Final report  Final  ?  Comment: (NOTE) ?Performed At: [facility] ?[address] [place], [place] [id number] ?Jolene SchimkeNagendra Sanjai MD YQ:[phone] ?  ? Fungus (Mycology) Culture PENDING  Incomplete  ? Fungal Source  ABSCESS  Final  ?  Comment: LEFT STERNOCLAVICULAR JOINT ?Performed at [facility], [address] [place], [place] [address] ?  ?Aerobic/Anaerobic Culture w Gram Stain (surgical/deep wound)     Status: None  ? Collection Time: 12/04/21  2:43 PM  ?  Specimen: PATH Other; Body Fluid  ?Result Value Ref Range Status  ? Specimen Description ABSCESS  Final  ? Special Requests LEFT STERNOCLAVICULAR JOINT SWAB SPEC A  Final  ? Gram Stain   Final  ?  FEW WBC PRESEN

## 2021-12-13 NOTE — Progress Notes (Signed)
TRH night cross cover note: ? ?I was notified by RN of the patient's CBG result of 67, which improved to 114 following interval juice as well as eating dinner, with the patient noted to be asymptomatic throughout this sequence. ? ? ? ? ?[name], DO ?Hospitalist ? ?

## 2021-12-13 NOTE — Consult Note (Signed)
Consult received from Dr. Roxan Hockey for twice weekly vac dressing changes beginning tomorrow, Thursday 4/27. ? ?WOC can follow for changes on Mon/Thurs  ? ?Cathlean Marseilles Tamala Julian, MSN, RN, CMSRN, AGCNS, WTA ?Wound Treatment Associate ?Pager [phone]  ?

## 2021-12-14 DIAGNOSIS — J189 Pneumonia, unspecified organism: Secondary | ICD-10-CM | POA: Diagnosis not present

## 2021-12-14 DIAGNOSIS — A419 Sepsis, unspecified organism: Secondary | ICD-10-CM | POA: Diagnosis not present

## 2021-12-14 DIAGNOSIS — I82402 Acute embolism and thrombosis of unspecified deep veins of left lower extremity: Secondary | ICD-10-CM | POA: Diagnosis not present

## 2021-12-14 DIAGNOSIS — R600 Localized edema: Secondary | ICD-10-CM | POA: Diagnosis not present

## 2021-12-14 LAB — CBC WITH DIFFERENTIAL/PLATELET
Abs Immature Granulocytes: 0.02 10*3/uL (ref 0.00–0.07)
Basophils Absolute: 0 10*3/uL (ref 0.0–0.1)
Basophils Relative: 0 %
Eosinophils Absolute: 0 10*3/uL (ref 0.0–0.5)
Eosinophils Relative: 1 %
HCT: 26.1 % — ABNORMAL LOW (ref 39.0–52.0)
Hemoglobin: 8.6 g/dL — ABNORMAL LOW (ref 13.0–17.0)
Immature Granulocytes: 0 %
Lymphocytes Relative: 27 %
Lymphs Abs: 1.3 10*3/uL (ref 0.7–4.0)
MCH: 29.6 pg (ref 26.0–34.0)
MCHC: 33 g/dL (ref 30.0–36.0)
MCV: 89.7 fL (ref 80.0–100.0)
Monocytes Absolute: 0.6 10*3/uL (ref 0.1–1.0)
Monocytes Relative: 13 %
Neutro Abs: 2.8 10*3/uL (ref 1.7–7.7)
Neutrophils Relative %: 59 %
Platelets: 303 10*3/uL (ref 150–400)
RBC: 2.91 MIL/uL — ABNORMAL LOW (ref 4.22–5.81)
RDW: 13.4 % (ref 11.5–15.5)
WBC: 4.8 10*3/uL (ref 4.0–10.5)
nRBC: 0 % (ref 0.0–0.2)

## 2021-12-14 LAB — PHOSPHORUS: Phosphorus: 3.6 mg/dL (ref 2.5–4.6)

## 2021-12-14 LAB — GLUCOSE, CAPILLARY
Glucose-Capillary: 258 mg/dL — ABNORMAL HIGH (ref 70–99)
Glucose-Capillary: 139 mg/dL — ABNORMAL HIGH (ref 70–99)
Glucose-Capillary: 138 mg/dL — ABNORMAL HIGH (ref 70–99)
Glucose-Capillary: 171 mg/dL — ABNORMAL HIGH (ref 70–99)

## 2021-12-14 LAB — BASIC METABOLIC PANEL
Anion gap: 6 (ref 5–15)
BUN: 6 mg/dL — ABNORMAL LOW (ref 8–23)
CO2: 30 mmol/L (ref 22–32)
Calcium: 8.1 mg/dL — ABNORMAL LOW (ref 8.9–10.3)
Chloride: 99 mmol/L (ref 98–111)
Creatinine, Ser: 0.58 mg/dL — ABNORMAL LOW (ref 0.61–1.24)
GFR, Estimated: >60 mL/min (ref >60)
Glucose, Bld: 149 mg/dL — ABNORMAL HIGH (ref 70–99)
Potassium: 3.8 mmol/L (ref 3.5–5.1)
Sodium: 135 mmol/L (ref 135–145)

## 2021-12-14 LAB — MAGNESIUM: Magnesium: 1.7 mg/dL (ref 1.7–2.4)

## 2021-12-14 MED ORDER — FUROSEMIDE 10 MG/ML IJ SOLN
40.0000 mg | Freq: Once | INTRAMUSCULAR | Status: AC
  Administered 2021-12-14: 40 mg via INTRAVENOUS
  Filled 2021-12-14: qty 4

## 2021-12-14 NOTE — Plan of Care (Signed)

## 2021-12-14 NOTE — Progress Notes (Signed)
? ?   ?  [address] ?      [place] [address] ?            [phone]   ? ?  6 Days Post-Op Procedure(s) (LRB): ?TRANSESOPHAGEAL ECHOCARDIOGRAM (TEE) (N/A) ?Subjective: ?Feels the "same" ? ?Objective: ?Vital signs in last 24 hours: ?Temp:  [97.8 ?F (36.6 ?C)-98.7 ?F (37.1 ?C)] 98.2 ?F (36.8 ?C) (04/27 0411) ?Pulse Rate:  [92-102] 92 (04/27 0411) ?Cardiac Rhythm: Normal sinus rhythm (04/26 1925) ?Resp:  [14-19] 16 (04/27 0411) ?BP: (115-139)/(81-94) 130/81 (04/27 0411) ?SpO2:  [94 %-97 %] 95 % (04/27 0411) ?Weight:  [76.7 kg] 76.7 kg (04/27 0411) ? ?Hemodynamic parameters for last 24 hours: ?  ? ?Intake/Output from previous day: ?04/26 0701 - 04/27 0700 ?In: 720 [P.O.:720] ?Out: 3050 [Urine:3050] ?Intake/Output this shift: ?No intake/output data recorded. ? ?General appearance: alert, cooperative, and no distress ?Wound: Vac in place, no changes  ? ?Lab Results: ?Recent Labs  ?  12/13/21 ?0438 12/14/21 ?[id number]  ?WBC 5.2 4.8  ?HGB 8.7* 8.6*  ?HCT 27.5* 26.1*  ?PLT 304 303  ? ?BMET:  ?Recent Labs  ?  12/12/21 ?0432  ?NA 136  ?K 3.6  ?CL 99  ?CO2 30  ?GLUCOSE 200*  ?BUN 7*  ?CREATININE 0.62  ?CALCIUM 8.1*  ?  ?PT/INR: No results for input(s): LABPROT, INR in the last 72 hours. ?ABG ?No results found for: PHART, HCO3, TCO2, ACIDBASEDEF, O2SAT ?CBG (last 3)  ?Recent Labs  ?  12/13/21 ?1617 12/13/21 ?2121 12/14/21 ?0609  ?GLUCAP 133* 164* 258*  ? ? ?Meds ?Scheduled Meds: ? apixaban  5 mg Oral BID  ? bisacodyl  10 mg Oral Daily  ? celecoxib  200 mg Oral Daily  ? Chlorhexidine Gluconate Cloth  6 each Topical Daily  ? insulin aspart  0-15 Units Subcutaneous TID WC  ? insulin aspart  0-5 Units Subcutaneous QHS  ? insulin aspart  8 Units Subcutaneous TID WC  ? insulin glargine-yfgn  35 Units Subcutaneous Daily  ? lidocaine  1 patch Transdermal Q24H  ? multivitamin with minerals  1 tablet Oral Daily  ? polyethylene glycol  17 g Oral BID  ? Ensure Max Protein  11 oz Oral BID  ? senna-docusate  1 tablet Oral QHS   ? senna-docusate  2 tablet Oral Daily  ? simethicone  80 mg Oral QID  ? ?Continuous Infusions: ? penicillin g continuous IV infusion 12 Million Units (12/14/21 0622)  ? ?PRN Meds:.acetaminophen **OR** [DISCONTINUED] acetaminophen, alum & mag hydroxide-simeth, benzonatate, bisacodyl, diphenhydrAMINE, diphenhydrAMINE-zinc acetate, fentaNYL (SUBLIMAZE) injection, hydrALAZINE, methocarbamol, naLOXone (NARCAN)  injection, ondansetron (ZOFRAN) IV, oxyCODONE, oxyCODONE, sodium chloride flush ? ?Xrays ?No results found. ? ?Assessment/Plan: ?S/P Procedure(s) (LRB): ?TRANSESOPHAGEAL ECHOCARDIOGRAM (TEE) (N/A) ? ?1 afeb, VSS ?2 sats good on RA ?3 plan for VAC change ?4 no leukocytosis or left shift, conts Pen G ?5 TOC on board as will need home care for IVABX and nursing for [facility] care ? ? LOS: 22 days  ? ? ?John Giovanni ?12/14/2021 ?  ?

## 2021-12-14 NOTE — Progress Notes (Signed)
Physical Therapy Treatment ?Patient Details ?Name: David Strong ?MRN: [id number] ?DOB: 08-26-1955 ?Today's Date: 12/14/2021 ? ? ?History of Present Illness Pt. is a 66 y.o. M presenting to [facility] 4/5 Admitted for severe sepsis, POA due to streptococcal pneumonia with bacteremia, left sternoclavicular joint septic arthritis and other large joint arthralgia/arthritis (right shoulder, left knee). Pt underwent I&D of [place] joint with wound vac placement on 4/17. PMH significant for T2DM.  CTA showed no PE, confirmed multifocal pneumonia and MRI shows left sternoclavicular joint inflammatory changes with scattered foci of air within the soft tissues and sternoclavicular joint space compatible with localized left sternoclavicular septic arthritis/cellulitis, without evidence of drainable fluid collection or large joint effusion.  Subsequent BCID and urine antigen positive for strep.  Antibiotics narrowed to IV ceftriaxone.  IR ultrasound shows +RUE superficial thrombosis and L LE DVT. ? ?  ?PT Comments  ? ? Pt is making good, steady progress with mobility. He was able to navigate x3 stairs with one handrail and ambulate up to ~70 ft without UE support at a min guard assist level today. Pt displayed increased shuffling and a slower gait speed as distance ambulating without UE support progressed secondary to increasing low back pain. Pt is very motivated to improve and return to his baseline. Will continue to follow acutely. Current recommendations remain appropriate. ?  ?Recommendations for follow up therapy are one component of a multi-disciplinary discharge planning process, led by the attending physician.  Recommendations may be updated based on patient status, additional functional criteria and insurance authorization. ? ?Follow Up Recommendations ? Home health PT ?  ?  ?Assistance Recommended at Discharge Intermittent Supervision/Assistance  ?Patient can return home with the following A little help with walking and/or  transfers;A little help with bathing/dressing/bathroom;Assistance with cooking/housework;Assist for transportation;Help with stairs or ramp for entrance ?  ?Equipment Recommendations ? Rolling walker (2 wheels)  ?  ?Recommendations for Other Services   ? ? ?  ?Precautions / Restrictions Precautions ?Precautions: Fall;Other (comment) ?Precaution Comments: wound vac chest ?Restrictions ?Weight Bearing Restrictions: No ?Other Position/Activity Restrictions: 4/21 per CT surgery PA no restrictions for UE  ?  ? ?Mobility ? Bed Mobility ?  ?  ?  ?  ?  ?  ?  ?General bed mobility comments: sitting EOB upon arrival ?  ? ?Transfers ?Overall transfer level: Needs assistance ?Equipment used: Rolling walker (2 wheels) ?Transfers: Sit to/from Stand ?Sit to Stand: Supervision ?  ?  ?  ?  ?  ?General transfer comment: Able to come to stand without assistance or LOB, supervision for safety ?  ? ?Ambulation/Gait ?Ambulation/Gait assistance: Min guard, Supervision ?Gait Distance (Feet): 160 Feet ?Assistive device: Rolling walker (2 wheels), None ?Gait Pattern/deviations: Step-through pattern, Decreased stride length, Trunk flexed, Shuffle ?Gait velocity: reduced ?Gait velocity interpretation: <1.31 ft/sec, indicative of household ambulator ?  ?General Gait Details: Pt ambulating initial ~45 ft with RW then ~70 ft no UE support then final ~45 ft with RW. Pt with flexed posture and slow, but steady gait. Decreased stride length and feet clearance noted along with decreased speed when pt ambulating without UE support, reporting increased low back pain as distance progressed. Min guard-supervision for safety ? ? ?Stairs ?Stairs: Yes ?Stairs assistance: Min guard ?Stair Management: One rail Left, One rail Right, Step to pattern, Forwards ?Number of Stairs: 3 ?General stair comments: Descends with L rail and ascends with R rail with slow, step-to pattern. No LOB, min guard for safety. ? ? ?Wheelchair Mobility ?  ? ?  Modified Rankin (Stroke  Patients Only) ?  ? ? ?  ?Balance Overall balance assessment: Needs assistance ?Sitting-balance support: Feet supported, No upper extremity supported ?Sitting balance-Leahy Scale: Good ?  ?  ?Standing balance support: During functional activity, No upper extremity supported, Bilateral upper extremity supported ?Standing balance-Leahy Scale: Fair ?Standing balance comment: Able to ambulate without UE support but benefits from RW for improved stability, speed, and pain control ?  ?  ?  ?  ?  ?  ?  ?  ?  ?  ?  ?  ? ?  ?Cognition Arousal/Alertness: Awake/alert ?Behavior During Therapy: [facility] for tasks assessed/performed ?Overall Cognitive Status: No family/caregiver present to determine baseline cognitive functioning ?  ?  ?  ?  ?  ?  ?  ?  ?  ?  ?  ?  ?  ?  ?  ?  ?General Comments: STM deficits ?  ?  ? ?  ?Exercises   ? ?  ?General Comments General comments (skin integrity, edema, etc.): VSS on RA ?  ?  ? ?Pertinent Vitals/Pain Pain Assessment ?Pain Assessment: Faces ?Faces Pain Scale: Hurts even more ?Pain Location: L knee, low back, lower abdomen/groin bil, shoulders, elbows, wrists ?Pain Descriptors / Indicators: Sore, Discomfort, Grimacing, Guarding ?Pain Intervention(s): Limited activity within patient's tolerance, Monitored during session, Repositioned  ? ? ?Home Living   ?  ?  ?  ?  ?  ?  ?  ?  ?  ?   ?  ?Prior Function    ?  ?  ?   ? ?PT Goals (current goals can now be found in the care plan section) Acute Rehab PT Goals ?Patient Stated Goal: to return to playing with his infant son ?PT Goal Formulation: With patient ?Time For Goal Achievement: 12/25/21 ?Potential to Achieve Goals: Good ?Progress towards PT goals: Progressing toward goals ? ?  ?Frequency ? ? ? Min 3X/week ? ? ? ?  ?PT Plan Current plan remains appropriate  ? ? ?Co-evaluation   ?  ?  ?  ?  ? ?  ?AM-PAC PT "6 Clicks" Mobility   ?Outcome Measure ? Help needed turning from your back to your side while in a flat bed without using bedrails?: A  Little ?Help needed moving from lying on your back to sitting on the side of a flat bed without using bedrails?: A Little ?Help needed moving to and from a bed to a chair (including a wheelchair)?: A Little ?Help needed standing up from a chair using your arms (e.g., wheelchair or bedside chair)?: A Little ?Help needed to walk in hospital room?: A Little ?Help needed climbing 3-5 steps with a railing? : A Little ?6 Click Score: 18 ? ?  ?End of Session Equipment Utilized During Treatment: Gait belt ?Activity Tolerance: Patient tolerated treatment well ?Patient left: in chair;with call bell/phone within reach ?Nurse Communication: Mobility status ?PT Visit Diagnosis: Difficulty in walking, not elsewhere classified (R26.2);Pain;Unsteadiness on feet (R26.81);Other abnormalities of gait and mobility (R26.89) ?Pain - Right/Left:  (bil) ?Pain - part of body: Knee;Shoulder;Arm;Hand (low back) ?  ? ? ?Time: [id number] ?PT Time Calculation (min) (ACUTE ONLY): 20 min ? ?Charges:  $Gait Training: 8-22 mins          ?          ? ?David Strong, PT, DPT ?Acute Rehabilitation Services  ?Pager: [phone] ?Office: [phone] ? ? ? ?Maretta Bees Pettis ?12/14/2021, 5:09 PM ? ?

## 2021-12-14 NOTE — Progress Notes (Signed)
Mobility Specialist Progress Note: ? ? 12/14/21 1010  ?Mobility  ?Activity Dangled on edge of bed  ?Level of Assistance Independent  ?Assistive Device None  ?Activity Response Tolerated well  ?$Mobility charge 1 Mobility  ? ?Pt agreeable to mobility session. Session limited d/t needing to take blood. Will f/u for ambulation.  ? ?David Strong ?Acute Rehab ?Phone: [phone] ?Office Phone: [phone] ? ?

## 2021-12-14 NOTE — Telephone Encounter (Signed)
FMLA Paperwork was dropped off.  ? ?Placed in provider's mailbox. Please call pt wife Milbert Coulter MRN: [id number] when paperwork is completed.  ? ?Reminded pt that forms can take up to 7 days to complete.  ?

## 2021-12-14 NOTE — Progress Notes (Signed)
? ?PROGRESS NOTE ? ? ? ?David Strong  FXT:[id number] DOB: 1955/12/24 DOA: 11/22/2021 ?PCP: [name], MD ? ? ?Brief Narrative: ? ?David Strong is a 66 y.o. male with a history of diabetes mellitus type 2.  Patient presented secondary to productive cough, fever, chills, myalgias and arthralgias.  Patient was found to have multifocal pneumonia.  During work-up, patient developed evidence of severe sepsis and was found to have streptococcal bacteremia.  Patient was managed on IV antibiotics with development of septic arthritis of his left sternoclavicular joint.  Cardiothoracic surgery was consulted and performed I&D with wound VAC placement for treatment.  Endocarditis work-up was negative.  Patient with plan for 6 weeks of IV antibiotics total.  ? ?Assessment and Plan: ? ?Severe sepsis ?Does not appear to have been present on admission per H&P. Secondary to streptococcal infection as mentioned below. Patient empirically treated with Vancomycin, Cefepime and Azithromycin and transitioned to Ceftriaxone and finally to Penicillin G. Sepsis now resolved. ? ?Streptococcal multifocal pneumonia ?Streptococcal bacteremia ?ID consulted with recommendations for 6 week total of IV antibiotics. Patient currently on Penicillin G BID. PICC line in place for outpatient antibiotics. Transthoracic Echocardiogram and Transesophageal Echocardiogram negative for valvular vegetations. ?-Continue Penicillin IV ? ?Septic left sternoclavicular joint ?Septic arthritis ?Secondary to above infections. Patient underwent Left supraclavicular joint I&D of left sternoclavicular joint abscess and subsequent placement of a wound vac, per cardiothoracic surgery. ?-Cardiothoracic surgery recommendations: continue wound vac ?-Antibiotics as mentioned above ? ?Polyarthralgia ?Related to infection and possible inflammatory changes to joints/cartilage ? ?Left knee abscesses ?Noted on MRI dated 4/22 multiple abscesses noted. Orthopedic surgery  consulted and recommend against I&D of abscesses at this time, and rather recommend continued antibiotic therapy. ? ?Right shoulder myositis ?MRI performed on 4/11. Right shoulder with initial concern for septic joint. Orthopedic surgery consulted and performed aspiration which was dry. Per orthopedic, presentation consistent with myositis. ? ?DVT ?Acute left LE involving left gastrocnemius and left posterior tibial veins. Age indeterminate DVT of left popliteal vein. Hematology consulted and attributed thromboses to sepsis. Patient initially managed on Heparin IV and has been transitioned to Eliquis. ? ?Anasarca ?Likely related to poor albumin. Patient started on Lasix PO and now transitioned to Lasix IV. Weight down to 76.7 kg today ?-Lasix 40 mg IV again today, reassess daily. Will continue as blood pressure/kidney function allow ? ?Elevated AST/ALT/alkaline phosphatase ?Mild. Asymptomatic. In setting of sepsis. GI consulted and have signed off. ? ?High anion gap metabolic acidosis ?Lactic acidosis ?Secondary to acute illness/sepsis. Resolved with treatment. ? ?Thrombocytopenia ?Present on admission with resolution. Possibly related to acute illness/sepsis. ? ?Diabetes mellitus, type 2 ?Hemoglobin A1C of 8.6 on 11/23/21. Patient is on Farxiga and Glipizide XL as an outpatient. Patient started on [place], Novolog for meal coverage and SSI this admission. ?-Continue Semglee 35 units daily, Novolog 8 units TID with meals and SSI ? ?Hypokalemia ?Hypomagnesemia ?Hypophosphatemia ?Supplementation given with resolution. ? ?Constipation ?Resolved with bowel regimen. ? ?Hyponatremia ?Resolved. ? ?Acute blood loss anemia ?Baseline hemoglobin around 14-15. Downward drift, likely secondary to perioperative blood loss. Hemoglobin currently stable at 8.6 - 9. ? ?Increased nutrient needs ?Secondary to acute illness. Albumin undetectable. Registered dietitian consulted. ?Dietitian recommendations (4/26): ? Continue Ensure Max po  BID, each supplement provides 150 kcal and 30 grams of protein ?Continue double protein portions TID with meals ?Continue MVI with minerals daily ?Encourage PO intake ? ? ?DVT prophylaxis: Eliquis ?Code Status:   Code Status: Full Code ?Family Communication: None at bedside ?Disposition  Plan: Discharge home pending CT surgery recommendations for discharge from a wound standpoint. Patient to discharge home with IV antibiotics ? ? ?Consultants:  ?Cardiothoracic surgery ?Infectious disease ?Orthopedic surgery ?Gastroenterology ?Hematology/oncology ? ?Procedures:  ?Incision and drainage of left sternoclavicular joint abscess with excisional debridement and pulse lavage (4/17) ?Wound VAC change and excisional debridement of left sternoclavicular joint abscess (4/20) ? ?Antimicrobials: ?Vancomycin ?Pencillin G  ?Ceftriaxone ?Cefepime ? ? ?Subjective: ?Swelling has improved, per patient. Wound vac dressing changed this morning. ? ?Objective: ?BP 119/80   Pulse 92   Temp 98.4 ?F (36.9 ?C) (Oral)   Resp 10   Ht 5\' 4"  (1.626 m)   Wt 76.7 kg   SpO2 96%   BMI 29.02 kg/m?  ? ?Examination: ? ?General exam: Appears calm and comfortable ?Respiratory system: Clear to auscultation. Respiratory effort normal. ?Cardiovascular system: S1 & S2 heard, RRR. ?Gastrointestinal system: Abdomen is nondistended, soft and nontender. Normal bowel sounds heard. ?Central nervous system: Alert and oriented. No focal neurological deficits. ?Musculoskeletal: Upper extremity edema is improved. BLE 2+ pitting edema mostly of ankles. No calf tenderness ?Skin: No cyanosis. No rashes ?Psychiatry: Judgement and insight appear normal. Mood & affect appropriate.   ? ? ?Data Reviewed: I have personally reviewed following labs and imaging studies ? ?CBC ?Lab Results  ?Component Value Date  ? WBC 4.8 12/14/2021  ? RBC 2.91 (L) 12/14/2021  ? HGB 8.6 (L) 12/14/2021  ? HCT 26.1 (L) 12/14/2021  ? MCV 89.7 12/14/2021  ? MCH 29.6 12/14/2021  ? PLT 303  12/14/2021  ? MCHC 33.0 12/14/2021  ? RDW 13.4 12/14/2021  ? LYMPHSABS 1.3 12/14/2021  ? MONOABS 0.6 12/14/2021  ? EOSABS 0.0 12/14/2021  ? BASOSABS 0.0 12/14/2021  ? ? ? ?Last metabolic panel ?Lab Results  ?Component Value Date  ? NA 135 12/14/2021  ? K 3.8 12/14/2021  ? CL 99 12/14/2021  ? CO2 30 12/14/2021  ? BUN 6 (L) 12/14/2021  ? CREATININE 0.58 (L) 12/14/2021  ? GLUCOSE 149 (H) 12/14/2021  ? GFRNONAA >60 12/14/2021  ? GFRAA 87 04/07/2020  ? CALCIUM 8.1 (L) 12/14/2021  ? PHOS 3.6 12/14/2021  ? PROT 5.5 (L) 12/11/2021  ? ALBUMIN <1.5 (L) 12/11/2021  ? LABGLOB 2.3 04/07/2020  ? AGRATIO 2.0 04/07/2020  ? BILITOT 0.6 12/11/2021  ? ALKPHOS 152 (H) 12/11/2021  ? AST 19 12/11/2021  ? ALT 20 12/11/2021  ? ANIONGAP 6 12/14/2021  ? ? ?GFR: ?Estimated Creatinine Clearance: 86.2 mL/min (A) (by C-G formula based on SCr of 0.58 mg/dL (L)). ? ?Recent Results (from the past 240 hour(s))  ?Aerobic/Anaerobic Culture w Gram Stain (surgical/deep wound)     Status: None  ? Collection Time: 12/04/21  2:41 PM  ? Specimen: PATH Other; Body Fluid  ?Result Value Ref Range Status  ? Specimen Description FLUID  Final  ? Special Requests LEFT sTERNOCLAVICULAR JOINT SPEC B  Final  ? Gram Stain   Final  ?  FEW WBC PRESENT,BOTH PMN AND MONONUCLEAR ?NO ORGANISMS SEEN ?  ? Culture   Final  ?  No growth aerobically or anaerobically. ?Performed at [facility], [address] [place], [place] ?  ? Report Status 12/09/2021 FINAL  Final  ?Fungus Culture With Stain     Status: None (Preliminary result)  ? Collection Time: 12/04/21  2:43 PM  ? Specimen: PATH Other; Body Fluid  ?Result Value Ref Range Status  ? Fungus Stain Final report  Final  ?  Comment: (NOTE) ?Performed  At: [facility] ?[address] [place], [place] [id number] ?Jolene SchimkeNagendra Sanjai MD WU:[phone] ?  ? Fungus (Mycology) Culture PENDING  Incomplete  ? Fungal Source ABSCESS  Final  ?  Comment: LEFT STERNOCLAVICULAR JOINT ?Performed at [facility],  [address] [place], [place] [address] ?  ?Aerobic/Anaerobic Culture w Gram Stain (surgical/deep wound)     Status: None  ? Collection Time: 12/04/21  2:43 PM  ? Specimen: PATH Other; Body Fluid  ?Result Value Ref Range

## 2021-12-14 NOTE — Progress Notes (Signed)
Mobility Specialist Progress Note: ? ? 12/14/21 1055  ?Mobility  ?Activity Ambulated with assistance in hallway  ?Level of Assistance Standby assist, set-up cues, supervision of patient - no hands on  ?Assistive Device Front wheel walker  ?Distance Ambulated (ft) 200 ft  ?Activity Response Tolerated well  ?$Mobility charge 1 Mobility  ? ?Pt agreeable to mobility session. No physical assistance required. Pt left in chair with all needs met.  ? ?Nelta Numbers ?Acute Rehab ?Phone: [phone] ?Office Phone: [phone] ? ?

## 2021-12-14 NOTE — Progress Notes (Signed)
Magnesium level-1.7. MD made aware with no order. ?

## 2021-12-14 NOTE — Consult Note (Signed)
WOC Nurse Consult Note: ?Patient receiving care in [facility] 2C17 ?Reason for Consult: vac dressing change ?Wound type: ?Pressure Injury POA: Yes/No/NA ?Measurement: 8 x 2 x 2.8 ?Wound bed: Mostly granulating with some minor bleeding at removal of the foam ?Drainage (amount, consistency, odor) serosanguinous ?Periwound: intact ?Dressing procedure/placement/frequency: ?Dressing gently removed. 1 piece black foam and 1 piece white foam. Replaced with 1 piece white and 1 piece black foam in the joint space and a thin small piece over the white foam. Drape applied, immediate suction obtained at 125 mmHg. Patient premedicated and tolerated the procedure well. ? ?WOC will follow and change vac dressing on Monday/Thursday. ? ?Renaldo Reel. Katrinka Blazing, MSN, RN, CMSRN, AGCNS, WTA ?Wound Treatment Associate ?Pager [phone]  ? ? ?  ?

## 2021-12-15 DIAGNOSIS — R600 Localized edema: Secondary | ICD-10-CM | POA: Diagnosis not present

## 2021-12-15 DIAGNOSIS — A419 Sepsis, unspecified organism: Secondary | ICD-10-CM | POA: Diagnosis not present

## 2021-12-15 DIAGNOSIS — J189 Pneumonia, unspecified organism: Secondary | ICD-10-CM | POA: Diagnosis not present

## 2021-12-15 DIAGNOSIS — I82402 Acute embolism and thrombosis of unspecified deep veins of left lower extremity: Secondary | ICD-10-CM | POA: Diagnosis not present

## 2021-12-15 LAB — CBC WITH DIFFERENTIAL/PLATELET
Abs Immature Granulocytes: 0.02 10*3/uL (ref 0.00–0.07)
Basophils Absolute: 0 10*3/uL (ref 0.0–0.1)
Basophils Relative: 0 %
Eosinophils Absolute: 0 10*3/uL (ref 0.0–0.5)
Eosinophils Relative: 1 %
HCT: 27.1 % — ABNORMAL LOW (ref 39.0–52.0)
Hemoglobin: 8.8 g/dL — ABNORMAL LOW (ref 13.0–17.0)
Immature Granulocytes: 0 %
Lymphocytes Relative: 25 %
Lymphs Abs: 1.3 10*3/uL (ref 0.7–4.0)
MCH: 29 pg (ref 26.0–34.0)
MCHC: 32.5 g/dL (ref 30.0–36.0)
MCV: 89.4 fL (ref 80.0–100.0)
Monocytes Absolute: 0.6 10*3/uL (ref 0.1–1.0)
Monocytes Relative: 11 %
Neutro Abs: 3.2 10*3/uL (ref 1.7–7.7)
Neutrophils Relative %: 63 %
Platelets: 315 10*3/uL (ref 150–400)
RBC: 3.03 MIL/uL — ABNORMAL LOW (ref 4.22–5.81)
RDW: 13.4 % (ref 11.5–15.5)
WBC: 5 10*3/uL (ref 4.0–10.5)
nRBC: 0 % (ref 0.0–0.2)

## 2021-12-15 LAB — BASIC METABOLIC PANEL
Anion gap: 6 (ref 5–15)
BUN: 10 mg/dL (ref 8–23)
CO2: 29 mmol/L (ref 22–32)
Calcium: 8.4 mg/dL — ABNORMAL LOW (ref 8.9–10.3)
Chloride: 99 mmol/L (ref 98–111)
Creatinine, Ser: 0.58 mg/dL — ABNORMAL LOW (ref 0.61–1.24)
GFR, Estimated: >60 mL/min (ref >60)
Glucose, Bld: 289 mg/dL — ABNORMAL HIGH (ref 70–99)
Potassium: 4 mmol/L (ref 3.5–5.1)
Sodium: 134 mmol/L — ABNORMAL LOW (ref 135–145)

## 2021-12-15 LAB — GLUCOSE, CAPILLARY
Glucose-Capillary: 280 mg/dL — ABNORMAL HIGH (ref 70–99)
Glucose-Capillary: 76 mg/dL (ref 70–99)
Glucose-Capillary: 205 mg/dL — ABNORMAL HIGH (ref 70–99)
Glucose-Capillary: 110 mg/dL — ABNORMAL HIGH (ref 70–99)

## 2021-12-15 LAB — MAGNESIUM: Magnesium: 1.7 mg/dL (ref 1.7–2.4)

## 2021-12-15 LAB — PHOSPHORUS: Phosphorus: 3.1 mg/dL (ref 2.5–4.6)

## 2021-12-15 MED ORDER — FUROSEMIDE 10 MG/ML IJ SOLN
40.0000 mg | Freq: Once | INTRAMUSCULAR | Status: AC
  Administered 2021-12-15: 40 mg via INTRAVENOUS
  Filled 2021-12-15: qty 4

## 2021-12-15 NOTE — TOC Progression Note (Signed)
Transition of Care (TOC) - Progression Note  ? ? ?Patient Details  ?Name: David Strong ?MRN: [id number] ?Date of Birth: 08-10-1956 ? ?Transition of Care (TOC) CM/SW Contact  ?Carley Hammed, LCSWA ?Phone Number: ?12/15/2021, 4:06 PM ? ?Clinical Narrative:    ?CSW was notified that pt now can DC whenever home services can be arranged. HH is unable to have an RN out until Monday, CSW confirmed with [facility], pt is on the list for Monday. CSW spoke with Pam at [facility] who stated they can provide medication on Monday as well. Plan is to DC Sunday after evening Dose. CSW updated medical team. CSW attempted to update wife, but unable to leave a VM. tOC will continue to follow for DC needs. ? ? ?Expected Discharge Plan: Home/Self Care ?Barriers to Discharge: Barriers Resolved ? ?Expected Discharge Plan and Services ?Expected Discharge Plan: Home/Self Care ?  ?Discharge Planning Services: CM Consult ?Post Acute Care Choice: NA ?Living arrangements for the past 2 months: Single Family Home ?                ?  ?  ?  ?  ?  ?  ?  ?  ?  ?  ? ? ?Social Determinants of Health (SDOH) Interventions ?  ? ?Readmission Risk Interventions ?   ? View : No data to display.  ?  ?  ?  ? ? ?

## 2021-12-15 NOTE — Progress Notes (Deleted)
RN contacted by [facility] that near the end of their walk, pt started having what appeared to be PACs. General review of telemetry shows occasional bigeminy PACs/PVCs starting around 1312. This RN requested telemetry put in a save strip.  ? ?MD notified via secure page. Awaiting callback.  ?

## 2021-12-15 NOTE — Progress Notes (Signed)
Inpatient Diabetes Program Recommendations ? ?AACE/ADA: New Consensus Statement on Inpatient Glycemic Control (2015) ? ?Target Ranges:  Prepandial:   less than 140 mg/dL ?     Peak postprandial:   less than 180 mg/dL (1-2 hours) ?     Critically ill patients:  140 - 180 mg/dL  ? ?Lab Results  ?Component Value Date  ? GLUCAP 76 12/15/2021  ? HGBA1C 8.6 (H) 11/23/2021  ? ? ?Review of Glycemic Control ? Latest Reference Range & Units 12/14/21 21:21 12/15/21 06:17 12/15/21 11:41  ?Glucose-Capillary 70 - 99 mg/dL 081 (H) 448 (H) 76  ?(H): Data is abnormally high ?Diabetes history: Type 2 DM ?Outpatient Diabetes medications: Glipizide 10 mg BID,  ?Current orders for Inpatient glycemic control: Novolog 0-15 units TD, Novolog 0-5 units QHs, Novolog 8 units TID, Semglee 35 units QD ? ?Inpatient Diabetes Program Recommendations:   ? ?Consider Semglee to 38 units QD.  ? ?Thanks, ?Lujean Rave, MSN, RNC-OB ?Diabetes Coordinator ?[phone] (8a-5p) ? ? ?

## 2021-12-15 NOTE — Plan of Care (Signed)

## 2021-12-15 NOTE — Progress Notes (Signed)
Per CVTS - okay to DC patient r/t tolerance of wound dressing changes with pain mitigated by oral pain meds.  ? ?Per Caleb Popp MD - Okay to DC today, if home health is set up.  ? ?Samson Frederic and [facility] contacted with this information.  ?

## 2021-12-15 NOTE — Progress Notes (Signed)
? ?   ?  [address] ?      [name] [address] ?            [phone]   ? ?  ?7 Days Post-Op Procedure(s) (LRB): ?TRANSESOPHAGEAL ECHOCARDIOGRAM (TEE) (N/A) ? ?Subjective: ? ?Patient w/o complaints.   Tolerated bedside wound vac change with oral pain medication yesterday ? ?Objective: ?Vital signs in last 24 hours: ?Temp:  [98 ?F (36.7 ?C)-98.7 ?F (37.1 ?C)] 98 ?F (36.7 ?C) (04/28 0757) ?Pulse Rate:  [91-102] 91 (04/28 0757) ?Cardiac Rhythm: Normal sinus rhythm (04/27 2010) ?Resp:  [10-19] 13 (04/28 0757) ?BP: (94-129)/(49-85) 110/72 (04/28 0757) ?SpO2:  [95 %-99 %] 96 % (04/28 0757) ?Weight:  [75.3 kg] 75.3 kg (04/28 0404) ? ?Intake/Output from previous day: ?04/27 0701 - 04/28 0700 ?In: 360 [P.O.:360] ?Out: 1300 [Urine:1250; Drains:50] ? ?General appearance: alert, cooperative, and no distress ?Heart: regular rate and rhythm ?Lungs: clear to auscultation bilaterally ?Wound: wound vac in place ? ?Lab Results: ?Recent Labs  ?  12/14/21 ?0459 12/15/21 ?0413  ?WBC 4.8 5.0  ?HGB 8.6* 8.8*  ?HCT 26.1* 27.1*  ?PLT 303 315  ? ?BMET:  ?Recent Labs  ?  12/14/21 ?1017 12/15/21 ?0413  ?NA 135 134*  ?K 3.8 4.0  ?CL 99 99  ?CO2 30 29  ?GLUCOSE 149* 289*  ?BUN 6* 10  ?CREATININE 0.58* 0.58*  ?CALCIUM 8.1* 8.4*  ?  ?PT/INR: No results for input(s): LABPROT, INR in the last 72 hours. ?ABG ?No results found for: PHART, HCO3, TCO2, ACIDBASEDEF, O2SAT ?CBG (last 3)  ?Recent Labs  ?  12/14/21 ?1625 12/14/21 ?2121 12/15/21 ?0617  ?GLUCAP 138* 171* 280*  ? ? ?Assessment/Plan: ?S/P Procedure(s) (LRB): ?TRANSESOPHAGEAL ECHOCARDIOGRAM (TEE) (N/A) ? ?Alpha Joint Osteomyelitis- S/P ID in OR, now with wound vac in place, tolerating bed side changes with oral medications ?ID-OR cultures are negative, will require 6 weeks of IV ABX per ID recommendations ?DIspo- patient stable, okay to d/c from cardiothoracic surgery standpoint for wound vac changes at home.  We will see him in our office in 2 weeks for wound check ? ? LOS: 23  days  ? ?Lowella Dandy, PA-C ?12/15/2021 ? ? ?

## 2021-12-15 NOTE — TOC Progression Note (Addendum)
Transition of Care (TOC) - Progression Note  ? ? ?Patient Details  ?Name: David Strong ?MRN: [id number] ?Date of Birth: 01-Jan-1956 ? ?Transition of Care (TOC) CM/SW Contact  ?Beckie Busing, RN ?Phone Number:[phone] ? ?12/15/2021, 9:43 AM ? ?Clinical Narrative:    ?CM received message from [facility] with [facility] for home infusion to determine when patient will be discharged. If expected weekend discharge IV antibiotics will need to be prepared. Message has been sent to MD.  ? ?1200 Per MD CT surgery said he will need to be here until Monday so no weekend discharge. TOC will continue to follow. ? ? ?Expected Discharge Plan: Home/Self Care ?Barriers to Discharge: Barriers Resolved ? ?Expected Discharge Plan and Services ?Expected Discharge Plan: Home/Self Care ?  ?Discharge Planning Services: CM Consult ?Post Acute Care Choice: NA ?Living arrangements for the past 2 months: Single Family Home ?                ?  ?  ?  ?  ?  ?  ?  ?  ?  ?  ? ? ?Social Determinants of Health (SDOH) Interventions ?  ? ?Readmission Risk Interventions ?   ? View : No data to display.  ?  ?  ?  ? ? ?

## 2021-12-15 NOTE — Progress Notes (Signed)
? ?PROGRESS NOTE ? ? ? ?David Strong  YDX:[id number] DOB: Jan 14, 1956 DOA: 11/22/2021 ?PCP: [name], MD ? ? ?Brief Narrative: ? ?David Strong is a 66 y.o. male with a history of diabetes mellitus type 2.  Patient presented secondary to productive cough, fever, chills, myalgias and arthralgias.  Patient was found to have multifocal pneumonia.  During work-up, patient developed evidence of severe sepsis and was found to have streptococcal bacteremia.  Patient was managed on IV antibiotics with development of septic arthritis of his left sternoclavicular joint.  Cardiothoracic surgery was consulted and performed I&D with wound VAC placement for treatment.  Endocarditis work-up was negative.  Patient with plan for 6 weeks of IV antibiotics total.  ? ?Assessment and Plan: ? ?Severe sepsis ?Does not appear to have been present on admission per H&P. Secondary to streptococcal infection as mentioned below. Patient empirically treated with Vancomycin, Cefepime and Azithromycin and transitioned to Ceftriaxone and finally to Penicillin G. Sepsis now resolved. ? ?Streptococcal multifocal pneumonia ?Streptococcal bacteremia ?ID consulted with recommendations for 6 week total of IV antibiotics. Patient currently on Penicillin G BID. PICC line in place for outpatient antibiotics. Transthoracic Echocardiogram and Transesophageal Echocardiogram negative for valvular vegetations. ?-Continue Penicillin IV ? ?Septic left sternoclavicular joint ?Septic arthritis ?Secondary to above infections. Patient underwent Left supraclavicular joint I&D of left sternoclavicular joint abscess and subsequent placement of a wound vac, per cardiothoracic surgery. ?-Cardiothoracic surgery recommendations: continue wound vac ?-Antibiotics as mentioned above ? ?Polyarthralgia ?Related to infection and possible inflammatory changes to joints/cartilage ? ?Left knee abscesses ?Noted on MRI dated 4/22 multiple abscesses noted. Orthopedic surgery  consulted and recommend against I&D of abscesses at this time, and rather recommend continued antibiotic therapy. ? ?Right shoulder myositis ?MRI performed on 4/11. Right shoulder with initial concern for septic joint. Orthopedic surgery consulted and performed aspiration which was dry. Per orthopedic, presentation consistent with myositis. ? ?DVT ?Acute left LE involving left gastrocnemius and left posterior tibial veins. Age indeterminate DVT of left popliteal vein. Hematology consulted and attributed thromboses to sepsis. Patient initially managed on Heparin IV and has been transitioned to Eliquis. ? ?Anasarca ?Likely related to poor albumin. Patient started on Lasix PO and now transitioned to Lasix IV. Weight down to 75.3 kg today ?-Lasix 40 mg IV once more ? ?Elevated AST/ALT/alkaline phosphatase ?Mild. Asymptomatic. In setting of sepsis. GI consulted and have signed off. ? ?High anion gap metabolic acidosis ?Lactic acidosis ?Secondary to acute illness/sepsis. Resolved with treatment. ? ?Thrombocytopenia ?Present on admission with resolution. Possibly related to acute illness/sepsis. ? ?Diabetes mellitus, type 2 ?Hemoglobin A1C of 8.6 on 11/23/21. Patient is on Farxiga and Glipizide XL as an outpatient. Patient started on [place], Novolog for meal coverage and SSI this admission. ?-Continue Semglee 35 units daily, Novolog 8 units TID with meals and SSI ? ?Hypokalemia ?Hypomagnesemia ?Hypophosphatemia ?Supplementation given with resolution. ? ?Constipation ?Resolved with bowel regimen. ? ?Hyponatremia ?Resolved. ? ?Acute blood loss anemia ?Baseline hemoglobin around 14-15. Downward drift, likely secondary to perioperative blood loss. Hemoglobin currently stable at 8.6 - 9. ? ?Increased nutrient needs ?Secondary to acute illness. Albumin undetectable. Registered dietitian consulted. ?Dietitian recommendations (4/26): ?Continue Ensure Max po BID, each supplement provides 150 kcal and 30 grams of protein ?Continue  double protein portions TID with meals ?Continue MVI with minerals daily ?Encourage PO intake ? ? ?DVT prophylaxis: Eliquis ?Code Status:   Code Status: Full Code ?Family Communication: None at bedside ?Disposition Plan: Discharge home when home health is set up. CT  surgery states he is clear to discharge home at this time. ? ? ?Consultants:  ?Cardiothoracic surgery ?Infectious disease ?Orthopedic surgery ?Gastroenterology ?Hematology/oncology ? ?Procedures:  ?Incision and drainage of left sternoclavicular joint abscess with excisional debridement and pulse lavage (4/17) ?Wound VAC change and excisional debridement of left sternoclavicular joint abscess (4/20) ? ?Antimicrobials: ?Vancomycin ?Pencillin G  ?Ceftriaxone ?Cefepime ? ? ?Subjective: ?Swelling continues to improve. No other concerns. ? ?Objective: ?BP 117/78 (BP Location: Right Arm)   Pulse 91   Temp 98 ?F (36.7 ?C) (Oral)   Resp 13   Ht 5\' 4"  (1.626 m)   Wt 75.3 kg   SpO2 96%   BMI 28.49 kg/m?  ? ?Examination: ? ?General exam: Appears calm and comfortable ?Respiratory system: Clear to auscultation. Respiratory effort normal. ?Cardiovascular system: S1 & S2 heard, Normal rate with regular rhythm. ?Gastrointestinal system: Abdomen is nondistended, soft and nontender. Normal bowel sounds heard. ?Central nervous system: Alert and oriented. No focal neurological deficits. ?Musculoskeletal: BLE pitting edema of dorsal feet. No calf tenderness ?Skin: No cyanosis. No rashes ?Psychiatry: Judgement and insight appear normal. Mood & affect appropriate.   ? ? ?Data Reviewed: I have personally reviewed following labs and imaging studies ? ?CBC ?Lab Results  ?Component Value Date  ? WBC 5.0 12/15/2021  ? RBC 3.03 (L) 12/15/2021  ? HGB 8.8 (L) 12/15/2021  ? HCT 27.1 (L) 12/15/2021  ? MCV 89.4 12/15/2021  ? MCH 29.0 12/15/2021  ? PLT 315 12/15/2021  ? MCHC 32.5 12/15/2021  ? RDW 13.4 12/15/2021  ? LYMPHSABS 1.3 12/15/2021  ? MONOABS 0.6 12/15/2021  ? EOSABS 0.0  12/15/2021  ? BASOSABS 0.0 12/15/2021  ? ? ? ?Last metabolic panel ?Lab Results  ?Component Value Date  ? NA 134 (L) 12/15/2021  ? K 4.0 12/15/2021  ? CL 99 12/15/2021  ? CO2 29 12/15/2021  ? BUN 10 12/15/2021  ? CREATININE 0.58 (L) 12/15/2021  ? GLUCOSE 289 (H) 12/15/2021  ? GFRNONAA >60 12/15/2021  ? GFRAA 87 04/07/2020  ? CALCIUM 8.4 (L) 12/15/2021  ? PHOS 3.1 12/15/2021  ? PROT 5.5 (L) 12/11/2021  ? ALBUMIN <1.5 (L) 12/11/2021  ? LABGLOB 2.3 04/07/2020  ? AGRATIO 2.0 04/07/2020  ? BILITOT 0.6 12/11/2021  ? ALKPHOS 152 (H) 12/11/2021  ? AST 19 12/11/2021  ? ALT 20 12/11/2021  ? ANIONGAP 6 12/15/2021  ? ? ?GFR: ?Estimated Creatinine Clearance: 85.4 mL/min (A) (by C-G formula based on SCr of 0.58 mg/dL (L)). ? ?Recent Results (from the past 240 hour(s))  ?Aerobic/Anaerobic Culture w Gram Stain (surgical/deep wound)     Status: None  ? Collection Time: 12/07/21 12:26 PM  ? Specimen: PATH Other; Tissue  ?Result Value Ref Range Status  ? Specimen Description ASCITIC  Final  ? Special Requests STERNOVACULIAR JOINT WOUND SPEC A  Final  ? Gram Stain NO WBC SEEN ?NO ORGANISMS SEEN ?  Final  ? Culture   Final  ?  No growth aerobically or anaerobically. ?Performed at [facility], [address], [place], [place] ?  ? Report Status 12/12/2021 FINAL  Final  ?  ? ? ?Radiology Studies: ?No results found. ? ? ? LOS: 23 days  ? ? ?12/14/2021, MD ?[facility] ?12/15/2021, 2:13 PM ? ? ?If 7PM-7AM, please contact night-coverage [email or web address] ? ?

## 2021-12-15 NOTE — Progress Notes (Signed)
?  Mobility Specialist Criteria Algorithm Info. ? ? 12/15/21 1205  ?Mobility  ?Activity Ambulated with assistance in hallway;Transferred from bed to chair ?(to chair after ambulation)  ?Range of Motion/Exercises Active;All extremities  ?Level of Assistance Standby assist, set-up cues, supervision of patient - no hands on  ?Assistive Device Front wheel walker  ?LUE Weight Bearing WBAT  ?Distance Ambulated (ft) 200 ft  ?Activity Response Tolerated well  ? Patient received dangling EOB eager to participate in mobility. Ambulated in hallway with slow steady gait. Tolerated without complaint or incident. Was left in recliner chair with all needs met call bell in reach. ? ?12/15/2021 ?1:18 PM ? ?[place] Khasir Woodrome, CMS, BS EXP ?Acute Rehabilitation Services  ?YXAJL:[phone] ?Office: [phone] ? ?

## 2021-12-16 DIAGNOSIS — A419 Sepsis, unspecified organism: Secondary | ICD-10-CM | POA: Diagnosis not present

## 2021-12-16 DIAGNOSIS — J189 Pneumonia, unspecified organism: Secondary | ICD-10-CM | POA: Diagnosis not present

## 2021-12-16 DIAGNOSIS — R600 Localized edema: Secondary | ICD-10-CM | POA: Diagnosis not present

## 2021-12-16 DIAGNOSIS — I82402 Acute embolism and thrombosis of unspecified deep veins of left lower extremity: Secondary | ICD-10-CM | POA: Diagnosis not present

## 2021-12-16 LAB — GLUCOSE, CAPILLARY
Glucose-Capillary: 174 mg/dL — ABNORMAL HIGH (ref 70–99)
Glucose-Capillary: 111 mg/dL — ABNORMAL HIGH (ref 70–99)
Glucose-Capillary: 250 mg/dL — ABNORMAL HIGH (ref 70–99)
Glucose-Capillary: 142 mg/dL — ABNORMAL HIGH (ref 70–99)

## 2021-12-16 MED ORDER — FUROSEMIDE 10 MG/ML IJ SOLN
40.0000 mg | Freq: Once | INTRAMUSCULAR | Status: AC
  Administered 2021-12-16: 40 mg via INTRAVENOUS
  Filled 2021-12-16: qty 4

## 2021-12-16 NOTE — Plan of Care (Signed)

## 2021-12-16 NOTE — Progress Notes (Signed)
? ?PROGRESS NOTE ? ? ? ?David Strong  OQH:[id number] DOB: 01-Aug-1956 DOA: 11/22/2021 ?PCP: [name], MD ? ? ?Brief Narrative: ? ?David Strong is a 66 y.o. male with a history of diabetes mellitus type 2.  Patient presented secondary to productive cough, fever, chills, myalgias and arthralgias.  Patient was found to have multifocal pneumonia.  During work-up, patient developed evidence of severe sepsis and was found to have streptococcal bacteremia.  Patient was managed on IV antibiotics with development of septic arthritis of his left sternoclavicular joint.  Cardiothoracic surgery was consulted and performed I&D with wound VAC placement for treatment.  Endocarditis work-up was negative.  Patient with plan for 6 weeks of IV antibiotics total.  ? ?Assessment and Plan: ? ?Severe sepsis ?Does not appear to have been present on admission per H&P. Secondary to streptococcal infection as mentioned below. Patient empirically treated with Vancomycin, Cefepime and Azithromycin and transitioned to Ceftriaxone and finally to Penicillin G. Sepsis now resolved. ? ?Streptococcal multifocal pneumonia ?Streptococcal bacteremia ?ID consulted with recommendations for 6 week total of IV antibiotics. Patient currently on Penicillin G BID. PICC line in place for outpatient antibiotics. Transthoracic Echocardiogram and Transesophageal Echocardiogram negative for valvular vegetations. ?-Continue Penicillin IV ?-Patient to discharge on Ceftriaxone 2g IV daily for outpatient regimen ? ?Septic left sternoclavicular joint ?Septic arthritis ?Secondary to above infections. Patient underwent Left supraclavicular joint I&D of left sternoclavicular joint abscess and subsequent placement of a wound vac, per cardiothoracic surgery. ?-Cardiothoracic surgery recommendations: continue wound vac ?-Antibiotics as mentioned above ? ?Polyarthralgia ?Related to infection and possible inflammatory changes to joints/cartilage ? ?Left knee  abscesses ?Noted on MRI dated 4/22 multiple abscesses noted. Orthopedic surgery consulted and recommend against I&D of abscesses at this time, and rather recommend continued antibiotic therapy. ? ?Right shoulder myositis ?MRI performed on 4/11. Right shoulder with initial concern for septic joint. Orthopedic surgery consulted and performed aspiration which was dry. Per orthopedic, presentation consistent with myositis. ? ?DVT ?Acute left LE involving left gastrocnemius and left posterior tibial veins. Age indeterminate DVT of left popliteal vein. Hematology consulted and attributed thromboses to sepsis. Patient initially managed on Heparin IV and has been transitioned to Eliquis. ? ?Anasarca ?Likely related to poor albumin. Patient started on Lasix PO and now transitioned to Lasix IV. Weight down to 75.3 kg today ?-Lasix 40 mg IV ? ?Elevated AST/ALT/alkaline phosphatase ?Mild. Asymptomatic. In setting of sepsis. GI consulted and have signed off. ? ?High anion gap metabolic acidosis ?Lactic acidosis ?Secondary to acute illness/sepsis. Resolved with treatment. ? ?Thrombocytopenia ?Present on admission with resolution. Possibly related to acute illness/sepsis. ? ?Diabetes mellitus, type 2 ?Hemoglobin A1C of 8.6 on 11/23/21. Patient is on Farxiga and Glipizide XL as an outpatient. Patient started on [place], Novolog for meal coverage and SSI this admission. ?-Continue Semglee 35 units daily, Novolog 8 units TID with meals and SSI ? ?Hypokalemia ?Hypomagnesemia ?Hypophosphatemia ?Supplementation given with resolution. ? ?Constipation ?Resolved with bowel regimen. ? ?Hyponatremia ?Resolved. ? ?Acute blood loss anemia ?Baseline hemoglobin around 14-15. Downward drift, likely secondary to perioperative blood loss. Hemoglobin currently stable at 8.6 - 9. ? ?Increased nutrient needs ?Secondary to acute illness. Albumin undetectable. Registered dietitian consulted. ?Dietitian recommendations (4/26): ?Continue Ensure Max po BID,  each supplement provides 150 kcal and 30 grams of protein ?Continue double protein portions TID with meals ?Continue MVI with minerals daily ?Encourage PO intake ? ? ?DVT prophylaxis: Eliquis ?Code Status:   Code Status: Full Code ?Family Communication: None at bedside ?Disposition Plan:  Discharge home when home health is set up. CT surgery states he is clear to discharge home at this time. Discharge on 5/1 ? ? ?Consultants:  ?Cardiothoracic surgery ?Infectious disease ?Orthopedic surgery ?Gastroenterology ?Hematology/oncology ? ?Procedures:  ?Incision and drainage of left sternoclavicular joint abscess with excisional debridement and pulse lavage (4/17) ?Wound VAC change and excisional debridement of left sternoclavicular joint abscess (4/20) ? ?Antimicrobials: ?Vancomycin ?Pencillin G  ?Ceftriaxone ?Cefepime ? ? ?Subjective: ?Swelling improving. No other concerns. ? ?Objective: ?BP 124/80 (BP Location: Right Arm)   Pulse 88   Temp 98 ?F (36.7 ?C) (Oral)   Resp 14   Ht 5\' 4"  (1.626 m)   Wt 73.5 kg   SpO2 96%   BMI 27.81 kg/m?  ? ?Examination: ? ?General exam: Appears calm and comfortable ?Respiratory system: Clear to auscultation. Respiratory effort normal. ?Cardiovascular system: S1 & S2 heard, RRR. No murmurs. ?Gastrointestinal system: Abdomen is soft and nontender. Normal bowel sounds heard. ?Central nervous system: Alert and oriented. No focal neurological deficits. ?Musculoskeletal: Dorsal pedal edema. No calf tenderness ?Psychiatry: Judgement and insight appear normal. Mood & affect appropriate.    ? ? ?Data Reviewed: I have personally reviewed following labs and imaging studies ? ?CBC ?Lab Results  ?Component Value Date  ? WBC 5.0 12/15/2021  ? RBC 3.03 (L) 12/15/2021  ? HGB 8.8 (L) 12/15/2021  ? HCT 27.1 (L) 12/15/2021  ? MCV 89.4 12/15/2021  ? MCH 29.0 12/15/2021  ? PLT 315 12/15/2021  ? MCHC 32.5 12/15/2021  ? RDW 13.4 12/15/2021  ? LYMPHSABS 1.3 12/15/2021  ? MONOABS 0.6 12/15/2021  ? EOSABS 0.0  12/15/2021  ? BASOSABS 0.0 12/15/2021  ? ? ? ?Last metabolic panel ?Lab Results  ?Component Value Date  ? NA 134 (L) 12/15/2021  ? K 4.0 12/15/2021  ? CL 99 12/15/2021  ? CO2 29 12/15/2021  ? BUN 10 12/15/2021  ? CREATININE 0.58 (L) 12/15/2021  ? GLUCOSE 289 (H) 12/15/2021  ? GFRNONAA >60 12/15/2021  ? GFRAA 87 04/07/2020  ? CALCIUM 8.4 (L) 12/15/2021  ? PHOS 3.1 12/15/2021  ? PROT 5.5 (L) 12/11/2021  ? ALBUMIN <1.5 (L) 12/11/2021  ? LABGLOB 2.3 04/07/2020  ? AGRATIO 2.0 04/07/2020  ? BILITOT 0.6 12/11/2021  ? ALKPHOS 152 (H) 12/11/2021  ? AST 19 12/11/2021  ? ALT 20 12/11/2021  ? ANIONGAP 6 12/15/2021  ? ? ?GFR: ?Estimated Creatinine Clearance: 84.5 mL/min (A) (by C-G formula based on SCr of 0.58 mg/dL (L)). ? ?Recent Results (from the past 240 hour(s))  ?Aerobic/Anaerobic Culture w Gram Stain (surgical/deep wound)     Status: None  ? Collection Time: 12/07/21 12:26 PM  ? Specimen: PATH Other; Tissue  ?Result Value Ref Range Status  ? Specimen Description ASCITIC  Final  ? Special Requests STERNOVACULIAR JOINT WOUND SPEC A  Final  ? Gram Stain NO WBC SEEN ?NO ORGANISMS SEEN ?  Final  ? Culture   Final  ?  No growth aerobically or anaerobically. ?Performed at [facility], [address], [place], [place] ?  ? Report Status 12/12/2021 FINAL  Final  ?  ? ? ?Radiology Studies: ?No results found. ? ? ? LOS: 24 days  ? ? ?12/14/2021, MD ?[facility] ?12/16/2021, 10:18 AM ? ? ?If 7PM-7AM, please contact night-coverage [email or web address] ? ?

## 2021-12-16 NOTE — Progress Notes (Signed)
Student RN called RN into the room during her med pass. Pt adamant that his blood sugar needed to be tested prior to receiving long acting insulin. Pt also adamant that he just took several pills and needs to wait to take long acting insulin so it doesn't drop his sugars. RN reassured patient that none of the oral medications that he just took would affect his blood sugar and that long acting insulin is not dosed based on his instantaneous CBG reading, nor would it immediately be absorbed by his body and drop his blood sugar. Pt questioned who provider was that ordered medications and the timing of their administration. RN stated Dr Lonny Prude, who rounded on his this a.m. Pt questioned why orders from [facility] were not being followed. RN educated patient that the most recent and up to date provider was the one who rounded on him this morning, and the patient's medications are administered by Rns per the MD's orders. Pt kept repeating "Well you're the nurse and I trust you, BUT....". While patient appears to be quite literate, his understanding of medications and their mode of action do not appear to be accurate. RN asked pt if he needed the MD to come back and explain to him the rationale of the long acting versus short acting insulins. Patient declined. Pt amicable to long acting insulin at this time. Student RN reassured that she could proceed with administration of medication.  ?

## 2021-12-16 NOTE — Progress Notes (Signed)
?  Mobility Specialist Criteria Algorithm Info. ? ? ? 12/16/21 1540  ?Mobility  ?Activity Ambulated with assistance in hallway;Dangled on edge of bed  ?Range of Motion/Exercises Active;All extremities  ?Level of Assistance Standby assist, set-up cues, supervision of patient - no hands on  ?Assistive Device Front wheel walker  ?LUE Weight Bearing WBAT  ?Distance Ambulated (ft) 210 ft  ?Activity Response Tolerated well  ? ?Patient ambulated in hallway with slow steady gait. Returned to room without complaint or incident. Was left dangling EOB with all needs met. ? ?12/16/2021 ?5:15 PM ? ?[place] Eleshia Wooley, CMS, BS EXP ?Acute Rehabilitation Services  ?[phone] ?Office: [phone] ? ?

## 2021-12-16 NOTE — Plan of Care (Signed)

## 2021-12-17 DIAGNOSIS — A419 Sepsis, unspecified organism: Secondary | ICD-10-CM | POA: Diagnosis not present

## 2021-12-17 DIAGNOSIS — R600 Localized edema: Secondary | ICD-10-CM | POA: Diagnosis not present

## 2021-12-17 DIAGNOSIS — J189 Pneumonia, unspecified organism: Secondary | ICD-10-CM | POA: Diagnosis not present

## 2021-12-17 DIAGNOSIS — I82402 Acute embolism and thrombosis of unspecified deep veins of left lower extremity: Secondary | ICD-10-CM | POA: Diagnosis not present

## 2021-12-17 LAB — GLUCOSE, CAPILLARY
Glucose-Capillary: 177 mg/dL — ABNORMAL HIGH (ref 70–99)
Glucose-Capillary: 167 mg/dL — ABNORMAL HIGH (ref 70–99)
Glucose-Capillary: 276 mg/dL — ABNORMAL HIGH (ref 70–99)
Glucose-Capillary: 148 mg/dL — ABNORMAL HIGH (ref 70–99)

## 2021-12-17 NOTE — Progress Notes (Signed)
?  Mobility Specialist Criteria Algorithm Info. ? ? 12/17/21 1520  ?Mobility  ?Activity Ambulated with assistance in hallway  ?Range of Motion/Exercises Active;All extremities  ?Level of Assistance Standby assist, set-up cues, supervision of patient - no hands on  ?Assistive Device Front wheel walker  ?LUE Weight Bearing WBAT  ?Distance Ambulated (ft) 200 ft  ?Activity Response Tolerated well  ? ?Patient ambulated in hallway with supervision. Tolerated well without complaint or incident. Was left with all needs met call bell in reach. ? ?12/17/2021 ?3:40 PM ? ?[place] Yoshi Mancillas, CMS, BS EXP ?Acute Rehabilitation Services  ?[phone] ?Office: [phone] ? ?

## 2021-12-17 NOTE — Progress Notes (Signed)
? ?PROGRESS NOTE ? ? ? ?Sahid Borba  KZL:[id number] DOB: [birth year] DOA: 11/22/2021 ?PCP: [name], MD ? ? ?Brief Narrative: ? ?Zylan Almquist is a 66 y.o. male with a history of diabetes mellitus type 2.  Patient presented secondary to productive cough, fever, chills, myalgias and arthralgias.  Patient was found to have multifocal pneumonia.  During work-up, patient developed evidence of severe sepsis and was found to have streptococcal bacteremia.  Patient was managed on IV antibiotics with development of septic arthritis of his left sternoclavicular joint.  Cardiothoracic surgery was consulted and performed I&D with wound VAC placement for treatment.  Endocarditis work-up was negative.  Patient with plan for 6 weeks of IV antibiotics total.  ? ?Assessment and Plan: ? ?Severe sepsis ?Does not appear to have been present on admission per H&P. Secondary to streptococcal infection as mentioned below. Patient empirically treated with Vancomycin, Cefepime and Azithromycin and transitioned to Ceftriaxone and finally to Penicillin G. Sepsis now resolved. ? ?Streptococcal multifocal pneumonia ?Streptococcal bacteremia ?ID consulted with recommendations for 6 week total of IV antibiotics. Patient currently on Penicillin G BID. PICC line in place for outpatient antibiotics. Transthoracic Echocardiogram and Transesophageal Echocardiogram negative for valvular vegetations. ?-Continue Penicillin IV ?-Patient to discharge on Ceftriaxone 2g IV daily for outpatient regimen ? ?Septic left sternoclavicular joint ?Septic arthritis ?Secondary to above infections. Patient underwent Left supraclavicular joint I&D of left sternoclavicular joint abscess and subsequent placement of a wound vac, per cardiothoracic surgery. ?-Cardiothoracic surgery recommendations: continue wound vac ?-Antibiotics as mentioned above ? ?Polyarthralgia ?Related to infection and possible inflammatory changes to joints/cartilage ? ?Left knee  abscesses ?Noted on MRI dated 4/22 multiple abscesses noted. Orthopedic surgery consulted and recommend against I&D of abscesses at this time, and rather recommend continued antibiotic therapy. ? ?Right shoulder myositis ?MRI performed on 4/11. Right shoulder with initial concern for septic joint. Orthopedic surgery consulted and performed aspiration which was dry. Per orthopedic, presentation consistent with myositis. ? ?DVT ?Acute left LE involving left gastrocnemius and left posterior tibial veins. Age indeterminate DVT of left popliteal vein. Hematology consulted and attributed thromboses to sepsis. Patient initially managed on Heparin IV and has been transitioned to Eliquis. ? ?Anasarca ?Likely related to poor albumin. Patient started on Lasix PO and now transitioned to Lasix IV. Weight down to 75.3 kg on 4/29 ? ?Elevated AST/ALT/alkaline phosphatase ?Mild. Asymptomatic. In setting of sepsis. GI consulted and have signed off. ? ?High anion gap metabolic acidosis ?Lactic acidosis ?Secondary to acute illness/sepsis. Resolved with treatment. ? ?Thrombocytopenia ?Present on admission with resolution. Possibly related to acute illness/sepsis. ? ?Diabetes mellitus, type 2 ?Hemoglobin A1C of 8.6 on 11/23/21. Patient is on Farxiga and Glipizide XL as an outpatient. Patient started on [place], Novolog for meal coverage and SSI this admission. ?-Continue Semglee 35 units daily, Novolog 8 units TID with meals and SSI ? ?Hypokalemia ?Hypomagnesemia ?Hypophosphatemia ?Supplementation given with resolution. ? ?Constipation ?Resolved with bowel regimen. ? ?Hyponatremia ?Resolved. ? ?Acute blood loss anemia ?Baseline hemoglobin around 14-15. Downward drift, likely secondary to perioperative blood loss. Hemoglobin currently stable at 8.6 - 9. ? ?Increased nutrient needs ?Secondary to acute illness. Albumin undetectable. Registered dietitian consulted. ?Dietitian recommendations (4/26): ?Continue Ensure Max po BID, each supplement  provides 150 kcal and 30 grams of protein ?Continue double protein portions TID with meals ?Continue MVI with minerals daily ?Encourage PO intake ? ? ?DVT prophylaxis: Eliquis ?Code Status:   Code Status: Full Code ?Family Communication: None at bedside ?Disposition Plan: Discharge home when  home health is set up. CT surgery states he is clear to discharge home at this time. Discharge on 5/1 ? ? ?Consultants:  ?Cardiothoracic surgery ?Infectious disease ?Orthopedic surgery ?Gastroenterology ?Hematology/oncology ? ?Procedures:  ?Incision and drainage of left sternoclavicular joint abscess with excisional debridement and pulse lavage (4/17) ?Wound VAC change and excisional debridement of left sternoclavicular joint abscess (4/20) ? ?Antimicrobials: ?Vancomycin ?Pencillin G  ?Ceftriaxone ?Cefepime ? ? ?Subjective: ?Swelling improving. No other concerns. ? ?Objective: ?BP 123/80 (BP Location: Right Arm)   Pulse 99   Temp 98.5 ?F (36.9 ?C) (Oral)   Resp 17   Ht 5\' 4"  (1.626 m)   Wt 73.5 kg   SpO2 94%   BMI 27.81 kg/m?  ? ?Examination: ? ?General exam: Appears calm and comfortable ?Respiratory system: Clear to auscultation. Respiratory effort normal. ?Cardiovascular system: S1 & S2 heard, RRR. ?Gastrointestinal system: Abdomen is nondistended, soft and nontender. Normal bowel sounds heard. ?Central nervous system: Alert and oriented. No focal neurological deficits. ?Musculoskeletal: 2+ pitting edema of ankles/feet edema. No calf tenderness ?Skin: No cyanosis. No rashes ?Psychiatry: Judgement and insight appear normal. Mood & affect appropriate.   ? ? ?Data Reviewed: I have personally reviewed following labs and imaging studies ? ?CBC ?Lab Results  ?Component Value Date  ? WBC 5.0 12/15/2021  ? RBC 3.03 (L) 12/15/2021  ? HGB 8.8 (L) 12/15/2021  ? HCT 27.1 (L) 12/15/2021  ? MCV 89.4 12/15/2021  ? MCH 29.0 12/15/2021  ? PLT 315 12/15/2021  ? MCHC 32.5 12/15/2021  ? RDW 13.4 12/15/2021  ? LYMPHSABS 1.3 12/15/2021  ?  MONOABS 0.6 12/15/2021  ? EOSABS 0.0 12/15/2021  ? BASOSABS 0.0 12/15/2021  ? ? ? ?Last metabolic panel ?Lab Results  ?Component Value Date  ? NA 134 (L) 12/15/2021  ? K 4.0 12/15/2021  ? CL 99 12/15/2021  ? CO2 29 12/15/2021  ? BUN 10 12/15/2021  ? CREATININE 0.58 (L) 12/15/2021  ? GLUCOSE 289 (H) 12/15/2021  ? GFRNONAA >60 12/15/2021  ? GFRAA 87 04/07/2020  ? CALCIUM 8.4 (L) 12/15/2021  ? PHOS 3.1 12/15/2021  ? PROT 5.5 (L) 12/11/2021  ? ALBUMIN <1.5 (L) 12/11/2021  ? LABGLOB 2.3 04/07/2020  ? AGRATIO 2.0 04/07/2020  ? BILITOT 0.6 12/11/2021  ? ALKPHOS 152 (H) 12/11/2021  ? AST 19 12/11/2021  ? ALT 20 12/11/2021  ? ANIONGAP 6 12/15/2021  ? ? ?GFR: ?Estimated Creatinine Clearance: 84.5 mL/min (A) (by C-G formula based on SCr of 0.58 mg/dL (L)). ? ?Recent Results (from the past 240 hour(s))  ?Aerobic/Anaerobic Culture w Gram Stain (surgical/deep wound)     Status: None  ? Collection Time: 12/07/21 12:26 PM  ? Specimen: PATH Other; Tissue  ?Result Value Ref Range Status  ? Specimen Description ASCITIC  Final  ? Special Requests STERNOVACULIAR JOINT WOUND SPEC A  Final  ? Gram Stain NO WBC SEEN ?NO ORGANISMS SEEN ?  Final  ? Culture   Final  ?  No growth aerobically or anaerobically. ?Performed at [facility], [address], [place], [place] ?  ? Report Status 12/12/2021 FINAL  Final  ?  ? ? ?Radiology Studies: ?No results found. ? ? ? LOS: 25 days  ? ? ?12/14/2021, MD ?[facility] ?12/17/2021, 9:46 AM ? ? ?If 7PM-7AM, please contact night-coverage ?[email or web address] ? ?

## 2021-12-17 NOTE — Progress Notes (Signed)
Progression note: Case management and SW have IV infusion with HH set up for discharge on Monday 12/18/21 per notes.   ? ?Also per notes, pt will be discharged with wound vac.  Unclear per notes if wound vac equipment and dressing changes have been ordered for discharge. ?

## 2021-12-17 NOTE — Plan of Care (Signed)
°  Problem: Clinical Measurements: °Goal: Ability to maintain clinical measurements within normal limits will improve °Outcome: Progressing °Goal: Will remain free from infection °Outcome: Progressing °Goal: Diagnostic test results will improve °Outcome: Progressing °Goal: Respiratory complications will improve °Outcome: Progressing °  °

## 2021-12-18 ENCOUNTER — Telehealth: Payer: Self-pay | Admitting: Emergency Medicine

## 2021-12-18 DIAGNOSIS — R6520 Severe sepsis without septic shock: Secondary | ICD-10-CM | POA: Diagnosis not present

## 2021-12-18 DIAGNOSIS — R7881 Bacteremia: Secondary | ICD-10-CM | POA: Diagnosis not present

## 2021-12-18 DIAGNOSIS — A419 Sepsis, unspecified organism: Secondary | ICD-10-CM | POA: Diagnosis not present

## 2021-12-18 LAB — GLUCOSE, CAPILLARY
Glucose-Capillary: 231 mg/dL — ABNORMAL HIGH (ref 70–99)
Glucose-Capillary: 150 mg/dL — ABNORMAL HIGH (ref 70–99)
Glucose-Capillary: 187 mg/dL — ABNORMAL HIGH (ref 70–99)

## 2021-12-18 MED ORDER — APIXABAN 5 MG PO TABS: 5.0000 mg | ORAL_TABLET | Freq: Two times a day (BID) | ORAL | 2 refills | Status: DC

## 2021-12-18 MED ORDER — CEFTRIAXONE IV (FOR PTA / DISCHARGE USE ONLY): 2.0000 g | INTRAVENOUS | 0 refills | Status: AC

## 2021-12-18 MED ORDER — SODIUM CHLORIDE 0.9 % IV SOLN
2.0000 g | Freq: Once | INTRAVENOUS | Status: AC
  Administered 2021-12-18: 2 g via INTRAVENOUS
  Filled 2021-12-18 (×2): qty 20

## 2021-12-18 NOTE — Discharge Summary (Signed)
?Physician Discharge Summary ?  ?Patient: David Strong MRN: [id number] DOB: 1956/07/25  ?Admit date:     11/22/2021  ?Discharge date: 12/18/21  ?Discharge Physician: Cordelia Poche, MD  ? ?PCP: Horald Pollen, MD  ? ?Recommendations at discharge:  ? ?Hospital follow-up with PCP ?Follow-up with ID, CT surgery, Hematology/Oncology ?Outpatient IV antibiotics until 01/18/2022 ? ?Discharge Diagnoses: ?Principal Problem: ?  Severe sepsis ([place]) ?Active Problems: ?  Pneumococcal bacteremia ?  CAP (community acquired pneumonia) ?  Septic arthritis ([place]) ?  DVT (deep venous thrombosis) ([place]) ?  High anion gap metabolic acidosis ?  Transaminitis ?  Thrombocytopenia ([place]) ?  Type 2 diabetes mellitus ([place]) ?  Hypokalemia ?  Hypophosphatemia ?  Constipation ?  Septic arthritis of left sternoclavicular joint ([place]) ?  Bilateral leg edema ?  Obese ?  Septic prepatellar bursitis of left knee ?  Pyomyositis ?  Cellulitis ? ?Resolved Problems: ?  * No resolved hospital problems. * ? ?Hospital Course: ?David Strong is a 66 y.o. male with a history of diabetes mellitus type 2.  Patient presented secondary to productive cough, fever, chills, myalgias and arthralgias.  Patient was found to have multifocal pneumonia.  During work-up, patient developed evidence of severe sepsis and was found to have streptococcal bacteremia.  Patient was managed on IV antibiotics with development of septic arthritis of his left sternoclavicular joint.  Cardiothoracic surgery was consulted and performed I&D with wound VAC placement for treatment.  Endocarditis work-up was negative.  Patient with plan for 6 weeks of IV antibiotics total.  ? ?Assessment and Plan: ? ?Severe sepsis ?Does not appear to have been present on admission per H&P. Secondary to streptococcal infection as mentioned below. Patient empirically treated with Vancomycin, Cefepime and Azithromycin and transitioned to Ceftriaxone and finally to Penicillin G. Sepsis now resolved. ?   ?Streptococcal multifocal pneumonia ?Streptococcal bacteremia ?ID consulted with recommendations for 6 week total of IV antibiotics. Patient currently on Penicillin G BID. PICC line in place for outpatient antibiotics. Transthoracic Echocardiogram and Transesophageal Echocardiogram negative for valvular vegetations. Patient transitioned to Ceftriaxone 2g IV daily for outpatient regimen. End date of 01/18/2022. ?  ?Septic left sternoclavicular joint ?Septic arthritis ?Secondary to above infections. Patient underwent Left supraclavicular joint I&D of left sternoclavicular joint abscess and subsequent placement of a wound vac, per cardiothoracic surgery. Patient discharged with wound vac and outpatient follow-up. ?  ?Polyarthralgia ?Related to infection and possible inflammatory changes to joints/cartilage. Improved. ?  ?Left knee abscesses ?Noted on MRI dated 4/22 multiple abscesses noted. Orthopedic surgery consulted and recommend against I&D of abscesses at this time, and rather recommend continued antibiotic therapy. ?  ?Right shoulder myositis ?MRI performed on 4/11. Right shoulder with initial concern for septic joint. Orthopedic surgery consulted and performed aspiration which was dry. Per orthopedic, presentation consistent with myositis. ?  ?DVT ?Acute left LE involving left gastrocnemius and left posterior tibial veins. Age indeterminate DVT of left popliteal vein. Hematology consulted and attributed thromboses to sepsis. Patient initially managed on Heparin IV and has been transitioned to Eliquis. ?  ?Anasarca ?Likely related to poor albumin. Patient started on Lasix PO and now transitioned to Lasix IV. Weight down to 71.6 kg on day of discharge. ?  ?Elevated AST/ALT/alkaline phosphatase ?Mild. Asymptomatic. In setting of sepsis. GI consulted and have signed off. ?  ?High anion gap metabolic acidosis ?Lactic acidosis ?Secondary to acute illness/sepsis. Resolved with treatment. ?  ?Thrombocytopenia ?Present on  admission with resolution. Possibly related to acute illness/sepsis. ?  ?  Diabetes mellitus, type 2 ?Hemoglobin A1C of 8.6 on 11/23/21. Patient is on 70/30, Farxiga and Glipizide XL as an outpatient. Patient started on [place], Novolog for meal coverage and SSI this admission. Resume home regimen. ?  ?Hypokalemia ?Hypomagnesemia ?Hypophosphatemia ?Supplementation given with resolution. ?  ?Constipation ?Resolved with bowel regimen. ?  ?Hyponatremia ?Resolved. ?  ?Acute blood loss anemia ?Baseline hemoglobin around 14-15. Downward drift, likely secondary to perioperative blood loss. Hemoglobin currently stable at 8.6 - 9. ?  ?Increased nutrient needs ?Secondary to acute illness. Albumin undetectable. Registered dietitian consulted. ?Dietitian recommendations (4/26): ?Continue Ensure Max po BID, each supplement provides 150 kcal and 30 grams of protein ?Continue double protein portions TID with meals ?Continue MVI with minerals daily ?Encourage PO intake ? ?  ? ? ?Consultants:  ?Cardiothoracic surgery ?Infectious disease ?Orthopedic surgery ?Gastroenterology ?Hematology/oncology ? ?Procedures performed:  ?Incision and drainage of left sternoclavicular joint abscess with excisional debridement and pulse lavage (4/17) ?Wound VAC change and excisional debridement of left sternoclavicular joint abscess (4/20)  ? ?Disposition: Home health ?Diet recommendation:  ?Discharge Diet Orders (From admission, onward)  ? ?  Start     Ordered  ? 12/02/21 0000  Diet - low sodium heart healthy       ? 12/02/21 1056  ? ?  ?  ? ?  ? ?Carb modified diet ?DISCHARGE MEDICATION: ?Allergies as of 12/18/2021   ? ?   Reactions  ? Codeine Itching  ? Glipizide Er [glipizide]   ? SOB, malaise. He tolerates XL without issues.  ? Metformin And Related Rash  ? ?  ? ?  ?Medication List  ?  ? ?STOP taking these medications   ? ?dapagliflozin propanediol 10 MG Tabs tablet ?Commonly known as: [place] ?  ?glipiZIDE 10 MG 24 hr tablet ?Commonly known as:  glipiZIDE XL ?  ?ibuprofen 200 MG tablet ?Commonly known as: ADVIL ?  ?TYLENOL COLD MULTI-SYMPTOM PO ?  ? ?  ? ?TAKE these medications   ? ?Accu-Chek Aviva Plus test strip ?Generic drug: glucose blood ?Use to test blood glucose daily. ?  ?acetaminophen 325 MG tablet ?Commonly known as: TYLENOL ?Take 2 tablets (650 mg total) by mouth every 6 (six) hours as needed for mild pain, fever or headache (or Fever >/= 101). ?  ?apixaban 5 MG Tabs tablet ?Commonly known as: ELIQUIS ?Take 1 tablet (5 mg total) by mouth 2 (two) times daily. ?  ?benzonatate 200 MG capsule ?Commonly known as: TESSALON ?Take 1 capsule (200 mg total) by mouth 3 (three) times daily as needed for cough. ?  ?cefTRIAXone  IVPB ?Commonly known as: ROCEPHIN ?Inject 2 g into the vein daily. Indication:   Strep pneumo bacteremia/septic arthritis ?First Dose: Yes ?Last Day of Therapy:  01/18/2022 ?Labs - Once weekly:  CBC/D and BMP, ?Labs - Every other week:  ESR and CRP ?Method of administration: IV Push ?Method of administration may be changed at the discretion of home infusion pharmacist based upon assessment of the patient and/or caregiver's ability to self-administer the medication ordered. ?  ?EQ SORE THROAT SPRAY MT ?Use as directed 1 spray in the mouth or throat daily as needed (sore throat). ?  ?NovoLIN 70/30 Kwikpen (70-30) 100 UNIT/ML KwikPen ?Generic drug: insulin isophane & regular human KwikPen ?Inject 14 Units into the skin 2 (two) times daily with a meal. ?  ?oxyCODONE 5 MG immediate release tablet ?Commonly known as: Oxy IR/ROXICODONE ?Take 1-2 tablets (5-10 mg total) by mouth every 4 (four) hours as needed for  moderate pain or severe pain. ?  ?Pen Needles 3/16" 31G X 5 MM Misc ?14 Units by Does not apply route 2 (two) times daily with a meal. ?  ?polyethylene glycol 17 g packet ?Commonly known as: MIRALAX / GLYCOLAX ?Take 17 g by mouth daily as needed. ?  ?sildenafil 100 MG tablet ?Commonly known as: Viagra ?Take 0.5-1 tablets (50-100 mg  total) by mouth daily as needed for erectile dysfunction. ?  ?VICKS VAPORUB EX ?Apply 1 application. topically daily as needed (chest congestion). ?  ? ?  ? ?  ?  ? ? ?  ?Durable Medical Equipment  ?(From admission, onwar

## 2021-12-18 NOTE — Telephone Encounter (Signed)
FMLA paperwork is completed and upfront for patient wife to pick up. Patient was notified  ?

## 2021-12-18 NOTE — Progress Notes (Signed)
? ?   ?  [address] ?      [name] [address] ?            [phone]   ? ?  ?10 Days Post-Op Procedure(s) (LRB): ?TRANSESOPHAGEAL ECHOCARDIOGRAM (TEE) (N/A) ?Subjective: ?Alert and oriented, denies pain.  ?Tolerated wound vac change this morning with oral medication.  ? ?Objective: ?Vital signs in last 24 hours: ?Temp:  [98.1 ?F (36.7 ?C)-99 ?F (37.2 ?C)] 98.7 ?F (37.1 ?C) (05/01 9449) ?Pulse Rate:  [87-111] 87 (05/01 0510) ?Cardiac Rhythm: Normal sinus rhythm (05/01 0744) ?Resp:  [14-20] 15 (05/01 0837) ?BP: (120-129)/(78-97) 124/84 (05/01 6759) ?SpO2:  [96 %-97 %] 96 % (05/01 0837) ?Weight:  [71.6 kg] 71.6 kg (05/01 0510) ? ?  ? ?Intake/Output from previous day: ?04/30 0701 - 05/01 0700 ?In: -  ?Out: 1620 [Urine:1000; Drains:620] ?Intake/Output this shift: ?Total I/O ?In: -  ?Out: 700 [Urine:700] ? ?General appearance: alert, cooperative, and no distress ?Neurologic: intact ?Heart: RRR ?Wound: Wound vac in place over open [place] joint wound on the left chest. The device is functioning appropriately with minimal drainage.  ? ?Lab Results: ?No results for input(s): WBC, HGB, HCT, PLT in the last 72 hours. ?BMET: No results for input(s): NA, K, CL, CO2, GLUCOSE, BUN, CREATININE, CALCIUM in the last 72 hours.  ?PT/INR: No results for input(s): LABPROT, INR in the last 72 hours. ?ABG ?No results found for: PHART, HCO3, TCO2, ACIDBASEDEF, O2SAT ?CBG (last 3)  ?Recent Labs  ?  12/17/21 ?1611 12/17/21 ?2108 12/18/21 ?1638  ?GLUCAP 276* 148* 231*  ? ? ?Assessment/Plan: ?S/P Procedure(s) (LRB): ?TRANSESOPHAGEAL ECHOCARDIOGRAM (TEE) (N/A) ? ?-POD14 incision and drainage of left [place] joint abscess and placement of a wound vac. Home antibiotic therapy arranged. Will request home wound vac equipment and supplies.  OK to discharge from CT surgery standpoint when these have been arranged.  ?We will follow up with him in the office next week.  ? ? LOS: 26 days  ? ? ?Leary Roca,  PA-C ?[phone] ?12/18/2021 ? ? ?

## 2021-12-18 NOTE — Telephone Encounter (Signed)
Does he have an appointment to see me for follow-up of hospital admission?  He was seriously ill earlier last month and not sure what happened since.  Office visit is the best way to assess his present condition.

## 2021-12-18 NOTE — Progress Notes (Signed)
Patient discharge instructions reviewed with patient extensively, all questions answered with patient safety in mind. Patient questioning numerous portions of the discharge orders and patient to call his PCP regarding insulin and po medications.  ?Insulin pen training set brought into patient to feel, touch and operate. Patient maneuvered with the pen, turning dose to 14, pressing the end and ensuring all insulin delivered. Discussed use of the disposable needles properly to ensure safety. ?Patient understands that he must use the insulin until he sees his PCP. Patient verbalizes understanding of all instructions ?

## 2021-12-18 NOTE — Progress Notes (Signed)
Patient via wheelchair in stable condition to wife's waiting car. Patient's loaner wound vac from [employer] applied and functioning properly. Canisters and dressing changes included. Patient reminded to take dressing to Dr. Celestia Khat visit ?

## 2021-12-18 NOTE — Telephone Encounter (Signed)
He must then continue medications as advised by the hospital staff.

## 2021-12-18 NOTE — Consult Note (Signed)
? ?  [facility] CM Inpatient Consult ? ? ?12/18/2021 ? ?Bartosz Luginbill ?[birth year] ?[id number] ? ?Follow up:  [facility] Embedded  ? ?[facility] Customer service manager [[facility]]  Occupational hygienist [ACO] Patient: David Strong ? ?Primary Care Provider:  [name], MD, [facility],   is an embedded provider with a Chronic Care Management team and program, and is listed for the transition of care follow up and appointments. ? ?Patient was screened for [facility] and for post Strong Embedded Care needs.  Came to speak with patient and he states, "I was just on the phone with my doctor's office because these medications are confusing to me and I don't want to leave and not have the correct regimen for my diabetes."  Explained to patient the reason for this writer is for post Strong support with the Embedded provider office with potential care coordination/chronic care management needs. Patient is truly perplexed about his po diabetes medications and to have an insulin ordered. "I have never used insulin in my home regimen and I have never injected insulin before. Patient also verbalized concerns for his Wound Vac needs."  ? ?This writer is reaching out to inpatient [facility] RNCM for assistance and to RN for follow up needs.  Encouraged patient to follow up with PCP and gave an appointment reminder card, 24 hour nurse advise line, encouraged patient to sign up for MyChart. ? ?Plan: Patient agrees to post Strong referral Embedded Care Management and made aware of TOC needs for post Strong needs. ? ?Please contact for further questions, ? ?Charlesetta Shanks, RN BSN CCM ?[facility] [employer] Liaison ? [phone] business mobile phone ?Toll free office [phone]  ?Fax number: [phone] ?[email or web address] ? ? ? ? ?

## 2021-12-18 NOTE — Telephone Encounter (Signed)
Pt states hospital MD recommended him to d/c glipizide and farxiga ? ?Pt states he refused until he speaks w/ Dr. Alvy Bimler ? ?Medications are on pt d/c list ? ?Pt requesting a c/b regarding if Dr. Alvy Bimler wants him to d/c the medications ? ?

## 2021-12-18 NOTE — Consult Note (Signed)
WOC Nurse Consult Note: ?Patient receiving care in [facility] 2C17 ?Reason for Consult: vac dressing change ?Wound type: surgical ?Pressure Injury POA: NA ?Measurement: 8.5 x 2.8 x 3 ?Wound bed: Mostly granulating with some minor bleeding at removal of the foam.  ?Drainage (amount, consistency, odor) serosanguinous ?Periwound: intact ?Dressing procedure/placement/frequency: ?Dressing gently removed. 1 piece black foam and 2 piece white foam. Replaced with1 piece white foam in the joint space and 1 piece of white foam over entire surface area, a thin small piece black foam over the white foam. Drape applied, immediate suction obtained at 125 mmHg. Patient premedicated and tolerated the procedure well.  ?Possible discharge today.  ?  ?WOC will follow and change vac dressing on Monday/Thursday. ?  ?Renaldo Reel. Katrinka Blazing, MSN, RN, CMSRN, AGCNS, WTA ?Wound Treatment Associate ?Pager [phone]  ?

## 2021-12-18 NOTE — Progress Notes (Signed)
Physical Therapy Treatment ?Patient Details ?Name: David Strong ?MRN: [id number] ?DOB: 09-02-1955 ?Today's Date: 12/18/2021 ? ? ?History of Present Illness Pt. is a 66 y.o. M presenting to [facility] 4/5 Admitted for severe sepsis, POA due to streptococcal pneumonia with bacteremia, left sternoclavicular joint septic arthritis and other large joint arthralgia/arthritis (right shoulder, left knee). Pt underwent I&D of [place] joint with wound vac placement on 4/17. PMH significant for T2DM.  CTA showed no PE, confirmed multifocal pneumonia and MRI shows left sternoclavicular joint inflammatory changes with scattered foci of air within the soft tissues and sternoclavicular joint space compatible with localized left sternoclavicular septic arthritis/cellulitis, without evidence of drainable fluid collection or large joint effusion.  Subsequent BCID and urine antigen positive for strep.  Antibiotics narrowed to IV ceftriaxone.  IR ultrasound shows +RUE superficial thrombosis and L LE DVT. ? ?  ?PT Comments  ? ? Pt making steady progress towards his physical therapy goals. Demonstrates improved RUE edema and states he has been compliant with ROM exercises. Pt ambulating 200 ft with a walker at a supervision level. Pt continues with balance deficits, weakness, and decreased ROM. D/c plan remains appropriate.  ?   ?Recommendations for follow up therapy are one component of a multi-disciplinary discharge planning process, led by the attending physician.  Recommendations may be updated based on patient status, additional functional criteria and insurance authorization. ? ?Follow Up Recommendations ? Home health PT ?  ?  ?Assistance Recommended at Discharge Intermittent Supervision/Assistance  ?Patient can return home with the following A little help with walking and/or transfers;A little help with bathing/dressing/bathroom;Assistance with cooking/housework;Assist for transportation;Help with stairs or ramp for entrance ?  ?Equipment  Recommendations ? Rolling walker (2 wheels)  ?  ?Recommendations for Other Services   ? ? ?  ?Precautions / Restrictions Precautions ?Precautions: Fall;Other (comment) ?Precaution Comments: wound vac chest ?Restrictions ?Weight Bearing Restrictions: No  ?  ? ?Mobility ? Bed Mobility ?  ?  ?  ?  ?  ?  ?  ?General bed mobility comments: Sitting EOB upon arrival ?  ? ?Transfers ?Overall transfer level: Needs assistance ?Equipment used: Rolling walker (2 wheels) ?Transfers: Sit to/from Stand ?Sit to Stand: Supervision ?  ?  ?  ?  ?  ?  ?  ? ?Ambulation/Gait ?Ambulation/Gait assistance: Supervision ?Gait Distance (Feet): 200 Feet ?Assistive device: Rolling walker (2 wheels), None ?Gait Pattern/deviations: Step-through pattern, Decreased stride length, Trunk flexed, Shuffle, Step-to pattern ?Gait velocity: decreased ?Gait velocity interpretation: <1.8 ft/sec, indicate of risk for recurrent falls ?  ?General Gait Details: Slowed step to vs step through pattern, supervision for safety, no gross imbalance ? ? ?Stairs ?  ?  ?  ?  ?  ? ? ?Wheelchair Mobility ?  ? ?Modified Rankin (Stroke Patients Only) ?  ? ? ?  ?Balance Overall balance assessment: Needs assistance ?Sitting-balance support: Feet supported, No upper extremity supported ?Sitting balance-Leahy Scale: Good ?  ?  ?Standing balance support: During functional activity, No upper extremity supported, Bilateral upper extremity supported ?Standing balance-Leahy Scale: Poor ?  ?  ?  ?  ?  ?  ?  ?  ?  ?  ?  ?  ?  ? ?  ?Cognition Arousal/Alertness: Awake/alert ?Behavior During Therapy: [facility] for tasks assessed/performed ?Overall Cognitive Status: No family/caregiver present to determine baseline cognitive functioning ?  ?  ?  ?  ?  ?  ?  ?  ?  ?  ?  ?  ?  ?  ?  ?  ?  General Comments: STM deficits ?  ?  ? ?  ?Exercises General Exercises - Upper Extremity ?Shoulder Flexion: AAROM, Right, 10 reps, Seated ? ?  ?General Comments   ?  ?  ? ?Pertinent Vitals/Pain Pain  Assessment ?Pain Assessment: Faces ?Faces Pain Scale: Hurts little more ?Pain Location: L foot, R shoulder ?Pain Descriptors / Indicators: Sore, Discomfort, Grimacing, Guarding ?Pain Intervention(s): Limited activity within patient's tolerance, Monitored during session  ? ? ?Home Living   ?  ?  ?  ?  ?  ?  ?  ?  ?  ?   ?  ?Prior Function    ?  ?  ?   ? ?PT Goals (current goals can now be found in the care plan section) Acute Rehab PT Goals ?Patient Stated Goal: to return to playing with his infant son ?Potential to Achieve Goals: Good ?Progress towards PT goals: Progressing toward goals ? ?  ?Frequency ? ? ? Min 3X/week ? ? ? ?  ?PT Plan Current plan remains appropriate  ? ? ?Co-evaluation   ?  ?  ?  ?  ? ?  ?AM-PAC PT "6 Clicks" Mobility   ?Outcome Measure ? Help needed turning from your back to your side while in a flat bed without using bedrails?: None ?Help needed moving from lying on your back to sitting on the side of a flat bed without using bedrails?: A Little ?Help needed moving to and from a bed to a chair (including a wheelchair)?: A Little ?Help needed standing up from a chair using your arms (e.g., wheelchair or bedside chair)?: A Little ?Help needed to walk in hospital room?: A Little ?Help needed climbing 3-5 steps with a railing? : A Little ?6 Click Score: 19 ? ?  ?End of Session   ?Activity Tolerance: Patient tolerated treatment well ?Patient left: in bed;with call bell/phone within reach ?Nurse Communication: Mobility status ?PT Visit Diagnosis: Difficulty in walking, not elsewhere classified (R26.2);Pain;Unsteadiness on feet (R26.81);Other abnormalities of gait and mobility (R26.89) ?  ? ? ?Time: 6503-5465 ?PT Time Calculation (min) (ACUTE ONLY): 22 min ? ?Charges:  $Therapeutic Activity: 8-22 mins          ?          ? ?Lillia Pauls, PT, DPT ?Acute Rehabilitation Services ?Pager [phone] ?Office [phone] ? ? ? ?Carloine Ernestina Penna ?12/18/2021, 10:52 AM ? ?

## 2021-12-18 NOTE — TOC Progression Note (Addendum)
Transition of Care (TOC) - Progression Note  ? ? ?Patient Details  ?Name: David Strong ?MRN: [id number] ?Date of Birth: 07/03/1956 ? ?Transition of Care (TOC) CM/SW Contact  ?Beckie Busing, RN ?Phone Number:[phone] ? ?12/18/2021, 12:11 PM ? ?Clinical Narrative:    ?CM on unit and bedside nurse [place]  RN made CM aware that patient will need home wound vac. RN and CM reviews chart and there are currently no home wound vac orders . RN reached out to surgical PA and told CM that order would need to be obtained by the attending. CM has spoke with Attending who clarified that surgery is following and should sign orders. CM has sent wound vac information to Leonia Reeves at [facility] for wound vac. CM has sent message to Jillyn Hidden PA-C.  ? ?1224 CM attempted to reach Jillyn Hidden PA-C at number provided by [place] RN  [phone] no success. Secure chat has been sent. TOC will follow.  ? ?1248 CM spoke with Carolann Littler PA-c who states that he is in the office and will not be at hospital until after 2pm and gave CM contact info for Erin who may be able to sign sooner.  ? ?1430 Wound vac order has been signed by Lowella Dandy PA-C. Order faxed to [place] at [facility].  ? ?1630 Wound vac delivered to bedside. Delivery form signed and faxed to [facility]. Bedside nurse made aware ? ?Expected Discharge Plan: Home/Self Care ?Barriers to Discharge: Barriers Resolved ? ?Expected Discharge Plan and Services ?Expected Discharge Plan: Home/Self Care ?  ?Discharge Planning Services: CM Consult ?Post Acute Care Choice: NA ?Living arrangements for the past 2 months: Single Family Home ?                ?  ?  ?  ?  ?  ?  ?  ?  ?  ?  ? ? ?Social Determinants of Health (SDOH) Interventions ?  ? ?Readmission Risk Interventions ?   ? View : No data to display.  ?  ?  ?  ? ? ?

## 2021-12-18 NOTE — Telephone Encounter (Signed)
FMLA paperwork completed. Put in provider folder for signature. Will call patient once the forms are ready for pick up  ?

## 2021-12-18 NOTE — Plan of Care (Signed)

## 2021-12-18 NOTE — Telephone Encounter (Signed)
Called patient to inform him of provider recommendation. Patient verbalize understanding.  ?

## 2021-12-19 DIAGNOSIS — R7881 Bacteremia: Secondary | ICD-10-CM | POA: Diagnosis not present

## 2021-12-19 DIAGNOSIS — R6520 Severe sepsis without septic shock: Secondary | ICD-10-CM | POA: Diagnosis not present

## 2021-12-19 DIAGNOSIS — A419 Sepsis, unspecified organism: Secondary | ICD-10-CM | POA: Diagnosis not present

## 2021-12-20 ENCOUNTER — Telehealth: Payer: Self-pay | Admitting: *Deleted

## 2021-12-20 DIAGNOSIS — I824Z2 Acute embolism and thrombosis of unspecified deep veins of left distal lower extremity: Secondary | ICD-10-CM | POA: Diagnosis not present

## 2021-12-20 DIAGNOSIS — E876 Hypokalemia: Secondary | ICD-10-CM | POA: Diagnosis not present

## 2021-12-20 DIAGNOSIS — M71062 Abscess of bursa, left knee: Secondary | ICD-10-CM | POA: Diagnosis not present

## 2021-12-20 DIAGNOSIS — I82442 Acute embolism and thrombosis of left tibial vein: Secondary | ICD-10-CM | POA: Diagnosis not present

## 2021-12-20 DIAGNOSIS — Z794 Long term (current) use of insulin: Secondary | ICD-10-CM | POA: Diagnosis not present

## 2021-12-20 DIAGNOSIS — M009 Pyogenic arthritis, unspecified: Secondary | ICD-10-CM | POA: Diagnosis not present

## 2021-12-20 DIAGNOSIS — D696 Thrombocytopenia, unspecified: Secondary | ICD-10-CM | POA: Diagnosis not present

## 2021-12-20 DIAGNOSIS — E119 Type 2 diabetes mellitus without complications: Secondary | ICD-10-CM | POA: Diagnosis not present

## 2021-12-20 DIAGNOSIS — Z792 Long term (current) use of antibiotics: Secondary | ICD-10-CM | POA: Diagnosis not present

## 2021-12-20 DIAGNOSIS — I82432 Acute embolism and thrombosis of left popliteal vein: Secondary | ICD-10-CM | POA: Diagnosis not present

## 2021-12-20 DIAGNOSIS — Z7901 Long term (current) use of anticoagulants: Secondary | ICD-10-CM | POA: Diagnosis not present

## 2021-12-20 DIAGNOSIS — Z79891 Long term (current) use of opiate analgesic: Secondary | ICD-10-CM | POA: Diagnosis not present

## 2021-12-20 DIAGNOSIS — M60111 Interstitial myositis, right shoulder: Secondary | ICD-10-CM | POA: Diagnosis not present

## 2021-12-20 DIAGNOSIS — Z452 Encounter for adjustment and management of vascular access device: Secondary | ICD-10-CM | POA: Diagnosis not present

## 2021-12-20 DIAGNOSIS — A409 Streptococcal sepsis, unspecified: Secondary | ICD-10-CM | POA: Diagnosis not present

## 2021-12-20 NOTE — Chronic Care Management (AMB) (Signed)
?  Care Management  ? ?Note ? ?12/20/2021 ?Name: Mohannad Berrigan MRN: [id number] DOB: 11/12/1955 ? ?Yiyang Carlen is a 66 y.o. year old male who is a primary care patient of Horald Pollen, MD. I reached out to [employer] by phone today offer care coordination services.  ? ?Mr. Walter was given information about care management services today including:  ?Care management services include personalized support from designated clinical staff supervised by his physician, including individualized plan of care and coordination with other care providers ?24/7 contact phone numbers for assistance for urgent and routine care needs. ?The patient may stop care management services at any time by phone call to the office staff. ? ?Patient agreed to services and verbal consent obtained.  ? ?Follow up plan: ?Telephone appointment with care management team member scheduled for:12/28/21 ? ?Laverda Sorenson  ?Care Guide, Embedded Care Coordination ?[facility]  Care Management  ?Direct Dial: [phone] ? ?

## 2021-12-22 DIAGNOSIS — E876 Hypokalemia: Secondary | ICD-10-CM | POA: Diagnosis not present

## 2021-12-22 DIAGNOSIS — M60111 Interstitial myositis, right shoulder: Secondary | ICD-10-CM | POA: Diagnosis not present

## 2021-12-22 DIAGNOSIS — I82432 Acute embolism and thrombosis of left popliteal vein: Secondary | ICD-10-CM | POA: Diagnosis not present

## 2021-12-22 DIAGNOSIS — I82442 Acute embolism and thrombosis of left tibial vein: Secondary | ICD-10-CM | POA: Diagnosis not present

## 2021-12-22 DIAGNOSIS — Z7901 Long term (current) use of anticoagulants: Secondary | ICD-10-CM | POA: Diagnosis not present

## 2021-12-22 DIAGNOSIS — M009 Pyogenic arthritis, unspecified: Secondary | ICD-10-CM | POA: Diagnosis not present

## 2021-12-22 DIAGNOSIS — E119 Type 2 diabetes mellitus without complications: Secondary | ICD-10-CM | POA: Diagnosis not present

## 2021-12-22 DIAGNOSIS — I824Z2 Acute embolism and thrombosis of unspecified deep veins of left distal lower extremity: Secondary | ICD-10-CM | POA: Diagnosis not present

## 2021-12-22 DIAGNOSIS — Z79891 Long term (current) use of opiate analgesic: Secondary | ICD-10-CM | POA: Diagnosis not present

## 2021-12-22 DIAGNOSIS — D696 Thrombocytopenia, unspecified: Secondary | ICD-10-CM | POA: Diagnosis not present

## 2021-12-22 DIAGNOSIS — Z452 Encounter for adjustment and management of vascular access device: Secondary | ICD-10-CM | POA: Diagnosis not present

## 2021-12-22 DIAGNOSIS — M71062 Abscess of bursa, left knee: Secondary | ICD-10-CM | POA: Diagnosis not present

## 2021-12-22 DIAGNOSIS — Z794 Long term (current) use of insulin: Secondary | ICD-10-CM | POA: Diagnosis not present

## 2021-12-22 DIAGNOSIS — A409 Streptococcal sepsis, unspecified: Secondary | ICD-10-CM | POA: Diagnosis not present

## 2021-12-22 DIAGNOSIS — Z792 Long term (current) use of antibiotics: Secondary | ICD-10-CM | POA: Diagnosis not present

## 2021-12-23 DIAGNOSIS — R6520 Severe sepsis without septic shock: Secondary | ICD-10-CM | POA: Diagnosis not present

## 2021-12-23 DIAGNOSIS — A419 Sepsis, unspecified organism: Secondary | ICD-10-CM | POA: Diagnosis not present

## 2021-12-23 DIAGNOSIS — R7881 Bacteremia: Secondary | ICD-10-CM | POA: Diagnosis not present

## 2021-12-25 ENCOUNTER — Telehealth: Payer: Self-pay | Admitting: Emergency Medicine

## 2021-12-25 DIAGNOSIS — A419 Sepsis, unspecified organism: Secondary | ICD-10-CM | POA: Diagnosis not present

## 2021-12-25 DIAGNOSIS — R6520 Severe sepsis without septic shock: Secondary | ICD-10-CM | POA: Diagnosis not present

## 2021-12-25 DIAGNOSIS — R7881 Bacteremia: Secondary | ICD-10-CM | POA: Diagnosis not present

## 2021-12-25 NOTE — Telephone Encounter (Signed)
Negative.

## 2021-12-25 NOTE — Telephone Encounter (Signed)
1.Medication Requested: ?oxyCODONE (OXY IR/ROXICODONE) 5 MG immediate release tablet ?2. Pharmacy (Name, Street, [place]): ?[id number] - [place], [place] - [address] Phone:  [phone]  ?Fax:  [phone]  ?  ? ?3. On Med List: ? ?4. Last Visit with PCP: ? ?5. Next visit date with PCP: ? ? ?Agent: Please be advised that RX refills may take up to 3 business days. We ask that you follow-up with your pharmacy.  ?

## 2021-12-25 NOTE — Telephone Encounter (Signed)
Pt requesting a call back from assistant.  ? ?Message was relayed several times that MD is unable to prescribe as he was not the prescriber ?

## 2021-12-25 NOTE — Telephone Encounter (Signed)
Called patient and left voicemail for patient to call back. If patient calls back ok to relay provider response to his medication request. Its nothing I can do. The provider is not going to refill this oxycodone as it was prescribed by a provider at the hospital on d/c. ?

## 2021-12-26 ENCOUNTER — Telehealth: Payer: Self-pay | Admitting: Emergency Medicine

## 2021-12-26 ENCOUNTER — Other Ambulatory Visit: Payer: Self-pay | Admitting: Thoracic Surgery (Cardiothoracic Vascular Surgery)

## 2021-12-26 ENCOUNTER — Ambulatory Visit: Payer: Medicare Other | Admitting: Thoracic Surgery (Cardiothoracic Vascular Surgery)

## 2021-12-26 ENCOUNTER — Ambulatory Visit
Admission: RE | Admit: 2021-12-26 | Discharge: 2021-12-26 | Disposition: A | Discharge status: Home / Self Care | Payer: Medicare Other | Source: Ambulatory Visit | Attending: Thoracic Surgery (Cardiothoracic Vascular Surgery) | Admitting: Thoracic Surgery (Cardiothoracic Vascular Surgery)

## 2021-12-26 ENCOUNTER — Telehealth: Payer: Self-pay | Admitting: *Deleted

## 2021-12-26 ENCOUNTER — Other Ambulatory Visit: Payer: Self-pay

## 2021-12-26 ENCOUNTER — Inpatient Hospital Stay: Payer: Medicare Other | Admitting: Internal Medicine

## 2021-12-26 VITALS — BP 139/96 | HR 101 | Resp 20 | Ht 64.0 in | Wt 158.0 lb

## 2021-12-26 DIAGNOSIS — M009 Pyogenic arthritis, unspecified: Secondary | ICD-10-CM

## 2021-12-26 DIAGNOSIS — Z9889 Other specified postprocedural states: Secondary | ICD-10-CM

## 2021-12-26 DIAGNOSIS — Z452 Encounter for adjustment and management of vascular access device: Secondary | ICD-10-CM | POA: Diagnosis not present

## 2021-12-26 DIAGNOSIS — A419 Sepsis, unspecified organism: Secondary | ICD-10-CM | POA: Diagnosis not present

## 2021-12-26 DIAGNOSIS — J9811 Atelectasis: Secondary | ICD-10-CM | POA: Diagnosis not present

## 2021-12-26 DIAGNOSIS — Z09 Encounter for follow-up examination after completed treatment for conditions other than malignant neoplasm: Secondary | ICD-10-CM | POA: Diagnosis not present

## 2021-12-26 DIAGNOSIS — Z872 Personal history of diseases of the skin and subcutaneous tissue: Secondary | ICD-10-CM | POA: Diagnosis not present

## 2021-12-26 IMAGING — CR DG CHEST 2V
2 series · 2 of 2 positions shown · non-contrast
Comparison: [DATE].  Chest CT [DATE]

CLINICAL DATA: Postop for septic sternoclavicular joint debridement
and abscess

EXAM:
CHEST - 2 VIEW

[w chest pa]
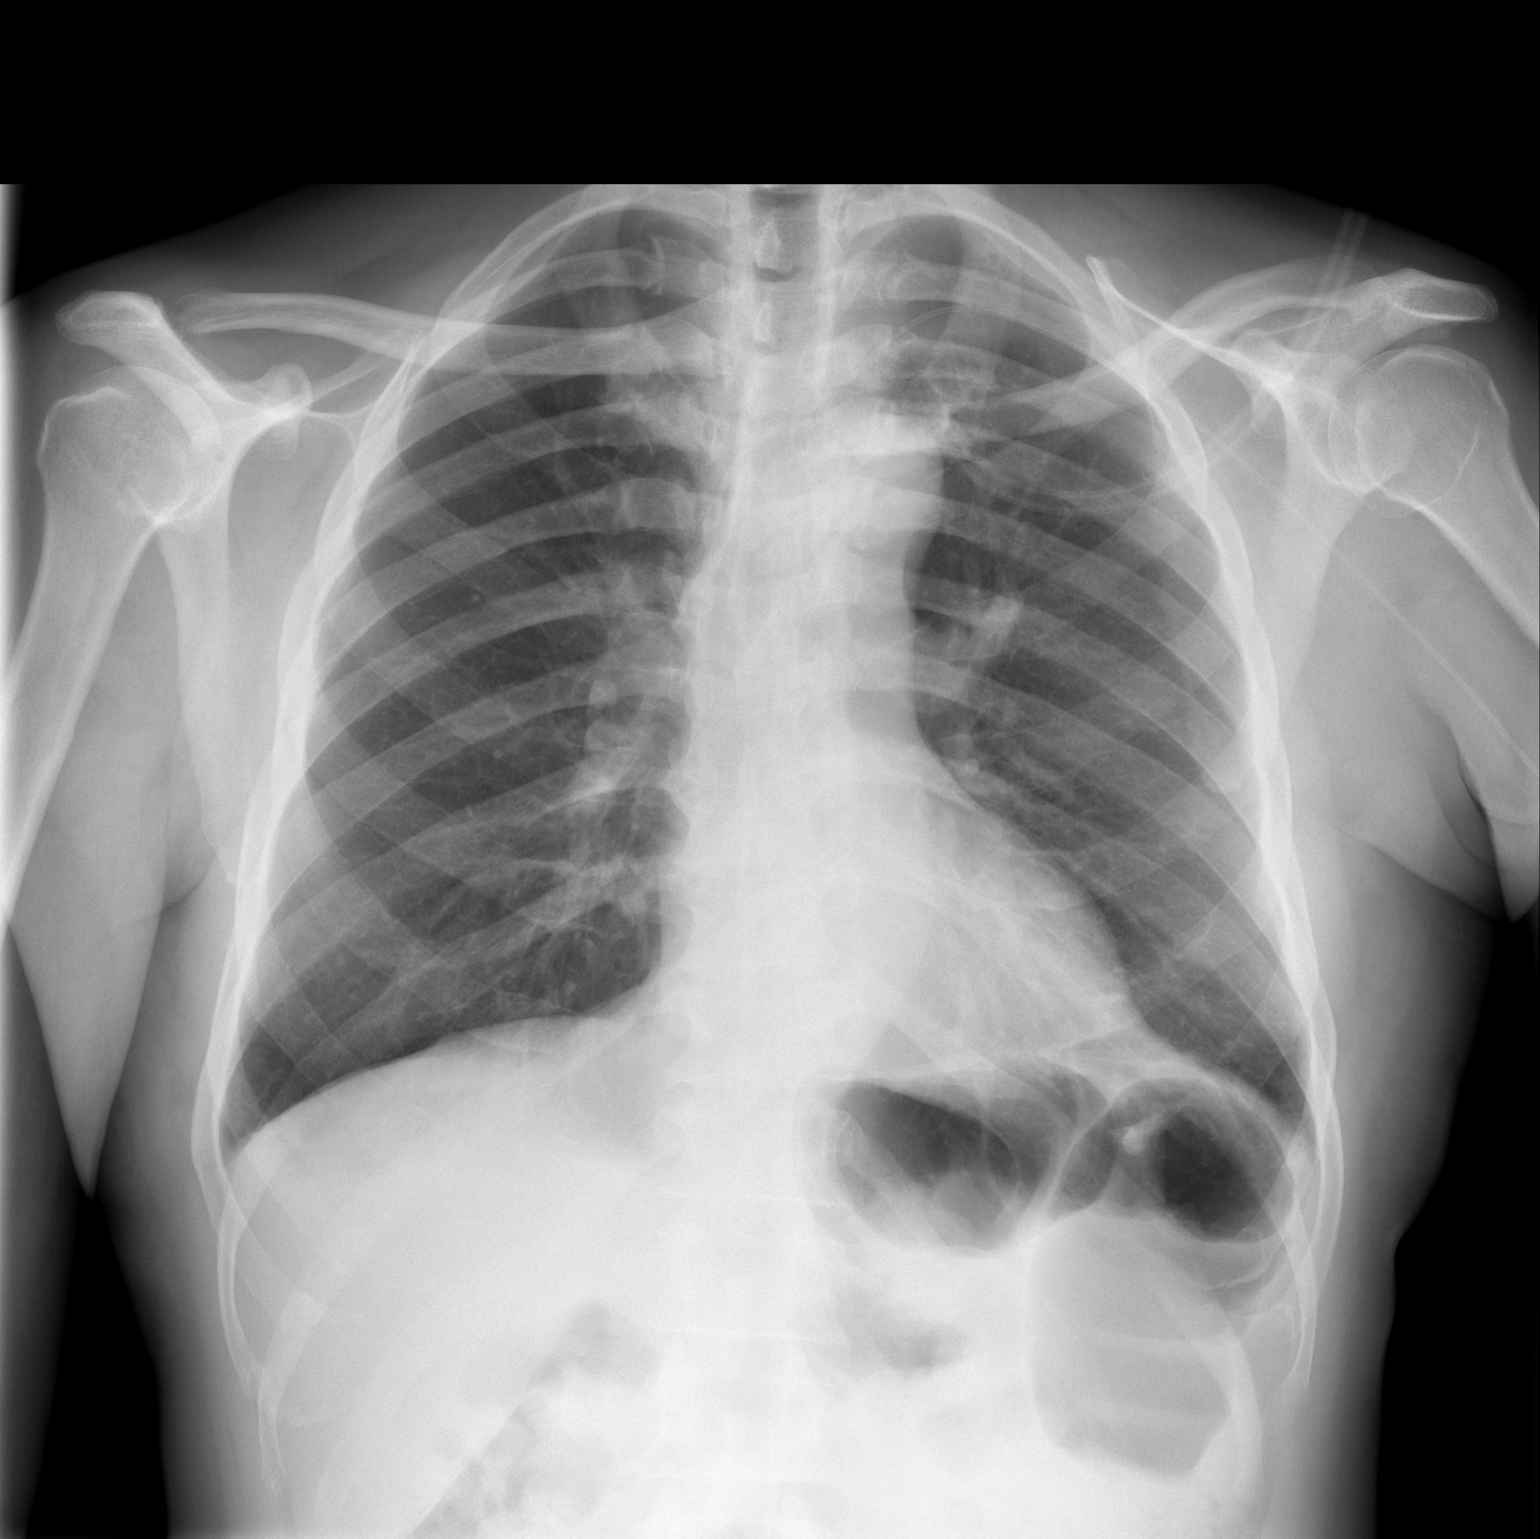

[w chest lat]
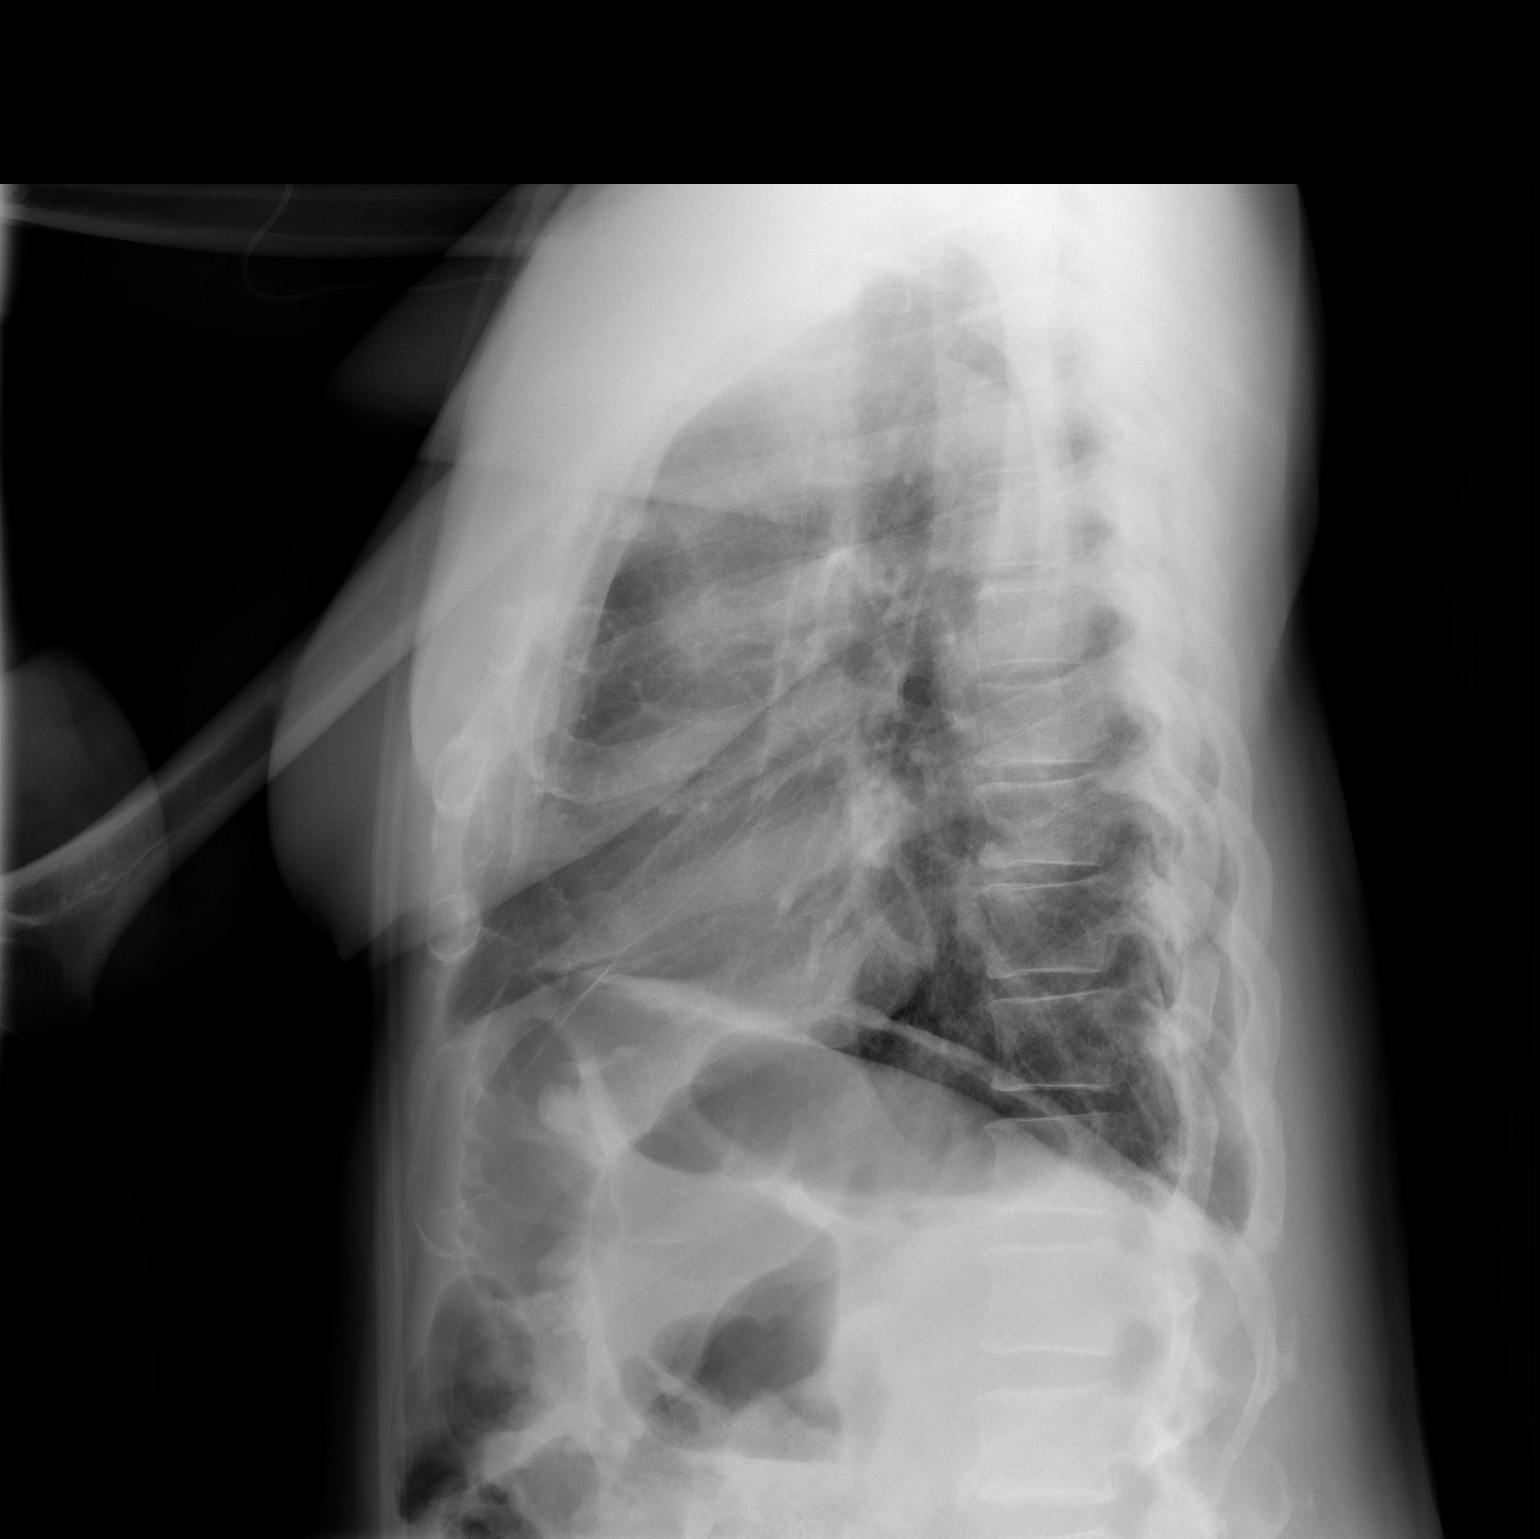

[2 of 2 positions shown; findings below may reference images not displayed]

FINDINGS: Cardiac and mediastinal contours normal. Pulmonary vascularity
normal. Mild left lower lobe atelectasis. No pleural effusion or
pneumothorax

Asymmetry of the clavicles with the left clavicle head lower than
the right. No fracture.

Left arm PICC tip in the mid SVC.
IMPRESSION: Mild left lower lobe atelectasis. No significant pleural effusion or
pneumothorax.

## 2021-12-26 MED ORDER — POTASSIUM CHLORIDE CRYS ER 10 MEQ PO TBCR: 10.0000 meq | EXTENDED_RELEASE_TABLET | Freq: Every day | ORAL | 1 refills | Status: DC

## 2021-12-26 MED ORDER — OXYCODONE HCL 5 MG PO TABS: 5.0000 mg | ORAL_TABLET | Freq: Three times a day (TID) | ORAL | 0 refills | Status: DC | PRN

## 2021-12-26 MED ORDER — FUROSEMIDE 20 MG PO TABS: 20.0000 mg | ORAL_TABLET | Freq: Every day | ORAL | 1 refills | Status: DC

## 2021-12-26 NOTE — Telephone Encounter (Signed)
Connected to [facility]  5.6.2023. ? ?Caller is from home health agency taking care of ?patient woundvac fsbs 301 this morning and she states patient has no s/s of high sugar but she had to call it in due to the number. ? ?Advised home care. ? ?

## 2021-12-26 NOTE — Telephone Encounter (Signed)
At some point he is going to need an office visit for diabetes follow-up.  Thanks.

## 2021-12-26 NOTE — Telephone Encounter (Signed)
Called [facility] at [phone] for patient regarding dying wound vac machine. Spoke with Reuel Boom. Per Reuel Boom, he is going to contact the patient to discuss vac malfunction. Patient advised to be on the look out for a call from [facility] to discuss. Patient verbalized understanding.  ?

## 2021-12-26 NOTE — Telephone Encounter (Signed)
Okay. Thank you.

## 2021-12-26 NOTE — Progress Notes (Signed)
? ?   ?[place] ?      [place] [address] ?            [phone]   ? ?  ?HPI: [name] returns for follow-up of his infected supraclavicular joint ? ?David Strong is a 66 year old gentleman admitted recently with pneumonia and probably arthritis.  He developed septic arthritis of his left sternoclavicular joint requiring I&D and VAC placement.  While in the hospital he was also diagnosed with DVT and was started on apixaban.  He did have a TEE which showed no evidence of endocarditis. ? ?He has been discharged.  He is having his wound VAC changed twice weekly by home health nursing.  He has been having some issues with his machine lately.  He complains of some pain with movements of his arm across his body, but can lift his arm without difficulty.  His biggest complaint is swelling in his feet and legs.  He has a great deal of confusion about his medications and does not think he is taking apixaban because he thought he was on too many medications. ? ?Past Medical History:  ?Diagnosis Date  ? Diabetes mellitus without complication ([place])   ? ? ?Current Outpatient Medications  ?Medication Sig Dispense Refill  ? acetaminophen (TYLENOL) 325 MG tablet Take 2 tablets (650 mg total) by mouth every 6 (six) hours as needed for mild pain, fever or headache (or Fever >/= 101).    ? apixaban (ELIQUIS) 5 MG TABS tablet Take 1 tablet (5 mg total) by mouth 2 (two) times daily. 60 tablet 2  ? Camphor-Eucalyptus-Menthol (VICKS VAPORUB EX) Apply 1 application. topically daily as needed (chest congestion).    ? cefTRIAXone (ROCEPHIN) IVPB Inject 2 g into the vein daily. Indication:   Strep pneumo bacteremia/septic arthritis ?First Dose: Yes ?Last Day of Therapy:  01/18/2022 ?Labs - Once weekly:  CBC/D and BMP, ?Labs - Every other week:  ESR and CRP ?Method of administration: IV Push ?Method of administration may be changed at the discretion of home infusion pharmacist based upon assessment of the patient  and/or caregiver's ability to self-administer the medication ordered. 32 Units 0  ? glucose blood (ACCU-CHEK AVIVA PLUS) test strip Use to test blood glucose daily. 100 each 3  ? insulin isophane & regular human KwikPen (NOVOLIN 70/30 KWIKPEN) (70-30) 100 UNIT/ML KwikPen Inject 14 Units into the skin 2 (two) times daily with a meal. 15 mL 3  ? Insulin Pen Needle (PEN NEEDLES 3/16") 31G X 5 MM MISC 14 Units by Does not apply route 2 (two) times daily with a meal. 50 each 3  ? oxyCODONE (OXY IR/ROXICODONE) 5 MG immediate release tablet Take 1-2 tablets (5-10 mg total) by mouth every 4 (four) hours as needed for moderate pain or severe pain. 30 tablet 0  ? Phenol (EQ SORE THROAT SPRAY MT) Use as directed 1 spray in the mouth or throat daily as needed (sore throat).    ? polyethylene glycol (MIRALAX / GLYCOLAX) 17 g packet Take 17 g by mouth daily as needed. 14 each 0  ? sildenafil (VIAGRA) 100 MG tablet Take 0.5-1 tablets (50-100 mg total) by mouth daily as needed for erectile dysfunction. 5 tablet 11  ? benzonatate (TESSALON) 200 MG capsule Take 1 capsule (200 mg total) by mouth 3 (three) times daily as needed for cough. (Patient not taking: Reported on 12/26/2021) 20 capsule 0  ? ?No current facility-administered medications for this visit.  ? ? ?Physical  Exam ?Left sternoclavicular wound 7 cm in length, 2.5 cm with, 3 to 4 mm depth ?3+ edema both lower extremities ? ?Diagnostic Tests: ?Chest x-ray unremarkable ? ?Impression: ?66 year old man who presented with pneumonia and polyarthritis.  He developed a large abscess of his left sternoclavicular joint.  He underwent I&D and VAC placement.  He remains on ceftriaxone. ? ?His wound is progressing nicely.  He should continue with the [facility] with twice weekly changes.  We will see him back in 3 weeks to check up on that. ? ?Of greater concern is that he has significant confusion regarding his medications.  He felt he was taking too many so he just randomly stopped taking  some of them.  I went over his medication list with him.  Emphasized the importance of apixaban (Eliquis) as that is the treatment for his deep venous thrombosis.  He was very confused about that and thought the intravenous antibiotic was his blood thinner. ? ?I strongly recommended he make an appointment with his primary Dr. Mitchel Honour to go over his medications. ? ?His biggest complaint currently is the edema in his lower extremities.  I am going to put him on Lasix and potassium for that.  Lasix alarms as cross-reacting with glipizide, but he can take long-acting glipizide so there is no reason he cannot take Lasix. ? ?Plan: ?Call and follow-up with Dr. Mitchel Honour ASAP ?Continue ceftriaxone until course completed ?Lasix 20 mg daily for 2 weeks, 1 refill if swelling persist ?K-Dur 10 milliequivalents daily for 2 weeks, 1 refill if Lasix continued ?Continue VAC change twice weekly ?Start taking Eliquis ?Return in 3 weeks to check wound. ? ?Melrose Nakayama, MD ?[facility] ?[phone] ? ? ? ?

## 2021-12-27 ENCOUNTER — Encounter: Payer: Self-pay | Admitting: Emergency Medicine

## 2021-12-27 ENCOUNTER — Ambulatory Visit (INDEPENDENT_AMBULATORY_CARE_PROVIDER_SITE_OTHER): Payer: Medicare Other | Admitting: Emergency Medicine

## 2021-12-27 VITALS — BP 124/84 | HR 104 | Temp 98.0°F | Ht 64.0 in | Wt 159.1 lb

## 2021-12-27 DIAGNOSIS — R600 Localized edema: Secondary | ICD-10-CM

## 2021-12-27 DIAGNOSIS — I82402 Acute embolism and thrombosis of unspecified deep veins of left lower extremity: Secondary | ICD-10-CM

## 2021-12-27 DIAGNOSIS — E1165 Type 2 diabetes mellitus with hyperglycemia: Secondary | ICD-10-CM

## 2021-12-27 DIAGNOSIS — M009 Pyogenic arthritis, unspecified: Secondary | ICD-10-CM

## 2021-12-27 DIAGNOSIS — Z09 Encounter for follow-up examination after completed treatment for conditions other than malignant neoplasm: Secondary | ICD-10-CM | POA: Diagnosis not present

## 2021-12-27 LAB — CBC WITH DIFFERENTIAL/PLATELET
Basophils Absolute: 0 10*3/uL (ref 0.0–0.1)
Basophils Relative: 0.2 % (ref 0.0–3.0)
Eosinophils Absolute: 0 10*3/uL (ref 0.0–0.7)
Eosinophils Relative: 0.7 % (ref 0.0–5.0)
HCT: 31.6 % — ABNORMAL LOW (ref 39.0–52.0)
Hemoglobin: 10.7 g/dL — ABNORMAL LOW (ref 13.0–17.0)
Lymphocytes Relative: 39 % (ref 12.0–46.0)
Lymphs Abs: 1.7 10*3/uL (ref 0.7–4.0)
MCHC: 33.7 g/dL (ref 30.0–36.0)
MCV: 84.6 fl (ref 78.0–100.0)
Monocytes Absolute: 0.3 10*3/uL (ref 0.1–1.0)
Monocytes Relative: 7.4 % (ref 3.0–12.0)
Neutro Abs: 2.4 10*3/uL (ref 1.4–7.7)
Neutrophils Relative %: 52.7 % (ref 43.0–77.0)
Platelets: 300 10*3/uL (ref 150.0–400.0)
RBC: 3.74 Mil/uL — ABNORMAL LOW (ref 4.22–5.81)
RDW: 13.4 % (ref 11.5–15.5)
WBC: 4.5 10*3/uL (ref 4.0–10.5)

## 2021-12-27 LAB — COMPREHENSIVE METABOLIC PANEL
ALT: 19 U/L (ref 0–53)
AST: 14 U/L (ref 0–37)
Albumin: 3.2 g/dL — ABNORMAL LOW (ref 3.5–5.2)
Alkaline Phosphatase: 156 U/L — ABNORMAL HIGH (ref 39–117)
BUN: 8 mg/dL (ref 6–23)
CO2: 29 mEq/L (ref 19–32)
Calcium: 8.9 mg/dL (ref 8.4–10.5)
Chloride: 96 mEq/L (ref 96–112)
Creatinine, Ser: 0.63 mg/dL (ref 0.40–1.50)
GFR: 99.82 mL/min (ref >60.00)
Glucose, Bld: 312 mg/dL — ABNORMAL HIGH (ref 70–99)
Potassium: 4.2 mEq/L (ref 3.5–5.1)
Sodium: 134 mEq/L — ABNORMAL LOW (ref 135–145)
Total Bilirubin: 0.4 mg/dL (ref 0.2–1.2)
Total Protein: 7.4 g/dL (ref 6.0–8.3)

## 2021-12-27 NOTE — Assessment & Plan Note (Signed)
Stable.  Continue Eliquis 5 mg twice a day. ?Fall precautions given. ?

## 2021-12-27 NOTE — Assessment & Plan Note (Signed)
Much improved.  Recent cardiothoracic office visit notes reviewed. ?

## 2021-12-27 NOTE — Assessment & Plan Note (Signed)
With hyperglycemia.  Glucose numbers improving at home. ?Lab Results  ?Component Value Date  ? HGBA1C 8.6 (H) 11/23/2021  ?Continue glipizide 20 mg twice a day and Farxiga 10 mg daily. ?Patient never started insulin at home. ?Diet and nutrition discussed ?Follow-up in 3 months. ? ?

## 2021-12-27 NOTE — Assessment & Plan Note (Signed)
Stable.  Continue daily Lasix 20 mg. ?

## 2021-12-27 NOTE — Patient Instructions (Signed)

## 2021-12-27 NOTE — Progress Notes (Signed)
David Strong ?66 y.o. ? ? ?Chief Complaint  ?Patient presents with  ? Hospitalization Follow-up  ? ? ?HISTORY OF PRESENT ILLNESS: ?This is a 66 y.o. male A1A here for follow-up of recent hospital stay.  Feeling better. ?Still getting IV antibiotics through PICC line.  Plan is to continue until 01/18/2022. ?Recently seen by cardiothoracic surgeon for follow-up and found to be healing well. ?Patient's diabetes was out of control but once he started back on his usual meds numbers are getting better.  On glipizide twice a day and [place] once a day.  Never took insulin that was prescribed on discharge. ?Patient was admitted with sepsis secondary to pneumococcal pneumonia.  Also seeded left sternoclavicular joint and developed septic arthritis in the particular area.  Presently has wound VAC in place.  Developed DVT while in the hospital.  On anticoagulation at present time with Eliquis.  Developed anasarca and lower leg edema in the hospital secondary to hypoalbuminemia.  Was started on Lasix which he is still taking. ?Discharge summary as follows: ?Physician Discharge Summary ?   ?Patient: David Strong MRN: [id number] DOB: [birth year]  ?Admit date:     11/22/2021  ?Discharge date: 12/18/21  ?Discharge Physician: Cordelia Poche, MD  ?  ?PCP: Horald Pollen, MD  ?  ?Recommendations at discharge:  ?  ?Hospital follow-up with PCP ?Follow-up with ID, CT surgery, Hematology/Oncology ?Outpatient IV antibiotics until 01/18/2022 ?  ?Discharge Diagnoses: ?Principal Problem: ?  Severe sepsis ([place]) ?Active Problems: ?  Pneumococcal bacteremia ?  CAP (community acquired pneumonia) ?  Septic arthritis ([place]) ?  DVT (deep venous thrombosis) ([place]) ?  High anion gap metabolic acidosis ?  Transaminitis ?  Thrombocytopenia ([place]) ?  Type 2 diabetes mellitus ([place]) ?  Hypokalemia ?  Hypophosphatemia ?  Constipation ?  Septic arthritis of left sternoclavicular joint ([place]) ?  Bilateral leg edema ?  Obese ?  Septic prepatellar bursitis of left  knee ?  Pyomyositis ?  Cellulitis ?  ?Resolved Problems: ?  * No resolved hospital problems. * ?  ?Hospital Course: ?David Strong is a 66 y.o. male with a history of diabetes mellitus type 2.  Patient presented secondary to productive cough, fever, chills, myalgias and arthralgias.  Patient was found to have multifocal pneumonia.  During work-up, patient developed evidence of severe sepsis and was found to have streptococcal bacteremia.  Patient was managed on IV antibiotics with development of septic arthritis of his left sternoclavicular joint.  Cardiothoracic surgery was consulted and performed I&D with wound VAC placement for treatment.  Endocarditis work-up was negative.  Patient with plan for 6 weeks of IV antibiotics total.  ?  ?Assessment and Plan: ?  ?Severe sepsis ?Does not appear to have been present on admission per H&P. Secondary to streptococcal infection as mentioned below. Patient empirically treated with Vancomycin, Cefepime and Azithromycin and transitioned to Ceftriaxone and finally to Penicillin G. Sepsis now resolved. ?  ?Streptococcal multifocal pneumonia ?Streptococcal bacteremia ?ID consulted with recommendations for 6 week total of IV antibiotics. Patient currently on Penicillin G BID. PICC line in place for outpatient antibiotics. Transthoracic Echocardiogram and Transesophageal Echocardiogram negative for valvular vegetations. Patient transitioned to Ceftriaxone 2g IV daily for outpatient regimen. End date of 01/18/2022. ?  ?Septic left sternoclavicular joint ?Septic arthritis ?Secondary to above infections. Patient underwent Left supraclavicular joint I&D of left sternoclavicular joint abscess and subsequent placement of a wound vac, per cardiothoracic surgery. Patient discharged with wound vac and outpatient follow-up. ?  ?Polyarthralgia ?Related to  infection and possible inflammatory changes to joints/cartilage. Improved. ?  ?Left knee abscesses ?Noted on MRI dated 4/22 multiple abscesses  noted. Orthopedic surgery consulted and recommend against I&D of abscesses at this time, and rather recommend continued antibiotic therapy. ?  ?Right shoulder myositis ?MRI performed on 4/11. Right shoulder with initial concern for septic joint. Orthopedic surgery consulted and performed aspiration which was dry. Per orthopedic, presentation consistent with myositis. ?  ?DVT ?Acute left LE involving left gastrocnemius and left posterior tibial veins. Age indeterminate DVT of left popliteal vein. Hematology consulted and attributed thromboses to sepsis. Patient initially managed on Heparin IV and has been transitioned to Eliquis. ?  ?Anasarca ?Likely related to poor albumin. Patient started on Lasix PO and now transitioned to Lasix IV. Weight down to 71.6 kg on day of discharge. ?  ?Elevated AST/ALT/alkaline phosphatase ?Mild. Asymptomatic. In setting of sepsis. GI consulted and have signed off. ?  ?High anion gap metabolic acidosis ?Lactic acidosis ?Secondary to acute illness/sepsis. Resolved with treatment. ?  ?Thrombocytopenia ?Present on admission with resolution. Possibly related to acute illness/sepsis. ?  ?Diabetes mellitus, type 2 ?Hemoglobin A1C of 8.6 on 11/23/21. Patient is on 70/30, Farxiga and Glipizide XL as an outpatient. Patient started on [place], Novolog for meal coverage and SSI this admission. Resume home regimen. ?  ?Hypokalemia ?Hypomagnesemia ?Hypophosphatemia ?Supplementation given with resolution. ?  ?Constipation ?Resolved with bowel regimen. ?  ?Hyponatremia ?Resolved. ?  ?Acute blood loss anemia ?Baseline hemoglobin around 14-15. Downward drift, likely secondary to perioperative blood loss. Hemoglobin currently stable at 8.6 - 9. ?  ?Increased nutrient needs ?Secondary to acute illness. Albumin undetectable. Registered dietitian consulted. ?Dietitian recommendations (4/26): ?Continue Ensure Max po BID, each supplement provides 150 kcal and 30 grams of protein ?Continue double protein  portions TID with meals ?Continue MVI with minerals daily ?Encourage PO intake ?  ?  ?  ?  ?Consultants:  ?Cardiothoracic surgery ?Infectious disease ?Orthopedic surgery ?Gastroenterology ?Hematology/oncology ?  ?Procedures performed:  ?Incision and drainage of left sternoclavicular joint abscess with excisional debridement and pulse lavage (4/17) ?Wound VAC change and excisional debridement of left sternoclavicular joint abscess (4/20)  ?Lab Results  ?Component Value Date  ? HGBA1C 8.6 (H) 11/23/2021  ? ? ? ?HPI ? ? ?Prior to Admission medications   ?Medication Sig Start Date End Date Taking? Authorizing Provider  ?acetaminophen (TYLENOL) 325 MG tablet Take 2 tablets (650 mg total) by mouth every 6 (six) hours as needed for mild pain, fever or headache (or Fever >/= 101). 12/02/21   Oswald Hillock, MD  ?apixaban (ELIQUIS) 5 MG TABS tablet Take 1 tablet (5 mg total) by mouth 2 (two) times daily. 12/18/21 03/18/22  Mariel Aloe, MD  ?benzonatate (TESSALON) 200 MG capsule Take 1 capsule (200 mg total) by mouth 3 (three) times daily as needed for cough. ?Patient not taking: Reported on 12/26/2021 12/02/21   Oswald Hillock, MD  ?Camphor-Eucalyptus-Menthol [facility] VAPORUB EX) Apply 1 application. topically daily as needed (chest congestion).    [provider]  ?cefTRIAXone (ROCEPHIN) IVPB Inject 2 g into the vein daily. Indication:   Strep pneumo bacteremia/septic arthritis ?First Dose: Yes ?Last Day of Therapy:  01/18/2022 ?Labs - Once weekly:  CBC/D and BMP, ?Labs - Every other week:  ESR and CRP ?Method of administration: IV Push ?Method of administration may be changed at the discretion of home infusion pharmacist based upon assessment of the patient and/or caregiver's ability to self-administer the medication ordered. 12/18/21 01/18/22  Nettey,  Evalee Jefferson, MD  ?furosemide (LASIX) 20 MG tablet Take 1 tablet (20 mg total) by mouth daily. 12/26/21   Melrose Nakayama, MD  ?glucose blood (ACCU-CHEK AVIVA PLUS) test strip  Use to test blood glucose daily. 07/20/21   Horald Pollen, MD  ?insulin isophane & regular human KwikPen (NOVOLIN 70/30 KWIKPEN) (70-30) 100 UNIT/ML KwikPen Inject 14 Units into the skin 2 (two) times da

## 2021-12-28 ENCOUNTER — Telehealth: Payer: Self-pay | Admitting: *Deleted

## 2021-12-28 ENCOUNTER — Ambulatory Visit: Payer: Medicare Other | Admitting: *Deleted

## 2021-12-28 ENCOUNTER — Encounter: Payer: Self-pay | Admitting: *Deleted

## 2021-12-28 ENCOUNTER — Ambulatory Visit: Payer: Medicare Other | Admitting: Thoracic Surgery (Cardiothoracic Vascular Surgery)

## 2021-12-28 DIAGNOSIS — E1165 Type 2 diabetes mellitus with hyperglycemia: Secondary | ICD-10-CM

## 2021-12-28 DIAGNOSIS — M009 Pyogenic arthritis, unspecified: Secondary | ICD-10-CM

## 2021-12-28 NOTE — Patient Instructions (Signed)
Visit Information ? ?Alfons, thank you for taking time to talk with me today. Please don't hesitate to contact me if I can be of assistance to you before our next scheduled telephone appointment ? ?Below are the goals we discussed today:  ?Patient Self-Care Activities: ?Patient David Strong will: ?Take medications as prescribed ?Attend all scheduled provider appointments ?Call pharmacy for medication refills ?Call provider office for new concerns or questions ?Continue to check fasting (first thing in the morning, before eating) blood sugars at home ?Continue to check evening blood sugars at home as you have been ?Continue to follow heart healthy, low salt, low cholesterol, carbohydrate-modified, low sugar diet-- I have included information about a foods that have protein in them, as you requested ? ?Our next scheduled telephone follow up visit/ appointment is scheduled on:   Our phone call was ended prior to our scheduling our next telephone visit; I will have my scheduler contact you to re-schedule a follow up visit ? ?If you need to cancel or re-schedule our visit, please call [phone] and our care guide team will be happy to assist you. ?  ?I look forward to hearing about your progress. ?  ?Caryl Pina, RN, BSN, CCRN Alumnus ?[facility] RN Care Coordination- [facility] ?([phone]: direct office ? ?If you are experiencing a Mental Health or Behavioral Health Crisis or need someone to talk to, please  ?call the Suicide and Crisis Lifeline: 988 ?call the [place] National Suicide Prevention Lifeline: 216 239 7583 or TTY: (408)661-9209 TTY (717) 557-6193) to talk to a trained counselor ?call 1-800-273-TALK (toll free, 24 hour hotline) ?go to [facility] [address] [place] [phone]) ?call 911  ? ?Protein Content in Foods ?Protein is a necessary nutrient in any diet. It helps build and repair muscles, bones, and skin. Depending on your overall health, you may  need more or less protein in your diet. You are encouraged to eat a variety of protein foods to ensure that you get all the essential nutrients that are found in different protein foods. Talk with your health care provider or dietitian about how much protein you need each day and which sources of protein are best for you. ?Protein is especially important for: ?Repairing and making cells and tissues. ?Fighting infection. ?Providing energy. ?Growth and development. ?See the following list for the protein content of some common foods. ?What are tips for getting more protein in your diet? ?Try to replace processed carbohydrates with high-quality protein. ?Snack on nuts and seeds instead of chips. ?Replace baked desserts with [place] yogurt. ?Eat protein foods from both plant and animal sources. ?Replace red meat with seafood. ?Add beans and peas to salads, soups, and side dishes. ?Include a protein food with each meal and snack. ?Reading food labels ?You can find the amount of protein in a food item by looking at the nutrition facts label. Use the total grams listed to help you reach your daily goal. ?What foods are high in protein? ? ?High-protein foods contain 4 grams (g) or more of protein per serving. They include: ?Grains ?Quinoa (cooked) -- 1 cup (185 g) has 8 g of protein. ?Whole wheat pasta (cooked) -- 1 cup (140 g) has 6 g of protein. ?Meat ?Beef, ground sirloin (cooked) -- 3 oz (85 g) has 24 g of protein. ?Chicken breast, boneless and skinless (cooked) -- 3 oz (85 g) has 25 g of protein. ?Egg -- 1 egg has 6 g of protein. ?Fish, filet (cooked) -- 1  oz (28 g) has 6-7 g of protein. ?Lamb (cooked) -- 3 oz (85 g) has 24 g of protein. ?Pork tenderloin (cooked) -- 3 oz (85 g) has 23 g of protein. ?Tuna (canned in water) -- 3 oz (85 g) has 20 g of protein. ?Dairy ?Cottage cheese -- ? cup (114 g) has 13.4 g of protein. ?Milk -- 1 cup (237 mL) has 8 g of protein. ?Cheese (hard) -- 1 oz (28 g) has 7 g of protein. ?Yogurt,  regular -- 6 oz (170 g) has 8 g of protein. ?[place] yogurt -- 6 oz (200 g) has 18 g protein. ?Plant protein ?Garbanzo beans (canned or cooked) -- ? cup (130 g) has 6-7 g of protein. ?Kidney beans (canned or cooked) -- ? cup (130 g) has 6-7 g of protein. ?Nuts (peanuts, pistachios, almonds) -- 1 oz (28 g) has 6 g of protein. ?Peanut butter -- 1 oz (32 g) has 7-8 g of protein. ?Pumpkin seeds -- 1 oz (28 g) has 8.5 g of protein. ?Soybeans (roasted) -- 1 oz (28 g) has 8 g of protein. ?Soybeans (cooked) -- ? cup (90 g) has 11 g of protein. ?Soy milk -- 1 cup (250 mL) has 5-10 g of protein. ?Soy or vegetable patty -- 1 patty has 11 g of protein. ?Sunflower seeds -- 1 oz (28 g) has 5.5 g of protein. ?Buckwheat -- 1 oz (33 g) has 4.3 g of protein. ?Tofu (firm) -- ? cup (124 g) has 20 g of protein. ?Tempeh -- ? cup (83 g) has 16 g of protein. ?The items listed above may not be a complete list of foods high in protein. Actual amounts of protein may differ depending on processing. Contact a dietitian for more information. ?What foods are low in protein? ? ?Low-protein foods contain 3 grams (g) or less of protein per serving. They include: ?Fruits ?Fruit or vegetable juice -- ? cup (125 mL) has 1 g of protein. ?Vegetables ?Beets (raw or cooked) -- ? cup (68 g) has 1.5 g of protein. ?Broccoli (raw or cooked) -- ? cup (44 g) has 2 g of protein. ?Collard greens (raw or cooked) -- ? cup (42 g) has 2 g of protein. ?Green beans (raw or cooked) -- ? cup (83 g) has 1 g of protein. ?Green peas (canned) -- ? cup (80 g) has 3.5 g of protein. ?Potato (baked with skin) -- 1 medium potato (173 g) has 3 g of protein. ?Spinach (cooked) -- ? cup (90 g) has 3 g of protein. ?Squash (cooked) -- ? cup (90 g) has 1.5 g of protein. ?Avocado -- 1 cup (146 g) has 2.7 g of protein. ?Grains ?Bran cereal -- ? cup (30 g) has 2-3 g of protein. ?Bread -- 1 slice has 2.5 g of protein. ?Corn (fresh or cooked) -- ? cup (77 g) has 2 g of protein. ?Flour tortilla  -- One 6-inch (15 cm) tortilla has 2.5 g of protein. ?Muffins -- 1 small muffin (2 oz or 57 g) has 3 g of protein. ?Oatmeal (cooked) -- ? cup (40 g) has 3 g of protein. ?Rice (cooked) -- ? cup (79 g) has 2.5-3.5 g of protein. ?Dairy ?Cream cheese -- 1 oz (29 g) has 2 g of protein. ?Creamer (half-and-half) -- 1 oz (29 mL) has 1 g of protein. ?Frozen yogurt -- ? cup (72 g) has 3 g of protein. ?Sour cream -- ? cup (75 g) has 2.5 g of protein. ?The items listed  above may not be a complete list of foods low in protein. Actual amounts of protein may differ depending on processing. Contact a dietitian for more information. ?Summary ?Protein is a nutrient that your body needs for growth and development, repairing and making cells and tissues, fighting infection, and providing energy. ?Protein is in both plant and animal foods. Some of these foods have more protein than others. ?Depending on your overall health, you may need more or less protein in your diet. Talk to your health care provider about how much protein you need. ?This information is not intended to replace advice given to you by your health care provider. Make sure you discuss any questions you have with your health care provider. ?Document Revised: 07/11/2020 Document Reviewed: 07/11/2020 ?Elsevier Patient Education ? 2023 Elsevier [employer]. ? ?Following is a copy of your full plan of care:  ?Care Plan : RN Care Manager Plan of Care  ?Updates made by Michaela Cornerousey, Jennier Schissler M, RN since 12/28/2021 12:00 AM  ?  ? ?Problem: Chronic Disease Management Needs   ?Priority: High  ?  ? ?Long-Range Goal: Development of plan of care for long term chronic disease management   ?Start Date: 12/28/2021  ?Expected End Date: 12/29/2022  ?Priority: High  ?Note:   ?Current Barriers:  ?Chronic Disease Management support and education needs related to DMII ?Recent extended hospitalization April 5- Dec 18, 2021 for sepsis/ CAP; septic arthritis/ abscess; DVT ?Discharged home to self-care with home  health services for IV infusion/ wound vac ? ?RNCM Clinical Goal(s):  ?Patient will demonstrate ongoing health management independence as evidenced by DMII; post-hospital discharge recuperation        th

## 2021-12-28 NOTE — Chronic Care Management (AMB) (Signed)
? Care Management ?  ? RN Visit Note ? ?12/28/2021 ?Name: David Strong MRN: [id number] DOB: 1956/03/19 ? ?Subjective: ?David Strong is a 66 y.o. year old male who is a primary care patient of Mitchel Honour, Ines Bloomer, MD. The care management team was consulted for assistance with disease management and care coordination needs.   ? ?Engaged with patient by telephone for initial visit in response to provider referral for case management and/or care coordination services.  ? ?Consent to Services:  ? David Strong was given information about Care Management services 12/20/21 including:  ?Care Management services includes personalized support from designated clinical staff supervised by his physician, including individualized plan of care and coordination with other care providers ?24/7 contact phone numbers for assistance for urgent and routine care needs. ?The patient may stop case management services at any time by phone call to the office staff. ? ?Patient agreed to services and consent obtained.  ? ?Assessment: Review of patient past medical history, allergies, medications, health status, including review of consultants reports, laboratory and other test data, was performed as part of comprehensive evaluation and provision of chronic care management services.  ? ?SDOH (Social Determinants of Health) assessments and interventions performed:  ?SDOH Interventions   ? ?Flowsheet Row Most Recent Value  ?SDOH Interventions   ?Food Insecurity Interventions Intervention Not Indicated  [denies food insecurity]  ?Housing Interventions Intervention Not Indicated  [reports lives with spouse and one adult child/ one dependent child, in 3-story single family home that he owns,  denies concerns aorund housing]  ?Transportation Interventions Intervention Not Indicated  [reports spouse currently providing transportation as he recuperates from recent hospitalization,  hoping to resume driving once he is feeling better]  ? ?  ?Care  Plan ? ?Allergies  ?Allergen Reactions  ? Codeine Itching  ? Glipizide Er [Glipizide]   ?  SOB, malaise. He tolerates XL without issues.  ? Metformin And Related Rash  ? ?Outpatient Encounter Medications as of 12/28/2021  ?Medication Sig  ? acetaminophen (TYLENOL) 325 MG tablet Take 2 tablets (650 mg total) by mouth every 6 (six) hours as needed for mild pain, fever or headache (or Fever >/= 101).  ? apixaban (ELIQUIS) 5 MG TABS tablet Take 1 tablet (5 mg total) by mouth 2 (two) times daily.  ? benzonatate (TESSALON) 200 MG capsule Take 1 capsule (200 mg total) by mouth 3 (three) times daily as needed for cough. (Patient not taking: Reported on 12/26/2021)  ? Camphor-Eucalyptus-Menthol (VICKS VAPORUB EX) Apply 1 application. topically daily as needed (chest congestion).  ? cefTRIAXone (ROCEPHIN) IVPB Inject 2 g into the vein daily. Indication:   Strep pneumo bacteremia/septic arthritis ?First Dose: Yes ?Last Day of Therapy:  01/18/2022 ?Labs - Once weekly:  CBC/D and BMP, ?Labs - Every other week:  ESR and CRP ?Method of administration: IV Push ?Method of administration may be changed at the discretion of home infusion pharmacist based upon assessment of the patient and/or caregiver's ability to self-administer the medication ordered.  ? dapagliflozin propanediol (FARXIGA) 10 MG TABS tablet Take 10 mg by mouth daily.  ? furosemide (LASIX) 20 MG tablet Take 1 tablet (20 mg total) by mouth daily.  ? glipiZIDE (GLUCOTROL) 10 MG tablet Take 20 mg by mouth 2 (two) times daily.  ? glucose blood (ACCU-CHEK AVIVA PLUS) test strip Use to test blood glucose daily.  ? Insulin Pen Needle (PEN NEEDLES 3/16") 31G X 5 MM MISC 14 Units by Does not apply route 2 (two) times daily with  a meal.  ? oxyCODONE (OXY IR/ROXICODONE) 5 MG immediate release tablet Take 1 tablet (5 mg total) by mouth 3 (three) times daily as needed for moderate pain or severe pain.  ? Phenol (EQ SORE THROAT SPRAY MT) Use as directed 1 spray in the mouth or throat  daily as needed (sore throat).  ? polyethylene glycol (MIRALAX / GLYCOLAX) 17 g packet Take 17 g by mouth daily as needed.  ? potassium chloride (KLOR-CON M) 10 MEQ tablet Take 1 tablet (10 mEq total) by mouth daily.  ? sildenafil (VIAGRA) 100 MG tablet Take 0.5-1 tablets (50-100 mg total) by mouth daily as needed for erectile dysfunction.  ? ?No facility-administered encounter medications on file as of 12/28/2021.  ? ?Patient Active Problem List  ? Diagnosis Date Noted  ? Septic prepatellar bursitis of left knee   ? Pyomyositis   ? Cellulitis   ? Obese 12/05/2021  ? Bilateral leg edema 11/28/2021  ? DVT (deep venous thrombosis) ([place]) 11/27/2021  ? Constipation 11/27/2021  ? Septic arthritis of left sternoclavicular joint ([place])   ? Severe sepsis ([place]) 11/23/2021  ? Pneumococcal bacteremia 11/23/2021  ? Hypokalemia 11/23/2021  ? Hypophosphatemia 11/23/2021  ? CAP (community acquired pneumonia) 11/22/2021  ? High anion gap metabolic acidosis 25/95/6387  ? Septic arthritis ([place]) 11/22/2021  ? Transaminitis 11/22/2021  ? Right inguinal hernia 10/12/2021  ? Type 2 diabetes mellitus ([place]) 06/07/2015  ? Glucose intolerance (impaired glucose tolerance) 08/03/2012  ? Leucopenia 08/03/2012  ? Thrombocytopenia ([place]) 08/03/2012  ? ?Conditions to be addressed/monitored: DMII and post-recent hospitalization for sepsis ? ?Care Plan : RN Care Manager Plan of Care  ?Updates made by Knox Royalty, RN since 12/28/2021 12:00 AM  ?  ? ?Problem: Chronic Disease Management Needs   ?Priority: High  ?  ? ?Long-Range Goal: Development of plan of care for long term chronic disease management   ?Start Date: 12/28/2021  ?Expected End Date: 12/29/2022  ?Priority: High  ?Note:   ?Current Barriers:  ?Chronic Disease Management support and education needs related to DMII ?Recent extended hospitalization April 5- Dec 18, 2021 for sepsis/ CAP; septic arthritis/ abscess; DVT ?Discharged home to self-care with home health services for IV infusion/ wound  vac ? ?RNCM Clinical Goal(s):  ?Patient will demonstrate ongoing health management independence as evidenced by DMII; post-hospital discharge recuperation        through collaboration with RN Care manager, provider, and care team.  ? ?Interventions: ?1:1 collaboration with primary care provider regarding development and update of comprehensive plan of care as evidenced by provider attestation and co-signature ?Inter-disciplinary care team collaboration (see longitudinal plan of care) ?Evaluation of current treatment plan related to  self management and patient's adherence to plan as established by provider ?Review of patient status, including review of consultants reports, relevant laboratory and other test results, and medications completed ?Explained role of Clinic RN CM: patient appears confused as to the purpose of my call today; he is asking what good it is for me to contact him by phone if I am not going to come to his home to check on him; I attempted to explain role of Clinic RN CM; patient states he is not sure there is value in ongoing outreach from RN CM by telephone, I explained that participation in the care management program is completely voluntary; today, he states he will proceed with our call  ?Toward the end of our phone call today, call was either dropped or patient hung up prior to  scheduling next phone call appointment- I attempted to call him immediately back x 2; he did not answer my subsequent phone call attempts ?SDOH assessment completed: no unmet concerns identified ?Depression screening completed: no concerns identified ?Pain assessment updated: reports intermittent pain in "swollen feet" that is well controlled with use of prescribed medications; denies pain during our conversation today ?Falls assessment updated: denies new/ recent falls x 12 months- currently not using assistive devices;  positive reinforcement provided with encouragement to continue efforts at fall prevention;  previously provided education around fall risks/ prevention reinforced ?Medications discussed: today, he reports he independently self-manages medications and denies current concerns/ issues/ questions around medica

## 2021-12-28 NOTE — Telephone Encounter (Addendum)
?  Care Management  ? ?Follow Up Note ? ? ?12/28/2021 ?Name: David Strong MRN: [id number] DOB: Nov 05, 1955 ? ?Referred by: Horald Pollen, MD ?Reason for referral : Care Coordination (Clinic RN CM Initial Outreach attempt- Unsuccessful) ? ?An unsuccessful telephone outreach was attempted today. The patient was referred to the case management team for assistance with care management and care coordination.  ? ?Follow Up Plan:  ?A HIPPA compliant phone message x 2 was left for the patient providing contact information and requesting a return call ?Will place request with scheduling care guide to contact patient to re-schedule today's missed CCM RN initial telephone appointment if I do not hear back from patient by end of day ?Placed third outreach attempt which was successfully completed- please see additional/ separate note for details of call ? ?Oneta Rack, RN, BSN, CCRN Alumnus ?[place] ?([phone]: direct office ? ? ?

## 2021-12-29 ENCOUNTER — Inpatient Hospital Stay: Payer: Medicare Other | Admitting: Internal Medicine

## 2021-12-29 ENCOUNTER — Telehealth: Payer: Self-pay

## 2021-12-29 ENCOUNTER — Encounter: Payer: Self-pay | Admitting: Internal Medicine

## 2021-12-29 ENCOUNTER — Ambulatory Visit: Payer: Medicare Other | Admitting: Internal Medicine

## 2021-12-29 ENCOUNTER — Other Ambulatory Visit: Payer: Self-pay

## 2021-12-29 VITALS — BP 121/85 | HR 118 | Temp 97.7°F | Ht 64.0 in | Wt 152.0 lb

## 2021-12-29 DIAGNOSIS — M0019 Pneumococcal polyarthritis: Secondary | ICD-10-CM

## 2021-12-29 DIAGNOSIS — A403 Sepsis due to Streptococcus pneumoniae: Secondary | ICD-10-CM

## 2021-12-29 DIAGNOSIS — M869 Osteomyelitis, unspecified: Secondary | ICD-10-CM | POA: Diagnosis not present

## 2021-12-29 MED ORDER — CEFADROXIL 500 MG PO CAPS: 500.0000 mg | ORAL_CAPSULE | Freq: Two times a day (BID) | ORAL | 0 refills | Status: DC

## 2021-12-29 NOTE — Telephone Encounter (Signed)
Epic community message sent to [facility], and copied to [facility] clinical pharmacists. ? ?Per Dr. Renold Don verbal order and visit note today, please pull PICC on 01/18/22, after completion of last dose of IV ceftriaxone. ? ?Wyvonne Lenz, RN  ?

## 2021-12-29 NOTE — Patient Instructions (Signed)
Please see your surgeon to address the shoulder infection/wound vac ? ? ?On 6/1 you'll finish ceftriaxone iv, but start cefadroxil pills antibiotics (2 pills twice a day) ? ? ?See me in around 4 weeks ? ? ?Will make sure your inflammation number is normal/improving and clinically no other concern on exam for infection before we stop the antibiotics all together ? ? ? ?Start wearing compression stocking to reduce swelling complication ? ? ?If your left shoulder hurts more and you have fever, chill, malaise, let me know sooner ?

## 2021-12-29 NOTE — Progress Notes (Signed)
? ? ? ? ? ?CC - f/u hospital discharge 5/12 for strep pna bacteremia and polyarthritis septic and pyomyositis/abscess ? ?HPI: ?66 yo male admitted early 11/2021 for sepsis with strep pna bsi and polyarthritis ?He had progressive left [place] joint necrosis/infection and soft tissue abscess and undergone I&D there by cts on 4/17 ?He has involvement of left knee and right shoulder as well ?Endocarditis r/o ?He was sent home on ceftriaxone for ease of administration ? ?He has LUE picc ? ?He has no n/v/diarrhea; no fever chill ? ?He has LLE dvt during admission as well and is on anticoagulation eliquis ? ?He has bilateral L>>R edema ? ?He has no further pain in the right shoulder or the left knee. ? ?He has wound vac on the left [place] joint and mild discomfort left upper shoulder ? ?He is eating well gaining weight back ? ? ?Reviewed opat labs; crp still high ? ? ?He complains of upper/posterior left shoulder discomfort but better than hospital time ? ?Objective: ? ?Vitals:  ? 12/29/21 1548  ?BP: 121/85  ?Pulse: (!) 118  ?Temp: 97.7 ?F (36.5 ?C)  ?SpO2: 97%  ? ? ? ?Physical Exam: ?General/constitutional: no distress, pleasant ?HEENT: Normocephalic, PER, Conj Clear, EOMI, Oropharynx clear ?Neck supple ?CV: rrr no mrg ?Lungs: clear to auscultation, normal respiratory effort ?Abd: Soft, Nontender ?Ext: bilateral L>R edema ?Skin: No Rash ?Neuro: nonfocal ?MSK: left [place] joint with wound; intact rom left shoulder; only able to abduct right shoulder to 20-30 degree; nontender bilateral shoulder joint/neck; full rom bilateral knee without effusion/tenderness; back nontender ? ?Central line presence: lue picc site no tenderness or purulence ? ? ?CBC: ?Lab Results  ?Component Value Date  ? WBC 4.5 12/27/2021  ? HGB 10.7 (L) 12/27/2021  ? HCT 31.6 (L) 12/27/2021  ? MCV 84.6 12/27/2021  ? PLT 300.0 12/27/2021  ? ? ? ? ?BMET ?Last metabolic panel ?Lab Results  ?Component Value Date  ? GLUCOSE 312 (H) 12/27/2021  ? NA 134 (L) 12/27/2021  ? K  4.2 12/27/2021  ? CL 96 12/27/2021  ? CO2 29 12/27/2021  ? BUN 8 12/27/2021  ? CREATININE 0.63 12/27/2021  ? GFRNONAA >60 12/15/2021  ? CALCIUM 8.9 12/27/2021  ? PHOS 3.1 12/15/2021  ? PROT 7.4 12/27/2021  ? ALBUMIN 3.2 (L) 12/27/2021  ? LABGLOB 2.3 04/07/2020  ? AGRATIO 2.0 04/07/2020  ? BILITOT 0.4 12/27/2021  ? ALKPHOS 156 (H) 12/27/2021  ? AST 14 12/27/2021  ? ALT 19 12/27/2021  ? ANIONGAP 6 12/15/2021  ? ?12/25/21 opat labs ?5/8 WBC 4.2, SCr 0.7, ESR 89, CRP 72 ? ?Crp: ?5/08    72 (<8) ?4/25    15 (<1) ?4/06    43 ? ?Micro Results: ?Recent Results (from the past 720 hour(s))  ?Blood culture (routine x 2)     Status: Abnormal  ? Collection Time: 11/22/21  4:10 PM  ? Specimen: BLOOD  ?Result Value Ref Range Status  ? Specimen Description   Final  ?  BLOOD BLOOD LEFT FOREARM ?Performed at [facility], [place] [address] [place], [place] [address] ?  ? Special Requests   Final  ?  BOTTLES DRAWN AEROBIC AND ANAEROBIC Blood Culture results may not be optimal due to an inadequate volume of blood received in culture bottles ?Performed at [facility], [place] [address] [place], [place] [address] ?  ? Culture  Setup Time   Final  ?  GRAM POSITIVE COCCI ?IN BOTH AEROBIC AND ANAEROBIC BOTTLES ?  Organism ID to follow ?CRITICAL RESULT CALLED TO, READ BACK BY AND VERIFIED WITH: N. Kelso, AT [phone] 11/23/21 D. VANHOOK ?Performed at [facility], [place] [address] [place], [place] [address] ?  ? Culture STREPTOCOCCUS PNEUMONIAE (A)  Final  ? Report Status 11/25/2021 FINAL  Final  ? Organism ID, Bacteria STREPTOCOCCUS PNEUMONIAE  Final  ?    Susceptibility  ? Streptococcus pneumoniae - MIC*  ?  ERYTHROMYCIN >=8 RESISTANT Resistant   ?  LEVOFLOXACIN 0.5 SENSITIVE Sensitive   ?  VANCOMYCIN 0.5 SENSITIVE Sensitive   ?  PENICILLIN (meningitis) <=0.06 SENSITIVE Sensitive   ?  PENO - penicillin <=0.06    ?  PENICILLIN (non-meningitis) <=0.06 SENSITIVE Sensitive   ?  PENICILLIN (oral) <=0.06  SENSITIVE Sensitive   ?  CEFTRIAXONE (non-meningitis) <=0.12 SENSITIVE Sensitive   ?  CEFTRIAXONE (meningitis) <=0.12 SENSITIVE Sensitive   ?  * STREPTOCOCCUS PNEUMONIAE  ?Blood Culture ID Panel (Reflexed)     Status: Abnormal  ? Collection Time: 11/22/21  4:10 PM  ?Result Value Ref Range Status  ? Enterococcus faecalis NOT DETECTED NOT DETECTED Final  ? Enterococcus Faecium NOT DETECTED NOT DETECTED Final  ? Listeria monocytogenes NOT DETECTED NOT DETECTED Final  ? Staphylococcus species NOT DETECTED NOT DETECTED Final  ? Staphylococcus aureus (BCID) NOT DETECTED NOT DETECTED Final  ? Staphylococcus epidermidis NOT DETECTED NOT DETECTED Final  ? Staphylococcus lugdunensis NOT DETECTED NOT DETECTED Final  ? Streptococcus species DETECTED (A) NOT DETECTED Final  ?  Comment: CRITICAL RESULT CALLED TO, READ BACK BY AND VERIFIED WITH: Gu[place]lupe Spanish PHARMD, AT [phone] 11/23/21 D. VANHOOK ?  ? Streptococcus agalactiae NOT DETECTED NOT DETECTED Final  ? Streptococcus pneumoniae DETECTED (A) NOT DETECTED Final  ?  Comment: CRITICAL RESULT CALLED TO, READ BACK BY AND VERIFIED WITH: ?N. Reader, AT [phone] 11/23/21 D. VANHOOK ?  ? Streptococcus pyogenes NOT DETECTED NOT DETECTED Final  ? A.calcoaceticus-baumannii NOT DETECTED NOT DETECTED Final  ? Bacteroides fragilis NOT DETECTED NOT DETECTED Final  ? Enterobacterales NOT DETECTED NOT DETECTED Final  ? Enterobacter cloacae complex NOT DETECTED NOT DETECTED Final  ? Escherichia coli NOT DETECTED NOT DETECTED Final  ? Klebsiella aerogenes NOT DETECTED NOT DETECTED Final  ? Klebsiella oxytoca NOT DETECTED NOT DETECTED Final  ? Klebsiella pneumoniae NOT DETECTED NOT DETECTED Final  ? Proteus species NOT DETECTED NOT DETECTED Final  ? Salmonella species NOT DETECTED NOT DETECTED Final  ? Serratia marcescens NOT DETECTED NOT DETECTED Final  ? Haemophilus influenzae NOT DETECTED NOT DETECTED Final  ? Neisseria meningitidis NOT DETECTED NOT DETECTED Final  ? Pseudomonas aeruginosa NOT  DETECTED NOT DETECTED Final  ? Stenotrophomonas maltophilia NOT DETECTED NOT DETECTED Final  ? Candida albicans NOT DETECTED NOT DETECTED Final  ? Candida auris NOT DETECTED NOT DETECTED Final  ? Candida glabrata NOT DETECTED NOT DETECTED Final  ? Candida krusei NOT DETECTED NOT DETECTED Final  ? Candida parapsilosis NOT DETECTED NOT DETECTED Final  ? Candida tropicalis NOT DETECTED NOT DETECTED Final  ? Cryptococcus neoformans/gattii NOT DETECTED NOT DETECTED Final  ?  Comment: Performed at [facility], [address], [place], [place] [address]  ?Blood culture (routine x 2)     Status: Abnormal  ? Collection Time: 11/22/21  4:15 PM  ? Specimen: BLOOD  ?Result Value Ref Range Status  ? Specimen Description   Final  ?  BLOOD BLOOD LEFT FOREARM ?Performed at [facility], [place] [address] [place],  [place] [address] ?  ? Special Requests   Final  ?  BOTTLES DRAWN AEROBIC AND ANAEROBIC Blood Culture results may not be optimal due to an inadequate volume of blood received in culture bottles ?Performed at [facility], [place] [address], [place], [place] [address] ?  ? Culture  Setup Time   Final  ?  GRAM POSITIVE COCCI IN CHAINS ?IN BOTH AEROBIC AND ANAEROBIC BOTTLES ?CRITICAL VALUE NOTED.  VALUE IS CONSISTENT WITH PREVIOUSLY REPORTED AND CALLED VALUE. ?  ? Culture (A)  Final  ?  STREPTOCOCCUS PNEUMONIAE ?SUSCEPTIBILITIES PERFORMED ON PREVIOUS CULTURE WITHIN THE LAST 5 DAYS. ?Performed at [facility], [place] [address], [place], [place] [address] ?  ? Report Status 11/25/2021 FINAL  Final  ?Resp Panel by RT-PCR (Flu A&B, Covid)     Status: None  ? Collection Time: 11/22/21  4:25 PM  ? Specimen: Nasopharyngeal(NP) swabs in vial transport medium  ?Result Value Ref Range Status  ? SARS Coronavirus 2 by RT PCR NEGATIVE NEGATIVE Final  ?  Comment: (NOTE) ?SARS-CoV-2 target nucleic acids are NOT DETECTED. ? ?The SARS-CoV-2 RNA is generally detectable in upper respiratory ?specimens  during the acute phase of infection. The lowest ?concentration of SARS-CoV-2 viral copies this assay can detect is ?138 copies/mL. A negative result does not preclude SARS-Cov-2 ?infection and should

## 2021-12-30 DIAGNOSIS — R6520 Severe sepsis without septic shock: Secondary | ICD-10-CM | POA: Diagnosis not present

## 2021-12-30 DIAGNOSIS — A419 Sepsis, unspecified organism: Secondary | ICD-10-CM | POA: Diagnosis not present

## 2021-12-30 DIAGNOSIS — R7881 Bacteremia: Secondary | ICD-10-CM | POA: Diagnosis not present

## 2022-01-01 DIAGNOSIS — R7881 Bacteremia: Secondary | ICD-10-CM | POA: Diagnosis not present

## 2022-01-01 DIAGNOSIS — A419 Sepsis, unspecified organism: Secondary | ICD-10-CM | POA: Diagnosis not present

## 2022-01-01 DIAGNOSIS — R6520 Severe sepsis without septic shock: Secondary | ICD-10-CM | POA: Diagnosis not present

## 2022-01-02 ENCOUNTER — Telehealth: Payer: Self-pay | Admitting: *Deleted

## 2022-01-02 DIAGNOSIS — A419 Sepsis, unspecified organism: Secondary | ICD-10-CM | POA: Diagnosis not present

## 2022-01-02 DIAGNOSIS — R7881 Bacteremia: Secondary | ICD-10-CM | POA: Diagnosis not present

## 2022-01-02 DIAGNOSIS — R6520 Severe sepsis without septic shock: Secondary | ICD-10-CM | POA: Diagnosis not present

## 2022-01-02 NOTE — Telephone Encounter (Signed)
Patient's HHRN called stating patient's wound is too shallow for a wound vac sponge. Orders provided for wet to dry dressing changes BID. Called patient to let him know. Patient states he is unable to do dressing changes himself and requests help. HHRN unable to go back out for education and dressing change until Friday. Patient scheduled to come into the office tomorrow for a nurse visit for dressing change and education. Patient verbalized understanding.  ?

## 2022-01-03 ENCOUNTER — Other Ambulatory Visit: Payer: Self-pay | Admitting: Thoracic Surgery (Cardiothoracic Vascular Surgery)

## 2022-01-03 ENCOUNTER — Ambulatory Visit: Payer: Medicare Other

## 2022-01-03 DIAGNOSIS — Z4801 Encounter for change or removal of surgical wound dressing: Secondary | ICD-10-CM

## 2022-01-03 MED ORDER — OXYCODONE HCL 5 MG PO TABS: 5.0000 mg | ORAL_TABLET | Freq: Three times a day (TID) | ORAL | 0 refills | Status: DC | PRN

## 2022-01-03 NOTE — Progress Notes (Signed)
Refilled oxycodone presription ? ?Salvatore Decent Dorris Fetch, MD ?[facility] ?([phone] ? ?

## 2022-01-03 NOTE — Progress Notes (Signed)
left sternoclavicular surgical wound. Wound vac was removed by Home Health nurse yesterday. She applied a Vaseline dressing and covered with Tegaderm. Today came to the office for dressing change. Applied wet to dry dressing to the area. 2 x 2 damp with NS and covered with 4 x 4 and tape. Supplies given to the pt and told to change the site twice a day. He will return tomorrow to see PA for wound check ?

## 2022-01-04 ENCOUNTER — Telehealth: Payer: Self-pay | Admitting: *Deleted

## 2022-01-04 ENCOUNTER — Ambulatory Visit: Payer: Medicare Other | Admitting: Surgical

## 2022-01-04 VITALS — BP 122/85 | HR 102 | Temp 97.5°F | Resp 20 | Wt 148.0 lb

## 2022-01-04 DIAGNOSIS — Z09 Encounter for follow-up examination after completed treatment for conditions other than malignant neoplasm: Secondary | ICD-10-CM | POA: Diagnosis not present

## 2022-01-04 DIAGNOSIS — M71062 Abscess of bursa, left knee: Secondary | ICD-10-CM | POA: Diagnosis not present

## 2022-01-04 DIAGNOSIS — Z794 Long term (current) use of insulin: Secondary | ICD-10-CM | POA: Diagnosis not present

## 2022-01-04 DIAGNOSIS — I82442 Acute embolism and thrombosis of left tibial vein: Secondary | ICD-10-CM | POA: Diagnosis not present

## 2022-01-04 DIAGNOSIS — Z79891 Long term (current) use of opiate analgesic: Secondary | ICD-10-CM | POA: Diagnosis not present

## 2022-01-04 DIAGNOSIS — M60111 Interstitial myositis, right shoulder: Secondary | ICD-10-CM | POA: Diagnosis not present

## 2022-01-04 DIAGNOSIS — A409 Streptococcal sepsis, unspecified: Secondary | ICD-10-CM | POA: Diagnosis not present

## 2022-01-04 DIAGNOSIS — Z452 Encounter for adjustment and management of vascular access device: Secondary | ICD-10-CM | POA: Diagnosis not present

## 2022-01-04 DIAGNOSIS — M009 Pyogenic arthritis, unspecified: Secondary | ICD-10-CM | POA: Diagnosis not present

## 2022-01-04 DIAGNOSIS — Z7901 Long term (current) use of anticoagulants: Secondary | ICD-10-CM | POA: Diagnosis not present

## 2022-01-04 DIAGNOSIS — E119 Type 2 diabetes mellitus without complications: Secondary | ICD-10-CM | POA: Diagnosis not present

## 2022-01-04 DIAGNOSIS — D696 Thrombocytopenia, unspecified: Secondary | ICD-10-CM | POA: Diagnosis not present

## 2022-01-04 DIAGNOSIS — E876 Hypokalemia: Secondary | ICD-10-CM | POA: Diagnosis not present

## 2022-01-04 DIAGNOSIS — I824Z2 Acute embolism and thrombosis of unspecified deep veins of left distal lower extremity: Secondary | ICD-10-CM | POA: Diagnosis not present

## 2022-01-04 DIAGNOSIS — Z792 Long term (current) use of antibiotics: Secondary | ICD-10-CM | POA: Diagnosis not present

## 2022-01-04 DIAGNOSIS — I82432 Acute embolism and thrombosis of left popliteal vein: Secondary | ICD-10-CM | POA: Diagnosis not present

## 2022-01-04 LAB — FUNGUS CULTURE RESULT

## 2022-01-04 LAB — FUNGUS CULTURE WITH STAIN

## 2022-01-04 LAB — FUNGAL ORGANISM REFLEX

## 2022-01-04 NOTE — Progress Notes (Signed)
[address]       [place] [address]             [phone]      Chesney Mittal [place] Medical Record #[id number] Date of Birth: 02/12/1956  Referring: Horald Pollen, * Primary Care: Horald Pollen, MD Primary Cardiologist: None   Chief Complaint:   POST OP FOLLOW UP Operative Report    DATE OF PROCEDURE: 12/04/2021   PREOPERATIVE DIAGNOSIS:  Sternoclavicular joint abscess with extension into the neck and chest wall.   POSTOPERATIVE DIAGNOSIS:  Sternoclavicular joint abscess with extension into the neck and chest wall.   PROCEDURE:  Incision and drainage of left sternoclavicular joint abscess with excisional debridement and pulse lavage.   SURGEON:  Revonda Standard. Roxan Hockey, MD History of Present Illness: We continue to follow this 66 year old gentleman for wound care of the sternoclavicular joint abscess.  He is seen in the office for ongoing surveillance.  The wound is tender but overall he feels pretty well.  He continues to get dressing changes twice weekly by the home nurse as well as coming to the office on other days.  He continues IV antibiotics.  He is not having any fevers or other significant constitutional symptoms.      Past Medical History:  Diagnosis Date   Diabetes mellitus without complication ([place])      Social History   Tobacco Use  Smoking Status Former  Smokeless Tobacco Never    Social History   Substance and Sexual Activity  Alcohol Use Not Currently   Comment: occasional     Allergies  Allergen Reactions   Codeine Itching   Glipizide Er [Glipizide]     SOB, malaise. He tolerates XL without issues.   Metformin And Related Rash    Current Outpatient Medications  Medication Sig Dispense Refill   acetaminophen (TYLENOL) 325 MG tablet Take 2 tablets (650 mg total) by mouth every 6 (six) hours as needed for mild pain, fever or headache (or Fever >/= 101). (Patient not taking: Reported on 12/29/2021)      apixaban (ELIQUIS) 5 MG TABS tablet Take 1 tablet (5 mg total) by mouth 2 (two) times daily. 60 tablet 2   benzonatate (TESSALON) 200 MG capsule Take 1 capsule (200 mg total) by mouth 3 (three) times daily as needed for cough. (Patient not taking: Reported on 12/26/2021) 20 capsule 0   Camphor-Eucalyptus-Menthol (VICKS VAPORUB EX) Apply 1 application. topically daily as needed (chest congestion). (Patient not taking: Reported on 12/29/2021)     cefadroxil (DURICEF) 500 MG capsule Take 1 capsule (500 mg total) by mouth 2 (two) times daily. Start 6/02 when done with iv ceftriaxone 60 capsule 0   cefTRIAXone (ROCEPHIN) IVPB Inject 2 g into the vein daily. Indication:   Strep pneumo bacteremia/septic arthritis First Dose: Yes Last Day of Therapy:  01/18/2022 Labs - Once weekly:  CBC/D and BMP, Labs - Every other week:  ESR and CRP Method of administration: IV Push Method of administration may be changed at the discretion of home infusion pharmacist based upon assessment of the patient and/or caregiver's ability to self-administer the medication ordered. 32 Units 0   dapagliflozin propanediol (FARXIGA) 10 MG TABS tablet Take 10 mg by mouth daily.     furosemide (LASIX) 20 MG tablet Take 1 tablet (20 mg total) by mouth daily. 14 tablet 1   glipiZIDE (GLUCOTROL) 10 MG tablet Take 20 mg by mouth 2 (two) times daily.  glucose blood (ACCU-CHEK AVIVA PLUS) test strip Use to test blood glucose daily. 100 each 3   Insulin Pen Needle (PEN NEEDLES 3/16") 31G X 5 MM MISC 14 Units by Does not apply route 2 (two) times daily with a meal. 50 each 3   oxyCODONE (OXY IR/ROXICODONE) 5 MG immediate release tablet Take 1 tablet (5 mg total) by mouth 3 (three) times daily as needed for moderate pain or severe pain. 20 tablet 0   Phenol (EQ SORE THROAT SPRAY MT) Use as directed 1 spray in the mouth or throat daily as needed (sore throat). (Patient not taking: Reported on 12/29/2021)     polyethylene glycol (MIRALAX /  GLYCOLAX) 17 g packet Take 17 g by mouth daily as needed. (Patient not taking: Reported on 12/29/2021) 14 each 0   potassium chloride (KLOR-CON M) 10 MEQ tablet Take 1 tablet (10 mEq total) by mouth daily. 14 tablet 1   sildenafil (VIAGRA) 100 MG tablet Take 0.5-1 tablets (50-100 mg total) by mouth daily as needed for erectile dysfunction. 5 tablet 11   No current facility-administered medications for this visit.       Physical Exam: There were no vitals taken for this visit.  Wound: The wound has a good granulation base but there is some fatty necrosis.  I felt that this would benefit from some local debridement.   Diagnostic Studies & Laboratory data:     Recent Radiology Findings:   No results found.    Recent Lab Findings: Lab Results  Component Value Date   WBC 4.5 12/27/2021   HGB 10.7 (L) 12/27/2021   HCT 31.6 (L) 12/27/2021   PLT 300.0 12/27/2021   GLUCOSE 312 (H) 12/27/2021   CHOL 168 10/12/2021   TRIG 104.0 10/12/2021   HDL 44.10 10/12/2021   LDLDIRECT 116.0 03/28/2021   LDLCALC 103 (H) 10/12/2021   ALT 19 12/27/2021   AST 14 12/27/2021   NA 134 (L) 12/27/2021   K 4.2 12/27/2021   CL 96 12/27/2021   CREATININE 0.63 12/27/2021   BUN 8 12/27/2021   CO2 29 12/27/2021   TSH 3.110 05/06/2017   INR 1.5 (H) 12/04/2021   HGBA1C 8.6 (H) 11/23/2021   Brief procedure:  After Betadine prep and local infiltration with 1% lidocaine I debrided the wound gently of some fatty necrotic tissue.  He tolerated the procedure well.  The granulation tissue did have some bleeding but it was controlled with the wet-to-dry dressing.   Assessment / Plan: The wound is overall healing well with at least 85% granulation tissue.  Continue ongoing local wound care as currently scheduled.  He will be seen in the office by one of the providers in 2 weeks.      Medication Changes: No orders of the defined types were placed in this encounter.     John Giovanni, PA-C  01/04/2022 2:58  PM

## 2022-01-04 NOTE — Patient Instructions (Signed)
Continue current wound care and IV antibiotics as scheduled

## 2022-01-04 NOTE — Chronic Care Management (AMB) (Signed)
  Care Coordination Note  01/04/2022 Name: [name] MRN: [id number] DOB: 1955-11-20  David Strong is a 66 y.o. year old male who is a primary care patient of [name], MD and is actively engaged with the care management team. I reached out to Lucile Medero by phone today to assist with scheduling a follow up visit with the RN Case Manager  Follow up plan: Unsuccessful telephone outreach attempt made. The care management team will reach out to the patient again over the next 7 days. If patient returns call to provider office, please advise to call Embedded Care Management Care Guide Misty Stanley at [phone]  [facility] Guide, Embedded Care Coordination [facility]  Care Management  Direct Dial: [phone]

## 2022-01-05 DIAGNOSIS — Z7901 Long term (current) use of anticoagulants: Secondary | ICD-10-CM | POA: Diagnosis not present

## 2022-01-05 DIAGNOSIS — D696 Thrombocytopenia, unspecified: Secondary | ICD-10-CM | POA: Diagnosis not present

## 2022-01-05 DIAGNOSIS — Z794 Long term (current) use of insulin: Secondary | ICD-10-CM | POA: Diagnosis not present

## 2022-01-05 DIAGNOSIS — Z452 Encounter for adjustment and management of vascular access device: Secondary | ICD-10-CM | POA: Diagnosis not present

## 2022-01-05 DIAGNOSIS — I824Z2 Acute embolism and thrombosis of unspecified deep veins of left distal lower extremity: Secondary | ICD-10-CM | POA: Diagnosis not present

## 2022-01-05 DIAGNOSIS — M60111 Interstitial myositis, right shoulder: Secondary | ICD-10-CM | POA: Diagnosis not present

## 2022-01-05 DIAGNOSIS — I82432 Acute embolism and thrombosis of left popliteal vein: Secondary | ICD-10-CM | POA: Diagnosis not present

## 2022-01-05 DIAGNOSIS — E119 Type 2 diabetes mellitus without complications: Secondary | ICD-10-CM | POA: Diagnosis not present

## 2022-01-05 DIAGNOSIS — A409 Streptococcal sepsis, unspecified: Secondary | ICD-10-CM | POA: Diagnosis not present

## 2022-01-05 DIAGNOSIS — E876 Hypokalemia: Secondary | ICD-10-CM | POA: Diagnosis not present

## 2022-01-05 DIAGNOSIS — M009 Pyogenic arthritis, unspecified: Secondary | ICD-10-CM | POA: Diagnosis not present

## 2022-01-05 DIAGNOSIS — Z792 Long term (current) use of antibiotics: Secondary | ICD-10-CM | POA: Diagnosis not present

## 2022-01-05 DIAGNOSIS — Z79891 Long term (current) use of opiate analgesic: Secondary | ICD-10-CM | POA: Diagnosis not present

## 2022-01-05 DIAGNOSIS — M71062 Abscess of bursa, left knee: Secondary | ICD-10-CM | POA: Diagnosis not present

## 2022-01-05 DIAGNOSIS — I82442 Acute embolism and thrombosis of left tibial vein: Secondary | ICD-10-CM | POA: Diagnosis not present

## 2022-01-06 DIAGNOSIS — R6520 Severe sepsis without septic shock: Secondary | ICD-10-CM | POA: Diagnosis not present

## 2022-01-06 DIAGNOSIS — A419 Sepsis, unspecified organism: Secondary | ICD-10-CM | POA: Diagnosis not present

## 2022-01-06 DIAGNOSIS — R7881 Bacteremia: Secondary | ICD-10-CM | POA: Diagnosis not present

## 2022-01-08 ENCOUNTER — Telehealth: Payer: Self-pay

## 2022-01-08 DIAGNOSIS — R7881 Bacteremia: Secondary | ICD-10-CM | POA: Diagnosis not present

## 2022-01-08 DIAGNOSIS — R6520 Severe sepsis without septic shock: Secondary | ICD-10-CM | POA: Diagnosis not present

## 2022-01-08 DIAGNOSIS — A419 Sepsis, unspecified organism: Secondary | ICD-10-CM | POA: Diagnosis not present

## 2022-01-08 DIAGNOSIS — Z792 Long term (current) use of antibiotics: Secondary | ICD-10-CM | POA: Diagnosis not present

## 2022-01-08 NOTE — Telephone Encounter (Signed)
Pt requesting a refill on  dapagliflozin propanediol (FARXIGA) 10 MG TABS tablet  Pharmacy: [id number] - , [place] - [address]  LOV 12/27/21 ROV 03/29/22

## 2022-01-09 DIAGNOSIS — M60111 Interstitial myositis, right shoulder: Secondary | ICD-10-CM | POA: Diagnosis not present

## 2022-01-09 DIAGNOSIS — M009 Pyogenic arthritis, unspecified: Secondary | ICD-10-CM | POA: Diagnosis not present

## 2022-01-09 DIAGNOSIS — I82442 Acute embolism and thrombosis of left tibial vein: Secondary | ICD-10-CM | POA: Diagnosis not present

## 2022-01-09 DIAGNOSIS — Z7901 Long term (current) use of anticoagulants: Secondary | ICD-10-CM | POA: Diagnosis not present

## 2022-01-09 DIAGNOSIS — Z792 Long term (current) use of antibiotics: Secondary | ICD-10-CM | POA: Diagnosis not present

## 2022-01-09 DIAGNOSIS — Z452 Encounter for adjustment and management of vascular access device: Secondary | ICD-10-CM | POA: Diagnosis not present

## 2022-01-09 DIAGNOSIS — I824Z2 Acute embolism and thrombosis of unspecified deep veins of left distal lower extremity: Secondary | ICD-10-CM | POA: Diagnosis not present

## 2022-01-09 DIAGNOSIS — Z79891 Long term (current) use of opiate analgesic: Secondary | ICD-10-CM | POA: Diagnosis not present

## 2022-01-09 DIAGNOSIS — I82432 Acute embolism and thrombosis of left popliteal vein: Secondary | ICD-10-CM | POA: Diagnosis not present

## 2022-01-09 DIAGNOSIS — D696 Thrombocytopenia, unspecified: Secondary | ICD-10-CM | POA: Diagnosis not present

## 2022-01-09 DIAGNOSIS — E876 Hypokalemia: Secondary | ICD-10-CM | POA: Diagnosis not present

## 2022-01-09 DIAGNOSIS — A409 Streptococcal sepsis, unspecified: Secondary | ICD-10-CM | POA: Diagnosis not present

## 2022-01-09 DIAGNOSIS — E119 Type 2 diabetes mellitus without complications: Secondary | ICD-10-CM | POA: Diagnosis not present

## 2022-01-09 DIAGNOSIS — Z794 Long term (current) use of insulin: Secondary | ICD-10-CM | POA: Diagnosis not present

## 2022-01-09 DIAGNOSIS — M71062 Abscess of bursa, left knee: Secondary | ICD-10-CM | POA: Diagnosis not present

## 2022-01-09 MED ORDER — DAPAGLIFLOZIN PROPANEDIOL 10 MG PO TABS
10.0000 mg | ORAL_TABLET | Freq: Every day | ORAL | 1 refills | Status: DC
Start: 2022-01-09 — End: 2022-08-17

## 2022-01-09 NOTE — Telephone Encounter (Signed)
Prescription sent to pharmacy on file. 

## 2022-01-10 ENCOUNTER — Ambulatory Visit: Payer: Medicare Other | Admitting: Emergency Medicine

## 2022-01-11 DIAGNOSIS — A409 Streptococcal sepsis, unspecified: Secondary | ICD-10-CM | POA: Diagnosis not present

## 2022-01-11 DIAGNOSIS — Z792 Long term (current) use of antibiotics: Secondary | ICD-10-CM | POA: Diagnosis not present

## 2022-01-11 DIAGNOSIS — Z452 Encounter for adjustment and management of vascular access device: Secondary | ICD-10-CM | POA: Diagnosis not present

## 2022-01-11 DIAGNOSIS — I824Z2 Acute embolism and thrombosis of unspecified deep veins of left distal lower extremity: Secondary | ICD-10-CM | POA: Diagnosis not present

## 2022-01-11 DIAGNOSIS — I82432 Acute embolism and thrombosis of left popliteal vein: Secondary | ICD-10-CM | POA: Diagnosis not present

## 2022-01-11 DIAGNOSIS — M009 Pyogenic arthritis, unspecified: Secondary | ICD-10-CM | POA: Diagnosis not present

## 2022-01-11 DIAGNOSIS — M71062 Abscess of bursa, left knee: Secondary | ICD-10-CM | POA: Diagnosis not present

## 2022-01-11 DIAGNOSIS — Z794 Long term (current) use of insulin: Secondary | ICD-10-CM | POA: Diagnosis not present

## 2022-01-11 DIAGNOSIS — E119 Type 2 diabetes mellitus without complications: Secondary | ICD-10-CM | POA: Diagnosis not present

## 2022-01-11 DIAGNOSIS — Z79891 Long term (current) use of opiate analgesic: Secondary | ICD-10-CM | POA: Diagnosis not present

## 2022-01-11 DIAGNOSIS — D696 Thrombocytopenia, unspecified: Secondary | ICD-10-CM | POA: Diagnosis not present

## 2022-01-11 DIAGNOSIS — M60111 Interstitial myositis, right shoulder: Secondary | ICD-10-CM | POA: Diagnosis not present

## 2022-01-11 DIAGNOSIS — Z7901 Long term (current) use of anticoagulants: Secondary | ICD-10-CM | POA: Diagnosis not present

## 2022-01-11 DIAGNOSIS — E876 Hypokalemia: Secondary | ICD-10-CM | POA: Diagnosis not present

## 2022-01-11 DIAGNOSIS — I82442 Acute embolism and thrombosis of left tibial vein: Secondary | ICD-10-CM | POA: Diagnosis not present

## 2022-01-11 NOTE — Chronic Care Management (AMB) (Signed)
  Care Coordination Note  01/11/2022 Name: David Strong MRN: [id number] DOB: Aug 27, 1955  David Strong is a 66 y.o. year old male who is a primary care patient of [name], MD and is actively engaged with the care management team. I reached out to Adisa Kithcart by phone today to assist with scheduling a follow up visit with the RN Case Manager  Follow up plan: Unsuccessful telephone outreach attempt made. A HIPAA compliant phone message was left for the patient providing contact information and requesting a return call.  The care management team will reach out to the patient again over the next 7 days.  If patient returns call to provider office, please advise to call Embedded Care Management Care Guide Misty Stanley at [phone].  Gwenevere Ghazi  Care Guide, Embedded Care Coordination [facility] Management  Direct Dial: [phone]

## 2022-01-12 LAB — FUNGAL ORGANISM REFLEX

## 2022-01-12 LAB — FUNGUS CULTURE RESULT

## 2022-01-13 DIAGNOSIS — A419 Sepsis, unspecified organism: Secondary | ICD-10-CM | POA: Diagnosis not present

## 2022-01-13 DIAGNOSIS — R6520 Severe sepsis without septic shock: Secondary | ICD-10-CM | POA: Diagnosis not present

## 2022-01-13 DIAGNOSIS — R7881 Bacteremia: Secondary | ICD-10-CM | POA: Diagnosis not present

## 2022-01-16 DIAGNOSIS — Z792 Long term (current) use of antibiotics: Secondary | ICD-10-CM | POA: Diagnosis not present

## 2022-01-17 ENCOUNTER — Telehealth: Payer: Self-pay | Admitting: Emergency Medicine

## 2022-01-17 NOTE — Telephone Encounter (Signed)
Patient would like you to call him concerning his health - but states that he is not having any problems

## 2022-01-17 NOTE — Chronic Care Management (AMB) (Signed)
  Care Coordination Note  01/17/2022 Name: David Strong MRN: [id number] DOB: July 31, 1956  David Strong is a 66 y.o. year old male who is a primary care patient of Horald Pollen, MD and is actively engaged with the care management team. I reached out to [place] by phone today to assist with scheduling a follow up visit with the RN Case Manager  Follow up plan: A third unsuccessful telephone outreach attempt made. A HIPAA compliant phone message was left for the patient providing contact information and requesting a return call. Unable to make contact on outreach attempts x 3. PCP Horald Pollen, MD notified via routed documentation in medical record. We have been unable to make contact with the patient for follow up. The care management team is available to follow up with the patient after provider conversation with the patient regarding recommendation for care management engagement and subsequent re-referral to the care management team.   [place] Management  Direct Dial: [phone]

## 2022-01-18 ENCOUNTER — Ambulatory Visit: Payer: Medicare Other | Admitting: *Deleted

## 2022-01-18 DIAGNOSIS — E1165 Type 2 diabetes mellitus with hyperglycemia: Secondary | ICD-10-CM

## 2022-01-18 NOTE — Telephone Encounter (Signed)
Called patient back in reference to previous message. Patient was calling about medication issues. All issues are resolved

## 2022-01-18 NOTE — Chronic Care Management (AMB) (Signed)
Care Management    RN Visit Note  01/18/2022 Name: David Strong MRN: [id number] DOB: March 24, 1956  Subjective: David Strong is a 66 y.o. year old male who is a primary care patient of Sagardia, David Bloomer, MD. The care management team was consulted for assistance with disease management and care coordination needs.    Collaboration with Clinic CM scheduling care guide  for  RN CM case closure  in response to provider referral for case management and/or care coordination services.   Consent to Services:   David Strong was given information about Care Management services 12/20/21 including:  Care Management services includes personalized support from designated clinical staff supervised by his physician, including individualized plan of care and coordination with other care providers 24/7 contact phone numbers for assistance for urgent and routine care needs. The patient may stop case management services at any time by phone call to the office staff.  Patient agreed to services and consent obtained.   Assessment: Review of patient past medical history, allergies, medications, health status, including review of consultants reports, laboratory and other test data, was performed as part of comprehensive evaluation and provision of chronic care management services.  Care Plan  Allergies  Allergen Reactions   Codeine Itching   Glipizide Er [Glipizide]     SOB, malaise. He tolerates XL without issues.   Metformin And Related Rash   Outpatient Encounter Medications as of 01/18/2022  Medication Sig   acetaminophen (TYLENOL) 325 MG tablet Take 2 tablets (650 mg total) by mouth every 6 (six) hours as needed for mild pain, fever or headache (or Fever >/= 101). (Patient not taking: Reported on 12/29/2021)   apixaban (ELIQUIS) 5 MG TABS tablet Take 1 tablet (5 mg total) by mouth 2 (two) times daily.   benzonatate (TESSALON) 200 MG capsule Take 1 capsule (200 mg total) by mouth 3 (three) times daily as  needed for cough.   Camphor-Eucalyptus-Menthol (VICKS VAPORUB EX) Apply 1 application. topically daily as needed (chest congestion).   cefadroxil (DURICEF) 500 MG capsule Take 1 capsule (500 mg total) by mouth 2 (two) times daily. Start 6/02 when done with iv ceftriaxone   cefTRIAXone (ROCEPHIN) IVPB Inject 2 g into the vein daily. Indication:   Strep pneumo bacteremia/septic arthritis First Dose: Yes Last Day of Therapy:  01/18/2022 Labs - Once weekly:  CBC/D and BMP, Labs - Every other week:  ESR and CRP Method of administration: IV Push Method of administration may be changed at the discretion of home infusion pharmacist based upon assessment of the patient and/or caregiver's ability to self-administer the medication ordered.   dapagliflozin propanediol (FARXIGA) 10 MG TABS tablet Take 1 tablet (10 mg total) by mouth daily.   furosemide (LASIX) 20 MG tablet Take 1 tablet (20 mg total) by mouth daily.   glipiZIDE (GLUCOTROL) 10 MG tablet Take 20 mg by mouth 2 (two) times daily.   glucose blood (ACCU-CHEK AVIVA PLUS) test strip Use to test blood glucose daily.   Insulin Pen Needle (PEN NEEDLES 3/16") 31G X 5 MM MISC 14 Units by Does not apply route 2 (two) times daily with a meal.   oxyCODONE (OXY IR/ROXICODONE) 5 MG immediate release tablet Take 1 tablet (5 mg total) by mouth 3 (three) times daily as needed for moderate pain or severe pain.   Phenol (EQ SORE THROAT SPRAY MT) Use as directed 1 spray in the mouth or throat daily as needed (sore throat).   polyethylene glycol (MIRALAX / GLYCOLAX) 17  g packet Take 17 g by mouth daily as needed.   potassium chloride (KLOR-CON M) 10 MEQ tablet Take 1 tablet (10 mEq total) by mouth daily.   sildenafil (VIAGRA) 100 MG tablet Take 0.5-1 tablets (50-100 mg total) by mouth daily as needed for erectile dysfunction.   No facility-administered encounter medications on file as of 01/18/2022.   Patient Active Problem List   Diagnosis Date Noted   Septic  prepatellar bursitis of left knee    Pyomyositis    Cellulitis    Obese 12/05/2021   Bilateral leg edema 11/28/2021   DVT (deep venous thrombosis) (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)    Severe sepsis ([place]) 11/23/2021   Pneumococcal bacteremia 11/23/2021   Hypokalemia 11/23/2021   Hypophosphatemia 11/23/2021   CAP (community acquired pneumonia) 11/22/2021   High anion gap metabolic acidosis 02/13/9484   Septic arthritis ([place]) 11/22/2021   Transaminitis 11/22/2021   Right inguinal hernia 10/12/2021   Type 2 diabetes mellitus ([place]) 06/07/2015   Glucose intolerance (impaired glucose tolerance) 08/03/2012   Leucopenia 08/03/2012   Thrombocytopenia ([place]) 08/03/2012   Conditions to be addressed/monitored: DMII  Care Plan : RN Care Manager Plan of Care  Updates made by David Royalty, RN since 01/18/2022 12:00 AM     Problem: Chronic Disease Management Needs   Priority: High     Long-Range Goal: Development of plan of care for long term chronic disease management   Start Date: 12/28/2021  Expected End Date: 12/29/2022  Priority: High  Note:   Current Barriers:  Chronic Disease Management support and education needs related to DMII Recent extended hospitalization April 5- Dec 18, 2021 for sepsis/ CAP; septic arthritis/ abscess; DVT Discharged home to self-care with home health services for IV infusion/ wound vac  RNCM Clinical Goal(s):  Patient will demonstrate ongoing health management independence as evidenced by DMII; post-hospital discharge recuperation        through collaboration with RN Care manager, provider, and care team.   Interventions: 1:1 collaboration with primary care provider regarding development and update of comprehensive plan of care as evidenced by provider attestation and co-signature Inter-disciplinary care team collaboration (see longitudinal plan of care) Evaluation of current treatment plan  related to  self management and patient's adherence to plan as established by provider Review of patient status, including review of consultants reports, relevant laboratory and other test results, and medications completed 01/18/22: Clinic RN CM case closure note: was notified by CM scheduling care guide 01/17/22 that she was unable to maintain contact with patient x 3 outreach attempts to schedule follow up telephone visit with Clinic RN CM; case closure accordingly with completion of care plan; verified that scheduling care guide  notified patient's PCP of CM case closure/  inability to maintain contact with patient    Plan:  We have been unable to make contact with the patient for follow up. The care management team is available to follow up with the patient after provider conversation with the patient regarding recommendation for care management engagement and subsequent re-referral to the care management team No further follow up required: Clinic RN CM case closure today due to inability to maintain contact with patient  Oneta Rack, RN, BSN, [place] [phone]: direct office

## 2022-01-19 ENCOUNTER — Telehealth: Payer: Self-pay | Admitting: *Deleted

## 2022-01-19 ENCOUNTER — Telehealth: Payer: Self-pay

## 2022-01-19 MED ORDER — CEFADROXIL 500 MG PO CAPS: 1000.0000 mg | ORAL_CAPSULE | Freq: Two times a day (BID) | ORAL | 0 refills | Status: DC

## 2022-01-19 NOTE — Addendum Note (Signed)
Addended by: Marcell Anger on: 01/19/2022 09:34 AM   Modules accepted: Orders

## 2022-01-19 NOTE — Telephone Encounter (Signed)
Sounds good - thanks Tiffany!

## 2022-01-19 NOTE — Telephone Encounter (Signed)
Thanks Tiff  1000 mg twice a day  They come in 500 or 1000 mg pills so lets see what pills he has

## 2022-01-19 NOTE — Telephone Encounter (Signed)
Patient called for clarification on medications. Patient advised to start taking cefadroxil today as he should've had his last dose of IV antibiotics yesterday. Patient questioned if he should continue taking Eliquis I advised he should contact his cardiologist regarding medication. I contacted [facility] to make them aware patient PICC line needs to be pulled today. Abby is going to send over Pull PICC orders again to make them aware.    Miamarie Moll Lesli Albee, CMA

## 2022-01-19 NOTE — Telephone Encounter (Signed)
Patient contacted the office with medication questions. Patient asked if he needs to continue to take his Eliquis despite the resolution of swelling in his legs. Advised patient to continue to take Eliquis as he is on it for a history of DVT. Advised patient that he should contact his PCP for management of medication. Patient verbalized understanding.

## 2022-01-19 NOTE — Telephone Encounter (Signed)
Spoke to patient - he currently has Cefadroxil 500 mg at home. Patient advised he needs to take 2 tablets BID, he may run out of medication before f/u appointment with Dr.Vu and to contact our office for refill. Patient voiced his understanding.  [place], CMA

## 2022-01-22 ENCOUNTER — Telehealth: Payer: Self-pay | Admitting: Emergency Medicine

## 2022-01-22 DIAGNOSIS — A409 Streptococcal sepsis, unspecified: Secondary | ICD-10-CM | POA: Diagnosis not present

## 2022-01-22 NOTE — Telephone Encounter (Signed)
Called patient in reference to [facility] paperwork. Patient will provide updated FMLA paperwork

## 2022-01-22 NOTE — Telephone Encounter (Signed)
Patient would like to talk to you about extended [facility]

## 2022-01-23 ENCOUNTER — Ambulatory Visit: Payer: Medicare Other | Admitting: Thoracic Surgery (Cardiothoracic Vascular Surgery)

## 2022-01-23 VITALS — BP 117/72 | HR 96 | Resp 20 | Ht 64.0 in | Wt 150.0 lb

## 2022-01-23 DIAGNOSIS — M009 Pyogenic arthritis, unspecified: Secondary | ICD-10-CM | POA: Diagnosis not present

## 2022-01-23 DIAGNOSIS — Z09 Encounter for follow-up examination after completed treatment for conditions other than malignant neoplasm: Secondary | ICD-10-CM | POA: Diagnosis not present

## 2022-01-23 NOTE — Progress Notes (Signed)
[address]       [name] [address]             [phone]       HPI: Mr. Noboa returns for follow-up regarding his sternoclavicular joint abscess.  Deborah Dondero is a 66 year old man who was recently admitted with pneumonia and polyarthritis.  He developed septic arthritis of the left sternoclavicular joint with an abscess extending into the neck and under the pectoralis.  He required I&D and VAC placement.  He grew Streptococcus pneumonia from the blood.  TEE showed no evidence of endocarditis.  He feels some movement in the left sternoclavicular joint but has good range of motion in his left arm.  He complains of inability to abduct his right arm.  He has been working with physical therapy but they have not been working on range of motion with that arm.  He was seen by Dr. Magnus Ivan of orthopedics during his admission.  He has not had any follow-up with him.  Swelling in legs has resolved.  He has a lot of questions about his blood thinner and other medical issues.  Past Medical History:  Diagnosis Date   Diabetes mellitus without complication (HCC)     Current Outpatient Medications  Medication Sig Dispense Refill   apixaban (ELIQUIS) 5 MG TABS tablet Take 1 tablet (5 mg total) by mouth 2 (two) times daily. 60 tablet 2   benzonatate (TESSALON) 200 MG capsule Take 1 capsule (200 mg total) by mouth 3 (three) times daily as needed for cough. 20 capsule 0   Camphor-Eucalyptus-Menthol (VICKS VAPORUB EX) Apply 1 application. topically daily as needed (chest congestion).     cefadroxil (DURICEF) 500 MG capsule Take 2 capsules (1,000 mg total) by mouth 2 (two) times daily. Start 6/02 when done with iv ceftriaxone 120 capsule 0   dapagliflozin propanediol (FARXIGA) 10 MG TABS tablet Take 1 tablet (10 mg total) by mouth daily. 90 tablet 1   furosemide (LASIX) 20 MG tablet Take 1 tablet (20 mg total) by mouth daily. 14 tablet 1   glipiZIDE (GLUCOTROL) 10 MG tablet  Take 20 mg by mouth 2 (two) times daily.     glucose blood (ACCU-CHEK AVIVA PLUS) test strip Use to test blood glucose daily. 100 each 3   Insulin Pen Needle (PEN NEEDLES 3/16") 31G X 5 MM MISC 14 Units by Does not apply route 2 (two) times daily with a meal. 50 each 3   oxyCODONE (OXY IR/ROXICODONE) 5 MG immediate release tablet Take 1 tablet (5 mg total) by mouth 3 (three) times daily as needed for moderate pain or severe pain. 20 tablet 0   Phenol (EQ SORE THROAT SPRAY MT) Use as directed 1 spray in the mouth or throat daily as needed (sore throat).     polyethylene glycol (MIRALAX / GLYCOLAX) 17 g packet Take 17 g by mouth daily as needed. 14 each 0   sildenafil (VIAGRA) 100 MG tablet Take 0.5-1 tablets (50-100 mg total) by mouth daily as needed for erectile dysfunction. 5 tablet 11   acetaminophen (TYLENOL) 325 MG tablet Take 2 tablets (650 mg total) by mouth every 6 (six) hours as needed for mild pain, fever or headache (or Fever >/= 101). (Patient not taking: Reported on 12/29/2021)     potassium chloride (KLOR-CON M) 10 MEQ tablet Take 1 tablet (10 mEq total) by mouth daily. (Patient not taking: Reported on 01/23/2022) 14 tablet 1   No current facility-administered  medications for this visit.    Physical Exam BP 117/72   Pulse 96   Resp 20   Ht 5\' 4"  (1.626 m)   Wt 150 lb (68 kg)   SpO2 99% Comment: RA  BMI 25.47 kg/m  66 year old man in no acute distress Left clavicular incision with hypertrophic granulation tissue, no fluctuance, surrounding skin normal, gross movement of joint visible Abduction right arm 0/5   Impression: Tej Murdaugh is a 66 year old gentleman with recent pneumonia with sepsis and polyarthritis.  He developed a left sternoclavicular abscess which required incision and drainage and VAC placement.  His wound is actually healed more rapidly than I thought it would.  He does have hypertrophic granulation tissue.  I cauterized that with silver nitrate.  Plan to see  him back in a week to check it again.  He had questions about his blood thinner.  I explained that with a DVT, he typically would be treated for 6 months, but that decision will be up to his primary physician not me.  Limited ROM right shoulder-needs to follow-up with Dr. 76 who saw him in the hospital.  Plan:  Follow-up with Dr. Magnus Ivan of orthopedics Return in 1 week to check on sternoclavicular wound.  Magnus Ivan, MD [facility] [phone]

## 2022-01-25 LAB — ACID FAST CULTURE WITH REFLEXED SENSITIVITIES (MYCOBACTERIA): Acid Fast Culture: NEGATIVE

## 2022-01-26 NOTE — Telephone Encounter (Signed)
Patient would like for cma to call back concerning FMLA paperwork

## 2022-01-26 NOTE — Telephone Encounter (Signed)
Requesting that paperwork be filled out until next August dropped paperwork off.   Fax #- [phone]

## 2022-01-26 NOTE — Telephone Encounter (Signed)
Called patient and left message for patient to call office in regards to [facility] paperwork

## 2022-01-26 NOTE — Telephone Encounter (Signed)
Patient called back and will drop off FMLA paperwork today

## 2022-01-29 NOTE — Telephone Encounter (Signed)
Called patient and left message in reference to [facility] paperwork

## 2022-01-29 NOTE — Telephone Encounter (Signed)
Paperwork received. Will notify patient when paperwork is signed and ready for pick up

## 2022-01-30 ENCOUNTER — Telehealth: Payer: Self-pay

## 2022-01-30 ENCOUNTER — Encounter: Payer: Self-pay | Admitting: Thoracic Surgery (Cardiothoracic Vascular Surgery)

## 2022-01-30 ENCOUNTER — Ambulatory Visit: Payer: Medicare Other | Admitting: Thoracic Surgery (Cardiothoracic Vascular Surgery)

## 2022-01-30 VITALS — BP 124/78 | HR 96 | Resp 20 | Ht 64.0 in | Wt 149.0 lb

## 2022-01-30 DIAGNOSIS — Z09 Encounter for follow-up examination after completed treatment for conditions other than malignant neoplasm: Secondary | ICD-10-CM | POA: Diagnosis not present

## 2022-01-30 DIAGNOSIS — M009 Pyogenic arthritis, unspecified: Secondary | ICD-10-CM

## 2022-01-30 NOTE — Progress Notes (Signed)
[address]       David Strong [address]             [phone]    HPI: David Strong returns for follow-up of his sternoclavicular wound.  David Strong is a 66 year old man who was recently admitted with pneumonia and polyarthritis.  David Strong developed septic arthritis of the left sternoclavicular joint with an abscess extending into the neck and under the pectoralis.  David Strong required I&D and VAC placement.  David Strong grew Streptococcus pneumonia from the blood.  TEE showed no evidence of endocarditis.  I saw him in the office last week.  David Strong had some hypertrophic granulation tissue which we cauterized with silver nitrate.  David Strong was having difficulty with his right shoulder.  David Strong has not yet been in contact with Dr. Magnus Ivan.   Past Medical History:  Diagnosis Date   Diabetes mellitus without complication (HCC)      Current Outpatient Medications  Medication Sig Dispense Refill   apixaban (ELIQUIS) 5 MG TABS tablet Take 1 tablet (5 mg total) by mouth 2 (two) times daily. 60 tablet 2   benzonatate (TESSALON) 200 MG capsule Take 1 capsule (200 mg total) by mouth 3 (three) times daily as needed for cough. 20 capsule 0   Camphor-Eucalyptus-Menthol (VICKS VAPORUB EX) Apply 1 application. topically daily as needed (chest congestion).     cefadroxil (DURICEF) 500 MG capsule Take 2 capsules (1,000 mg total) by mouth 2 (two) times daily. Start 6/02 when done with iv ceftriaxone 120 capsule 0   dapagliflozin propanediol (FARXIGA) 10 MG TABS tablet Take 1 tablet (10 mg total) by mouth daily. 90 tablet 1   furosemide (LASIX) 20 MG tablet Take 1 tablet (20 mg total) by mouth daily. 14 tablet 1   glipiZIDE (GLUCOTROL) 10 MG tablet Take 20 mg by mouth 2 (two) times daily.     glucose blood (ACCU-CHEK AVIVA PLUS) test strip Use to test blood glucose daily. 100 each 3   Insulin Pen Needle (PEN NEEDLES 3/16") 31G X 5 MM MISC 14 Units by Does not apply route 2 (two) times daily with a meal. 50 each 3    oxyCODONE (OXY IR/ROXICODONE) 5 MG immediate release tablet Take 1 tablet (5 mg total) by mouth 3 (three) times daily as needed for moderate pain or severe pain. 20 tablet 0   Phenol (EQ SORE THROAT SPRAY MT) Use as directed 1 spray in the mouth or throat daily as needed (sore throat).     polyethylene glycol (MIRALAX / GLYCOLAX) 17 g packet Take 17 g by mouth daily as needed. 14 each 0   potassium chloride (KLOR-CON M) 10 MEQ tablet Take 1 tablet (10 mEq total) by mouth daily. 14 tablet 1   sildenafil (VIAGRA) 100 MG tablet Take 0.5-1 tablets (50-100 mg total) by mouth daily as needed for erectile dysfunction. 5 tablet 11   acetaminophen (TYLENOL) 325 MG tablet Take 2 tablets (650 mg total) by mouth every 6 (six) hours as needed for mild pain, fever or headache (or Fever >/= 101). (Patient not taking: Reported on 12/29/2021)     No current facility-administered medications for this visit.    Physical Exam BP 124/78   Pulse 96   Resp 20   Ht 5\' 4"  (1.626 m)   Wt 149 lb (67.6 kg)   SpO2 98% Comment: RA  BMI 25.71 kg/m  66 year old man in no acute distress Left clavicular wound with hypertrophic granulation tissue decreased in  size from last week.  Impression: David Strong is a 66 year old gentleman with recent pneumonia with sepsis and polyarthritis.  David Strong developed a left sternoclavicular abscess which required incision and drainage and VAC placement.  Left sternoclavicular abscess-continues to heal very nicely.  Incision has contracted some but there is still some hypertrophic granulation tissue.  I cauterized that with silver nitrate again.  David Strong tolerated that well.  I will plan to have him come back in 2 weeks to recheck the wound.  Follow-up with Dr. Magnus Ivan regarding right shoulder which continues to be an issue.  Plan: Return in 2 weeks for wound check.  [name], MD [facility] [phone]

## 2022-01-30 NOTE — Telephone Encounter (Signed)
Patient called office today stating he has noticed a rash on his hand before the weekend. States that the rash is only on his hand and no where else. Denies any pain,swelling,fever.  Rash has improved over the weekend. Would like to know if there is anything he should do regarding rash.  Is still taking Cefadroxil. Noticed rash after starting this.  Leatrice Jewels, RMA

## 2022-01-30 NOTE — Telephone Encounter (Signed)
Called and left message to inform patient that his FMLA paperwork is ready for pick up. Paperwork will be at the front desk

## 2022-02-01 NOTE — Telephone Encounter (Signed)
Called patient back regarding concern for rash. Advised he continue to take Cefardroxil and monitor his rash. Advised he call office back if rash is spreading or notices any new symptoms.  Juanita Laster, RMA

## 2022-02-06 ENCOUNTER — Telehealth: Payer: Self-pay

## 2022-02-06 MED ORDER — CEFADROXIL 500 MG PO CAPS: 1000.0000 mg | ORAL_CAPSULE | Freq: Two times a day (BID) | ORAL | 0 refills | Status: AC

## 2022-02-06 NOTE — Telephone Encounter (Signed)
Patient called, states he ran out of his cefadroxil. Confirmed that he has been taking 1,000mg  twice a day, he was just using his old prescription that was only dispensed with 60 tablets.   Prescription sent on 01/19/22 with 120 tablets was ordered as print. Will resend prescription so patient can pick up remainder of antibiotic therapy.   Sandie Ano, RN

## 2022-02-13 ENCOUNTER — Telehealth: Payer: Self-pay | Admitting: Emergency Medicine

## 2022-02-13 ENCOUNTER — Ambulatory Visit: Payer: Medicare Other | Admitting: Orthopaedic Surgery

## 2022-02-13 ENCOUNTER — Ambulatory Visit (INDEPENDENT_AMBULATORY_CARE_PROVIDER_SITE_OTHER): Payer: Medicare Other

## 2022-02-13 ENCOUNTER — Other Ambulatory Visit: Payer: Self-pay | Admitting: Emergency Medicine

## 2022-02-13 ENCOUNTER — Encounter: Payer: Self-pay | Admitting: Orthopaedic Surgery

## 2022-02-13 DIAGNOSIS — M25562 Pain in left knee: Secondary | ICD-10-CM | POA: Diagnosis not present

## 2022-02-13 DIAGNOSIS — M25511 Pain in right shoulder: Secondary | ICD-10-CM | POA: Diagnosis not present

## 2022-02-13 MED ORDER — TRAMADOL HCL 50 MG PO TABS: 50.0000 mg | ORAL_TABLET | Freq: Three times a day (TID) | ORAL | 0 refills | Status: AC | PRN

## 2022-02-14 NOTE — Telephone Encounter (Signed)
Called patient to make him aware that his medication was sent to his pharmacy

## 2022-02-19 ENCOUNTER — Ambulatory Visit: Payer: Medicare Other | Admitting: Thoracic Surgery (Cardiothoracic Vascular Surgery)

## 2022-02-21 ENCOUNTER — Encounter: Payer: Self-pay | Admitting: Physician Assistant

## 2022-02-21 ENCOUNTER — Ambulatory Visit: Payer: Medicare Other | Admitting: Physician Assistant

## 2022-02-21 VITALS — BP 117/78 | HR 90 | Resp 20 | Ht 64.0 in | Wt 154.0 lb

## 2022-02-21 DIAGNOSIS — Z09 Encounter for follow-up examination after completed treatment for conditions other than malignant neoplasm: Secondary | ICD-10-CM

## 2022-02-21 DIAGNOSIS — M009 Pyogenic arthritis, unspecified: Secondary | ICD-10-CM

## 2022-02-21 MED ORDER — APIXABAN 5 MG PO TABS
5.0000 mg | ORAL_TABLET | Freq: Two times a day (BID) | ORAL | 2 refills | Status: DC
Start: 2022-02-21 — End: 2022-03-08

## 2022-02-21 NOTE — Progress Notes (Signed)
[address]       David Strong [id number]             [phone]       HPI: David Strong returns today for scheduled follow-up and wound check following excisional debridement of sternoclavicular abscess in April of this year by Dr. Dorris Fetch.  He continues to note movement in the left clavicle when he raises his left arm he is not having much pain in the chest bleeding at this point.  He denies having any fever.  He continues to do dressing changes twice daily. He has completed a 88-month course of Eliquis.   Current Outpatient Medications  Medication Sig Dispense Refill   apixaban (ELIQUIS) 5 MG TABS tablet Take 1 tablet (5 mg total) by mouth 2 (two) times daily. 60 tablet 2   benzonatate (TESSALON) 200 MG capsule Take 1 capsule (200 mg total) by mouth 3 (three) times daily as needed for cough. 20 capsule 0   Camphor-Eucalyptus-Menthol (VICKS VAPORUB EX) Apply 1 application. topically daily as needed (chest congestion).     cefadroxil (DURICEF) 500 MG capsule Take 2 capsules (1,000 mg total) by mouth 2 (two) times daily. Start 6/02 when done with iv ceftriaxone 120 capsule 0   dapagliflozin propanediol (FARXIGA) 10 MG TABS tablet Take 1 tablet (10 mg total) by mouth daily. 90 tablet 1   furosemide (LASIX) 20 MG tablet Take 1 tablet (20 mg total) by mouth daily. 14 tablet 1   glipiZIDE (GLUCOTROL) 10 MG tablet Take 20 mg by mouth 2 (two) times daily.     glucose blood (ACCU-CHEK AVIVA PLUS) test strip Use to test blood glucose daily. 100 each 3   Insulin Pen Needle (PEN NEEDLES 3/16") 31G X 5 MM MISC 14 Units by Does not apply route 2 (two) times daily with a meal. 50 each 3   Phenol (EQ SORE THROAT SPRAY MT) Use as directed 1 spray in the mouth or throat daily as needed (sore throat).     polyethylene glycol (MIRALAX / GLYCOLAX) 17 g packet Take 17 g by mouth daily as needed. 14 each 0   potassium chloride (KLOR-CON M) 10 MEQ tablet Take 1 tablet (10 mEq total) by  mouth daily. 14 tablet 1   sildenafil (VIAGRA) 100 MG tablet Take 0.5-1 tablets (50-100 mg total) by mouth daily as needed for erectile dysfunction. 5 tablet 11   acetaminophen (TYLENOL) 325 MG tablet Take 2 tablets (650 mg total) by mouth every 6 (six) hours as needed for mild pain, fever or headache (or Fever >/= 101). (Patient not taking: Reported on 12/29/2021)     oxyCODONE (OXY IR/ROXICODONE) 5 MG immediate release tablet Take 1 tablet (5 mg total) by mouth 3 (three) times daily as needed for moderate pain or severe pain. (Patient not taking: Reported on 02/21/2022) 20 tablet 0   No current facility-administered medications for this visit.    Physical Exam Vital signs BP 117/78 Pulse 90 Respirations 20 SPO2 96% on room air  General: David Strong appears well and is in no acute distress. Wound: Dressing was removed from the sternal wound.  The wound has completely filled in to the surface of the surrounding skin.  He has 2 patches of granulation tissue measuring about 1 cm wide by 3 cm in length.  There was some serous drainage on the dressing but none at the wound site.  Surrounding tissues appear healthy.  Diagnostic Tests: None today  Impression / Plan: Continued healing of the left [place] joint abscess wound now about 3 months post excisional debridement and initial management with a wound VAC.  He is being followed by the infectious disease service for management of his antibiotics.  He has been seen by Dr. Magnus Ivan for pain in his right shoulder that is felt to be due to arthritis.  He has completed his course Eliquis for the left popliteal vein DVT.  He may take NSAIDS for his arthritis from our standpoint.  We will plan to continue to follow the chest wound until the site has completely epithelialized. Return in 2 weeks and as needed.   Leary Roca, PA-C [facility] [phone]

## 2022-02-21 NOTE — Telephone Encounter (Signed)
Caller & Relationship to patient:Self    Call back [phone]   Date of last office visit:01/23/2022   Date of next office visit:   Medication(s) to be refilled:apixaban (ELIQUIS) 5 MG TABS tablet  dapagliflozin propanediol (FARXIGA) 10 MG TABS tablet       Preferred Pharmacy:  [id number] - [place], [place] - [address] Phone:  [phone]  Fax:  [phone]

## 2022-02-21 NOTE — Telephone Encounter (Signed)
Eliquis rx sent to pharmacy on file

## 2022-02-21 NOTE — Telephone Encounter (Signed)
It is okay to refill a prescription of Eliquis.  Thanks.

## 2022-02-21 NOTE — Patient Instructions (Signed)
Continue dessing changes twice daily.  Follow up in 2 weeks and PRN.

## 2022-02-22 ENCOUNTER — Other Ambulatory Visit: Payer: Self-pay

## 2022-02-22 ENCOUNTER — Encounter: Payer: Self-pay | Admitting: Internal Medicine

## 2022-02-22 ENCOUNTER — Ambulatory Visit: Payer: Medicare Other | Admitting: Internal Medicine

## 2022-02-22 VITALS — BP 120/80 | HR 90 | Resp 16 | Ht 64.0 in | Wt 155.4 lb

## 2022-02-22 DIAGNOSIS — M0010 Pneumococcal arthritis, unspecified joint: Secondary | ICD-10-CM

## 2022-02-22 NOTE — Progress Notes (Signed)
CC - f/u hospital discharge 5/12 for strep pna bacteremia and polyarthritis septic and pyomyositis/abscess  HPI: 67 yo male admitted early 11/2021 for sepsis with strep pna bsi and polyarthritis He had progressive left [place] joint necrosis/infection and soft tissue abscess and undergone I&D there by cts on 4/17 He has involvement of left knee and right shoulder as well Endocarditis r/o He was sent home on ceftriaxone for ease of administration  He has LUE picc  He has no n/v/diarrhea; no fever chill  He has LLE dvt during admission as well and is on anticoagulation eliquis  He has bilateral L>>R edema  He has no further pain in the right shoulder or the left knee.  He has wound vac on the left [place] joint and mild discomfort left upper shoulder  He is eating well gaining weight back   Reviewed opat labs; crp still high   He complains of upper/posterior left shoulder discomfort but better than hospital time   02/22/22 id clinic f/u Patient had left [place] wound check yesterday and will have follow up again in 2 weeks I reviewed his picture that he took yesterday; he didn't want me to open the dressing No f/c No diarrhea He is still taking cefadroxil 1 gram twice a day Knees not swollen and slightly hurt on extreme of flexion. ambulating He has xray right shoulder and left knee by dr Trevor Mace office  Objective:  Vitals:   02/22/22 1031  BP: 120/80  Pulse: 90  Resp: 16  SpO2: 98%     Physical Exam: General: no distress; well appearing  MSK: left [place] joint wound dressing clean/dry; reviewed picture from 7/5 --> granulating/keloid type reaction on wound; no effusion on left knee but slightly warm and intact passive/active rom. Right shoulder tender on passive abduction and compression superiorly at 60 degree    CBC: Lab Results  Component Value Date   WBC 4.5 12/27/2021   HGB 10.7 (L) 12/27/2021   HCT 31.6 (L) 12/27/2021   MCV 84.6 12/27/2021   PLT 300.0  12/27/2021      BMET Last metabolic panel Lab Results  Component Value Date   GLUCOSE 312 (H) 12/27/2021   NA 134 (L) 12/27/2021   K 4.2 12/27/2021   CL 96 12/27/2021   CO2 29 12/27/2021   BUN 8 12/27/2021   CREATININE 0.63 12/27/2021   GFRNONAA >60 12/15/2021   CALCIUM 8.9 12/27/2021   PHOS 3.1 12/15/2021   PROT 7.4 12/27/2021   ALBUMIN 3.2 (L) 12/27/2021   LABGLOB 2.3 04/07/2020   AGRATIO 2.0 04/07/2020   BILITOT 0.4 12/27/2021   ALKPHOS 156 (H) 12/27/2021   AST 14 12/27/2021   ALT 19 12/27/2021   ANIONGAP 6 12/15/2021   12/25/21 opat labs 5/8 WBC 4.2, SCr 0.7, ESR 89, CRP 72  Crp: 5/08    72 (<8) 4/25    15 (<1) 4/06    43   Micro Results: No results found for this or any previous visit (from the past 720 hour(s)).   Studies/Results: No results found.    Assessment/Plan:  Abx:  6/02-c cefadroxil  4/17-5/01 pcn (4/17 left  joint I&D); 5/01-6/01 ceftriaxone     David Strong is a 66 y.o. male with admission with pneumococcal bacteremia and concern with polyarticular septic arthritis also with new deep venous thromboses who ultimately underwent surgery with cardiothoracic surgery with I&D of left supraclavicular joint and excision of abscess on April 17.  OR findings and shown  that this [place] joint of been completely destroyed and there is necrotic debris that extended in the neck.  He also had concerns for right septic shoulder the orthopedic surgery unable to aspirate fluid felt the findings there were more consistent with myositis.  If he has also had a joint effusion the left knee again with concern for septic joint but with orthopedics ago unable unable to aspirate fluid and surgery not pursued.  He has had worsening of the knee swelling and now MRI of the knee shows continued moderate joint effusion with synovial enhancement and concerning for septic arthritis.  Additionally he has several fluid collection which are undoubtedly abscesses near the tibia  and femur.   02/22/22 assessment #strep pna infection S/p I&D left [place] joint 4/17. Incision almost healed Left knee/right shoulder periarticular soft tissue inflammation/abscess not amenable to I&D per ortho.   Crp had increased so we continued cefadroxil. He still has pain in the right shoulder/left knee but ambulatory and good use right UE.   Some pain at this time in knee/shoulder probably due to OA as well   Will recheck crp/labs Continue cefadroxil for now Follow up in 4-6 weeks  If crp normal will hold abx and recheck labs next visit if if that continues to be normal will discharge   Jabier Mutton, [place] [facility] [phone]  pager   [phone] cell 02/22/2022, 10:58 AM

## 2022-02-22 NOTE — Patient Instructions (Signed)
Continue your antibiotics for now (cefadroxil 1000 mg twice a day)  If you blood work looks good today I'll let you know if you can stop antibiotics now  See one of [place] again in 4-6 weeks

## 2022-02-23 ENCOUNTER — Telehealth: Payer: Self-pay

## 2022-02-23 LAB — CBC WITH DIFFERENTIAL/PLATELET
Absolute Monocytes: 190 cells/uL — ABNORMAL LOW (ref 200–950)
Basophils Absolute: 10 cells/uL (ref 0–200)
Basophils Relative: 0.4 %
Eosinophils Absolute: 30 cells/uL (ref 15–500)
Eosinophils Relative: 1.2 %
HCT: 44.8 % (ref 38.5–50.0)
Hemoglobin: 14.4 g/dL (ref 13.2–17.1)
Lymphs Abs: 1215 cells/uL (ref 850–3900)
MCH: 27.3 pg (ref 27.0–33.0)
MCHC: 32.1 g/dL (ref 32.0–36.0)
MCV: 85 fL (ref 80.0–100.0)
MPV: 11.1 fL (ref 7.5–12.5)
Monocytes Relative: 7.6 %
Neutro Abs: 1055 cells/uL — ABNORMAL LOW (ref 1500–7800)
Neutrophils Relative %: 42.2 %
Platelets: 149 10*3/uL (ref 140–400)
RBC: 5.27 10*6/uL (ref 4.20–5.80)
RDW: 14.4 % (ref 11.0–15.0)
Total Lymphocyte: 48.6 %
WBC: 2.5 10*3/uL — ABNORMAL LOW (ref 3.8–10.8)

## 2022-02-23 LAB — COMPLETE METABOLIC PANEL WITH GFR
AG Ratio: 1.8 (calc) (ref 1.0–2.5)
ALT: 12 U/L (ref 9–46)
AST: 13 U/L (ref 10–35)
Albumin: 4.4 g/dL (ref 3.6–5.1)
Alkaline phosphatase (APISO): 93 U/L (ref 35–144)
BUN: 12 mg/dL (ref 7–25)
CO2: 28 mmol/L (ref 20–32)
Calcium: 9.7 mg/dL (ref 8.6–10.3)
Chloride: 100 mmol/L (ref 98–110)
Creat: 0.8 mg/dL (ref 0.70–1.35)
Globulin: 2.5 g/dL (calc) (ref 1.9–3.7)
Glucose, Bld: 256 mg/dL — ABNORMAL HIGH (ref 65–99)
Potassium: 4.3 mmol/L (ref 3.5–5.3)
Sodium: 138 mmol/L (ref 135–146)
Total Bilirubin: 0.6 mg/dL (ref 0.2–1.2)
Total Protein: 6.9 g/dL (ref 6.1–8.1)
eGFR: 98 mL/min/{1.73_m2} (ref >=60)

## 2022-02-23 LAB — C-REACTIVE PROTEIN: CRP: 3.3 mg/L (ref <8.0)

## 2022-02-23 NOTE — Telephone Encounter (Signed)
-----   Message from Raymondo Band, MD sent at 02/23/2022  1:49 PM EDT ----- Hi team  His crp looks normal now Please tell him to stop his cefadroxil. He should keep his 4 week follow up appointment so we can recheck off abx and make sure he is ok  His wbc a little low could be due to cefadroxil, but to recheck in a few weeks as above  thanks

## 2022-02-23 NOTE — Telephone Encounter (Signed)
Left voicemail asking patient to return my call.   Fernand Sorbello P Uri Turnbough, CMA  

## 2022-02-23 NOTE — Telephone Encounter (Signed)
Patient aware of results and verbalized his understanding.

## 2022-03-01 ENCOUNTER — Inpatient Hospital Stay: Payer: Medicare Other | Attending: Hematology | Admitting: Hematology

## 2022-03-01 ENCOUNTER — Encounter: Payer: Self-pay | Admitting: Hematology

## 2022-03-01 ENCOUNTER — Other Ambulatory Visit: Payer: Self-pay

## 2022-03-01 ENCOUNTER — Telehealth: Payer: Self-pay

## 2022-03-01 VITALS — BP 109/76 | HR 88 | Temp 98.0°F | Resp 18 | Ht 64.0 in | Wt 159.3 lb

## 2022-03-01 DIAGNOSIS — M009 Pyogenic arthritis, unspecified: Secondary | ICD-10-CM | POA: Insufficient documentation

## 2022-03-01 DIAGNOSIS — Z7901 Long term (current) use of anticoagulants: Secondary | ICD-10-CM | POA: Diagnosis not present

## 2022-03-01 DIAGNOSIS — A419 Sepsis, unspecified organism: Secondary | ICD-10-CM | POA: Diagnosis not present

## 2022-03-01 DIAGNOSIS — Z86718 Personal history of other venous thrombosis and embolism: Secondary | ICD-10-CM | POA: Diagnosis not present

## 2022-03-01 DIAGNOSIS — J189 Pneumonia, unspecified organism: Secondary | ICD-10-CM | POA: Insufficient documentation

## 2022-03-01 DIAGNOSIS — I82402 Acute embolism and thrombosis of unspecified deep veins of left lower extremity: Secondary | ICD-10-CM | POA: Diagnosis not present

## 2022-03-01 DIAGNOSIS — Z79899 Other long term (current) drug therapy: Secondary | ICD-10-CM | POA: Insufficient documentation

## 2022-03-01 NOTE — Progress Notes (Signed)
[place]   [phone] [phone]   Clinic Follow up Note   Patient Care Team: Horald Pollen, MD as PCP - General (Internal Medicine) 03/01/2022  CHIEF COMPLAINT:   ASSESSMENT & PLAN:  66 yo male with PMH of DM   Multiple thrombosis, right upper extremity superficial vein thrombosis, left lower extremity DVT, probably related to sepsis in 11/2021 -He was admitted to hospital for community-acquired pneumonia, subsequently found to have streptococcal bacteremia and septic multiple arthritis, quite surgical intervention.  During the hospital course, he was found to have left lower extremity DVT, right upper extremity superficial vein thrombosis, no PE.  His thrombosis is likely related to sepsis, especially the septic left knee and right shoulder.  No previous personal or family history of thrombosis. -His infection is cleared now, he is off antibiotics.  He has resumed most normal activities. -Plan to repeat Doppler of lower extremity, if is negative for DVT, will stop his Eliquis.  He has been treated for more than 3 months, which is adequate.  -will see him as needed   Multifocal pneumonia and sepsis, streptococcal bacteremia and septic arthritis  -Finished a course of antibiotics -Still has some residual arthralgia, but able to resume most daily activities.  Plan -Doppler of lower extremity in a week, if negative, will stop Eliquis -Follow-up as needed.  CURRENT THERAPY:  Eliquis 28m bid since 11/22/2021    INTERVAL HISTORY: Mr. DDaltois here for follow-up.  I initially met him in the hospital for DVT.  He presents to clinic by himself today. He saw Dr. VGale Journeyon 7/7 and finished antibiotics on7/03/2022 He is taking Eliquis, tolerating well, no bleeding  He saw Dr. BNinfa Linden he still has pain and stiffness of left knee and right shoulder, he is not able to take NSAIDs because of eliquis  He has a small open wound at left sternoclavicular  joint   REVIEW OF SYSTEMS:   Constitutional: Denies fevers, chills or abnormal weight loss Eyes: Denies blurriness of vision Ears, nose, mouth, throat, and face: Denies mucositis or sore throat Respiratory: Denies cough, dyspnea or wheezes Cardiovascular: Denies palpitation, chest discomfort or lower extremity swelling Gastrointestinal:  Denies nausea, heartburn or change in bowel habits Skin: Denies abnormal skin rashes Lymphatics: Denies new lymphadenopathy or easy bruising Neurological:Denies numbness, tingling or new weaknesses Behavioral/Psych: Mood is stable, no new changes  All other systems were reviewed with the patient and are negative.  MEDICAL HISTORY:  Past Medical History:  Diagnosis Date   Diabetes mellitus without complication ([place]     SURGICAL HISTORY: Past Surgical History:  Procedure Laterality Date   APPLICATION OF WOUND VAC Left 12/07/2021   Procedure: WOUND VAC CHANGE;  Surgeon: HMelrose Nakayama MD;  [facility]  Service: Vascular;  Laterality: Left;   INCISION AND DRAINAGE OF WOUND Left 12/04/2021   Procedure: IRRIGATION AND DEBRIDMENT OF STERNOCLAVICULAR WOUND, WOUND VAC PLACMENT.;  Surgeon: HMelrose Nakayama MD;  [facility]  Service: Vascular;  Laterality: Left;   IRRIGATION AND DEBRIDEMENT ABSCESS Left 12/07/2021   Procedure: IRRIGATION AND DEBRIDEMENT ABSCESS;  Surgeon: HMelrose Nakayama MD;  [facility]  Service: Vascular;  Laterality: Left;   TEE WITHOUT CARDIOVERSION N/A 12/08/2021   Procedure: TRANSESOPHAGEAL ECHOCARDIOGRAM (TEE);  Surgeon: CWerner Lean MD;  [facility];  Service: Cardiovascular;  Laterality: N/A;    I have reviewed the social history and family history with the patient and they are unchanged from previous note.  ALLERGIES:  is allergic to codeine, glipizide er [glipizide], and metformin and related.  MEDICATIONS:  Current Outpatient Medications  Medication Sig Dispense Refill    acetaminophen (TYLENOL) 325 MG tablet Take 2 tablets (650 mg total) by mouth every 6 (six) hours as needed for mild pain, fever or headache (or Fever >/= 101). (Patient not taking: Reported on 12/29/2021)     apixaban (ELIQUIS) 5 MG TABS tablet Take 1 tablet (5 mg total) by mouth 2 (two) times daily. 60 tablet 2   benzonatate (TESSALON) 200 MG capsule Take 1 capsule (200 mg total) by mouth 3 (three) times daily as needed for cough. 20 capsule 0   Camphor-Eucalyptus-Menthol (VICKS VAPORUB EX) Apply 1 application. topically daily as needed (chest congestion).     cefadroxil (DURICEF) 500 MG capsule Take 2 capsules (1,000 mg total) by mouth 2 (two) times daily. Start 6/02 when done with iv ceftriaxone (Patient not taking: Reported on 02/22/2022) 120 capsule 0   CLINDAMYCIN HCL PO Take 500 mg by mouth 2 (two) times daily.     dapagliflozin propanediol (FARXIGA) 10 MG TABS tablet Take 1 tablet (10 mg total) by mouth daily. 90 tablet 1   furosemide (LASIX) 20 MG tablet Take 1 tablet (20 mg total) by mouth daily. 14 tablet 1   glipiZIDE (GLUCOTROL) 10 MG tablet Take 20 mg by mouth 2 (two) times daily.     glucose blood (ACCU-CHEK AVIVA PLUS) test strip Use to test blood glucose daily. 100 each 3   Insulin Pen Needle (PEN NEEDLES 3/16") 31G X 5 MM MISC 14 Units by Does not apply route 2 (two) times daily with a meal. 50 each 3   oxyCODONE (OXY IR/ROXICODONE) 5 MG immediate release tablet Take 1 tablet (5 mg total) by mouth 3 (three) times daily as needed for moderate pain or severe pain. (Patient not taking: Reported on 02/21/2022) 20 tablet 0   Phenol (EQ SORE THROAT SPRAY MT) Use as directed 1 spray in the mouth or throat daily as needed (sore throat).     polyethylene glycol (MIRALAX / GLYCOLAX) 17 g packet Take 17 g by mouth daily as needed. 14 each 0   potassium chloride (KLOR-CON M) 10 MEQ tablet Take 1 tablet (10 mEq total) by mouth daily. 14 tablet 1   sildenafil (VIAGRA) 100 MG tablet Take 0.5-1  tablets (50-100 mg total) by mouth daily as needed for erectile dysfunction. 5 tablet 11   No current facility-administered medications for this visit.    PHYSICAL EXAMINATION: ECOG PERFORMANCE STATUS: 1 - Symptomatic but completely ambulatory  Vitals:   03/01/22 1340  BP: 109/76  Pulse: 88  Resp: 18  Temp: 98 F (36.7 C)  SpO2: 100%   Filed Weights   03/01/22 1340  Weight: 159 lb 4.8 oz (72.3 kg)    GENERAL:alert, no distress and comfortable SKIN: skin color, texture, turgor are normal, no rashes or significant lesions EYES: normal, Conjunctiva are pink and non-injected, sclera clear OROPHARYNX:no exudate, no erythema and lips, buccal mucosa, and tongue normal  NECK: supple, thyroid normal size, non-tender, without nodularity LYMPH:  no palpable lymphadenopathy in the cervical, axillary or inguinal LUNGS: clear to auscultation and percussion with normal breathing effort HEART: regular rate & rhythm and no murmurs and no lower extremity edema ABDOMEN:abdomen soft, non-tender and normal bowel sounds Musculoskeletal:no cyanosis of digits and no clubbing, left sternoclavicular joint is covered by gauze. NEURO: alert & oriented x 3 with fluent speech, no focal motor/sensory deficits  LABORATORY  DATA:  I have reviewed the data as listed    Latest Ref Rng & Units 02/22/2022   11:21 AM 12/27/2021    2:16 PM 12/15/2021    4:13 AM  CBC  WBC 3.8 - 10.8 Thousand/uL 2.5  4.5  5.0   Hemoglobin 13.2 - 17.1 g/dL 14.4  10.7  8.8   Hematocrit 38.5 - 50.0 % 44.8  31.6  27.1   Platelets 140 - 400 Thousand/uL 149  300.0  315         Latest Ref Rng & Units 02/22/2022   11:21 AM 12/27/2021    2:16 PM 12/15/2021    4:13 AM  CMP  Glucose 65 - 99 mg/dL 256  312  289   BUN 7 - 25 mg/dL _0 Creatinine 0.70 - 1.35 mg/dL 0.80  0.63  0.58   Sodium 135 - 146 mmol/L 138  134  134   Potassium 3.5 - 5.3 mmol/L 4.3  4.2  4.0   Chloride 98 - 110 mmol/L 100  96  99   CO2 20 - 32 mmol/L _1 Calcium 8.6 - 10.3 mg/dL 9.7  8.9  8.4   Total Protein 6.1 - 8.1 g/dL 6.9  7.4    Total Bilirubin 0.2 - 1.2 mg/dL 0.6  0.4    Alkaline Phos 39 - 117 U/L  156    AST 10 - 35 U/L 13  14    ALT 9 - 46 U/L 12  19        RADIOGRAPHIC STUDIES: I have personally reviewed the radiological images as listed and agreed with the findings in the report. No results found.    No orders of the defined types were placed in this encounter.  All questions were answered. The patient knows to call the clinic with any problems, questions or concerns. No barriers to learning was detected. I spent 20 minutes counseling the patient face to face. The total time spent in the appointment was 25 minutes and more than 50% was on counseling and review of test results     Truitt Merle, MD 03/01/22

## 2022-03-01 NOTE — Telephone Encounter (Signed)
Spoke with pt via telephone to have confirm f/u appt with Vascular for doppler on LLE.  Pt's appt is 03/08/2022 @10am  instructed pt to arrive 15 mins prior to scheduled appt time to [facility] to allow for registration.  Pt verbalized understanding of instructions.  Pt had no further questions or concerns at this time.

## 2022-03-07 ENCOUNTER — Ambulatory Visit: Payer: Medicare Other | Admitting: Surgical

## 2022-03-07 VITALS — BP 124/86 | HR 84 | Resp 20 | Ht 64.0 in | Wt 158.0 lb

## 2022-03-07 DIAGNOSIS — Z09 Encounter for follow-up examination after completed treatment for conditions other than malignant neoplasm: Secondary | ICD-10-CM

## 2022-03-07 DIAGNOSIS — M009 Pyogenic arthritis, unspecified: Secondary | ICD-10-CM

## 2022-03-07 NOTE — Patient Instructions (Signed)
Dry sterile dressing daily

## 2022-03-07 NOTE — Progress Notes (Signed)
[address]       [place] [address]             [phone]           [address]       [place] [address]             [phone]      Hosie Sharman [facility] Medical Record #[id number] Date of Birth: September 11, 1955  Referring: [name], * Primary Care: [name], MD Primary Cardiologist: None   Chief Complaint:   POST OP FOLLOW UP  History of Present Illness:    David Strong is seen in follow-up in the wound continues to do very well.  He denies any pain associated with it.  He does sense a bit of a click type sensation in the clavicle itself.  He has had no recent fevers, chills or other significant constitutional symptoms.      Past Medical History:  Diagnosis Date   Diabetes mellitus without complication (HCC)      Social History   Tobacco Use  Smoking Status Former  Smokeless Tobacco Never    Social History   Substance and Sexual Activity  Alcohol Use Not Currently   Comment: occasional     Allergies  Allergen Reactions   Codeine Itching   Glipizide Er [Glipizide]     SOB, malaise. He tolerates XL without issues.   Metformin And Related Rash    Current Outpatient Medications  Medication Sig Dispense Refill   acetaminophen (TYLENOL) 325 MG tablet Take 2 tablets (650 mg total) by mouth every 6 (six) hours as needed for mild pain, fever or headache (or Fever >/= 101). (Patient not taking: Reported on 12/29/2021)     apixaban (ELIQUIS) 5 MG TABS tablet Take 1 tablet (5 mg total) by mouth 2 (two) times daily. 60 tablet 2   benzonatate (TESSALON) 200 MG capsule Take 1 capsule (200 mg total) by mouth 3 (three) times daily as needed for cough. 20 capsule 0   Camphor-Eucalyptus-Menthol (VICKS VAPORUB EX) Apply 1 application. topically daily as needed (chest congestion).     cefadroxil (DURICEF) 500 MG capsule Take 2 capsules (1,000 mg total) by mouth 2 (two) times daily. Start 6/02 when done  with iv ceftriaxone (Patient not taking: Reported on 02/22/2022) 120 capsule 0   CLINDAMYCIN HCL PO Take 500 mg by mouth 2 (two) times daily.     dapagliflozin propanediol (FARXIGA) 10 MG TABS tablet Take 1 tablet (10 mg total) by mouth daily. 90 tablet 1   furosemide (LASIX) 20 MG tablet Take 1 tablet (20 mg total) by mouth daily. 14 tablet 1   glipiZIDE (GLUCOTROL) 10 MG tablet Take 20 mg by mouth 2 (two) times daily.     glucose blood (ACCU-CHEK AVIVA PLUS) test strip Use to test blood glucose daily. 100 each 3   Insulin Pen Needle (PEN NEEDLES 3/16") 31G X 5 MM MISC 14 Units by Does not apply route 2 (two) times daily with a meal. 50 each 3   oxyCODONE (OXY IR/ROXICODONE) 5 MG immediate release tablet Take 1 tablet (5 mg total) by mouth 3 (three) times daily as needed for moderate pain or severe pain. (Patient not taking: Reported on 02/21/2022) 20 tablet 0   Phenol (EQ SORE THROAT SPRAY MT) Use as directed 1 spray in the mouth or throat daily as needed (sore throat).     polyethylene glycol (MIRALAX /  GLYCOLAX) 17 g packet Take 17 g by mouth daily as needed. 14 each 0   potassium chloride (KLOR-CON M) 10 MEQ tablet Take 1 tablet (10 mEq total) by mouth daily. 14 tablet 1   sildenafil (VIAGRA) 100 MG tablet Take 0.5-1 tablets (50-100 mg total) by mouth daily as needed for erectile dysfunction. 5 tablet 11   No current facility-administered medications for this visit.       Physical Exam: Ht 5\' 4"  (1.626 m)   BMI 27.34 kg/m   Wound: The wound is doing very well.  There is a small approximately 1 cm in diameter region of granulation tissue but otherwise the skin is closed.  There is no evidence of erythema or any drainage.   Diagnostic Studies & Laboratory data:     Recent Radiology Findings:   No results found.    Recent Lab Findings: Lab Results  Component Value Date   WBC 2.5 (L) 02/22/2022   HGB 14.4 02/22/2022   HCT 44.8 02/22/2022   PLT 149 02/22/2022   GLUCOSE 256 (H)  02/22/2022   CHOL 168 10/12/2021   TRIG 104.0 10/12/2021   HDL 44.10 10/12/2021   LDLDIRECT 116.0 03/28/2021   LDLCALC 103 (H) 10/12/2021   ALT 12 02/22/2022   AST 13 02/22/2022   NA 138 02/22/2022   K 4.3 02/22/2022   CL 100 02/22/2022   CREATININE 0.80 02/22/2022   BUN 12 02/22/2022   CO2 28 02/22/2022   TSH 3.110 05/06/2017   INR 1.5 (H) 12/04/2021   HGBA1C 8.6 (H) 11/23/2021      Assessment / Plan: We will change to daily dry 4 x 4's to keep the wound protected but there is no further area that requires packing.  We will see the patient again on on a as needed basis should the wound have any further difficulty.      Medication Changes: No orders of the defined types were placed in this encounter.     01/23/2022, PA-C  03/07/2022 1:42 PM

## 2022-03-08 ENCOUNTER — Telehealth: Payer: Self-pay

## 2022-03-08 ENCOUNTER — Ambulatory Visit (HOSPITAL_COMMUNITY)
Admission: RE | Admit: 2022-03-08 | Discharge: 2022-03-08 | Disposition: A | Discharge status: Home / Self Care | Payer: Medicare Other | Source: Ambulatory Visit | Attending: Hematology | Admitting: Hematology

## 2022-03-08 DIAGNOSIS — I82402 Acute embolism and thrombosis of unspecified deep veins of left lower extremity: Secondary | ICD-10-CM | POA: Diagnosis not present

## 2022-03-08 MED ORDER — APIXABAN 5 MG PO TABS: 5.0000 mg | ORAL_TABLET | Freq: Two times a day (BID) | ORAL | 3 refills | Status: DC

## 2022-03-08 NOTE — Telephone Encounter (Signed)
Spoke with pt via telephone regarding his recent doppler scan.  Pt was positive for DVT per Dr. Mosetta Putt.  Informed pt that his doppler was positive for DVT and Dr. Mosetta Putt wants him to continue taking the Apixaban for 3 more months.  Confirmed pt's preferred pharmacy was [facility] [address] and refill prescriptions sent.

## 2022-03-08 NOTE — Progress Notes (Signed)
Left lower extremity venous duplex has been completed. Preliminary results can be found in [facility] through chart review.  Results were given to Dr. Mosetta Putt.  03/08/22 10:04 AM Olen Cordial RVT

## 2022-03-10 ENCOUNTER — Other Ambulatory Visit: Payer: Self-pay | Admitting: Emergency Medicine

## 2022-03-10 DIAGNOSIS — E1165 Type 2 diabetes mellitus with hyperglycemia: Secondary | ICD-10-CM

## 2022-03-23 ENCOUNTER — Other Ambulatory Visit: Payer: Self-pay | Admitting: Thoracic Surgery (Cardiothoracic Vascular Surgery)

## 2022-03-23 DIAGNOSIS — M009 Pyogenic arthritis, unspecified: Secondary | ICD-10-CM

## 2022-03-26 ENCOUNTER — Ambulatory Visit: Payer: Medicare Other | Admitting: Thoracic Surgery (Cardiothoracic Vascular Surgery)

## 2022-03-26 ENCOUNTER — Encounter: Payer: Self-pay | Admitting: Thoracic Surgery (Cardiothoracic Vascular Surgery)

## 2022-03-26 ENCOUNTER — Telehealth: Payer: Self-pay | Admitting: Emergency Medicine

## 2022-03-26 VITALS — BP 126/83 | HR 80 | Resp 20 | Ht 64.0 in | Wt 162.8 lb

## 2022-03-26 DIAGNOSIS — M009 Pyogenic arthritis, unspecified: Secondary | ICD-10-CM | POA: Diagnosis not present

## 2022-03-26 NOTE — Progress Notes (Signed)
[address]       [place] [address]             [phone]     HPI: [name] returns for follow-up after I&D of the sternoclavicular joint abscess.  David Strong is a 66 year old man admitted last spring with pneumonia and polyarthritis.  He had a septic arthritis of the left sternoclavicular joint with an abscess extending into the neck and of the pectoralis.  His joint was essentially destroyed.  I did an I&D and VAC placement on 12/04/2021.  I then did the first couple of dressing changes and debridements under anesthesia.  He grew pneumococcus from his blood.  TEE was negative for any evidence of endocarditis.  He was also diagnosed with DVT and started on Eliquis while he was in the hospital.  His wound has finally healed completely.  He still feels movement of the head of the clavicle when he uses his left arm but is not having any pain.  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  He was told by Dr. Magnus Ivan he is eventually going to need a shoulder replacement.  He does not want to do that yet.  He had questions about any restrictions on his activities. 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)    Severe sepsis (HCC) 11/23/2021   Pneumococcal bacteremia 11/23/2021   Hypokalemia 11/23/2021   Hypophosphatemia 11/23/2021   CAP (community acquired pneumonia) 11/22/2021   High anion gap metabolic acidosis 11/22/2021   Septic arthritis (HCC) 11/22/2021   Transaminitis 11/22/2021   Right inguinal hernia 10/12/2021   Type 2 diabetes mellitus (HCC) 06/07/2015   Glucose intolerance (impaired glucose tolerance) 08/03/2012   Leucopenia 08/03/2012   Thrombocytopenia (HCC)  08/03/2012      Current Outpatient Medications  Medication Sig Dispense Refill   acetaminophen (TYLENOL) 325 MG tablet Take 2 tablets (650 mg total) by mouth every 6 (six) hours as needed for mild pain, fever or headache (or Fever >/= 101).     apixaban (ELIQUIS) 5 MG TABS tablet Take 1 tablet (5 mg total) by mouth 2 (two) times daily. 60 tablet 3   benzonatate (TESSALON) 200 MG capsule Take 1 capsule (200 mg total) by mouth 3 (three) times daily as needed for cough. 20 capsule 0   Camphor-Eucalyptus-Menthol (VICKS VAPORUB EX) Apply 1 application. topically daily as needed (chest congestion).     CLINDAMYCIN HCL PO Take 500 mg by mouth 2 (two) times daily.     dapagliflozin propanediol (FARXIGA) 10 MG TABS tablet Take 1 tablet (10 mg total) by mouth daily. 90 tablet 1   furosemide (LASIX) 20 MG tablet Take 1 tablet (20 mg total) by mouth daily. 14 tablet 1   glipiZIDE (GLUCOTROL XL) 10 MG 24 hr tablet TAKE 1 TABLET (10 MG TOTAL) BY MOUTH 2 (TWO) TIMES DAILY WITH A MEAL. 180 tablet 3   glipiZIDE (GLUCOTROL) 10 MG tablet Take 20 mg by mouth 2 (two) times daily.     glucose blood (ACCU-CHEK AVIVA PLUS) test strip Use to test blood glucose daily. 100 each 3   Insulin Pen Needle (PEN NEEDLES  3/16") 31G X 5 MM MISC 14 Units by Does not apply route 2 (two) times daily with a meal. 50 each 3   oxyCODONE (OXY IR/ROXICODONE) 5 MG immediate release tablet Take 1 tablet (5 mg total) by mouth 3 (three) times daily as needed for moderate pain or severe pain. 20 tablet 0   Phenol (EQ SORE THROAT SPRAY MT) Use as directed 1 spray in the mouth or throat daily as needed (sore throat).     polyethylene glycol (MIRALAX / GLYCOLAX) 17 g packet Take 17 g by mouth daily as needed. 14 each 0   potassium chloride (KLOR-CON M) 10 MEQ tablet Take 1 tablet (10 mEq total) by mouth daily. 14 tablet 1   sildenafil (VIAGRA) 100 MG tablet Take 0.5-1 tablets (50-100 mg total) by mouth daily as needed for erectile dysfunction. 5  tablet 11   No current facility-administered medications for this visit.    Physical Exam BP 126/83 (BP Location: Right Arm, Patient Position: Sitting, Cuff Size: Normal)   Pulse 80   Resp 20   Ht 5\' 4"  (1.626 m)   Wt 162 lb 12.8 oz (73.8 kg)   SpO2 99% Comment: RA  BMI 27.63 kg/m  66 year old man in no acute distress Alert and oriented x 3 with no focal deficits Head of the left clavicle mobile.  Wound well-healed.  Diagnostic Tests: None  Impression: David Strong is a 66 year old man who had Streptococcus pneumonia bacteremia with a left sternoclavicular joint abscess.  He had an extensive abscess which essentially destroyed the joint extended into the neck and under the pectoralis.  That required I&D and VAC placement.  It took a couple of months but the wound has completely healed at this point.  His left clavicle is mobile.  He is not having any pain associated with that.  If he does develop pain we could refer back to orthopedics for consideration of some kind of fixation of the joint, but that is not something that I would do as a CT 76.  There are no restrictions on his activities from my standpoint.  His lifting is limited more by pain in the right shoulder than any other issues.  He is being followed by Dr. Careers adviser for that.  Plan: I will be happy to see [name] back at any time if I can be of any assistance with his care  Magnus Ivan, MD [facility] [phone]

## 2022-03-26 NOTE — Telephone Encounter (Signed)
Called patient and informed patient that his rx for the [place]. RX sent to pharmacy in May for 90 tabs 1 refill. [facility] to [facility]ill

## 2022-03-26 NOTE — Telephone Encounter (Signed)
Patient is out of his Marcelline Deist - Please send to [facility] on [address] - [place], [place]

## 2022-03-28 DIAGNOSIS — Z789 Other specified health status: Secondary | ICD-10-CM

## 2022-03-28 NOTE — Progress Notes (Signed)
[facility])                                            [facility] Quality Pharmacy Team                                        Statin Quality Measure Assessment    03/28/2022  David Strong Nov 21, 1955 [id number]  Per review of chart and payor information, this patient has been flagged for non-adherence to the following CMS Quality Measure:   [x]  Statin Use in Persons with Diabetes  []  Statin Use in Persons with Cardiovascular Disease  The 10-year ASCVD risk score (Arnett DK, et al., 2019) is: 17.4%   Values used to calculate the score:     Age: 66 years     Sex: Male     Is Non-Hispanic African American: Yes     Diabetic: Yes     Tobacco smoker: No     Systolic Blood Pressure: 126 mmHg     Is BP treated: No     HDL Cholesterol: 44.1 mg/dL     Total Cholesterol: 168 mg/dL   This patient is failing SUPD CMS measure d/t prior documentation of statin causing needle-like head pain and aches all over body. No antihyperlipidemic on file. Next appointment with PCP is 03/29/2022. If deemed clinically appropriate, please consider associating an exclusion code at the next office visit (see options below) and/or assess statin re-challenge.   Please consider ONE of the following recommendations:   Initiate high intensity statin Atorvastatin 40mg  once daily, #90, 3 refills   Rosuvastatin 20mg  once daily, #90, 3 refills    Initiate moderate intensity          statin with reduced frequency if prior          statin intolerance 1x weekly, #13, 3 refills   2x weekly, #26, 3 refills   3x weekly, #39, 3 refills   Code for past statin intolerance or other exclusions (required annually)  Drug Induced Myopathy G72.0   Myositis, unspecified M60.9   Rhabdomyolysis M62.82   Cirrhosis of liver K74.69   Biliary cirrhosis, unspecified K74.5   Abnormal blood glucose - for SUPD ONLY R73.09   Prediabetes - for SUPD ONLY  R73.03   Thank you for your  time,  76, PharmD Clinical Pharmacist [facility] Cell: [phone]

## 2022-03-29 ENCOUNTER — Ambulatory Visit (INDEPENDENT_AMBULATORY_CARE_PROVIDER_SITE_OTHER): Payer: Medicare Other | Admitting: Emergency Medicine

## 2022-03-29 ENCOUNTER — Ambulatory Visit: Payer: Medicare Other | Admitting: Internal Medicine

## 2022-03-29 ENCOUNTER — Encounter: Payer: Self-pay | Admitting: Emergency Medicine

## 2022-03-29 VITALS — BP 110/74 | HR 81 | Temp 98.3°F | Ht 64.0 in | Wt 162.2 lb

## 2022-03-29 DIAGNOSIS — E1165 Type 2 diabetes mellitus with hyperglycemia: Secondary | ICD-10-CM | POA: Diagnosis not present

## 2022-03-29 DIAGNOSIS — G8929 Other chronic pain: Secondary | ICD-10-CM | POA: Diagnosis not present

## 2022-03-29 DIAGNOSIS — M25562 Pain in left knee: Secondary | ICD-10-CM

## 2022-03-29 LAB — POCT GLYCOSYLATED HEMOGLOBIN (HGB A1C): Hemoglobin A1C: 9.1 % — AB (ref 4.0–5.6)

## 2022-03-29 MED ORDER — ROSUVASTATIN CALCIUM 10 MG PO TABS: 10.0000 mg | ORAL_TABLET | Freq: Every day | ORAL | 3 refills | Status: DC

## 2022-03-29 MED ORDER — RYBELSUS 7 MG PO TABS: 7.0000 mg | ORAL_TABLET | Freq: Every day | ORAL | 1 refills | Status: DC

## 2022-03-29 NOTE — Progress Notes (Signed)
David Strong 66 y.o.   Chief Complaint  Patient presents with   Follow-up    3 mnth f/u appt    Knee Pain    Left knee pain     HISTORY OF PRESENT ILLNESS: This is a 66 y.o. male A1A here for 94-month follow-up of diabetes.  Presently on glipizide and Farxiga. Complaining of pain to left knee for couple months. No other complaints or medical concerns today.  Knee Pain      Prior to Admission medications   Medication Sig Start Date End Date Taking? Authorizing Provider  acetaminophen (TYLENOL) 325 MG tablet Take 2 tablets (650 mg total) by mouth every 6 (six) hours as needed for mild pain, fever or headache (or Fever >/= 101). 12/02/21   Meredeth Ide, MD  apixaban (ELIQUIS) 5 MG TABS tablet Take 1 tablet (5 mg total) by mouth 2 (two) times daily. 03/08/22 06/06/22  Malachy Mood, MD  benzonatate (TESSALON) 200 MG capsule Take 1 capsule (200 mg total) by mouth 3 (three) times daily as needed for cough. Patient not taking: Reported on 03/29/2022 12/02/21   Meredeth Ide, MD  Camphor-Eucalyptus-Menthol [facility] VAPORUB EX) Apply 1 application. topically daily as needed (chest congestion).    [provider]  CLINDAMYCIN HCL PO Take 500 mg by mouth 2 (two) times daily.    [provider]  dapagliflozin propanediol (FARXIGA) 10 MG TABS tablet Take 1 tablet (10 mg total) by mouth daily. 01/09/22   [name], MD  furosemide (LASIX) 20 MG tablet Take 1 tablet (20 mg total) by mouth daily. Patient not taking: Reported on 03/29/2022 12/26/21   [name], MD  glipiZIDE (GLUCOTROL XL) 10 MG 24 hr tablet TAKE 1 TABLET (10 MG TOTAL) BY MOUTH 2 (TWO) TIMES DAILY WITH A MEAL. 03/12/22 06/10/22  [name], MD  glipiZIDE (GLUCOTROL) 10 MG tablet Take 20 mg by mouth 2 (two) times daily.    [provider]  glucose blood (ACCU-CHEK AVIVA PLUS) test strip Use to test blood glucose daily. 07/20/21   [name], MD  Insulin Pen Needle (PEN NEEDLES  3/16") 31G X 5 MM MISC 14 Units by Does not apply route 2 (two) times daily with a meal. Patient not taking: Reported on 03/29/2022 12/02/21   Meredeth Ide, MD  oxyCODONE (OXY IR/ROXICODONE) 5 MG immediate release tablet Take 1 tablet (5 mg total) by mouth 3 (three) times daily as needed for moderate pain or severe pain. 01/03/22   [name], MD  Phenol (EQ SORE THROAT SPRAY MT) Use as directed 1 spray in the mouth or throat daily as needed (sore throat). Patient not taking: Reported on 03/29/2022    [provider]  polyethylene glycol (MIRALAX / GLYCOLAX) 17 g packet Take 17 g by mouth daily as needed. Patient not taking: Reported on 03/29/2022 12/02/21   Meredeth Ide, MD  potassium chloride (KLOR-CON M) 10 MEQ tablet Take 1 tablet (10 mEq total) by mouth daily. 12/26/21   [name], MD  sildenafil (VIAGRA) 100 MG tablet Take 0.5-1 tablets (50-100 mg total) by mouth daily as needed for erectile dysfunction. Patient not taking: Reported on 03/29/2022 10/12/21   [name], MD    Allergies  Allergen Reactions   Codeine Itching   Glipizide Er [Glipizide]     SOB, malaise. He tolerates XL without issues.   Metformin And Related Rash    Patient Active Problem List   Diagnosis  Date Noted   Septic prepatellar bursitis of left knee    Pyomyositis    Cellulitis    Obese 12/05/2021   Bilateral leg edema 11/28/2021   DVT (deep venous thrombosis) (HCC) 11/27/2021   Constipation 11/27/2021   Septic arthritis of left sternoclavicular joint (HCC)    Severe sepsis (HCC) 11/23/2021   Pneumococcal bacteremia 11/23/2021   Hypokalemia 11/23/2021   Hypophosphatemia 11/23/2021   CAP (community acquired pneumonia) 11/22/2021   High anion gap metabolic acidosis 11/22/2021   Septic arthritis (HCC) 11/22/2021   Transaminitis 11/22/2021   Right inguinal hernia 10/12/2021   Type 2 diabetes mellitus (HCC) 06/07/2015   Glucose intolerance (impaired glucose tolerance)  08/03/2012   Leucopenia 08/03/2012   Thrombocytopenia (HCC) 08/03/2012    Past Medical History:  Diagnosis Date   Diabetes mellitus without complication [facility](HCC)     Past Surgical History:  Procedure Laterality Date   APPLICATION OF WOUND VAC Left 12/07/2021   Procedure: WOUND VAC CHANGE;  Surgeon: Loreli SlotHendrickson, Steven C, MD;  [facility];  Service: Vascular;  Laterality: Left;   INCISION AND DRAINAGE OF WOUND Left 12/04/2021   Procedure: IRRIGATION AND DEBRIDMENT OF STERNOCLAVICULAR WOUND, WOUND VAC PLACMENT.;  Surgeon: Loreli SlotHendrickson, Steven C, MD;  Location: [facility] OR;  Service: Vascular;  Laterality: Left;   IRRIGATION AND DEBRIDEMENT ABSCESS Left 12/07/2021   Procedure: IRRIGATION AND DEBRIDEMENT ABSCESS;  Surgeon: Loreli SlotHendrickson, Steven C, MD;  [facility];  Service: Vascular;  Laterality: Left;   TEE WITHOUT CARDIOVERSION N/A 12/08/2021   Procedure: TRANSESOPHAGEAL ECHOCARDIOGRAM (TEE);  Surgeon: Christell Constanthandrasekhar, Mahesh A, MD;  [facility];  Service: Cardiovascular;  Laterality: N/A;    Social History   Socioeconomic History   Marital status: Married    Spouse name: Not on file   Number of children: Not on file   Years of education: Not on file   Highest education level: Not on file  Occupational History   Not on file  Tobacco Use   Smoking status: Former   Smokeless tobacco: Never  Substance and Sexual Activity   Alcohol use: Not Currently    Comment: occasional   Drug use: No   Sexual activity: Yes  Other Topics Concern   Not on file  Social History Narrative   Not on file   Social Determinants of Health   Financial Resource Strain: Not on file  Food Insecurity: No Food Insecurity (12/28/2021)   Hunger Vital Sign    Worried About Running Out of Food in the Last Year: Never true    Ran Out of Food in the Last Year: Never true  Transportation Needs: No Transportation Needs (12/28/2021)   PRAPARE - Administrator, Civil ServiceTransportation    Lack of Transportation (Medical): No     Lack of Transportation (Non-Medical): No  Physical Activity: Not on file  Stress: Not on file  Social Connections: Not on file  Intimate Partner Violence: Not on file    Family History  Adopted: Yes     Review of Systems  Constitutional: Negative.  Negative for chills and fever.  HENT: Negative.  Negative for congestion and sore throat.   Respiratory: Negative.  Negative for cough and shortness of breath.   Cardiovascular: Negative.  Negative for chest pain and palpitations.  Gastrointestinal:  Negative for abdominal pain, diarrhea, nausea and vomiting.  Genitourinary: Negative.   Musculoskeletal:  Positive for joint pain (Left knee pain).  Skin: Negative.  Negative for rash.  Neurological: Negative.  Negative for dizziness and headaches.  All other  systems reviewed and are negative.  Today's Vitals   03/29/22 1310  BP: 110/74  Pulse: 81  Temp: 98.3 F (36.8 C)  TempSrc: Oral  SpO2: 98%  Weight: 162 lb 4 oz (73.6 kg)  Height: 5\' 4"  (1.626 m)   Body mass index is 27.85 kg/m.   Physical Exam Vitals reviewed.  Constitutional:      Appearance: Normal appearance.  HENT:     Head: Normocephalic.     Mouth/Throat:     Mouth: Mucous membranes are moist.     Pharynx: Oropharynx is clear.  Eyes:     Extraocular Movements: Extraocular movements intact.     Pupils: Pupils are equal, round, and reactive to light.  Cardiovascular:     Rate and Rhythm: Normal rate and regular rhythm.     Pulses: Normal pulses.     Heart sounds: Normal heart sounds.  Pulmonary:     Effort: Pulmonary effort is normal.     Breath sounds: Normal breath sounds.  Musculoskeletal:     Cervical back: No tenderness.     Comments: Left knee: No swelling.  No tenderness.  Full range of motion.  No significant abnormal findings.  Lymphadenopathy:     Cervical: No cervical adenopathy.  Skin:    General: Skin is warm and dry.     Capillary Refill: Capillary refill takes less than 2 seconds.   Neurological:     General: No focal deficit present.     Mental Status: He is alert and oriented to person, place, and time.  Psychiatric:        Mood and Affect: Mood normal.        Behavior: Behavior normal.    Results for orders placed or performed in visit on 03/29/22 (from the past 24 hour(s))  POCT HgB A1C     Status: Abnormal   Collection Time: 03/29/22  1:21 PM  Result Value Ref Range   Hemoglobin A1C 9.1 (A) 4.0 - 5.6 %   HbA1c POC (<> result, manual entry)     HbA1c, POC (prediabetic range)     HbA1c, POC (controlled diabetic range)       ASSESSMENT & PLAN: A total of 47 minutes was spent with the patient and counseling/coordination of care regarding preparing for this visit, review of most recent office visit notes, review of all medications and changes made, cardiovascular risks associated with uncontrolled diabetes, education on nutrition, prognosis, documentation, and need for follow-up.  Problem List Items Addressed This Visit       Endocrine   Type 2 diabetes mellitus (HCC) - Primary    Uncontrolled diabetes with hemoglobin A1c of 9.1 Lab Results  Component Value Date   HGBA1C 9.1 (A) 03/29/2022  Diet and nutrition discussed. Cardiovascular risks associated with uncontrolled diabetes discussed. Continue glipizide XL 10 mg daily and Farxiga 10 mg daily. Wants to avoid injectables. Will try Rybelsus 7 mg p.o. daily. Also start rosuvastatin 10 mg daily. Follow-up in 3 months. The 10-year ASCVD risk score (Arnett DK, et al., 2019) is: 13.9%   Values used to calculate the score:     Age: 39 years     Sex: Male     Is Non-Hispanic African American: Yes     Diabetic: Yes     Tobacco smoker: No     Systolic Blood Pressure: 110 mmHg     Is BP treated: No     HDL Cholesterol: 44.1 mg/dL     Total Cholesterol: 168 mg/dL  Relevant Medications   Semaglutide (RYBELSUS) 7 MG TABS   rosuvastatin (CRESTOR) 10 MG tablet   Other Relevant Orders   POCT HgB  A1C (Completed)     Other   Chronic pain of left knee    Unremarkable exam.  No red flag signs or symptoms. Needs orthopedic evaluation. Referral to sports medicine placed today.      Relevant Orders   Ambulatory referral to Sports Medicine    Patient Instructions  Diabetes Mellitus and Nutrition, Adult When you have diabetes, or diabetes mellitus, it is very important to have healthy eating habits because your blood sugar (glucose) levels are greatly affected by what you eat and drink. Eating healthy foods in the right amounts, at about the same times every day, can help you: Manage your blood glucose. Lower your risk of heart disease. Improve your blood pressure. Reach or maintain a healthy weight. What can affect my meal plan? Every person with diabetes is different, and each person has different needs for a meal plan. Your health care provider may recommend that you work with a dietitian to make a meal plan that is best for you. Your meal plan may vary depending on factors such as: The calories you need. The medicines you take. Your weight. Your blood glucose, blood pressure, and cholesterol levels. Your activity level. Other health conditions you have, such as heart or kidney disease. How do carbohydrates affect me? Carbohydrates, also called carbs, affect your blood glucose level more than any other type of food. Eating carbs raises the amount of glucose in your blood. It is important to know how many carbs you can safely have in each meal. This is different for every person. Your dietitian can help you calculate how many carbs you should have at each meal and for each snack. How does alcohol affect me? Alcohol can cause a decrease in blood glucose (hypoglycemia), especially if you use insulin or take certain diabetes medicines by mouth. Hypoglycemia can be a life-threatening condition. Symptoms of hypoglycemia, such as sleepiness, dizziness, and confusion, are similar to  symptoms of having too much alcohol. Do not drink alcohol if: Your health care provider tells you not to drink. You are pregnant, may be pregnant, or are planning to become pregnant. If you drink alcohol: Limit how much you have to: 0-1 drink a day for women. 0-2 drinks a day for men. Know how much alcohol is in your drink. In the [place] one drink equals one 12 oz bottle of beer (355 mL), one 5 oz glass of wine (148 mL), or one 1 oz glass of hard liquor (44 mL). Keep yourself hydrated with water, diet soda, or unsweetened iced tea. Keep in mind that regular soda, juice, and other mixers may contain a lot of sugar and must be counted as carbs. What are tips for following this plan?  Reading food labels Start by checking the serving size on the Nutrition Facts label of packaged foods and drinks. The number of calories and the amount of carbs, fats, and other nutrients listed on the label are based on one serving of the item. Many items contain more than one serving per package. Check the total grams (g) of carbs in one serving. Check the number of grams of saturated fats and trans fats in one serving. Choose foods that have a low amount or none of these fats. Check the number of milligrams (mg) of salt (sodium) in one serving. Most people should limit total sodium intake to  less than 2,300 mg per day. Always check the nutrition information of foods labeled as "low-fat" or "nonfat." These foods may be higher in added sugar or refined carbs and should be avoided. Talk to your dietitian to identify your daily goals for nutrients listed on the label. Shopping Avoid buying canned, pre-made, or processed foods. These foods tend to be high in fat, sodium, and added sugar. Shop around the outside edge of the grocery store. This is where you will most often find fresh fruits and vegetables, bulk grains, fresh meats, and fresh dairy products. Cooking Use low-heat cooking methods, such as baking, instead  of high-heat cooking methods, such as deep frying. Cook using healthy oils, such as olive, canola, or sunflower oil. Avoid cooking with butter, cream, or high-fat meats. Meal planning Eat meals and snacks regularly, preferably at the same times every day. Avoid going long periods of time without eating. Eat foods that are high in fiber, such as fresh fruits, vegetables, beans, and whole grains. Eat 4-6 oz (112-168 g) of lean protein each day, such as lean meat, chicken, fish, eggs, or tofu. One ounce (oz) (28 g) of lean protein is equal to: 1 oz (28 g) of meat, chicken, or fish. 1 egg.  cup (62 g) of tofu. Eat some foods each day that contain healthy fats, such as avocado, nuts, seeds, and fish. What foods should I eat? Fruits Berries. Apples. Oranges. Peaches. Apricots. Plums. Grapes. Mangoes. Papayas. Pomegranates. Kiwi. Cherries. Vegetables Leafy greens, including lettuce, spinach, kale, chard, collard greens, mustard greens, and cabbage. Beets. Cauliflower. Broccoli. Carrots. Green beans. Tomatoes. Peppers. Onions. Cucumbers. Brussels sprouts. Grains Whole grains, such as whole-wheat or whole-grain bread, crackers, tortillas, cereal, and pasta. Unsweetened oatmeal. Quinoa. Brown or wild rice. Meats and other proteins Seafood. Poultry without skin. Lean cuts of poultry and beef. Tofu. Nuts. Seeds. Dairy Low-fat or fat-free dairy products such as milk, yogurt, and cheese. The items listed above may not be a complete list of foods and beverages you can eat and drink. Contact a dietitian for more information. What foods should I avoid? Fruits Fruits canned with syrup. Vegetables Canned vegetables. Frozen vegetables with butter or cream sauce. Grains Refined white flour and flour products such as bread, pasta, snack foods, and cereals. Avoid all processed foods. Meats and other proteins Fatty cuts of meat. Poultry with skin. Breaded or fried meats. Processed meat. Avoid saturated  fats. Dairy Full-fat yogurt, cheese, or milk. Beverages Sweetened drinks, such as soda or iced tea. The items listed above may not be a complete list of foods and beverages you should avoid. Contact a dietitian for more information. Questions to ask a health care provider Do I need to meet with a certified diabetes care and education specialist? Do I need to meet with a dietitian? What number can I call if I have questions? When are the best times to check my blood glucose? Where to find more information: American Diabetes Association: diabetes.org Academy of Nutrition and Dietetics: eatright.[employer] of Diabetes and Digestive and Kidney Diseases: StageSync.si Association of Diabetes Care & Education Specialists: diabeteseducator.org Summary It is important to have healthy eating habits because your blood sugar (glucose) levels are greatly affected by what you eat and drink. It is important to use alcohol carefully. A healthy meal plan will help you manage your blood glucose and lower your risk of heart disease. Your health care provider may recommend that you work with a dietitian to make a meal plan that is  best for you. This information is not intended to replace advice given to you by your health care provider. Make sure you discuss any questions you have with your health care provider. Document Revised: 03/09/2020 Document Reviewed: 03/09/2020 Elsevier Patient Education  2023 Elsevier Inc.    Edwina Barth, MD [facility]

## 2022-03-29 NOTE — Assessment & Plan Note (Signed)
Uncontrolled diabetes with hemoglobin A1c of 9.1 Lab Results  Component Value Date   HGBA1C 9.1 (A) 03/29/2022  Diet and nutrition discussed. Cardiovascular risks associated with uncontrolled diabetes discussed. Continue glipizide XL 10 mg daily and Farxiga 10 mg daily. Wants to avoid injectables. Will try Rybelsus 7 mg p.o. daily. Also start rosuvastatin 10 mg daily. Follow-up in 3 months. The 10-year ASCVD risk score (Arnett DK, et al., 2019) is: 13.9%   Values used to calculate the score:     Age: 66 years     Sex: Male     Is Non-Hispanic African American: Yes     Diabetic: Yes     Tobacco smoker: No     Systolic Blood Pressure: 110 mmHg     Is BP treated: No     HDL Cholesterol: 44.1 mg/dL     Total Cholesterol: 168 mg/dL

## 2022-03-29 NOTE — Patient Instructions (Signed)

## 2022-03-29 NOTE — Assessment & Plan Note (Addendum)
Unremarkable exam.  No red flag signs or symptoms. Needs orthopedic evaluation. Referral to sports medicine placed today.

## 2022-04-02 NOTE — Telephone Encounter (Signed)
NOTE NOT NEEDED ?

## 2022-04-03 ENCOUNTER — Ambulatory Visit (INDEPENDENT_AMBULATORY_CARE_PROVIDER_SITE_OTHER): Payer: Medicare Other | Admitting: Family Medicine

## 2022-04-03 ENCOUNTER — Ambulatory Visit: Payer: Self-pay

## 2022-04-03 VITALS — BP 122/78 | HR 86 | Ht 64.0 in | Wt 165.2 lb

## 2022-04-03 DIAGNOSIS — M25562 Pain in left knee: Secondary | ICD-10-CM

## 2022-04-03 DIAGNOSIS — M25511 Pain in right shoulder: Secondary | ICD-10-CM

## 2022-04-03 DIAGNOSIS — G8929 Other chronic pain: Secondary | ICD-10-CM | POA: Diagnosis not present

## 2022-04-03 NOTE — Patient Instructions (Addendum)
Thank you for coming in today.   I've referred you to Physical Therapy.  Let us know if you don't hear from them in one week.   Recheck in 6 weeks.   Please use Voltaren gel (Generic Diclofenac Gel) up to 4x daily for pain as needed.  This is available over-the-counter as both the name brand Voltaren gel and the generic diclofenac gel.   Let me know if this is not working.

## 2022-04-03 NOTE — Progress Notes (Signed)
I, Philbert Riser, LAT, ATC acting as a scribe for Clementeen Graham, MD.  Subjective:    CC: L knee pain  HPI: Pt is a 66 y/o male c/o L knee pain ongoing since April. Of note, pt had a STAT LE vascular US on 03/08/22 revealing L LE DVT that was discovered when he was hospitalized for a "cold." Pt reports while at the hospital a doctor "drilled" along the lateral aspect of the L knee. Pt locates pain to within the L knee joint.  L Knee swelling: no Mechanical symptoms: yes Radiates: no Aggravates: knee flexion Treatments tried: cream,   Pt also c/o R shoulder pain also ongoing since April. Pt locates pain to the posterior aspect of the R shoulder and into R trapz.  Neck pain: no Aggravates: shoulder ABD  Dx imaging: 03/08/22 LE vascular US 02/13/22 L knee XR 12/09/21 L knee MRI  Pertinent review of Systems: No fevers or chills  Relevant historical information: Diabetes.  History of sepsis and a septic joint at the left [place] joint.   Objective:    Vitals:   04/03/22 0954  BP: 122/78  Pulse: 86  SpO2: 97%   General: Well Developed, well nourished, and in no acute distress.   MSK: Left knee: Normal-appearing without significant joint effusion.  Normal motion with crepitation.  Tender palpation medial joint line. Stable ligamentous exam. Intact strength.  Right shoulder: Normal appearing Decreased shoulder range of motion.  Abduction limited to 90 degrees.  External rotation 30 degrees beyond neutral position.  Internal rotation posterior iliac crest. Strength intact but slightly reduced with pain.  Lab and Radiology Results  Diagnostic Limited MSK Ultrasound of: Left knee Quad tendon intact normal. Minimal or trace joint effusion present superior patellar space. Patellar tendon normal. Medial joint line narrowed degenerative. Lateral joint line mildly degenerative appearing Posterior knee no Baker's cyst.  No significant fluid clearly chelation along the semitendinosis  and semimembranosus. Impression: Mild DJD.  No evidence of large joint effusion  X-ray images left knee and right shoulder obtained at orthopedics on June 27 personally and independently interpreted.  Left knee mild DJD.   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    Impression and Recommendations:    Assessment and Plan: 66 y.o. male with  Left knee pain.  Pain thought to be due to exacerbation of DJD.  He recently had sepsis and a septic [place] joint.  There was some concern that he may have had a septic left knee but subsequent aspiration around the time of his sepsis did not support this diagnosis.  Today I do not think he has a septic joint either as he does not have a joint effusion fever or chills.  We discussed options.  He would like to proceed with trial of physical therapy which I think is a good idea.  Recommend PT Voltaren gel and Tylenol.  Avoid oral NSAIDs given his Eliquis.  Right shoulder pain.  This is due to the more significant DJD that he has present on prior x-ray.  Ultimately he is going to need a shoulder replacement but I agree with Dr. Eliberto Ivory assessment in June that we should wait a bit before proceeding with joint replacement until we can be more certain that he is absolutely cleared of any potential infection as his risk for developing a septic prosthetic joint is high. We discussed options for now.  He would like to again try physical therapy which is reasonable.  A steroid injection is  reasonable at some time as well.  Recheck in 6 weeks. Marland Kitchen  PDMP not reviewed this encounter. Orders Placed This Encounter  Procedures   [place] LIMITED JOINT SPACE STRUCTURES LOW LEFT(NO LINKED CHARGES)    Order Specific Question:   Reason for Exam (SYMPTOM  OR DIAGNOSIS REQUIRED)    Answer:   left knee pain    Order Specific Question:   Preferred imaging location?    Answer:   [facility] referral to Physical Therapy    Referral  Priority:   Routine    Referral Type:   Physical Medicine    Referral Reason:   Specialty Services Required    Requested Specialty:   Physical Therapy    Number of Visits Requested:   1   No orders of the defined types were placed in this encounter.   Discussed warning signs or symptoms. Please see discharge instructions. Patient expresses understanding.   The above documentation has been reviewed and is accurate and complete Clementeen Graham, M.D.

## 2022-04-04 ENCOUNTER — Ambulatory Visit: Payer: Medicare Other | Admitting: Family Medicine

## 2022-04-06 ENCOUNTER — Ambulatory Visit: Payer: Medicare Other | Admitting: Internal Medicine

## 2022-04-06 ENCOUNTER — Telehealth: Payer: Self-pay

## 2022-04-06 NOTE — Telephone Encounter (Signed)
Pt is asking for a alterative for Semaglutide (RYBELSUS) 7 MG TABS pt states its like 700 plus with insurance.   Also he is asking if that he can start to take a vital proteins collagen peptides supplement to joints with the other medications that he is taking?  Please advise

## 2022-04-09 ENCOUNTER — Other Ambulatory Visit: Payer: Self-pay | Admitting: Emergency Medicine

## 2022-04-09 DIAGNOSIS — E1165 Type 2 diabetes mellitus with hyperglycemia: Secondary | ICD-10-CM

## 2022-04-09 MED ORDER — TRULICITY 0.75 MG/0.5ML ~~LOC~~ SOAJ: 0.7500 mg | SUBCUTANEOUS | 3 refills | Status: DC

## 2022-04-09 NOTE — Telephone Encounter (Signed)
Okay to start.  Thanks.

## 2022-04-09 NOTE — Telephone Encounter (Signed)
Pt is asking to start Trulicity again in place of Semaglutide (RYBELSUS) 7 MG TABS.  Please advise

## 2022-04-09 NOTE — Telephone Encounter (Signed)
Called patient to inform him of provider response 

## 2022-04-09 NOTE — Telephone Encounter (Signed)
New prescription for Trulicity 0.75 mg weekly sent to pharmacy of record.  Thanks.

## 2022-04-09 NOTE — Telephone Encounter (Signed)
Okay to restart Trulicity.  Thanks.

## 2022-04-11 ENCOUNTER — Encounter: Payer: Self-pay | Admitting: Internal Medicine

## 2022-04-11 ENCOUNTER — Ambulatory Visit: Payer: Medicare Other | Admitting: Internal Medicine

## 2022-04-11 ENCOUNTER — Other Ambulatory Visit: Payer: Self-pay

## 2022-04-11 DIAGNOSIS — M0019 Pneumococcal polyarthritis: Secondary | ICD-10-CM | POA: Diagnosis not present

## 2022-04-11 DIAGNOSIS — E1100 Type 2 diabetes mellitus with hyperosmolarity without nonketotic hyperglycemic-hyperosmolar coma (NKHHC): Secondary | ICD-10-CM | POA: Diagnosis not present

## 2022-04-11 DIAGNOSIS — M0010 Pneumococcal arthritis, unspecified joint: Secondary | ICD-10-CM | POA: Insufficient documentation

## 2022-04-11 NOTE — Assessment & Plan Note (Signed)
Now off antibiotics about 1 month and doing well.  No clinical concerns.  Will check his inflammatory markers and he can otherwise rtc as neede.d

## 2022-04-11 NOTE — Assessment & Plan Note (Signed)
Will need continued good DM management to reduce infection risk.  Discussed with the patient.

## 2022-04-11 NOTE — Progress Notes (Signed)
   Subjective:    Patient ID: David Strong, male    DOB: [birth year], 66 y.o.   MRN: [id number]  HPI He is here for follow up of a disseminated Streptococcal infection including the clavicle s/p debridement by Dr. Dorris Fetch and left knee and right shoulder inflammation.  He is s/p prolonged treatment with ceftriaxone followed by oral cefadroxil and now off antibiotics about 4-5 weeks.  No new issues.  Starting PT for his left knee and clavicle incision healed nicely.     Review of Systems  Constitutional:  Negative for chills, fatigue and fever.  Gastrointestinal:  Negative for diarrhea.  Skin:  Negative for rash.       Objective:   Physical Exam Eyes:     General: No scleral icterus. Pulmonary:     Effort: Pulmonary effort is normal.  Skin:    Comments: Left clavicle scar noted, closed, no erythema, no tenderness.   Neurological:     Mental Status: He is alert.           Assessment & Plan:

## 2022-04-12 LAB — SEDIMENTATION RATE: Sed Rate: 2 mm/h (ref 0–20)

## 2022-04-12 LAB — C-REACTIVE PROTEIN: CRP: 0.5 mg/L (ref <8.0)

## 2022-04-16 ENCOUNTER — Ambulatory Visit: Payer: Medicare Other

## 2022-04-18 NOTE — Telephone Encounter (Signed)
Monitor symptoms.  Urgent care if worse.

## 2022-04-18 NOTE — Telephone Encounter (Signed)
Pt is requesting a callback in regards to Trulicity.  Pt states blood sugar was 138 on Monday and yesterday 97.  Pt states that when he had a BM on Tuesday it was blood in his stool after starting the medication on Monday. Pt states that he fills a little constipated.   Please advise

## 2022-04-19 ENCOUNTER — Encounter: Payer: Self-pay | Admitting: *Deleted

## 2022-04-19 ENCOUNTER — Other Ambulatory Visit: Payer: Self-pay | Admitting: *Deleted

## 2022-04-19 NOTE — Patient Outreach (Signed)
  Care Coordination   Initial Visit Note   04/19/2022 Name: David Strong MRN: [id number] DOB: 04-26-1956  David Strong is a 66 y.o. year old male who sees Sagardia, David Kempf, MD for primary care. I spoke with  Cal Keltner by phone today.  What matters to the patients health and wellness today?  Diabetes Educational information    Goals Addressed               This Visit's Progress     COMPLETED: Send diabetes information (pt-stated)        Care Coordination Interventions: Provided education to patient about basic DM disease process Reviewed medications with patient and discussed importance of medication adherence Counseled on importance of regular laboratory monitoring as prescribed Provided patient with written educational materials related to hypo and hyperglycemia and importance of correct treatment Reviewed scheduled/upcoming provider appointments including: pending appointments and requested pt contact his provider and scheduled his AWV for 2023 (receptive) Screening for signs and symptoms of depression related to chronic disease state  Assessed social determinant of health barriers         SDOH assessments and interventions completed:  Yes  SDOH Interventions Today    Flowsheet Row Most Recent Value  SDOH Interventions   Food Insecurity Interventions Intervention Not Indicated  Housing Interventions Intervention Not Indicated  Transportation Interventions Intervention Not Indicated        Care Coordination Interventions Activated:  Yes  Care Coordination Interventions:  Yes, provided   Declined ongoing care management follow up call for plan of care.   Follow up plan: No further intervention required.   Encounter Outcome:  Pt. Visit Completed   Elliot Cousin, RN Care Management Coordinator [facility] Main Office [phone]

## 2022-04-19 NOTE — Patient Instructions (Signed)
Visit Information  Thank you for taking time to visit with me today. Please don't hesitate to contact me if I can be of assistance to you.   Following are the goals we discussed today:   Goals Addressed               This Visit's Progress     COMPLETED: Send diabetes information (pt-stated)        Care Coordination Interventions: Provided education to patient about basic DM disease process Reviewed medications with patient and discussed importance of medication adherence Counseled on importance of regular laboratory monitoring as prescribed Provided patient with written educational materials related to hypo and hyperglycemia and importance of correct treatment Reviewed scheduled/upcoming provider appointments including: pending appointments and requested pt contact his provider and scheduled his AWV for 2023 (receptive) Screening for signs and symptoms of depression related to chronic disease state  Assessed social determinant of health barriers          Please call the care guide team at [phone] if you need to cancel or reschedule your appointment.   If you are experiencing a Mental Health or Behavioral Health Crisis or need someone to talk to, please call the Suicide and Crisis Lifeline: 988  The patient verbalized understanding of instructions, educational materials, and care plan provided today and agreed to receive a mailed copy of patient instructions, educational materials, and care plan.   No further follow up required: No further needs   Elliot Cousin, RN Care Management Coordinator [facility] Main Office [phone]

## 2022-05-16 ENCOUNTER — Ambulatory Visit: Payer: Medicare Other | Admitting: Orthopaedic Surgery

## 2022-06-11 ENCOUNTER — Ambulatory Visit: Payer: Medicare Other | Admitting: Hematology

## 2022-06-21 ENCOUNTER — Inpatient Hospital Stay: Payer: Medicare Other | Attending: Hematology | Admitting: Hematology

## 2022-06-21 ENCOUNTER — Other Ambulatory Visit: Payer: Self-pay

## 2022-06-21 VITALS — BP 118/83 | HR 77 | Temp 98.2°F | Resp 18 | Ht 64.0 in | Wt 169.9 lb

## 2022-06-21 DIAGNOSIS — Z7901 Long term (current) use of anticoagulants: Secondary | ICD-10-CM | POA: Diagnosis not present

## 2022-06-21 DIAGNOSIS — Z86718 Personal history of other venous thrombosis and embolism: Secondary | ICD-10-CM | POA: Diagnosis not present

## 2022-06-21 DIAGNOSIS — I82402 Acute embolism and thrombosis of unspecified deep veins of left lower extremity: Secondary | ICD-10-CM

## 2022-06-21 NOTE — Progress Notes (Signed)
[name]   Telephone:([phone]) [phone] Fax:([phone]) [phone]   Clinic Follow up Note   Patient Care Team: [name], MD as PCP - General (Internal Medicine)  Date of Service:  06/21/2022  CHIEF COMPLAINT: f/u DVT  CURRENT THERAPY:  Eliquis 5mg  BID since 11/22/21   ASSESSMENT & PLAN:  David Strong is a 66 y.o. male with   1. Multiple thrombosis-- RUE SVT, LLE DVT -he was hospitalized 11/22/21 with streptococcal pneumonia causing severe sepsis. Found to have blood clots during work up for arthralgias. Started on IV heparin and transitioned to Eliquis in the hospital. -repeat LLE doppler on 03/08/22 showed persistent DVT to popliteal vein.  -he has been on eliquis since discharge. He is now 6 months out from discharge.  He is back to his normal activities.  I feel comfortable with him stopping the eliquis now. I discussed that if he does develop another blood clot, he would need to be put on lifelong anticoagulation. I encouraged him to reach out with any concerns, especially with any upcoming surgeries, when he may need prophylactic anticoagulation.  -continue f/u with other doctors.    Plan -finish current fill of eliquis then stop  -Follow-up as needed.   No problem-specific Assessment & Plan notes found for this encounter.   INTERVAL HISTORY:  David Strong is here for a follow up of DVT. He was last seen by me on 03/01/22 in consultation. He presents to the clinic alone. He reports he has recovered well from hospitalization. His only complaint is left shoulder issues.   All other systems were reviewed with the patient and are negative.  MEDICAL HISTORY:  Past Medical History:  Diagnosis Date   Diabetes mellitus without complication (HCC)     SURGICAL HISTORY: Past Surgical History:  Procedure Laterality Date   APPLICATION OF WOUND VAC Left 12/07/2021   Procedure: WOUND VAC CHANGE;  Surgeon: 12/09/2021, MD;  Location: [facility] OR;  Service:  Vascular;  Laterality: Left;   INCISION AND DRAINAGE OF WOUND Left 12/04/2021   Procedure: IRRIGATION AND DEBRIDMENT OF STERNOCLAVICULAR WOUND, WOUND VAC PLACMENT.;  Surgeon: 12/06/2021, MD;  Location: [facility] OR;  Service: Vascular;  Laterality: Left;   IRRIGATION AND DEBRIDEMENT ABSCESS Left 12/07/2021   Procedure: IRRIGATION AND DEBRIDEMENT ABSCESS;  Surgeon: 12/09/2021, MD;  Location: [facility] OR;  Service: Vascular;  Laterality: Left;   TEE WITHOUT CARDIOVERSION N/A 12/08/2021   Procedure: TRANSESOPHAGEAL ECHOCARDIOGRAM (TEE);  Surgeon: 12/10/2021, MD;  Location: [facility] ENDOSCOPY;  Service: Cardiovascular;  Laterality: N/A;    I have reviewed the social history and family history with the patient and they are unchanged from previous note.  ALLERGIES:  is allergic to codeine, glipizide er [glipizide], and metformin and related.  MEDICATIONS:  Current Outpatient Medications  Medication Sig Dispense Refill   acetaminophen (TYLENOL) 325 MG tablet Take 2 tablets (650 mg total) by mouth every 6 (six) hours as needed for mild pain, fever or headache (or Fever >/= 101).     apixaban (ELIQUIS) 5 MG TABS tablet Take 1 tablet (5 mg total) by mouth 2 (two) times daily. 60 tablet 3   dapagliflozin propanediol (FARXIGA) 10 MG TABS tablet Take 1 tablet (10 mg total) by mouth daily. 90 tablet 1   Dulaglutide (TRULICITY) 0.75 MG/0.5ML SOPN Inject 0.75 mg into the skin once a week. 6 mL 3   glipiZIDE (GLUCOTROL XL) 10 MG 24 hr tablet TAKE 1 TABLET (10 MG TOTAL) BY MOUTH  2 (TWO) TIMES DAILY WITH A MEAL. 180 tablet 3   glipiZIDE (GLUCOTROL) 10 MG tablet Take 20 mg by mouth 2 (two) times daily.     glucose blood (ACCU-CHEK AVIVA PLUS) test strip Use to test blood glucose daily. 100 each 3   Insulin Pen Needle (PEN NEEDLES 3/16") 31G X 5 MM MISC 14 Units by Does not apply route 2 (two) times daily with a meal. 50 each 3   polyethylene glycol (MIRALAX / GLYCOLAX) 17 g packet Take 17 g by mouth  daily as needed. 14 each 0   rosuvastatin (CRESTOR) 10 MG tablet Take 1 tablet (10 mg total) by mouth daily. 90 tablet 3   sildenafil (VIAGRA) 100 MG tablet Take 0.5-1 tablets (50-100 mg total) by mouth daily as needed for erectile dysfunction. 5 tablet 11   No current facility-administered medications for this visit.    PHYSICAL EXAMINATION: ECOG PERFORMANCE STATUS: 0 - Asymptomatic  Vitals:   06/21/22 1442  BP: 118/83  Pulse: 77  Resp: 18  Temp: 98.2 F (36.8 C)  SpO2: 100%   Wt Readings from Last 3 Encounters:  06/21/22 169 lb 14.4 oz (77.1 kg)  04/11/22 165 lb (74.8 kg)  04/03/22 165 lb 3.2 oz (74.9 kg)     GENERAL:alert, no distress and comfortable SKIN: skin color normal, no rashes or significant lesions EYES: normal, Conjunctiva are pink and non-injected, sclera clear  NEURO: alert & oriented x 3 with fluent speech  LABORATORY DATA:  I have reviewed the data as listed    Latest Ref Rng & Units 02/22/2022   11:21 AM 12/27/2021    2:16 PM 12/15/2021    4:13 AM  CBC  WBC 3.8 - 10.8 Thousand/uL 2.5  4.5  5.0   Hemoglobin 13.2 - 17.1 g/dL 90.3  00.9  8.8   Hematocrit 38.5 - 50.0 % 44.8  31.6  27.1   Platelets 140 - 400 Thousand/uL 149  300.0  315         Latest Ref Rng & Units 02/22/2022   11:21 AM 12/27/2021    2:16 PM 12/15/2021    4:13 AM  CMP  Glucose 65 - 99 mg/dL 233  007  622   BUN 7 - 25 mg/dL 12  8  10    Creatinine 0.70 - 1.35 mg/dL  6.33  3.54   Sodium 135 - 146 mmol/L 138  134  134   Potassium 3.5 - 5.3 mmol/L 4.3  4.2  4.0   Chloride 98 - 110 mmol/L 100  96  99   CO2 20 - 32 mmol/L 28  29  29    Calcium 8.6 - 10.3 mg/dL 9.7  8.9  8.4   Total Protein 6.1 - 8.1 g/dL 6.9  7.4    Total Bilirubin 0.2 - 1.2 mg/dL 0.6  0.4    Alkaline Phos 39 - 117 U/L  156    AST 10 - 35 U/L 13  14    ALT 9 - 46 U/L 12  19        RADIOGRAPHIC STUDIES: I have personally reviewed the radiological images as listed and agreed with the findings in the report. No  results found.    No orders of the defined types were placed in this encounter.  All questions were answered. The patient knows to call the clinic with any problems, questions or concerns. No barriers to learning was detected. The total time spent in the appointment was 20 minutes.  Truitt Merle, MD 06/21/2022   I, Wilburn Mylar, am acting as scribe for Truitt Merle, MD.   I have reviewed the above documentation for accuracy and completeness, and I agree with the above.

## 2022-06-27 NOTE — Progress Notes (Unsigned)
Subjective:   David Strong is a 66 y.o. male who presents for an Initial Medicare Annual Wellness Visit. I connected with  Kermitt Beckel on 06/28/22 by a audio enabled telemedicine application and verified that I am speaking with the correct person using two identifiers.  Patient Location: Home  Provider Location: Home Office  I discussed the limitations of evaluation and management by telemedicine. The patient expressed understanding and agreed to proceed.  Review of Systems    Deferred to PCP Cardiac Risk Factors include: advanced age (>56men, >21 women);diabetes mellitus;dyslipidemia;male gender     Objective:    There were no vitals filed for this visit. There is no height or weight on file to calculate BMI.     06/28/2022   10:19 AM 11/22/2021    8:30 PM 11/22/2021    7:37 PM 11/22/2021    3:56 PM  Advanced Directives  Does Patient Have a Medical Advance Directive? No No No No  Would patient like information on creating a medical advance directive? No - Patient declined No - Patient declined No - Patient declined     Current Medications (verified) Outpatient Encounter Medications as of 06/28/2022  Medication Sig   acetaminophen (TYLENOL) 325 MG tablet Take 2 tablets (650 mg total) by mouth every 6 (six) hours as needed for mild pain, fever or headache (or Fever >/= 101).   dapagliflozin propanediol (FARXIGA) 10 MG TABS tablet Take 1 tablet (10 mg total) by mouth daily.   Dulaglutide (TRULICITY) 0.75 MG/0.5ML SOPN Inject 0.75 mg into the skin once a week.   glucose blood (ACCU-CHEK AVIVA PLUS) test strip Use to test blood glucose daily.   Insulin Pen Needle (PEN NEEDLES 3/16") 31G X 5 MM MISC 14 Units by Does not apply route 2 (two) times daily with a meal.   rosuvastatin (CRESTOR) 10 MG tablet Take 1 tablet (10 mg total) by mouth daily.   sildenafil (VIAGRA) 100 MG tablet Take 0.5-1 tablets (50-100 mg total) by mouth daily as needed for erectile dysfunction.   apixaban  (ELIQUIS) 5 MG TABS tablet Take 1 tablet (5 mg total) by mouth 2 (two) times daily. (Patient not taking: Reported on 06/28/2022)   glipiZIDE (GLUCOTROL XL) 10 MG 24 hr tablet TAKE 1 TABLET (10 MG TOTAL) BY MOUTH 2 (TWO) TIMES DAILY WITH A MEAL.   glipiZIDE (GLUCOTROL) 10 MG tablet Take 20 mg by mouth 2 (two) times daily. (Patient not taking: Reported on 06/28/2022)   polyethylene glycol (MIRALAX / GLYCOLAX) 17 g packet Take 17 g by mouth daily as needed. (Patient not taking: Reported on 06/28/2022)   No facility-administered encounter medications on file as of 06/28/2022.    Allergies (verified) Codeine, Glipizide er [glipizide], and Metformin and related   History: Past Medical History:  Diagnosis Date   Diabetes mellitus without complication [facility])    Past Surgical History:  Procedure Laterality Date   APPLICATION OF WOUND VAC Left 12/07/2021   Procedure: WOUND VAC CHANGE;  Surgeon: [name], MD;  Location: [facility] OR;  Service: Vascular;  Laterality: Left;   INCISION AND DRAINAGE OF WOUND Left 12/04/2021   Procedure: IRRIGATION AND DEBRIDMENT OF STERNOCLAVICULAR WOUND, WOUND VAC PLACMENT.;  Surgeon: [name], MD;  Location: [facility] OR;  Service: Vascular;  Laterality: Left;   IRRIGATION AND DEBRIDEMENT ABSCESS Left 12/07/2021   Procedure: IRRIGATION AND DEBRIDEMENT ABSCESS;  Surgeon: [name], MD;  Location: [facility] OR;  Service: Vascular;  Laterality: Left;   TEE WITHOUT CARDIOVERSION N/A  12/08/2021   Procedure: TRANSESOPHAGEAL ECHOCARDIOGRAM (TEE);  Surgeon: Christell Constanthandrasekhar, Mahesh A, MD;  [facility] END[facility];  Service: Cardiovascular;  Laterality: N/A;   Family History  Adopted: Yes   Social History   Socioeconomic History   Marital status: Married    Spouse name: Arna Mediciora   Number of children: 3   Years of education: college   Highest education level: Not on file  Occupational History   Not on file  Tobacco Use   Smoking status: Never   Smokeless tobacco:  Never  Vaping Use   Vaping Use: Never used  Substance and Sexual Activity   Alcohol use: Not Currently    Comment: occasional   Drug use: No   Sexual activity: Yes  Other Topics Concern   Not on file  Social History Narrative   Not on file   Social Determinants of Health   Financial Resource Strain: Low Risk  (06/28/2022)   Overall Financial Resource Strain (CARDIA)    Difficulty of Paying Living Expenses: Not hard at all  Food Insecurity: No Food Insecurity (06/28/2022)   Hunger Vital Sign    Worried About Running Out of Food in the Last Year: Never true    Ran Out of Food in the Last Year: Never true  Transportation Needs: No Transportation Needs (06/28/2022)   PRAPARE - Administrator, Civil ServiceTransportation    Lack of Transportation (Medical): No    Lack of Transportation (Non-Medical): No  Physical Activity: Insufficiently Active (06/28/2022)   Exercise Vital Sign    Days of Exercise per Week: 2 days    Minutes of Exercise per Session: 60 min  Stress: No Stress Concern Present (06/28/2022)   [employer] - Occupational Stress Questionnaire    Feeling of Stress : Not at all  Social Connections: Socially Integrated (06/28/2022)   Social Connection and Isolation Panel [NHANES]    Frequency of Communication with Friends and Family: More than three times a week    Frequency of Social Gatherings with Friends and Family: More than three times a week    Attends Religious Services: 1 to 4 times per year    Active Member of [employer] or Organizations: Yes    Attends Engineer, structuralClub or Organization Meetings: More than 4 times per year    Marital Status: Married    Tobacco Counseling Counseling given: Not Answered   Clinical Intake:  Pre-visit preparation completed: Yes  Pain : No/denies pain     Nutritional Status: BMI 25 -29 Overweight Nutritional Risks: None Diabetes: Yes CBG done?: No (phone visit) Did pt. bring in CBG monitor from home?: No (phone visit)  How often do you  need to have someone help you when you read instructions, pamphlets, or other written materials from your doctor or pharmacy?: 1 - Never What is the last grade level you completed in school?: associate degree  Diabetic?Yes Nutrition Risk Assessment:  Has the patient had any N/V/D within the last 2 months?  No  Does the patient have any non-healing wounds?  No  Has the patient had any unintentional weight loss or weight gain?  No   Diabetes:  Is the patient diabetic?  Yes  If diabetic, was a CBG obtained today?  No , phone visit Did the patient bring in their glucometer from home?  No , phone visit How often do you monitor your CBG's? daily.   Financial Strains and Diabetes Management:  Are you having any financial strains with the device, your supplies or your medication? No .  Does the patient want to be seen by Chronic Care Management for management of their diabetes?  No  Would the patient like to be referred to a Nutritionist or for Diabetic Management?  No   Diabetic Exams:  Diabetic Eye Exam: Completed Reports he goes to [facility] in 9/23 Diabetic Foot Exam: Overdue, Pt has been advised about the importance in completing this exam. Pt is scheduled for diabetic foot exam on deferred to PCP.   Interpreter Needed?: No  Information entered by :: Blanchie Serve RN   Activities of Daily Living    06/28/2022   10:18 AM 11/22/2021    7:40 PM  In your present state of health, do you have any difficulty performing the following activities:  Hearing? 0   Difficulty concentrating or making decisions? 0   Walking or climbing stairs? 0   Dressing or bathing? 0   Doing errands, shopping? 0 0  Preparing Food and eating ? N   Using the Toilet? N   In the past six months, have you accidently leaked urine? N   Do you have problems with loss of bowel control? N   Managing your Medications? N   Managing your Finances? N   Housekeeping or managing your Housekeeping? N     Patient  Care Team: [name], MD as PCP - General (Internal Medicine)  Indicate any recent Medical Services you may have received from other than [facility] providers in the past year (date may be approximate).     Assessment:   This is a routine wellness examination for Wilhelm.  Hearing/Vision screen No results found.  Dietary issues and exercise activities discussed: Current Exercise Habits: Home exercise routine, Type of exercise: walking, Time (Minutes): 60, Frequency (Times/Week): 2, Weekly Exercise (Minutes/Week): 120, Intensity: Mild, Exercise limited by: None identified   Goals Addressed             This Visit's Progress    Patient Stated       Continue to improve my diabetes and stay active.      Depression Screen    06/28/2022   10:21 AM 04/19/2022   10:35 AM 04/11/2022    1:48 PM 03/29/2022    1:13 PM 02/22/2022   10:30 AM 12/29/2021    3:55 PM 12/28/2021    3:15 PM  PHQ 2/9 Scores  PHQ - 2 Score 0 0 0 0 0 1 0    Fall Risk    06/28/2022   10:21 AM 04/19/2022   10:41 AM 04/11/2022    1:48 PM 03/29/2022    1:13 PM 02/22/2022   10:30 AM  Fall Risk   Falls in the past year? 0 0 0 0 0  Number falls in past yr: 0    0  Injury with Fall? 0    0  Risk for fall due to : No Fall Risks No Fall Risks No Fall Risks    Follow up Falls evaluation completed Falls evaluation completed Falls evaluation completed      FALL RISK PREVENTION PERTAINING TO THE HOME:  Any stairs in or around the home? Yes  If so, are there any without handrails? Yes  Home free of loose throw rugs in walkways, pet beds, electrical cords, etc? Yes  Adequate lighting in your home to reduce risk of falls? Yes   ASSISTIVE DEVICES UTILIZED TO PREVENT FALLS:  Life alert? No  Use of a cane, walker or w/c? No  Grab bars in the bathroom? No  Shower chair or bench in shower? No  Elevated toilet seat or a handicapped toilet? No   Cognitive Function:        06/28/2022   10:23 AM  6CIT Screen  What  Year? 0 points  What month? 0 points  What time? 0 points  Count back from 20 0 points  Months in reverse 0 points  Repeat phrase 4 points  Total Score 4 points    Immunizations Immunization History  Administered Date(s) Administered   Hepatitis A 07/25/2006   Td 11/28/2005    TDAP status: Up to date  Flu Vaccine status: Due, Education has been provided regarding the importance of this vaccine. Advised may receive this vaccine at local pharmacy or Health Dept. Aware to provide a copy of the vaccination record if obtained from local pharmacy or Health Dept. Verbalized acceptance and understanding.  Pneumococcal vaccine status: Due, Education has been provided regarding the importance of this vaccine. Advised may receive this vaccine at local pharmacy or Health Dept. Aware to provide a copy of the vaccination record if obtained from local pharmacy or Health Dept. Verbalized acceptance and understanding.  Covid-19 vaccine status: Information provided on how to obtain vaccines.   Qualifies for Shingles Vaccine? Yes   Zostavax completed No   Shingrix Completed?: No.    Education has been provided regarding the importance of this vaccine. Patient has been advised to call insurance company to determine out of pocket expense if they have not yet received this vaccine. Advised may also receive vaccine at local pharmacy or Health Dept. Verbalized acceptance and understanding.  Screening Tests Health Maintenance  Topic Date Due   COVID-19 Vaccine (1) Never done   FOOT EXAM  01/29/2020   OPHTHALMOLOGY EXAM  02/03/2020   Pneumonia Vaccine 40+ Years old (1 - PCV) Never done   Zoster Vaccines- Shingrix (1 of 2) 09/29/2022 (Originally 06/25/2006)   HEMOGLOBIN A1C  09/29/2022   Diabetic kidney evaluation - Urine ACR  10/12/2022   Diabetic kidney evaluation - GFR measurement  02/23/2023   Medicare Annual Wellness (AWV)  06/29/2023   Fecal DNA (Cologuard)  05/25/2024   TETANUS/TDAP  01/18/2025    Hepatitis C Screening  Completed   HPV VACCINES  Aged Out   INFLUENZA VACCINE  Discontinued    Health Maintenance  Health Maintenance Due  Topic Date Due   COVID-19 Vaccine (1) Never done   FOOT EXAM  01/29/2020   OPHTHALMOLOGY EXAM  02/03/2020   Pneumonia Vaccine 71+ Years old (1 - PCV) Never done    Colorectal cancer screening: Type of screening: Cologuard. Completed 05/25/21. Repeat every 3 years  Lung Cancer Screening: (Low Dose CT Chest recommended if Age 37-80 years, 30 pack-year currently smoking OR have quit w/in 15years.) does not qualify.   Additional Screening:  Hepatitis C Screening: does qualify; Completed 11/30/21  Vision Screening: Recommended annual ophthalmology exams for early detection of glaucoma and other disorders of the eye. Is the patient up to date with their annual eye exam?  Yes  Who is the provider or what is the name of the office in which the patient attends annual eye exams? [facility]  If pt is not established with a provider, would they like to be referred to a provider to establish care?  N/A .   Dental Screening: Recommended annual dental exams for proper oral hygiene  Community Resource Referral / Chronic Care Management: CRR required this visit?  No   CCM required this visit?  No  Plan:     I have personally reviewed and noted the following in the patient's chart:   Medical and social history Use of alcohol, tobacco or illicit drugs  Current medications and supplements including opioid prescriptions. Patient is not currently taking opioid prescriptions. Functional ability and status Nutritional status Physical activity Advanced directives List of other physicians Hospitalizations, surgeries, and ER visits in previous 12 months Vitals Screenings to include cognitive, depression, and falls Referrals and appointments  In addition, I have reviewed and discussed with patient certain preventive protocols, quality metrics, and best  practice recommendations. A written personalized care plan for preventive services as well as general preventive health recommendations were provided to patient.     Wanda Plump, RN   06/28/2022   Nurse Notes:  Mr. Goley , Thank you for taking time to come for your Medicare Wellness Visit. I appreciate your ongoing commitment to your health goals. Please review the following plan we discussed and let me know if I can assist you in the future.   These are the goals we discussed:  Goals      Patient Stated     Continue to improve my diabetes and stay active.        This is a list of the screening recommended for you and due dates:  Health Maintenance  Topic Date Due   COVID-19 Vaccine (1) Never done   Complete foot exam   01/29/2020   Eye exam for diabetics  02/03/2020   Pneumonia Vaccine (1 - PCV) Never done   Zoster (Shingles) Vaccine (1 of 2) 09/29/2022*   Hemoglobin A1C  09/29/2022   Yearly kidney health urinalysis for diabetes  10/12/2022   Yearly kidney function blood test for diabetes  02/23/2023   Medicare Annual Wellness Visit  06/29/2023   Cologuard (Stool DNA test)  05/25/2024   Tetanus Vaccine  01/18/2025   Hepatitis C Screening: USPSTF Recommendation to screen - Ages 18-79 yo.  Completed   HPV Vaccine  Aged Out   Flu Shot  Discontinued  *Topic was postponed. The date shown is not the original due date.

## 2022-06-27 NOTE — Patient Instructions (Signed)
Health Maintenance, Male Adopting a healthy lifestyle and getting preventive care are important in promoting health and wellness. Ask your health care provider about: The right schedule for you to have regular tests and exams. Things you can do on your own to prevent diseases and keep yourself healthy. What should I know about diet, weight, and exercise? Eat a healthy diet  Eat a diet that includes plenty of vegetables, fruits, low-fat dairy products, and lean protein. Do not eat a lot of foods that are high in solid fats, added sugars, or sodium. Maintain a healthy weight Body mass index (BMI) is a measurement that can be used to identify possible weight problems. It estimates body fat based on height and weight. Your health care provider can help determine your BMI and help you achieve or maintain a healthy weight. Get regular exercise Get regular exercise. This is one of the most important things you can do for your health. Most adults should: Exercise for at least 150 minutes each week. The exercise should increase your heart rate and make you sweat (moderate-intensity exercise). Do strengthening exercises at least twice a week. This is in addition to the moderate-intensity exercise. Spend less time sitting. Even light physical activity can be beneficial. Watch cholesterol and blood lipids Have your blood tested for lipids and cholesterol at 66 years of age, then have this test every 5 years. You may need to have your cholesterol levels checked more often if: Your lipid or cholesterol levels are high. You are older than 66 years of age. You are at high risk for heart disease. What should I know about cancer screening? Many types of cancers can be detected early and may often be prevented. Depending on your health history and family history, you may need to have cancer screening at various ages. This may include screening for: Colorectal cancer. Prostate cancer. Skin cancer. Lung  cancer. What should I know about heart disease, diabetes, and high blood pressure? Blood pressure and heart disease High blood pressure causes heart disease and increases the risk of stroke. This is more likely to develop in people who have high blood pressure readings or are overweight. Talk with your health care provider about your target blood pressure readings. Have your blood pressure checked: Every 3-5 years if you are 18-39 years of age. Every year if you are 40 years old or older. If you are between the ages of 65 and 75 and are a current or former smoker, ask your health care provider if you should have a one-time screening for abdominal aortic aneurysm (AAA). Diabetes Have regular diabetes screenings. This checks your fasting blood sugar level. Have the screening done: Once every three years after age 45 if you are at a normal weight and have a low risk for diabetes. More often and at a younger age if you are overweight or have a high risk for diabetes. What should I know about preventing infection? Hepatitis B If you have a higher risk for hepatitis B, you should be screened for this virus. Talk with your health care provider to find out if you are at risk for hepatitis B infection. Hepatitis C Blood testing is recommended for: Everyone born from 1945 through 1965. Anyone with known risk factors for hepatitis C. Sexually transmitted infections (STIs) You should be screened each year for STIs, including gonorrhea and chlamydia, if: You are sexually active and are younger than 66 years of age. You are older than 66 years of age and your   health care provider tells you that you are at risk for this type of infection. Your sexual activity has changed since you were last screened, and you are at increased risk for chlamydia or gonorrhea. Ask your health care provider if you are at risk. Ask your health care provider about whether you are at high risk for HIV. Your health care provider  may recommend a prescription medicine to help prevent HIV infection. If you choose to take medicine to prevent HIV, you should first get tested for HIV. You should then be tested every 3 months for as long as you are taking the medicine. Follow these instructions at home: Alcohol use Do not drink alcohol if your health care provider tells you not to drink. If you drink alcohol: Limit how much you have to 0-2 drinks a day. Know how much alcohol is in your drink. In the [place] one drink equals one 12 oz bottle of beer (355 mL), one 5 oz glass of Turhan Chill (148 mL), or one 1 oz glass of hard liquor (44 mL). Lifestyle Do not use any products that contain nicotine or tobacco. These products include cigarettes, chewing tobacco, and vaping devices, such as e-cigarettes. If you need help quitting, ask your health care provider. Do not use street drugs. Do not share needles. Ask your health care provider for help if you need support or information about quitting drugs. General instructions Schedule regular health, dental, and eye exams. Stay current with your vaccines. Tell your health care provider if: You often feel depressed. You have ever been abused or do not feel safe at home. Summary Adopting a healthy lifestyle and getting preventive care are important in promoting health and wellness. Follow your health care provider's instructions about healthy diet, exercising, and getting tested or screened for diseases. Follow your health care provider's instructions on monitoring your cholesterol and blood pressure. This information is not intended to replace advice given to you by your health care provider. Make sure you discuss any questions you have with your health care provider. Document Revised: 12/26/2020 Document Reviewed: 12/26/2020 Elsevier Patient Education  2023 Elsevier Inc.  

## 2022-06-28 ENCOUNTER — Ambulatory Visit (INDEPENDENT_AMBULATORY_CARE_PROVIDER_SITE_OTHER): Payer: Medicare Other | Admitting: *Deleted

## 2022-06-28 DIAGNOSIS — Z0000 Encounter for general adult medical examination without abnormal findings: Secondary | ICD-10-CM | POA: Diagnosis not present

## 2022-07-02 ENCOUNTER — Encounter: Payer: Self-pay | Admitting: Emergency Medicine

## 2022-07-02 ENCOUNTER — Encounter: Payer: Medicare Other | Admitting: Emergency Medicine

## 2022-07-04 ENCOUNTER — Encounter: Payer: Self-pay | Admitting: Emergency Medicine

## 2022-07-04 ENCOUNTER — Ambulatory Visit (INDEPENDENT_AMBULATORY_CARE_PROVIDER_SITE_OTHER): Payer: Medicare Other | Admitting: Emergency Medicine

## 2022-07-04 VITALS — BP 128/82 | HR 76 | Temp 98.1°F | Ht 64.0 in | Wt 170.0 lb

## 2022-07-04 DIAGNOSIS — E1165 Type 2 diabetes mellitus with hyperglycemia: Secondary | ICD-10-CM | POA: Diagnosis not present

## 2022-07-04 MED ORDER — GLIPIZIDE ER 10 MG PO TB24: 10.0000 mg | ORAL_TABLET | Freq: Two times a day (BID) | ORAL | 3 refills | Status: DC

## 2022-07-04 MED ORDER — TRULICITY 0.75 MG/0.5ML ~~LOC~~ SOAJ: 0.7500 mg | SUBCUTANEOUS | 3 refills | Status: DC

## 2022-07-04 NOTE — Patient Instructions (Signed)

## 2022-07-04 NOTE — Assessment & Plan Note (Signed)
Much improved diabetes with hemoglobin A1c of 6.7. Continue weekly Trulicity 0.75 mg and daily Farxiga 10 mg. For now we will continue glipizide 10 mg twice a day.  We will follow-up in 3 months and probably start decreasing glipizide dose. Cardiovascular risks associated with diabetes discussed with patient. Diet and nutrition discussed. Follow-up in 3 months.

## 2022-07-04 NOTE — Progress Notes (Signed)
David Strong 66 y.o.   Chief Complaint  Patient presents with   Follow-up    f/u appt, no concerns     HISTORY OF PRESENT ILLNESS: This is a 66 y.o. male A1A here for 72-month follow-up of diabetes. Doing well.  Has no complaints or medical concerns today.  HPI   Prior to Admission medications   Medication Sig Start Date End Date Taking? Authorizing Provider  acetaminophen (TYLENOL) 325 MG tablet Take 2 tablets (650 mg total) by mouth every 6 (six) hours as needed for mild pain, fever or headache (or Fever >/= 101). 12/02/21  Yes Meredeth Ide, MD  dapagliflozin propanediol (FARXIGA) 10 MG TABS tablet Take 1 tablet (10 mg total) by mouth daily. 01/09/22  Yes Keno Caraway, Eilleen Kempf, MD  Dulaglutide (TRULICITY) 0.75 MG/0.5ML SOPN Inject 0.75 mg into the skin once a week. 04/09/22  Yes Dorrine Montone, Eilleen Kempf, MD  glucose blood (ACCU-CHEK AVIVA PLUS) test strip Use to test blood glucose daily. 07/20/21  Yes Zoriah Pulice, Eilleen Kempf, MD  Insulin Pen Needle (PEN NEEDLES 3/16") 31G X 5 MM MISC 14 Units by Does not apply route 2 (two) times daily with a meal. 12/02/21  Yes Sharl Ma, Sarina Ill, MD  rosuvastatin (CRESTOR) 10 MG tablet Take 1 tablet (10 mg total) by mouth daily. 03/29/22  Yes Lajuan Kovaleski, Eilleen Kempf, MD  sildenafil (VIAGRA) 100 MG tablet Take 0.5-1 tablets (50-100 mg total) by mouth daily as needed for erectile dysfunction. 10/12/21  Yes Ryszard Socarras, Eilleen Kempf, MD  apixaban (ELIQUIS) 5 MG TABS tablet Take 1 tablet (5 mg total) by mouth 2 (two) times daily. Patient not taking: Reported on 06/28/2022 03/08/22 06/06/22  Malachy Mood, MD  glipiZIDE (GLUCOTROL XL) 10 MG 24 hr tablet TAKE 1 TABLET (10 MG TOTAL) BY MOUTH 2 (TWO) TIMES DAILY WITH A MEAL. 03/12/22 06/10/22  [name], MD  polyethylene glycol (MIRALAX / GLYCOLAX) 17 g packet Take 17 g by mouth daily as needed. Patient not taking: Reported on 06/28/2022 12/02/21   Meredeth Ide, MD    Allergies  Allergen Reactions   Codeine Itching    Glipizide Er [Glipizide]     SOB, malaise. He tolerates XL without issues.   Metformin And Related Rash    Patient Active Problem List   Diagnosis Date Noted   Chronic pain of left knee 03/29/2022   Obese 12/05/2021   Bilateral leg edema 11/28/2021   DVT (deep venous thrombosis) (HCC) 11/27/2021   Right inguinal hernia 10/12/2021   Type 2 diabetes mellitus (HCC) 06/07/2015   Leucopenia 08/03/2012   Thrombocytopenia (HCC) 08/03/2012    Past Medical History:  Diagnosis Date   Diabetes mellitus without complication [facility])     Past Surgical History:  Procedure Laterality Date   APPLICATION OF WOUND VAC Left 12/07/2021   Procedure: WOUND VAC CHANGE;  Surgeon: [name], MD;  Location: [facility] OR;  Service: Vascular;  Laterality: Left;   INCISION AND DRAINAGE OF WOUND Left 12/04/2021   Procedure: IRRIGATION AND DEBRIDMENT OF STERNOCLAVICULAR WOUND, WOUND VAC PLACMENT.;  Surgeon: [name], MD;  Location: [facility] OR;  Service: Vascular;  Laterality: Left;   IRRIGATION AND DEBRIDEMENT ABSCESS Left 12/07/2021   Procedure: IRRIGATION AND DEBRIDEMENT ABSCESS;  Surgeon: [name], MD;  Location: [facility] OR;  Service: Vascular;  Laterality: Left;   TEE WITHOUT CARDIOVERSION N/A 12/08/2021   Procedure: TRANSESOPHAGEAL ECHOCARDIOGRAM (TEE);  Surgeon: Christell Constant, MD;  Location: [facility] ENDOSCOPY;  Service: Cardiovascular;  Laterality: N/A;  Social History   Socioeconomic History   Marital status: Married    Spouse name: David Strong   Number of children: 3   Years of education: college   Highest education level: Not on file  Occupational History   Not on file  Tobacco Use   Smoking status: Never   Smokeless tobacco: Never  Vaping Use   Vaping Use: Never used  Substance and Sexual Activity   Alcohol use: Not Currently    Comment: occasional   Drug use: No   Sexual activity: Yes  Other Topics Concern   Not on file  Social History Narrative   Not on file    Social Determinants of Health   Financial Resource Strain: Low Risk  (06/28/2022)   Overall Financial Resource Strain (CARDIA)    Difficulty of Paying Living Expenses: Not hard at all  Food Insecurity: No Food Insecurity (06/28/2022)   Hunger Vital Sign    Worried About Running Out of Food in the Last Year: Never true    Ran Out of Food in the Last Year: Never true  Transportation Needs: No Transportation Needs (06/28/2022)   PRAPARE - Administrator, Civil Service (Medical): No    Lack of Transportation (Non-Medical): No  Physical Activity: Insufficiently Active (06/28/2022)   Exercise Vital Sign    Days of Exercise per Week: 2 days    Minutes of Exercise per Session: 60 min  Stress: No Stress Concern Present (06/28/2022)   [employer] of Occupational Health - Occupational Stress Questionnaire    Feeling of Stress : Not at all  Social Connections: Socially Integrated (06/28/2022)   Social Connection and Isolation Panel [NHANES]    Frequency of Communication with Friends and Family: More than three times a week    Frequency of Social Gatherings with Friends and Family: More than three times a week    Attends Religious Services: 1 to 4 times per year    Active Member of [employer] or Organizations: Yes    Attends Engineer, structural: More than 4 times per year    Marital Status: Married  Catering manager Violence: Not At Risk (06/28/2022)   Humiliation, Afraid, Rape, and Kick questionnaire    Fear of Current or Ex-Partner: No    Emotionally Abused: No    Physically Abused: No    Sexually Abused: No    Family History  Adopted: Yes     Review of Systems  Constitutional: Negative.  Negative for fever.  HENT: Negative.  Negative for congestion and sore throat.   Respiratory: Negative.  Negative for cough and shortness of breath.   Cardiovascular: Negative.  Negative for chest pain and palpitations.  Gastrointestinal:  Negative for abdominal pain, nausea and  vomiting.  Skin: Negative.  Negative for rash.  Neurological: Negative.  Negative for dizziness and headaches.  All other systems reviewed and are negative.  Today's Vitals   07/04/22 1535  Weight: 170 lb (77.1 kg)  Height: 5\' 4"  (1.626 m)   Body mass index is 29.18 kg/m. Wt Readings from Last 3 Encounters:  07/04/22 170 lb (77.1 kg)  06/21/22 169 lb 14.4 oz (77.1 kg)  04/11/22 165 lb (74.8 kg)     Physical Exam Vitals reviewed.  Constitutional:      Appearance: Normal appearance.  HENT:     Head: Normocephalic.  Eyes:     Extraocular Movements: Extraocular movements intact.     Pupils: Pupils are equal, round, and reactive to light.  Cardiovascular:  Rate and Rhythm: Normal rate and regular rhythm.     Pulses: Normal pulses.     Heart sounds: Normal heart sounds.  Pulmonary:     Effort: Pulmonary effort is normal.     Breath sounds: Normal breath sounds.  Abdominal:     Palpations: Abdomen is soft.     Tenderness: There is no abdominal tenderness.  Skin:    General: Skin is warm and dry.  Neurological:     Mental Status: He is alert and oriented to person, place, and time.  Psychiatric:        Mood and Affect: Mood normal.        Behavior: Behavior normal.      ASSESSMENT & PLAN: A total of 46 minutes was spent with the patient and counseling/coordination of care regarding preparing for this visit, review of most recent office visit notes, review of most recent blood work results including today's interpretation of hemoglobin A1c, review of multiple chronic medical conditions and their management, review of all medications, education on nutrition, cardiovascular risks associated with diabetes, prognosis, documentation, need for follow-up.  Problem List Items Addressed This Visit       Endocrine   Type 2 diabetes mellitus (HCC) - Primary    Much improved diabetes with hemoglobin A1c of 6.7. Continue weekly Trulicity 0.75 mg and daily Farxiga 10 mg. For  now we will continue glipizide 10 mg twice a day.  We will follow-up in 3 months and probably start decreasing glipizide dose. Cardiovascular risks associated with diabetes discussed with patient. Diet and nutrition discussed. Follow-up in 3 months.      Relevant Medications   glipiZIDE (GLUCOTROL XL) 10 MG 24 hr tablet   Dulaglutide (TRULICITY) 0.75 MG/0.5ML SOPN   Other Relevant Orders   POCT HgB A1C   Patient Instructions  Diabetes Mellitus and Nutrition, Adult When you have diabetes, or diabetes mellitus, it is very important to have healthy eating habits because your blood sugar (glucose) levels are greatly affected by what you eat and drink. Eating healthy foods in the right amounts, at about the same times every day, can help you: Manage your blood glucose. Lower your risk of heart disease. Improve your blood pressure. Reach or maintain a healthy weight. What can affect my meal plan? Every person with diabetes is different, and each person has different needs for a meal plan. Your health care provider may recommend that you work with a dietitian to make a meal plan that is best for you. Your meal plan may vary depending on factors such as: The calories you need. The medicines you take. Your weight. Your blood glucose, blood pressure, and cholesterol levels. Your activity level. Other health conditions you have, such as heart or kidney disease. How do carbohydrates affect me? Carbohydrates, also called carbs, affect your blood glucose level more than any other type of food. Eating carbs raises the amount of glucose in your blood. It is important to know how many carbs you can safely have in each meal. This is different for every person. Your dietitian can help you calculate how many carbs you should have at each meal and for each snack. How does alcohol affect me? Alcohol can cause a decrease in blood glucose (hypoglycemia), especially if you use insulin or take certain diabetes  medicines by mouth. Hypoglycemia can be a life-threatening condition. Symptoms of hypoglycemia, such as sleepiness, dizziness, and confusion, are similar to symptoms of having too much alcohol. Do not drink alcohol if:  Your health care provider tells you not to drink. You are pregnant, may be pregnant, or are planning to become pregnant. If you drink alcohol: Limit how much you have to: 0-1 drink a day for women. 0-2 drinks a day for men. Know how much alcohol is in your drink. In the [place] one drink equals one 12 oz bottle of beer (355 mL), one 5 oz glass of wine (148 mL), or one 1 oz glass of hard liquor (44 mL). Keep yourself hydrated with water, diet soda, or unsweetened iced tea. Keep in mind that regular soda, juice, and other mixers may contain a lot of sugar and must be counted as carbs. What are tips for following this plan?  Reading food labels Start by checking the serving size on the Nutrition Facts label of packaged foods and drinks. The number of calories and the amount of carbs, fats, and other nutrients listed on the label are based on one serving of the item. Many items contain more than one serving per package. Check the total grams (g) of carbs in one serving. Check the number of grams of saturated fats and trans fats in one serving. Choose foods that have a low amount or none of these fats. Check the number of milligrams (mg) of salt (sodium) in one serving. Most people should limit total sodium intake to less than 2,300 mg per day. Always check the nutrition information of foods labeled as "low-fat" or "nonfat." These foods may be higher in added sugar or refined carbs and should be avoided. Talk to your dietitian to identify your daily goals for nutrients listed on the label. Shopping Avoid buying canned, pre-made, or processed foods. These foods tend to be high in fat, sodium, and added sugar. Shop around the outside edge of the grocery store. This is where you will most  often find fresh fruits and vegetables, bulk grains, fresh meats, and fresh dairy products. Cooking Use low-heat cooking methods, such as baking, instead of high-heat cooking methods, such as deep frying. Cook using healthy oils, such as olive, canola, or sunflower oil. Avoid cooking with butter, cream, or high-fat meats. Meal planning Eat meals and snacks regularly, preferably at the same times every day. Avoid going long periods of time without eating. Eat foods that are high in fiber, such as fresh fruits, vegetables, beans, and whole grains. Eat 4-6 oz (112-168 g) of lean protein each day, such as lean meat, chicken, fish, eggs, or tofu. One ounce (oz) (28 g) of lean protein is equal to: 1 oz (28 g) of meat, chicken, or fish. 1 egg.  cup (62 g) of tofu. Eat some foods each day that contain healthy fats, such as avocado, nuts, seeds, and fish. What foods should I eat? Fruits Berries. Apples. Oranges. Peaches. Apricots. Plums. Grapes. Mangoes. Papayas. Pomegranates. Kiwi. Cherries. Vegetables Leafy greens, including lettuce, spinach, kale, chard, collard greens, mustard greens, and cabbage. Beets. Cauliflower. Broccoli. Carrots. Green beans. Tomatoes. Peppers. Onions. Cucumbers. Brussels sprouts. Grains Whole grains, such as whole-wheat or whole-grain bread, crackers, tortillas, cereal, and pasta. Unsweetened oatmeal. Quinoa. Brown or wild rice. Meats and other proteins Seafood. Poultry without skin. Lean cuts of poultry and beef. Tofu. Nuts. Seeds. Dairy Low-fat or fat-free dairy products such as milk, yogurt, and cheese. The items listed above may not be a complete list of foods and beverages you can eat and drink. Contact a dietitian for more information. What foods should I avoid? Fruits Fruits canned with syrup. Vegetables Canned vegetables.  Frozen vegetables with butter or cream sauce. Grains Refined white flour and flour products such as bread, pasta, snack foods, and  cereals. Avoid all processed foods. Meats and other proteins Fatty cuts of meat. Poultry with skin. Breaded or fried meats. Processed meat. Avoid saturated fats. Dairy Full-fat yogurt, cheese, or milk. Beverages Sweetened drinks, such as soda or iced tea. The items listed above may not be a complete list of foods and beverages you should avoid. Contact a dietitian for more information. Questions to ask a health care provider Do I need to meet with a certified diabetes care and education specialist? Do I need to meet with a dietitian? What number can I call if I have questions? When are the best times to check my blood glucose? Where to find more information: American Diabetes Association: diabetes.org Academy of Nutrition and Dietetics: eatright.[employer] Corporationorg National Institute of Diabetes and Digestive and Kidney Diseases: StageSync.siniddk.nih.gov Association of Diabetes Care & Education Specialists: diabeteseducator.org Summary It is important to have healthy eating habits because your blood sugar (glucose) levels are greatly affected by what you eat and drink. It is important to use alcohol carefully. A healthy meal plan will help you manage your blood glucose and lower your risk of heart disease. Your health care provider may recommend that you work with a dietitian to make a meal plan that is best for you. This information is not intended to replace advice given to you by your health care provider. Make sure you discuss any questions you have with your health care provider. Document Revised: 03/09/2020 Document Reviewed: 03/09/2020 Elsevier Patient Education  2023 Elsevier Inc.    Edwina BarthMiguel Katlynn Naser, MD [facility]

## 2022-07-05 ENCOUNTER — Telehealth: Payer: Self-pay | Admitting: Emergency Medicine

## 2022-07-05 LAB — POCT GLYCOSYLATED HEMOGLOBIN (HGB A1C): Hemoglobin A1C: 6.7 % — AB (ref 4.0–5.6)

## 2022-07-05 NOTE — Telephone Encounter (Signed)
Pt called to ask if it is recommended and safe to get the flu shot. Pt says he had a bad reaction in 1986 from the flu shot and almost died. Is it safer now to get the flu shot? Should he get the flu vaccine now that he is age 66 years old?

## 2022-07-07 NOTE — Telephone Encounter (Signed)
Did he have an anaphylactic reaction?  Has he had any other vaccines since?  Was he able to tolerate COVID vaccines?  Its been a while since his reaction but will need to find more details on what happened back then.  Thanks.

## 2022-07-09 NOTE — Telephone Encounter (Signed)
Patient states that he was coughing, body aches, He states that he almost died and he was told back then they were putting live virus in the vaccines. Patient states he had COVID vaccines and tolerated that well. Patient states he got the shingles vaccine recently.    Patient also wants to know he can drink red wine knowing that he is a diabetic.

## 2022-07-09 NOTE — Telephone Encounter (Signed)
Called patient to inform him of provider response 

## 2022-07-09 NOTE — Telephone Encounter (Signed)
Its okay to get flu shot.  He can drink red wine in moderation.  Thanks.

## 2022-07-11 ENCOUNTER — Telehealth: Payer: Self-pay | Admitting: *Deleted

## 2022-07-11 NOTE — Telephone Encounter (Signed)
David Strong contacted the office c/o non healing wound to sternoclavicular joint. Patient had an I & D of a joint abscess on 4/17 and 4/20 of this year. Patient states a small portion of this wound is still not healed. Patient states site will occasionally start bleeding. Denies redness, drainage, or swelling of this area. Patient states area is painful to touch esp when it is bumped into. Patient requesting an appt to be seen for wound evaulation. Appt scheduled for patient to see a PA 12/4. Advised patient to be mindful of wound and to keep area clean and dry with soap and water. Patient verbalized understanding.

## 2022-07-23 ENCOUNTER — Ambulatory Visit: Payer: Medicare Other

## 2022-07-24 ENCOUNTER — Encounter: Payer: Self-pay | Admitting: Thoracic Surgery (Cardiothoracic Vascular Surgery)

## 2022-07-24 ENCOUNTER — Ambulatory Visit: Payer: Medicare Other | Admitting: Thoracic Surgery (Cardiothoracic Vascular Surgery)

## 2022-07-24 VITALS — BP 127/73 | HR 70 | Resp 20 | Ht 64.0 in | Wt 174.0 lb

## 2022-07-24 DIAGNOSIS — M009 Pyogenic arthritis, unspecified: Secondary | ICD-10-CM | POA: Diagnosis not present

## 2022-07-24 NOTE — Progress Notes (Signed)
[address]       [name] [address]             [phone]     HPI: [name] returns for follow-up of a nonhealing sternoclavicular wound.  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  He had an abscess that extended from his left neck down to the pectoralis.  His [place] joint was essentially destroyed.  I did an incision and drainage and VAC placement on 12/04/2021.  I last saw him in August.  His wound was nearly completely healed at that point in time.  Since then he developed an area on the lateral aspect of the incision that bleeds and scabs over.  It is easily traumatized the even with taking a shower.  Past Medical History:  Diagnosis Date   Diabetes mellitus without complication (HCC)     Current Outpatient Medications  Medication Sig Dispense Refill   acetaminophen (TYLENOL) 325 MG tablet Take 2 tablets (650 mg total) by mouth every 6 (six) hours as needed for mild pain, fever or headache (or Fever >/= 101).     dapagliflozin propanediol (FARXIGA) 10 MG TABS tablet Take 1 tablet (10 mg total) by mouth daily. 90 tablet 1   Dulaglutide (TRULICITY) 0.75 MG/0.5ML SOPN Inject 0.75 mg into the skin once a week. 6 mL 3   glipiZIDE (GLUCOTROL XL) 10 MG 24 hr tablet Take 1 tablet (10 mg total) by mouth 2 (two) times daily with a meal. 180 tablet 3   glucose blood (ACCU-CHEK AVIVA PLUS) test strip Use to test blood glucose daily. 100 each 3   Insulin Pen Needle (PEN NEEDLES 3/16") 31G X 5 MM MISC 14 Units by Does not apply route 2 (two) times daily with a meal. 50 each 3   polyethylene glycol (MIRALAX / GLYCOLAX) 17 g packet Take 17 g by mouth daily as needed. 14 each 0   rosuvastatin (CRESTOR) 10 MG tablet Take 1 tablet (10 mg total) by mouth daily. 90 tablet 3   sildenafil (VIAGRA) 100 MG tablet Take 0.5-1 tablets (50-100 mg total) by mouth daily as needed for erectile  dysfunction. (Patient not taking: Reported on 07/24/2022) 5 tablet 11   No current facility-administered medications for this visit.    Physical Exam BP 127/73   Pulse 70   Resp 20   Ht 5\' 4"  (1.626 m)   Wt 174 lb (78.9 kg)   SpO2 100% Comment: RA  BMI 29.43 kg/m  66 year old man in no acute distress Deformity left sternoclavicular joint Incision epithelialized over approximately 95%.  Approximately 3 mm area of granulation tissue with some scabbing laterally.  No erythema or induration.  I cauterized that area with silver nitrate and covered with a dry dressing.  Impression: David Strong is a 66 year old man who presented with pneumonia, bacteremia, and a left sternoclavicular joint abscess requiring incision and drainage and VAC placement.  His wound has nearly completely healed except for a very small area laterally where there was some granulation tissue and eschar.  I cauterized that with silver nitrate.  Hopefully will heal.  We did discuss the option of going back and reexcised the entire wound with a new primary closure.  Hopefully that will not be necessary.  Plan: Return in 2 weeks to check on progress  71, MD [facility] [phone]

## 2022-08-16 ENCOUNTER — Ambulatory Visit: Payer: Medicare Other | Admitting: Thoracic Surgery (Cardiothoracic Vascular Surgery)

## 2022-08-16 VITALS — BP 127/73 | HR 79 | Resp 20 | Ht 64.0 in | Wt 174.0 lb

## 2022-08-16 DIAGNOSIS — Z09 Encounter for follow-up examination after completed treatment for conditions other than malignant neoplasm: Secondary | ICD-10-CM

## 2022-08-16 DIAGNOSIS — M009 Pyogenic arthritis, unspecified: Secondary | ICD-10-CM | POA: Diagnosis not present

## 2022-08-16 NOTE — Progress Notes (Signed)
      [address]       [place] [address]             [phone]    HPI: David Strong returns for follow-up of his left clavicular wound.  David Strong is a 66 year old man who had pneumonia, bacteremia, and a left sternoclavicular joint abscess last summer.  He required I&D and VAC placement for his abscess.  I saw him in the office a couple of weeks ago.  He had some granulation tissue at the lateral aspect of his incision.  Cauterized that with silver nitrate.  He now returns for follow-up.  No new issues in the interim. Current Outpatient Medications  Medication Sig Dispense Refill   acetaminophen (TYLENOL) 325 MG tablet Take 2 tablets (650 mg total) by mouth every 6 (six) hours as needed for mild pain, fever or headache (or Fever >/= 101).     dapagliflozin propanediol (FARXIGA) 10 MG TABS tablet Take 1 tablet (10 mg total) by mouth daily. 90 tablet 1   Dulaglutide (TRULICITY) 0.75 MG/0.5ML SOPN Inject 0.75 mg into the skin once a week. 6 mL 3   glipiZIDE (GLUCOTROL XL) 10 MG 24 hr tablet Take 1 tablet (10 mg total) by mouth 2 (two) times daily with a meal. 180 tablet 3   polyethylene glycol (MIRALAX / GLYCOLAX) 17 g packet Take 17 g by mouth daily as needed. 14 each 0   rosuvastatin (CRESTOR) 10 MG tablet Take 1 tablet (10 mg total) by mouth daily. 90 tablet 3   glucose blood (ACCU-CHEK AVIVA PLUS) test strip Use to test blood glucose daily. (Patient not taking: Reported on 08/16/2022) 100 each 3   Insulin Pen Needle (PEN NEEDLES 3/16") 31G X 5 MM MISC 14 Units by Does not apply route 2 (two) times daily with a meal. (Patient not taking: Reported on 08/16/2022) 50 each 3   sildenafil (VIAGRA) 100 MG tablet Take 0.5-1 tablets (50-100 mg total) by mouth daily as needed for erectile dysfunction. (Patient not taking: Reported on 07/24/2022) 5 tablet 11   No current facility-administered medications for this visit.    Physical Exam BP 127/73   Pulse 79   Resp 20    Ht 5\' 4"  (1.626 m)   Wt 174 lb (78.9 kg)   SpO2 98% Comment: RA  BMI 29.87 kg/m  Well-appearing 66 year old man in no acute distress Scar and deformity in the left clavicular area.  Scabbing and site of previous granulation tissue.  Impression: David Strong is a 66 year old man who had a I&D and VAC placement for a sternoclavicular joint abscess several months ago.  Seen 2 weeks ago for some granulation tissue which was cauterized with silver nitrate.  Has now scabbed over.  Plan: He will call if he has any other issues in regards to his wound.  71, MD [facility] [phone]

## 2022-08-17 ENCOUNTER — Telehealth: Payer: Self-pay | Admitting: Emergency Medicine

## 2022-08-17 MED ORDER — DAPAGLIFLOZIN PROPANEDIOL 10 MG PO TABS: 10.0000 mg | ORAL_TABLET | Freq: Every day | ORAL | 1 refills | Status: DC

## 2022-08-17 NOTE — Telephone Encounter (Signed)
Caller & Relationship to patient: Self  Call back number:[phone]   Date of last office visit:   Date of next office visit:  10/10/2022   Medication(s) to be refilled:  [place]        Preferred Pharmacy:  [facility] on [address], [place]

## 2022-08-17 NOTE — Telephone Encounter (Signed)
Medication sent to pharmacy  

## 2022-10-10 ENCOUNTER — Encounter: Payer: Self-pay | Admitting: Emergency Medicine

## 2022-10-10 ENCOUNTER — Ambulatory Visit (INDEPENDENT_AMBULATORY_CARE_PROVIDER_SITE_OTHER): Payer: Medicare Other | Admitting: Emergency Medicine

## 2022-10-10 VITALS — BP 128/72 | HR 69 | Temp 98.2°F | Ht 64.0 in | Wt 174.4 lb

## 2022-10-10 DIAGNOSIS — K4090 Unilateral inguinal hernia, without obstruction or gangrene, not specified as recurrent: Secondary | ICD-10-CM

## 2022-10-10 DIAGNOSIS — E1165 Type 2 diabetes mellitus with hyperglycemia: Secondary | ICD-10-CM | POA: Diagnosis not present

## 2022-10-10 DIAGNOSIS — K5904 Chronic idiopathic constipation: Secondary | ICD-10-CM

## 2022-10-10 LAB — POCT GLYCOSYLATED HEMOGLOBIN (HGB A1C): Hemoglobin A1C: 8.4 % — AB (ref 4.0–5.6)

## 2022-10-10 MED ORDER — TRULANCE 3 MG PO TABS: 3.0000 mg | ORAL_TABLET | Freq: Every day | ORAL | 3 refills | Status: AC

## 2022-10-10 NOTE — Assessment & Plan Note (Signed)
Still present.  Nontender. Was seen and evaluated by general surgeon.

## 2022-10-10 NOTE — Patient Instructions (Signed)

## 2022-10-10 NOTE — Assessment & Plan Note (Signed)
Uncontrolled diabetes with hemoglobin A1c at 8.4. Recommend to increase Trulicity to 1.5 mg weekly and continue Farxiga 10 mg daily and glipizide 10 mg twice a day. Cardiovascular risks associated with uncontrolled diabetes discussed. Diet and nutrition discussed. Continue rosuvastatin 10 mg daily. Follow-up in 3 months.

## 2022-10-10 NOTE — Assessment & Plan Note (Signed)
Advised to stay well-hydrated and increase amount of daily fiber in his diet Unresponsive to over-the-counter medications Recommend to try Trulance 3 mg daily

## 2022-10-10 NOTE — Progress Notes (Signed)
David Strong 67 y.o.   Chief Complaint  Patient presents with   Follow-up    3 mtnh f/u appt, no concerns     HISTORY OF PRESENT ILLNESS: This is a 67 y.o. male A1A here for 44-monthfollow-up of diabetes. Compliant with medications but not eating very well Complaining of chronic constipation for years Right inguinal hernia.  Was evaluated by surgeon.  Not hurting. No other complaints or medical concerns today. Lab Results  Component Value Date   HGBA1C 6.7 (A) 07/05/2022     HPI   Prior to Admission medications   Medication Sig Start Date End Date Taking? Authorizing Provider  acetaminophen (TYLENOL) 325 MG tablet Take 2 tablets (650 mg total) by mouth every 6 (six) hours as needed for mild pain, fever or headache (or Fever >/= 101). 12/02/21  Yes LOswald Hillock MD  dapagliflozin propanediol (FARXIGA) 10 MG TABS tablet Take 1 tablet (10 mg total) by mouth daily. 08/17/22  Yes Abdifatah Colquhoun, MInes Bloomer MD  Dulaglutide (TRULICITY) [id number] Inject 0.75 mg into the skin once a week. 07/04/22  Yes Coni Homesley, MInes Bloomer MD  glipiZIDE (GLUCOTROL XL) 10 MG 24 hr tablet Take 1 tablet (10 mg total) by mouth 2 (two) times daily with a meal. 07/04/22 06/29/23 Yes Eesa Justiss, MInes Bloomer MD  polyethylene glycol (MIRALAX / GLYCOLAX) 17 g packet Take 17 g by mouth daily as needed. 12/02/21  Yes LOswald Hillock MD  rosuvastatin (CRESTOR) 10 MG tablet Take 1 tablet (10 mg total) by mouth daily. 03/29/22  Yes Tinea Nobile, MInes Bloomer MD  glucose blood (ACCU-CHEK AVIVA PLUS) test strip Use to test blood glucose daily. Patient not taking: Reported on 08/16/2022 07/20/21   SHorald Pollen MD  Insulin Pen Needle (PEN NEEDLES 3/16") 31G X 5 MM MISC 14 Units by Does not apply route 2 (two) times daily with a meal. Patient not taking: Reported on 08/16/2022 12/02/21   LOswald Hillock MD  sildenafil (VIAGRA) 100 MG tablet Take 0.5-1 tablets (50-100 mg total) by mouth daily as needed for erectile  dysfunction. Patient not taking: Reported on 07/24/2022 10/12/21   SHorald Pollen MD    Allergies  Allergen Reactions   Codeine Itching   Glipizide Er [Glipizide]     SOB, malaise. He tolerates XL without issues.   Metformin And Related Rash    Patient Active Problem List   Diagnosis Date Noted   Chronic pain of left knee 03/29/2022   Obese 12/05/2021   Bilateral leg edema 11/28/2021   DVT (deep venous thrombosis) ([facility] 11/27/2021   Right inguinal hernia 10/12/2021   Type 2 diabetes mellitus ([place] 06/07/2015   Leucopenia 08/03/2012   Thrombocytopenia ([facility] 08/03/2012    Past Medical History:  Diagnosis Date   Diabetes mellitus without complication ([facility]     Past Surgical History:  Procedure Laterality Date   APPLICATION OF WOUND VAC Left 12/07/2021   Procedure: WOUND VAC CHANGE;  Surgeon: HMelrose Nakayama MD;  [place]  Service: Vascular;  Laterality: Left;   INCISION AND DRAINAGE OF WOUND Left 12/04/2021   Procedure: IRRIGATION AND DEBRIDMENT OF STERNOCLAVICULAR WOUND, WOUND VAC PLACMENT.;  Surgeon: HMelrose Nakayama MD;  [place] [facility]  Service: Vascular;  Laterality: Left;   IRRIGATION AND DEBRIDEMENT ABSCESS Left 12/07/2021   Procedure: IRRIGATION AND DEBRIDEMENT ABSCESS;  Surgeon: HMelrose Nakayama MD;  [place]  Service: Vascular;  Laterality: Left;   TEE WITHOUT CARDIOVERSION N/A 12/08/2021   Procedure:  TRANSESOPHAGEAL ECHOCARDIOGRAM (TEE);  Surgeon: Werner Lean, MD;  Location: [facility];  Service: Cardiovascular;  Laterality: N/A;    Social History   Socioeconomic History   Marital status: Married    Spouse name: Alinda Sierras   Number of children: 3   Years of education: college   Highest education level: Not on file  Occupational History   Not on file  Tobacco Use   Smoking status: Never   Smokeless tobacco: Never  Vaping Use   Vaping Use: Never used  Substance and Sexual Activity   Alcohol use: Not Currently     Comment: occasional   Drug use: No   Sexual activity: Yes  Other Topics Concern   Not on file  Social History Narrative   Not on file   Social Determinants of Health   Financial Resource Strain: Low Risk  (06/28/2022)   Overall Financial Resource Strain (CARDIA)    Difficulty of Paying Living Expenses: Not hard at all  Food Insecurity: No Food Insecurity (06/28/2022)   Hunger Vital Sign    Worried About Running Out of Food in the Last Year: Never true    Ran Out of Food in the Last Year: Never true  Transportation Needs: No Transportation Needs (06/28/2022)   PRAPARE - Hydrologist (Medical): No    Lack of Transportation (Non-Medical): No  Physical Activity: Insufficiently Active (06/28/2022)   Exercise Vital Sign    Days of Exercise per Week: 2 days    Minutes of Exercise per Session: 60 min  Stress: No Stress Concern Present (06/28/2022)   [place]    Feeling of Stress : Not at all  Social Connections: [place] (06/28/2022)   Social Connection and Isolation Panel [NHANES]    Frequency of Communication with Friends and Family: More than three times a week    Frequency of Social Gatherings with Friends and Family: More than three times a week    Attends Religious Services: 1 to 4 times per year    Active Member of [employer] or Organizations: Yes    Attends Music therapist: More than 4 times per year    Marital Status: Married  Human resources officer Violence: Not At Risk (06/28/2022)   Humiliation, Afraid, Rape, and Kick questionnaire    Fear of Current or Ex-Partner: No    Emotionally Abused: No    Physically Abused: No    Sexually Abused: No    Family History  Adopted: Yes     Review of Systems  Constitutional: Negative.  Negative for chills and fever.  HENT: Negative.  Negative for congestion and sore throat.   Respiratory: Negative.  Negative for cough  and shortness of breath.   Cardiovascular: Negative.  Negative for chest pain and palpitations.  Gastrointestinal:  Positive for constipation.  Genitourinary: Negative.  Negative for dysuria and hematuria.  Musculoskeletal: Negative.   Skin: Negative.  Negative for rash.  Neurological: Negative.  Negative for dizziness and headaches.  All other systems reviewed and are negative.  Today's Vitals   10/10/22 1511  BP: 128/72  Pulse: 69  Temp: 98.2 F (36.8 C)  TempSrc: Oral  SpO2: 97%  Weight: 174 lb 6 oz (79.1 kg)  Height: 5' 4"$  (1.626 m)   Body mass index is 29.93 kg/m.   Physical Exam Vitals reviewed.  Constitutional:      Appearance: Normal appearance.  HENT:     Head: Normocephalic.  Mouth/Throat:     Mouth: Mucous membranes are moist.     Pharynx: Oropharynx is clear.  Eyes:     Extraocular Movements: Extraocular movements intact.     Conjunctiva/sclera: Conjunctivae normal.     Pupils: Pupils are equal, round, and reactive to light.  Cardiovascular:     Rate and Rhythm: Normal rate and regular rhythm.     Pulses: Normal pulses.     Heart sounds: Normal heart sounds.  Pulmonary:     Effort: Pulmonary effort is normal.     Breath sounds: Normal breath sounds.  Abdominal:     Palpations: Abdomen is soft.     Tenderness: There is no abdominal tenderness.  Musculoskeletal:     Cervical back: No tenderness.  Lymphadenopathy:     Cervical: No cervical adenopathy.  Skin:    General: Skin is warm and dry.  Neurological:     General: No focal deficit present.     Mental Status: He is alert and oriented to person, place, and time.  Psychiatric:        Mood and Affect: Mood normal.        Behavior: Behavior normal.    Results for orders placed or performed in visit on 10/10/22 (from the past 24 hour(s))  POCT HgB A1C     Status: Abnormal   Collection Time: 10/10/22  4:51 PM  Result Value Ref Range   Hemoglobin A1C 8.4 (A) 4.0 - 5.6 %   HbA1c POC (<>  result, manual entry)     HbA1c, POC (prediabetic range)     HbA1c, POC (controlled diabetic range)       ASSESSMENT & PLAN: A total of 44 minutes was spent with the patient and counseling/coordination of care regarding preparing for this visit, review of most recent office visit notes, review of most recent blood work results including interpretation of today's hemoglobin A1c, cardiovascular risks associated with uncontrolled diabetes, education on nutrition, review of all medications and changes made, review of health maintenance items, treatment of chronic constipation, assessment of right inguinal hernia, prognosis, documentation, and need for follow-up in 3 months  Problem List Items Addressed This Visit       Digestive   Chronic idiopathic constipation    Advised to stay well-hydrated and increase amount of daily fiber in his diet Unresponsive to over-the-counter medications Recommend to try Trulance 3 mg daily      Relevant Medications   Plecanatide (TRULANCE) 3 MG TABS     Endocrine   Type 2 diabetes mellitus ([place]) - Primary    Uncontrolled diabetes with hemoglobin A1c at 8.4. Recommend to increase Trulicity to 1.5 mg weekly and continue Farxiga 10 mg daily and glipizide 10 mg twice a day. Cardiovascular risks associated with uncontrolled diabetes discussed. Diet and nutrition discussed. Continue rosuvastatin 10 mg daily. Follow-up in 3 months.      Relevant Orders   POCT HgB A1C     Other   Right inguinal hernia    Still present.  Nontender. Was seen and evaluated by general surgeon.      Patient Instructions  Diabetes Mellitus and Nutrition, Adult When you have diabetes, or diabetes mellitus, it is very important to have healthy eating habits because your blood sugar (glucose) levels are greatly affected by what you eat and drink. Eating healthy foods in the right amounts, at about the same times every day, can help you: Manage your blood glucose. Lower your  risk of heart disease. Improve your  blood pressure. Reach or maintain a healthy weight. What can affect my meal plan? Every person with diabetes is different, and each person has different needs for a meal plan. Your health care provider may recommend that you work with a dietitian to make a meal plan that is best for you. Your meal plan may vary depending on factors such as: The calories you need. The medicines you take. Your weight. Your blood glucose, blood pressure, and cholesterol levels. Your activity level. Other health conditions you have, such as heart or kidney disease. How do carbohydrates affect me? Carbohydrates, also called carbs, affect your blood glucose level more than any other type of food. Eating carbs raises the amount of glucose in your blood. It is important to know how many carbs you can safely have in each meal. This is different for every person. Your dietitian can help you calculate how many carbs you should have at each meal and for each snack. How does alcohol affect me? Alcohol can cause a decrease in blood glucose (hypoglycemia), especially if you use insulin or take certain diabetes medicines by mouth. Hypoglycemia can be a life-threatening condition. Symptoms of hypoglycemia, such as sleepiness, dizziness, and confusion, are similar to symptoms of having too much alcohol. Do not drink alcohol if: Your health care provider tells you not to drink. You are pregnant, may be pregnant, or are planning to become pregnant. If you drink alcohol: Limit how much you have to: 0-1 drink a day for women. 0-2 drinks a day for men. Know how much alcohol is in your drink. In the [place] one drink equals one 12 oz bottle of beer (355 mL), one 5 oz glass of wine (148 mL), or one 1 oz glass of hard liquor (44 mL). Keep yourself hydrated with water, diet soda, or unsweetened iced tea. Keep in mind that regular soda, juice, and other mixers may contain a lot of sugar and must be  counted as carbs. What are tips for following this plan?  Reading food labels Start by checking the serving size on the Nutrition Facts label of packaged foods and drinks. The number of calories and the amount of carbs, fats, and other nutrients listed on the label are based on one serving of the item. Many items contain more than one serving per package. Check the total grams (g) of carbs in one serving. Check the number of grams of saturated fats and trans fats in one serving. Choose foods that have a low amount or none of these fats. Check the number of milligrams (mg) of salt (sodium) in one serving. Most people should limit total sodium intake to less than 2,300 mg per day. Always check the nutrition information of foods labeled as "low-fat" or "nonfat." These foods may be higher in added sugar or refined carbs and should be avoided. Talk to your dietitian to identify your daily goals for nutrients listed on the label. Shopping Avoid buying canned, pre-made, or processed foods. These foods tend to be high in fat, sodium, and added sugar. Shop around the outside edge of the grocery store. This is where you will most often find fresh fruits and vegetables, bulk grains, fresh meats, and fresh dairy products. Cooking Use low-heat cooking methods, such as baking, instead of high-heat cooking methods, such as deep frying. Cook using healthy oils, such as olive, canola, or sunflower oil. Avoid cooking with butter, cream, or high-fat meats. Meal planning Eat meals and snacks regularly, preferably at the same times every  day. Avoid going long periods of time without eating. Eat foods that are high in fiber, such as fresh fruits, vegetables, beans, and whole grains. Eat 4-6 oz (112-168 g) of lean protein each day, such as lean meat, chicken, fish, eggs, or tofu. One ounce (oz) (28 g) of lean protein is equal to: 1 oz (28 g) of meat, chicken, or fish. 1 egg.  cup (62 g) of tofu. Eat some foods  each day that contain healthy fats, such as avocado, nuts, seeds, and fish. What foods should I eat? Fruits Berries. Apples. Oranges. Peaches. Apricots. Plums. Grapes. Mangoes. Papayas. Pomegranates. Kiwi. Cherries. Vegetables Leafy greens, including lettuce, spinach, kale, chard, collard greens, mustard greens, and cabbage. Beets. Cauliflower. Broccoli. Carrots. Green beans. Tomatoes. Peppers. Onions. Cucumbers. Brussels sprouts. Grains Whole grains, such as whole-wheat or whole-grain bread, crackers, tortillas, cereal, and pasta. Unsweetened oatmeal. Quinoa. Brown or wild rice. Meats and other proteins Seafood. Poultry without skin. Lean cuts of poultry and beef. Tofu. Nuts. Seeds. Dairy Low-fat or fat-free dairy products such as milk, yogurt, and cheese. The items listed above may not be a complete list of foods and beverages you can eat and drink. Contact a dietitian for more information. What foods should I avoid? Fruits Fruits canned with syrup. Vegetables Canned vegetables. Frozen vegetables with butter or cream sauce. Grains Refined white flour and flour products such as bread, pasta, snack foods, and cereals. Avoid all processed foods. Meats and other proteins Fatty cuts of meat. Poultry with skin. Breaded or fried meats. Processed meat. Avoid saturated fats. Dairy Full-fat yogurt, cheese, or milk. Beverages Sweetened drinks, such as soda or iced tea. The items listed above may not be a complete list of foods and beverages you should avoid. Contact a dietitian for more information. Questions to ask a health care provider Do I need to meet with a certified diabetes care and education specialist? Do I need to meet with a dietitian? What number can I call if I have questions? When are the best times to check my blood glucose? Where to find more information: American Diabetes Association: diabetes.org Academy of Nutrition and Dietetics: eatright.[employer] of  Diabetes and Digestive and Kidney Diseases: [employer]is Association of Diabetes Care & Education Specialists: diabeteseducator.org Summary It is important to have healthy eating habits because your blood sugar (glucose) levels are greatly affected by what you eat and drink. It is important to use alcohol carefully. A healthy meal plan will help you manage your blood glucose and lower your risk of heart disease. Your health care provider may recommend that you work with a dietitian to make a meal plan that is best for you. This information is not intended to replace advice given to you by your health care provider. Make sure you discuss any questions you have with your health care provider. Document Revised: 03/09/2020 Document Reviewed: 03/09/2020 Elsevier Patient Education  [place], MD [facility]

## 2022-10-30 NOTE — Progress Notes (Signed)
This encounter was created in error - please disregard.  This encounter was created in error - please disregard.

## 2022-11-02 ENCOUNTER — Telehealth: Payer: Self-pay | Admitting: Emergency Medicine

## 2022-11-02 DIAGNOSIS — E1165 Type 2 diabetes mellitus with hyperglycemia: Secondary | ICD-10-CM

## 2022-11-02 MED ORDER — GLIPIZIDE ER 10 MG PO TB24: 10.0000 mg | ORAL_TABLET | Freq: Two times a day (BID) | ORAL | 3 refills | Status: DC

## 2022-11-02 MED ORDER — DAPAGLIFLOZIN PROPANEDIOL 10 MG PO TABS: 10.0000 mg | ORAL_TABLET | Freq: Every day | ORAL | 1 refills | Status: DC

## 2022-11-02 NOTE — Telephone Encounter (Signed)
Prescription Request  11/02/2022  LOV: 10/10/2022  What is the name of the medication or equipment? dapagliflozin propanediol (FARXIGA) 10 MG TABS tablet   Have you contacted your pharmacy to request a refill? Yes   Which pharmacy would you like this sent to?  [id number] - [place], [place] [place] [address] Phone: [phone] Fax: [phone]    Patient notified that their request is being sent to the clinical staff for review and that they should receive a response within 2 business days.   Please advise at [facility] [phone]

## 2022-11-02 NOTE — Telephone Encounter (Signed)
Called patient and sent medication to patient pharmacy

## 2022-11-20 ENCOUNTER — Other Ambulatory Visit: Payer: Self-pay | Admitting: Emergency Medicine

## 2022-11-20 DIAGNOSIS — E1165 Type 2 diabetes mellitus with hyperglycemia: Secondary | ICD-10-CM

## 2022-12-18 ENCOUNTER — Other Ambulatory Visit: Payer: Self-pay | Admitting: *Deleted

## 2022-12-18 ENCOUNTER — Telehealth: Payer: Self-pay | Admitting: Emergency Medicine

## 2022-12-18 DIAGNOSIS — E1165 Type 2 diabetes mellitus with hyperglycemia: Secondary | ICD-10-CM

## 2022-12-18 MED ORDER — GLIPIZIDE ER 10 MG PO TB24: 10.0000 mg | ORAL_TABLET | Freq: Two times a day (BID) | ORAL | 3 refills | Status: DC

## 2022-12-18 NOTE — Telephone Encounter (Signed)
Prescription Request  12/18/2022  LOV: 10/10/2022  What is the name of the medication or equipment? glipiZIDE (GLUCOTROL XL) 10 MG 24 hr tablet   Have you contacted your pharmacy to request a refill? Yes   Which pharmacy would you like this sent to?  [id number] [place], [place] - [phone] AT [facility] & [address] [place] [address] Phone: [phone] Fax: [phone]    Patient notified that their request is being sent to the clinical staff for review and that they should receive a response within 2 business days.   Please advise at [facility] [phone]    [facility] but [facility]'t currently [facility] the above medication.

## 2022-12-18 NOTE — Telephone Encounter (Signed)
New medication sent to patient pharmacy  

## 2022-12-18 NOTE — Progress Notes (Signed)
New medication sent to patient requested pharmacy 

## 2022-12-24 ENCOUNTER — Other Ambulatory Visit: Payer: Self-pay | Admitting: *Deleted

## 2022-12-24 DIAGNOSIS — E1165 Type 2 diabetes mellitus with hyperglycemia: Secondary | ICD-10-CM

## 2022-12-24 MED ORDER — GLIPIZIDE ER 10 MG PO TB24: 10.0000 mg | ORAL_TABLET | Freq: Two times a day (BID) | ORAL | 3 refills | Status: DC

## 2022-12-24 NOTE — Telephone Encounter (Signed)
Patient called and said they filled glipizide ER instead of XL. They told his to call his doctor's office to get it fixed, but the chart clearly says the XL was sent in. Can this be re-sent to the pharmacy to fix it? Best callback is [phone].

## 2022-12-24 NOTE — Telephone Encounter (Signed)
New prescription sent to patient pharmacy.

## 2022-12-28 ENCOUNTER — Other Ambulatory Visit: Payer: Self-pay | Admitting: *Deleted

## 2022-12-28 DIAGNOSIS — E1165 Type 2 diabetes mellitus with hyperglycemia: Secondary | ICD-10-CM

## 2022-12-28 MED ORDER — GLIPIZIDE ER 10 MG PO TB24
10.0000 mg | ORAL_TABLET | Freq: Two times a day (BID) | ORAL | 3 refills | Status: DC
Start: 2022-12-28 — End: 2023-01-02

## 2022-12-28 NOTE — Telephone Encounter (Signed)
Patient said glipizide xl was not received - please resend

## 2022-12-28 NOTE — Telephone Encounter (Signed)
Re sent medication to patient requested pharmacy

## 2023-01-02 ENCOUNTER — Other Ambulatory Visit: Payer: Self-pay | Admitting: *Deleted

## 2023-01-02 DIAGNOSIS — E1165 Type 2 diabetes mellitus with hyperglycemia: Secondary | ICD-10-CM

## 2023-01-02 MED ORDER — GLIPIZIDE ER 10 MG PO TB24
10.0000 mg | ORAL_TABLET | Freq: Two times a day (BID) | ORAL | 3 refills | Status: DC
Start: 2023-01-02 — End: 2023-01-08

## 2023-01-02 NOTE — Telephone Encounter (Signed)
Sent prescription to patient requested pharmacy

## 2023-01-02 NOTE — Telephone Encounter (Signed)
Patient's pharmacy told him they no longer carry the XL form of glipizide. He would like for the prescription to be sent to [facility] on [address], so he can get the correct medication. Best callback is [phone].

## 2023-01-08 ENCOUNTER — Ambulatory Visit (INDEPENDENT_AMBULATORY_CARE_PROVIDER_SITE_OTHER): Payer: Medicare Other | Admitting: Emergency Medicine

## 2023-01-08 ENCOUNTER — Encounter: Payer: Self-pay | Admitting: Emergency Medicine

## 2023-01-08 VITALS — BP 120/72 | HR 70 | Temp 98.3°F | Ht 64.0 in | Wt 173.0 lb

## 2023-01-08 DIAGNOSIS — D696 Thrombocytopenia, unspecified: Secondary | ICD-10-CM

## 2023-01-08 DIAGNOSIS — E1165 Type 2 diabetes mellitus with hyperglycemia: Secondary | ICD-10-CM

## 2023-01-08 DIAGNOSIS — Z7985 Long-term (current) use of injectable non-insulin antidiabetic drugs: Secondary | ICD-10-CM | POA: Diagnosis not present

## 2023-01-08 DIAGNOSIS — E1169 Type 2 diabetes mellitus with other specified complication: Secondary | ICD-10-CM | POA: Diagnosis not present

## 2023-01-08 DIAGNOSIS — E785 Hyperlipidemia, unspecified: Secondary | ICD-10-CM | POA: Diagnosis not present

## 2023-01-08 DIAGNOSIS — Z7984 Long term (current) use of oral hypoglycemic drugs: Secondary | ICD-10-CM | POA: Diagnosis not present

## 2023-01-08 LAB — CBC WITH DIFFERENTIAL/PLATELET
Basophils Absolute: 0 10*3/uL (ref 0.0–0.1)
Basophils Relative: 0.2 % (ref 0.0–3.0)
Eosinophils Absolute: 0 10*3/uL (ref 0.0–0.7)
Eosinophils Relative: 1.4 % (ref 0.0–5.0)
HCT: 48.9 % (ref 39.0–52.0)
Hemoglobin: 16.2 g/dL (ref 13.0–17.0)
Lymphocytes Relative: 41.8 % (ref 12.0–46.0)
Lymphs Abs: 1.3 10*3/uL (ref 0.7–4.0)
MCHC: 33.2 g/dL (ref 30.0–36.0)
MCV: 88.9 fl (ref 78.0–100.0)
Monocytes Absolute: 0.2 10*3/uL (ref 0.1–1.0)
Monocytes Relative: 7.2 % (ref 3.0–12.0)
Neutro Abs: 1.5 10*3/uL (ref 1.4–7.7)
Neutrophils Relative %: 49.4 % (ref 43.0–77.0)
Platelets: 107 10*3/uL — ABNORMAL LOW (ref 150.0–400.0)
RBC: 5.5 Mil/uL (ref 4.22–5.81)
RDW: 14.2 % (ref 11.5–15.5)
WBC: 3.1 10*3/uL — ABNORMAL LOW (ref 4.0–10.5)

## 2023-01-08 LAB — POCT GLYCOSYLATED HEMOGLOBIN (HGB A1C): Hemoglobin A1C: 7.5 % — AB (ref 4.0–5.6)

## 2023-01-08 LAB — LIPID PANEL
Cholesterol: 109 mg/dL (ref 0–200)
HDL: 40.5 mg/dL (ref >39.00)
LDL Cholesterol: 46 mg/dL (ref 0–99)
NonHDL: 68.92
Total CHOL/HDL Ratio: 3
Triglycerides: 113 mg/dL (ref 0.0–149.0)
VLDL: 22.6 mg/dL (ref 0.0–40.0)

## 2023-01-08 LAB — COMPREHENSIVE METABOLIC PANEL
ALT: 22 U/L (ref 0–53)
AST: 26 U/L (ref 0–37)
Albumin: 4.4 g/dL (ref 3.5–5.2)
Alkaline Phosphatase: 58 U/L (ref 39–117)
BUN: 13 mg/dL (ref 6–23)
CO2: 28 mEq/L (ref 19–32)
Calcium: 9.3 mg/dL (ref 8.4–10.5)
Chloride: 104 mEq/L (ref 96–112)
Creatinine, Ser: 0.99 mg/dL (ref 0.40–1.50)
GFR: 79.36 mL/min (ref >60.00)
Glucose, Bld: 190 mg/dL — ABNORMAL HIGH (ref 70–99)
Potassium: 4 mEq/L (ref 3.5–5.1)
Sodium: 139 mEq/L (ref 135–145)
Total Bilirubin: 0.6 mg/dL (ref 0.2–1.2)
Total Protein: 6.8 g/dL (ref 6.0–8.3)

## 2023-01-08 LAB — MICROALBUMIN / CREATININE URINE RATIO
Creatinine,U: 65.9 mg/dL
Microalb Creat Ratio: 1.1 mg/g (ref 0.0–30.0)
Microalb, Ur: <0.7 mg/dL (ref 0.0–1.9)

## 2023-01-08 MED ORDER — GLIPIZIDE ER 10 MG PO TB24
10.0000 mg | ORAL_TABLET | Freq: Two times a day (BID) | ORAL | 3 refills | Status: DC
Start: 2023-01-08 — End: 2023-01-13

## 2023-01-08 NOTE — Progress Notes (Signed)
David Strong 67 y.o.   Chief Complaint  Patient presents with   Medical Management of Chronic Issues    f/u appt, no concerns     HISTORY OF PRESENT ILLNESS: This is a 67 y.o. male here for 46-month follow-up of diabetes Overall doing well. No other complaints or medical concerns today. Lab Results  Component Value Date   HGBA1C 8.4 (A) 10/10/2022   Wt Readings from Last 3 Encounters:  01/08/23 173 lb (78.5 kg)  10/10/22 174 lb 6 oz (79.1 kg)  08/16/22 174 lb (78.9 kg)     HPI   Prior to Admission medications   Medication Sig Start Date End Date Taking? Authorizing Provider  ACCU-CHEK AVIVA PLUS test strip USE TO TEST BLOOD GLUCOSE DAILY 11/20/22  Yes Karlton Maya, Eilleen Kempf, MD  acetaminophen (TYLENOL) 325 MG tablet Take 2 tablets (650 mg total) by mouth every 6 (six) hours as needed for mild pain, fever or headache (or Fever >/= 101). 12/02/21  Yes Meredeth Ide, MD  dapagliflozin propanediol (FARXIGA) 10 MG TABS tablet Take 1 tablet (10 mg total) by mouth daily. 11/02/22  Yes Emberley Kral, Eilleen Kempf, MD  Dulaglutide (TRULICITY) 0.75 MG/0.5ML SOPN Inject 0.75 mg into the skin once a week. 07/04/22  Yes Alilah Mcmeans, Eilleen Kempf, MD  glipiZIDE (GLUCOTROL XL) 10 MG 24 hr tablet Take 1 tablet (10 mg total) by mouth 2 (two) times daily with a meal. 01/02/23 12/28/23 Yes Novice Vrba, Eilleen Kempf, MD  rosuvastatin (CRESTOR) 10 MG tablet Take 1 tablet (10 mg total) by mouth daily. 03/29/22  Yes Kolter Reaver, Eilleen Kempf, MD  Insulin Pen Needle (PEN NEEDLES 3/16") 31G X 5 MM MISC 14 Units by Does not apply route 2 (two) times daily with a meal. Patient not taking: Reported on 08/16/2022 12/02/21   Meredeth Ide, MD  Plecanatide (TRULANCE) 3 MG TABS Take 1 tablet (3 mg total) by mouth daily. 10/10/22   [name], MD  polyethylene glycol (MIRALAX / GLYCOLAX) 17 g packet Take 17 g by mouth daily as needed. 12/02/21   Meredeth Ide, MD  sildenafil (VIAGRA) 100 MG tablet Take 0.5-1 tablets  (50-100 mg total) by mouth daily as needed for erectile dysfunction. Patient not taking: Reported on 07/24/2022 10/12/21   [name], MD    Allergies  Allergen Reactions   Codeine Itching   Glipizide Er [Glipizide]     SOB, malaise. He tolerates XL without issues.   Metformin And Related Rash    Patient Active Problem List   Diagnosis Date Noted   Chronic idiopathic constipation 10/10/2022   Chronic pain of left knee 03/29/2022   Obese 12/05/2021   Bilateral leg edema 11/28/2021   DVT (deep venous thrombosis) (HCC) 11/27/2021   Right inguinal hernia 10/12/2021   Type 2 diabetes mellitus (HCC) 06/07/2015   Leucopenia 08/03/2012   Thrombocytopenia (HCC) 08/03/2012    Past Medical History:  Diagnosis Date   Diabetes mellitus without complication [facility])     Past Surgical History:  Procedure Laterality Date   APPLICATION OF WOUND VAC Left 12/07/2021   Procedure: WOUND VAC CHANGE;  Surgeon: [name], MD;  Location: [facility] OR;  Service: Vascular;  Laterality: Left;   INCISION AND DRAINAGE OF WOUND Left 12/04/2021   Procedure: IRRIGATION AND DEBRIDMENT OF STERNOCLAVICULAR WOUND, WOUND VAC PLACMENT.;  Surgeon: [name], MD;  Location: [facility] OR;  Service: Vascular;  Laterality: Left;   IRRIGATION AND DEBRIDEMENT ABSCESS Left 12/07/2021   Procedure: IRRIGATION AND  DEBRIDEMENT ABSCESS;  Surgeon: [name], MD;  Location: [facility] OR;  Service: Vascular;  Laterality: Left;   TEE WITHOUT CARDIOVERSION N/A 12/08/2021   Procedure: TRANSESOPHAGEAL ECHOCARDIOGRAM (TEE);  Surgeon: Christell Constant, MD;  Location: [facility] ENDOSCOPY;  Service: Cardiovascular;  Laterality: N/A;    Social History   Socioeconomic History   Marital status: Married    Spouse name: [name]   Number of children: 3   Years of education: college   Highest education level: Not on file  Occupational History   Not on file  Tobacco Use   Smoking status: Never   Smokeless tobacco:  Never  Vaping Use   Vaping Use: Never used  Substance and Sexual Activity   Alcohol use: Not Currently    Comment: occasional   Drug use: No   Sexual activity: Yes  Other Topics Concern   Not on file  Social History Narrative   Not on file   Social Determinants of Health   Financial Resource Strain: Low Risk  (06/28/2022)   Overall Financial Resource Strain (CARDIA)    Difficulty of Paying Living Expenses: Not hard at all  Food Insecurity: No Food Insecurity (06/28/2022)   Hunger Vital Sign    Worried About Running Out of Food in the Last Year: Never true    Ran Out of Food in the Last Year: Never true  Transportation Needs: No Transportation Needs (06/28/2022)   PRAPARE - Administrator, Civil Service (Medical): No    Lack of Transportation (Non-Medical): No  Physical Activity: Insufficiently Active (06/28/2022)   Exercise Vital Sign    Days of Exercise per Week: 2 days    Minutes of Exercise per Session: 60 min  Stress: No Stress Concern Present (06/28/2022)   [employer] of Occupational Health - Occupational Stress Questionnaire    Feeling of Stress : Not at all  Social Connections: Socially Integrated (06/28/2022)   Social Connection and Isolation Panel [NHANES]    Frequency of Communication with Friends and Family: More than three times a week    Frequency of Social Gatherings with Friends and Family: More than three times a week    Attends Religious Services: 1 to 4 times per year    Active Member of [employer] or Organizations: Yes    Attends Engineer, structural: More than 4 times per year    Marital Status: Married  Catering manager Violence: Not At Risk (06/28/2022)   Humiliation, Afraid, Rape, and Kick questionnaire    Fear of Current or Ex-Partner: No    Emotionally Abused: No    Physically Abused: No    Sexually Abused: No    Family History  Adopted: Yes     Review of Systems  Constitutional: Negative.  Negative for chills and fever.   HENT: Negative.  Negative for congestion and sore throat.   Respiratory: Negative.  Negative for cough and shortness of breath.   Cardiovascular: Negative.  Negative for chest pain and palpitations.  Gastrointestinal:  Negative for abdominal pain, diarrhea, nausea and vomiting.  Genitourinary: Negative.  Negative for dysuria and hematuria.  Skin: Negative.  Negative for rash.  Neurological: Negative.  Negative for dizziness and headaches.  All other systems reviewed and are negative.   Vitals:   01/08/23 1442  BP: 120/72  Pulse: 70  Temp: 98.3 F (36.8 C)  SpO2: 97%    Physical Exam Vitals reviewed.  Constitutional:      Appearance: Normal appearance.  HENT:  Head: Normocephalic.     Mouth/Throat:     Mouth: Mucous membranes are moist.     Pharynx: Oropharynx is clear.  Eyes:     Extraocular Movements: Extraocular movements intact.     Pupils: Pupils are equal, round, and reactive to light.  Cardiovascular:     Rate and Rhythm: Normal rate and regular rhythm.     Pulses: Normal pulses.     Heart sounds: Normal heart sounds.  Pulmonary:     Effort: Pulmonary effort is normal.     Breath sounds: Normal breath sounds.  Musculoskeletal:     Cervical back: No tenderness.  Lymphadenopathy:     Cervical: No cervical adenopathy.  Skin:    General: Skin is warm and dry.     Capillary Refill: Capillary refill takes less than 2 seconds.  Neurological:     Mental Status: He is alert and oriented to person, place, and time.  Psychiatric:        Mood and Affect: Mood normal.        Behavior: Behavior normal.    Results for orders placed or performed in visit on 01/08/23 (from the past 24 hour(s))  POCT HgB A1C     Status: Abnormal   Collection Time: 01/08/23  3:18 PM  Result Value Ref Range   Hemoglobin A1C 7.5 (A) 4.0 - 5.6 %   HbA1c POC (<> result, manual entry)     HbA1c, POC (prediabetic range)     HbA1c, POC (controlled diabetic range)       ASSESSMENT &  PLAN: A total of 43 minutes was spent with the patient and counseling/coordination of care regarding preparing for this visit, review of most recent office visit notes, review of chronic medical conditions under management, review of all medications, review of most recent blood work results including interpretation of today's hemoglobin A1c, cardiovascular risks associated with diabetes, education on nutrition, prognosis, documentation, need for follow-up.  Problem List Items Addressed This Visit       Endocrine   Type 2 diabetes mellitus (HCC)   Relevant Medications   glipiZIDE (GLUCOTROL XL) 10 MG 24 hr tablet   Other Relevant Orders   POCT HgB A1C   Comprehensive metabolic panel   Lipid panel   Urine Microalbumin w/creat. ratio   Hyperlipidemia associated with type 2 diabetes mellitus (HCC) - Primary    Improved hemoglobin A1c at 7.5 better than before Eating better and staying physically active Continue weekly Trulicity 0.75 mg, glipizide 10 mg twice a day and Farxiga 10 mg daily Continue rosuvastatin 10 mg daily Cardiovascular risks associated with dyslipidemia and diabetes discussed Diet and nutrition discussed Benefits of exercise discussed Follow-up in 3 months      Relevant Medications   glipiZIDE (GLUCOTROL XL) 10 MG 24 hr tablet     Hematopoietic and Hemostatic   Thrombocytopenia (HCC)    No clinical bleeding episodes CBC done today.      Other Visit Diagnoses     Thrombocytopenia, unspecified (HCC)   (Chronic)     Relevant Orders   CBC with Differential/Platelet        Patient Instructions  Diabetes Mellitus and Nutrition, Adult When you have diabetes, or diabetes mellitus, it is very important to have healthy eating habits because your blood sugar (glucose) levels are greatly affected by what you eat and drink. Eating healthy foods in the right amounts, at about the same times every day, can help you: Manage your blood glucose. Lower your risk  of heart  disease. Improve your blood pressure. Reach or maintain a healthy weight. What can affect my meal plan? Every person with diabetes is different, and each person has different needs for a meal plan. Your health care provider may recommend that you work with a dietitian to make a meal plan that is best for you. Your meal plan may vary depending on factors such as: The calories you need. The medicines you take. Your weight. Your blood glucose, blood pressure, and cholesterol levels. Your activity level. Other health conditions you have, such as heart or kidney disease. How do carbohydrates affect me? Carbohydrates, also called carbs, affect your blood glucose level more than any other type of food. Eating carbs raises the amount of glucose in your blood. It is important to know how many carbs you can safely have in each meal. This is different for every person. Your dietitian can help you calculate how many carbs you should have at each meal and for each snack. How does alcohol affect me? Alcohol can cause a decrease in blood glucose (hypoglycemia), especially if you use insulin or take certain diabetes medicines by mouth. Hypoglycemia can be a life-threatening condition. Symptoms of hypoglycemia, such as sleepiness, dizziness, and confusion, are similar to symptoms of having too much alcohol. Do not drink alcohol if: Your health care provider tells you not to drink. You are pregnant, may be pregnant, or are planning to become pregnant. If you drink alcohol: Limit how much you have to: 0-1 drink a day for women. 0-2 drinks a day for men. Know how much alcohol is in your drink. In the [place] one drink equals one 12 oz bottle of beer (355 mL), one 5 oz glass of wine (148 mL), or one 1 oz glass of hard liquor (44 mL). Keep yourself hydrated with water, diet soda, or unsweetened iced tea. Keep in mind that regular soda, juice, and other mixers may contain a lot of sugar and must be counted as  carbs. What are tips for following this plan?  Reading food labels Start by checking the serving size on the Nutrition Facts label of packaged foods and drinks. The number of calories and the amount of carbs, fats, and other nutrients listed on the label are based on one serving of the item. Many items contain more than one serving per package. Check the total grams (g) of carbs in one serving. Check the number of grams of saturated fats and trans fats in one serving. Choose foods that have a low amount or none of these fats. Check the number of milligrams (mg) of salt (sodium) in one serving. Most people should limit total sodium intake to less than 2,300 mg per day. Always check the nutrition information of foods labeled as "low-fat" or "nonfat." These foods may be higher in added sugar or refined carbs and should be avoided. Talk to your dietitian to identify your daily goals for nutrients listed on the label. Shopping Avoid buying canned, pre-made, or processed foods. These foods tend to be high in fat, sodium, and added sugar. Shop around the outside edge of the grocery store. This is where you will most often find fresh fruits and vegetables, bulk grains, fresh meats, and fresh dairy products. Cooking Use low-heat cooking methods, such as baking, instead of high-heat cooking methods, such as deep frying. Cook using healthy oils, such as olive, canola, or sunflower oil. Avoid cooking with butter, cream, or high-fat meats. Meal planning Eat meals and snacks regularly,  preferably at the same times every day. Avoid going long periods of time without eating. Eat foods that are high in fiber, such as fresh fruits, vegetables, beans, and whole grains. Eat 4-6 oz (112-168 g) of lean protein each day, such as lean meat, chicken, fish, eggs, or tofu. One ounce (oz) (28 g) of lean protein is equal to: 1 oz (28 g) of meat, chicken, or fish. 1 egg.  cup (62 g) of tofu. Eat some foods each day that  contain healthy fats, such as avocado, nuts, seeds, and fish. What foods should I eat? Fruits Berries. Apples. Oranges. Peaches. Apricots. Plums. Grapes. Mangoes. Papayas. Pomegranates. Kiwi. Cherries. Vegetables Leafy greens, including lettuce, spinach, kale, chard, collard greens, mustard greens, and cabbage. Beets. Cauliflower. Broccoli. Carrots. Green beans. Tomatoes. Peppers. Onions. Cucumbers. Brussels sprouts. Grains Whole grains, such as whole-wheat or whole-grain bread, crackers, tortillas, cereal, and pasta. Unsweetened oatmeal. Quinoa. Brown or wild rice. Meats and other proteins Seafood. Poultry without skin. Lean cuts of poultry and beef. Tofu. Nuts. Seeds. Dairy Low-fat or fat-free dairy products such as milk, yogurt, and cheese. The items listed above may not be a complete list of foods and beverages you can eat and drink. Contact a dietitian for more information. What foods should I avoid? Fruits Fruits canned with syrup. Vegetables Canned vegetables. Frozen vegetables with butter or cream sauce. Grains Refined white flour and flour products such as bread, pasta, snack foods, and cereals. Avoid all processed foods. Meats and other proteins Fatty cuts of meat. Poultry with skin. Breaded or fried meats. Processed meat. Avoid saturated fats. Dairy Full-fat yogurt, cheese, or milk. Beverages Sweetened drinks, such as soda or iced tea. The items listed above may not be a complete list of foods and beverages you should avoid. Contact a dietitian for more information. Questions to ask a health care provider Do I need to meet with a certified diabetes care and education specialist? Do I need to meet with a dietitian? What number can I call if I have questions? When are the best times to check my blood glucose? Where to find more information: American Diabetes Association: diabetes.org Academy of Nutrition and Dietetics: eatright.[employer] of Diabetes and  Digestive and Kidney Diseases: StageSync.si Association of Diabetes Care & Education Specialists: diabeteseducator.org Summary It is important to have healthy eating habits because your blood sugar (glucose) levels are greatly affected by what you eat and drink. It is important to use alcohol carefully. A healthy meal plan will help you manage your blood glucose and lower your risk of heart disease. Your health care provider may recommend that you work with a dietitian to make a meal plan that is best for you. This information is not intended to replace advice given to you by your health care provider. Make sure you discuss any questions you have with your health care provider. Document Revised: 03/09/2020 Document Reviewed: 03/09/2020 Elsevier Patient Education  2023 Elsevier Inc.      Edwina Barth, MD [facility]

## 2023-01-08 NOTE — Patient Instructions (Signed)

## 2023-01-08 NOTE — Addendum Note (Signed)
Addended by: Khayman Kirsch J on: 01/08/2023 04:36 PM   Modules accepted: Level of Service  

## 2023-01-08 NOTE — Assessment & Plan Note (Signed)
Improved hemoglobin A1c at 7.5 better than before Eating better and staying physically active Continue weekly Trulicity 0.75 mg, glipizide 10 mg twice a day and Farxiga 10 mg daily Continue rosuvastatin 10 mg daily Cardiovascular risks associated with dyslipidemia and diabetes discussed Diet and nutrition discussed Benefits of exercise discussed Follow-up in 3 months

## 2023-01-08 NOTE — Assessment & Plan Note (Signed)
No clinical bleeding episodes CBC done today.

## 2023-01-11 ENCOUNTER — Telehealth: Payer: Self-pay | Admitting: Emergency Medicine

## 2023-01-11 NOTE — Telephone Encounter (Signed)
Patient can not find the glipizide xl anywhere - he wants to know if he can go back on the glipizide ER.  Please call patient and let him know.  Phone:  [phone]

## 2023-01-13 ENCOUNTER — Other Ambulatory Visit: Payer: Self-pay | Admitting: Emergency Medicine

## 2023-01-13 NOTE — Telephone Encounter (Signed)
New prescription for glipizide ER sent to pharmacy of record today.  Thanks.

## 2023-02-22 ENCOUNTER — Telehealth: Payer: Self-pay | Admitting: Emergency Medicine

## 2023-02-22 NOTE — Telephone Encounter (Signed)
Patient said his dapagliflozin propanediol (FARXIGA) 10 MG TABS tablet is too expensive. He would like to know what Dr. Alvy Bimler would like to do. Patient would like a call back at [phone].

## 2023-02-25 NOTE — Telephone Encounter (Signed)
Agree.  Good job.  Thanks.

## 2023-02-25 NOTE — Telephone Encounter (Signed)
Patient states he called his insurance company and he is in a " donut hole" patient states he is working with [employer] on that. I told patient we have samples to give him until he gets out of the donut hole and be able to get his medication. Patient is coming to the office to get a month worth of samples.

## 2023-02-25 NOTE — Telephone Encounter (Signed)
What equivalents are covered by his insurance?  Thanks.

## 2023-03-26 ENCOUNTER — Ambulatory Visit: Payer: Medicare Other | Admitting: Thoracic Surgery (Cardiothoracic Vascular Surgery)

## 2023-03-26 ENCOUNTER — Encounter: Payer: Self-pay | Admitting: Thoracic Surgery (Cardiothoracic Vascular Surgery)

## 2023-03-26 VITALS — BP 110/73 | HR 74 | Temp 98.9°F | Resp 20 | Wt 172.0 lb

## 2023-03-26 DIAGNOSIS — M25519 Pain in unspecified shoulder: Secondary | ICD-10-CM | POA: Insufficient documentation

## 2023-03-26 DIAGNOSIS — M25512 Pain in left shoulder: Secondary | ICD-10-CM | POA: Diagnosis not present

## 2023-03-26 NOTE — Progress Notes (Signed)
[address]       [name] [address]             [phone]     HPI: David Strong returns with some drainage from his wound last week.  David Strong is a 67 year old man who had pneumonia, bacteremia, and a left sternoclavicular joint abscess in the spring 2023.  He required I&D and VAC placement for his abscess.  He had a prolonged course of antibiotics.  He developed some granulation tissue and required some cauterization but by December 2023 his wound had essentially healed.  There is still been scab over the very lateral aspect of the incision.  Last Friday he noted a small amount of purulent drainage from that area.  He applied some alcohol.  He has not noted any further drainage since then.  Past Medical History:  Diagnosis Date   Diabetes mellitus without complication (HCC)    Pneumonia    Sternoclavicular joint subluxation, left, initial encounter    In setting of joint abscess    Current Outpatient Medications  Medication Sig Dispense Refill   ACCU-CHEK AVIVA PLUS test strip USE TO TEST BLOOD GLUCOSE DAILY 100 strip 3   acetaminophen (TYLENOL) 325 MG tablet Take 2 tablets (650 mg total) by mouth every 6 (six) hours as needed for mild pain, fever or headache (or Fever >/= 101).     dapagliflozin propanediol (FARXIGA) 10 MG TABS tablet Take 1 tablet (10 mg total) by mouth daily. 90 tablet 1   Dulaglutide (TRULICITY) 0.75 MG/0.5ML SOPN Inject 0.75 mg into the skin once a week. 6 mL 3   Insulin Pen Needle (PEN NEEDLES 3/16") 31G X 5 MM MISC 14 Units by Does not apply route 2 (two) times daily with a meal. 50 each 3   Plecanatide (TRULANCE) 3 MG TABS Take 1 tablet (3 mg total) by mouth daily. 30 tablet 3   polyethylene glycol (MIRALAX / GLYCOLAX) 17 g packet Take 17 g by mouth daily as needed. 14 each 0   rosuvastatin (CRESTOR) 10 MG tablet Take 1 tablet (10 mg total) by mouth daily. 90 tablet 3   No current facility-administered medications for this  visit.    Physical Exam BP 110/73 (BP Location: Left Arm, Patient Position: Sitting, Cuff Size: Normal)   Pulse 74   Temp 98.9 F (37.2 C)   Resp 20   Wt 172 lb (78 kg)   SpO2 100% Comment: RA  BMI 29.70 kg/m  67 year old man in no acute distress Wound healed with small 2 to 3 mm eschar on the lateral aspect of wound.  No erythema, induration, or drainage.   Bony deformity left supraclavicular joint Good range of motion left shoulder  Impression: David Strong is a 67 year old man who had pneumonia, bacteremia, and a left sternoclavicular joint abscess in the spring 2023.  He required I&D and VAC placement for his abscess.  He still has a small eschar on the lateral aspect of the wound.  He noticed a little bit of purulent drainage at that site.  He applied alcohol topically.  On exam today the eschar still present.  He refused me taking that off.  There is no drainage.  There is no tenderness in that area, induration, fluctuance, or erythema.  I suspect this is chronic granulation tissue with exudate.  No indication for antibiotics currently.  I asked him to come back and see [place] if this were to recur.  Plan: Return  if any additional issues with wound.  [name], MD [facility] [phone]

## 2023-03-27 ENCOUNTER — Telehealth: Payer: Self-pay | Admitting: Emergency Medicine

## 2023-03-27 NOTE — Telephone Encounter (Signed)
Called patient and informed him of medications that were safe to take

## 2023-03-27 NOTE — Telephone Encounter (Signed)
Patient called and said he has a cold. He wanted to make sure the OTC medication he got is safe to take with his diabetes medication. Patient would like a call back at [phone].

## 2023-04-10 ENCOUNTER — Encounter: Payer: Self-pay | Admitting: Emergency Medicine

## 2023-04-10 ENCOUNTER — Ambulatory Visit: Payer: Medicare Other | Admitting: Emergency Medicine

## 2023-04-10 VITALS — BP 120/86 | HR 71 | Temp 98.2°F | Ht 64.0 in | Wt 176.6 lb

## 2023-04-10 DIAGNOSIS — E785 Hyperlipidemia, unspecified: Secondary | ICD-10-CM | POA: Diagnosis not present

## 2023-04-10 DIAGNOSIS — Z7985 Long-term (current) use of injectable non-insulin antidiabetic drugs: Secondary | ICD-10-CM | POA: Diagnosis not present

## 2023-04-10 DIAGNOSIS — E1169 Type 2 diabetes mellitus with other specified complication: Secondary | ICD-10-CM

## 2023-04-10 DIAGNOSIS — Z7984 Long term (current) use of oral hypoglycemic drugs: Secondary | ICD-10-CM

## 2023-04-10 LAB — POCT GLYCOSYLATED HEMOGLOBIN (HGB A1C): Hemoglobin A1C: 7.5 % — AB (ref 4.0–5.6)

## 2023-04-10 NOTE — Patient Instructions (Signed)
Continue Farxiga 10 mg daily Increase Trulicity to 1.5 mg weekly Follow-up in 3 months  Diabetes Mellitus and Nutrition, Adult When you have diabetes, or diabetes mellitus, it is very important to have healthy eating habits because your blood sugar (glucose) levels are greatly affected by what you eat and drink. Eating healthy foods in the right amounts, at about the same times every day, can help you: Manage your blood glucose. Lower your risk of heart disease. Improve your blood pressure. Reach or maintain a healthy weight. What can affect my meal plan? Every person with diabetes is different, and each person has different needs for a meal plan. Your health care provider may recommend that you work with a dietitian to make a meal plan that is best for you. Your meal plan may vary depending on factors such as: The calories you need. The medicines you take. Your weight. Your blood glucose, blood pressure, and cholesterol levels. Your activity level. Other health conditions you have, such as heart or kidney disease. How do carbohydrates affect me? Carbohydrates, also called carbs, affect your blood glucose level more than any other type of food. Eating carbs raises the amount of glucose in your blood. It is important to know how many carbs you can safely have in each meal. This is different for every person. Your dietitian can help you calculate how many carbs you should have at each meal and for each snack. How does alcohol affect me? Alcohol can cause a decrease in blood glucose (hypoglycemia), especially if you use insulin or take certain diabetes medicines by mouth. Hypoglycemia can be a life-threatening condition. Symptoms of hypoglycemia, such as sleepiness, dizziness, and confusion, are similar to symptoms of having too much alcohol. Do not drink alcohol if: Your health care provider tells you not to drink. You are pregnant, may be pregnant, or are planning to become pregnant. If you  drink alcohol: Limit how much you have to: 0-1 drink a day for women. 0-2 drinks a day for men. Know how much alcohol is in your drink. In the [place] one drink equals one 12 oz bottle of beer (355 mL), one 5 oz glass of wine (148 mL), or one 1 oz glass of hard liquor (44 mL). Keep yourself hydrated with water, diet soda, or unsweetened iced tea. Keep in mind that regular soda, juice, and other mixers may contain a lot of sugar and must be counted as carbs. What are tips for following this plan?  Reading food labels Start by checking the serving size on the Nutrition Facts label of packaged foods and drinks. The number of calories and the amount of carbs, fats, and other nutrients listed on the label are based on one serving of the item. Many items contain more than one serving per package. Check the total grams (g) of carbs in one serving. Check the number of grams of saturated fats and trans fats in one serving. Choose foods that have a low amount or none of these fats. Check the number of milligrams (mg) of salt (sodium) in one serving. Most people should limit total sodium intake to less than 2,300 mg per day. Always check the nutrition information of foods labeled as "low-fat" or "nonfat." These foods may be higher in added sugar or refined carbs and should be avoided. Talk to your dietitian to identify your daily goals for nutrients listed on the label. Shopping Avoid buying canned, pre-made, or processed foods. These foods tend to be high in fat, sodium,  and added sugar. Shop around the outside edge of the grocery store. This is where you will most often find fresh fruits and vegetables, bulk grains, fresh meats, and fresh dairy products. Cooking Use low-heat cooking methods, such as baking, instead of high-heat cooking methods, such as deep frying. Cook using healthy oils, such as olive, canola, or sunflower oil. Avoid cooking with butter, cream, or high-fat meats. Meal planning Eat  meals and snacks regularly, preferably at the same times every day. Avoid going long periods of time without eating. Eat foods that are high in fiber, such as fresh fruits, vegetables, beans, and whole grains. Eat 4-6 oz (112-168 g) of lean protein each day, such as lean meat, chicken, fish, eggs, or tofu. One ounce (oz) (28 g) of lean protein is equal to: 1 oz (28 g) of meat, chicken, or fish. 1 egg.  cup (62 g) of tofu. Eat some foods each day that contain healthy fats, such as avocado, nuts, seeds, and fish. What foods should I eat? Fruits Berries. Apples. Oranges. Peaches. Apricots. Plums. Grapes. Mangoes. Papayas. Pomegranates. Kiwi. Cherries. Vegetables Leafy greens, including lettuce, spinach, kale, chard, collard greens, mustard greens, and cabbage. Beets. Cauliflower. Broccoli. Carrots. Green beans. Tomatoes. Peppers. Onions. Cucumbers. Brussels sprouts. Grains Whole grains, such as whole-wheat or whole-grain bread, crackers, tortillas, cereal, and pasta. Unsweetened oatmeal. Quinoa. Brown or wild rice. Meats and other proteins Seafood. Poultry without skin. Lean cuts of poultry and beef. Tofu. Nuts. Seeds. Dairy Low-fat or fat-free dairy products such as milk, yogurt, and cheese. The items listed above may not be a complete list of foods and beverages you can eat and drink. Contact a dietitian for more information. What foods should I avoid? Fruits Fruits canned with syrup. Vegetables Canned vegetables. Frozen vegetables with butter or cream sauce. Grains Refined white flour and flour products such as bread, pasta, snack foods, and cereals. Avoid all processed foods. Meats and other proteins Fatty cuts of meat. Poultry with skin. Breaded or fried meats. Processed meat. Avoid saturated fats. Dairy Full-fat yogurt, cheese, or milk. Beverages Sweetened drinks, such as soda or iced tea. The items listed above may not be a complete list of foods and beverages you should avoid.  Contact a dietitian for more information. Questions to ask a health care provider Do I need to meet with a certified diabetes care and education specialist? Do I need to meet with a dietitian? What number can I call if I have questions? When are the best times to check my blood glucose? Where to find more information: American Diabetes Association: diabetes.org Academy of Nutrition and Dietetics: eatright.[employer] of Diabetes and Digestive and Kidney Diseases: StageSync.si Association of Diabetes Care & Education Specialists: diabeteseducator.org Summary It is important to have healthy eating habits because your blood sugar (glucose) levels are greatly affected by what you eat and drink. It is important to use alcohol carefully. A healthy meal plan will help you manage your blood glucose and lower your risk of heart disease. Your health care provider may recommend that you work with a dietitian to make a meal plan that is best for you. This information is not intended to replace advice given to you by your health care provider. Make sure you discuss any questions you have with your health care provider. Document Revised: 03/09/2020 Document Reviewed: 03/09/2020 Elsevier Patient Education  2024 [employer].

## 2023-04-10 NOTE — Assessment & Plan Note (Signed)
Hemoglobin A1c at 7.5, still not at goal. Cardiovascular risks associated with uncontrolled diabetes discussed Diet and nutrition discussed Recommend to increase Trulicity to 1.5 mg weekly and continue daily Farxiga 10 mg Continue Crestor 10 mg daily

## 2023-04-10 NOTE — Progress Notes (Signed)
David Strong 67 y.o.   Chief Complaint  Patient presents with   Medical Management of Chronic Issues    HISTORY OF PRESENT ILLNESS: This is a 67 y.o. male here for follow-up of the diabetes and dyslipidemia. Overall doing well. Morning blood sugars between 120 and 135. Has no complaints or any other medical concerns today.  HPI   Prior to Admission medications   Medication Sig Start Date End Date Taking? Authorizing Provider  ACCU-CHEK AVIVA PLUS test strip USE TO TEST BLOOD GLUCOSE DAILY 11/20/22  Yes Kirk Sampley, Eilleen Kempf, MD  acetaminophen (TYLENOL) 325 MG tablet Take 2 tablets (650 mg total) by mouth every 6 (six) hours as needed for mild pain, fever or headache (or Fever >/= 101). 12/02/21  Yes Meredeth Ide, MD  dapagliflozin propanediol (FARXIGA) 10 MG TABS tablet Take 1 tablet (10 mg total) by mouth daily. 11/02/22  Yes Danyia Borunda, Eilleen Kempf, MD  Dulaglutide (TRULICITY) 0.75 MG/0.5ML SOPN Inject 0.75 mg into the skin once a week. 07/04/22  Yes Melinda Pottinger, Eilleen Kempf, MD  Insulin Pen Needle (PEN NEEDLES 3/16") 31G X 5 MM MISC 14 Units by Does not apply route 2 (two) times daily with a meal. 12/02/21  Yes Lama, Sarina Ill, MD  Plecanatide (TRULANCE) 3 MG TABS Take 1 tablet (3 mg total) by mouth daily. 10/10/22  Yes Key Cen, Eilleen Kempf, MD  polyethylene glycol (MIRALAX / GLYCOLAX) 17 g packet Take 17 g by mouth daily as needed. 12/02/21  Yes Meredeth Ide, MD  rosuvastatin (CRESTOR) 10 MG tablet Take 1 tablet (10 mg total) by mouth daily. 03/29/22  Yes [name], MD    Allergies  Allergen Reactions   Codeine Itching   Glipizide Er [Glipizide]     SOB, malaise. He tolerates XL without issues.   Metformin And Related Rash    Patient Active Problem List   Diagnosis Date Noted   Pain in sternoclavicular joint 03/26/2023   Hyperlipidemia associated with type 2 diabetes mellitus (HCC) 01/08/2023   Chronic idiopathic constipation 10/10/2022   Chronic pain of left knee  03/29/2022   Right inguinal hernia 10/12/2021   Type 2 diabetes mellitus (HCC) 06/07/2015   Thrombocytopenia (HCC) 08/03/2012    Past Medical History:  Diagnosis Date   Diabetes mellitus without complication (HCC)    Pneumonia    Sternoclavicular joint subluxation, left, initial encounter    In setting of joint abscess    Past Surgical History:  Procedure Laterality Date   APPLICATION OF WOUND VAC Left 12/07/2021   Procedure: WOUND VAC CHANGE;  Surgeon: [name], MD;  Location: [facility] OR;  Service: Vascular;  Laterality: Left;   INCISION AND DRAINAGE OF WOUND Left 12/04/2021   Procedure: IRRIGATION AND DEBRIDMENT OF STERNOCLAVICULAR WOUND, WOUND VAC PLACMENT.;  Surgeon: [name], MD;  Location: [facility] OR;  Service: Vascular;  Laterality: Left;   IRRIGATION AND DEBRIDEMENT ABSCESS Left 12/07/2021   Procedure: IRRIGATION AND DEBRIDEMENT ABSCESS;  Surgeon: [name], MD;  Location: [facility] OR;  Service: Vascular;  Laterality: Left;   TEE WITHOUT CARDIOVERSION N/A 12/08/2021   Procedure: TRANSESOPHAGEAL ECHOCARDIOGRAM (TEE);  Surgeon: Christell Constant, MD;  Location: [facility] ENDOSCOPY;  Service: Cardiovascular;  Laterality: N/A;    Social History   Socioeconomic History   Marital status: Married    Spouse name: [name]   Number of children: 3   Years of education: college   Highest education level: Not on file  Occupational History   Not on file  Tobacco Use   Smoking status: Never   Smokeless tobacco: Never  Vaping Use   Vaping status: Never Used  Substance and Sexual Activity   Alcohol use: Not Currently    Comment: occasional   Drug use: No   Sexual activity: Yes  Other Topics Concern   Not on file  Social History Narrative   Not on file   Social Determinants of Health   Financial Resource Strain: Low Risk  (06/28/2022)   Overall Financial Resource Strain (CARDIA)    Difficulty of Paying Living Expenses: Not hard at all  Food Insecurity: No  Food Insecurity (06/28/2022)   Hunger Vital Sign    Worried About Running Out of Food in the Last Year: Never true    Ran Out of Food in the Last Year: Never true  Transportation Needs: No Transportation Needs (06/28/2022)   PRAPARE - Administrator, Civil Service (Medical): No    Lack of Transportation (Non-Medical): No  Physical Activity: Insufficiently Active (06/28/2022)   Exercise Vital Sign    Days of Exercise per Week: 2 days    Minutes of Exercise per Session: 60 min  Stress: No Stress Concern Present (06/28/2022)   [employer] of Occupational Health - Occupational Stress Questionnaire    Feeling of Stress : Not at all  Social Connections: Socially Integrated (06/28/2022)   Social Connection and Isolation Panel [NHANES]    Frequency of Communication with Friends and Family: More than three times a week    Frequency of Social Gatherings with Friends and Family: More than three times a week    Attends Religious Services: 1 to 4 times per year    Active Member of [employer] or Organizations: Yes    Attends Engineer, structural: More than 4 times per year    Marital Status: Married  Catering manager Violence: Not At Risk (06/28/2022)   Humiliation, Afraid, Rape, and Kick questionnaire    Fear of Current or Ex-Partner: No    Emotionally Abused: No    Physically Abused: No    Sexually Abused: No    Family History  Adopted: Yes     Review of Systems  Constitutional: Negative.  Negative for chills and fever.  HENT: Negative.  Negative for congestion and sore throat.   Respiratory: Negative.  Negative for cough and shortness of breath.   Cardiovascular: Negative.  Negative for chest pain and palpitations.  Gastrointestinal:  Negative for abdominal pain, diarrhea, nausea and vomiting.  Genitourinary: Negative.  Negative for dysuria.  Skin: Negative.  Negative for rash.  Neurological: Negative.  Negative for dizziness and headaches.  All other systems reviewed  and are negative.   Vitals:   04/10/23 1451  BP: 120/86  Pulse: 71  Temp: 98.2 F (36.8 C)  SpO2: 98%    Physical Exam Vitals reviewed.  Constitutional:      Appearance: Normal appearance.  HENT:     Head: Normocephalic.     Mouth/Throat:     Mouth: Mucous membranes are moist.     Pharynx: Oropharynx is clear.  Eyes:     Extraocular Movements: Extraocular movements intact.     Pupils: Pupils are equal, round, and reactive to light.  Cardiovascular:     Rate and Rhythm: Normal rate and regular rhythm.     Pulses: Normal pulses.     Heart sounds: Normal heart sounds.  Pulmonary:     Effort: Pulmonary effort is normal.     Breath sounds: Normal  breath sounds.  Musculoskeletal:     Cervical back: No tenderness.  Lymphadenopathy:     Cervical: No cervical adenopathy.  Skin:    General: Skin is warm and dry.     Capillary Refill: Capillary refill takes less than 2 seconds.  Neurological:     General: No focal deficit present.     Mental Status: He is alert and oriented to person, place, and time.  Psychiatric:        Mood and Affect: Mood normal.        Behavior: Behavior normal.    Results for orders placed or performed in visit on 04/10/23 (from the past 24 hour(s))  POCT A1C     Status: Abnormal   Collection Time: 04/10/23  3:22 PM  Result Value Ref Range   Hemoglobin A1C 7.5 (A) 4.0 - 5.6 %   HbA1c POC (<> result, manual entry)     HbA1c, POC (prediabetic range)     HbA1c, POC (controlled diabetic range)       ASSESSMENT & PLAN: A total of 44 minutes was spent with the patient and counseling/coordination of care regarding preparing for this visit, review of most recent office visit notes, review of chronic medical conditions under management, review of all medications and changes made, cardiovascular risks associated with diabetes and dyslipidemia, review of most recent blood work results including interpretation of today's hemoglobin A1c, education on  nutrition, prognosis, documentation and need for follow-up.  Problem List Items Addressed This Visit       Endocrine   Hyperlipidemia associated with type 2 diabetes mellitus (HCC) - Primary    Hemoglobin A1c at 7.5, still not at goal. Cardiovascular risks associated with uncontrolled diabetes discussed Diet and nutrition discussed Recommend to increase Trulicity to 1.5 mg weekly and continue daily Farxiga 10 mg Continue Crestor 10 mg daily       Relevant Orders   POCT A1C (Completed)   Patient Instructions  Continue Farxiga 10 mg daily Increase Trulicity to 1.5 mg weekly Follow-up in 3 months  Diabetes Mellitus and Nutrition, Adult When you have diabetes, or diabetes mellitus, it is very important to have healthy eating habits because your blood sugar (glucose) levels are greatly affected by what you eat and drink. Eating healthy foods in the right amounts, at about the same times every day, can help you: Manage your blood glucose. Lower your risk of heart disease. Improve your blood pressure. Reach or maintain a healthy weight. What can affect my meal plan? Every person with diabetes is different, and each person has different needs for a meal plan. Your health care provider may recommend that you work with a dietitian to make a meal plan that is best for you. Your meal plan may vary depending on factors such as: The calories you need. The medicines you take. Your weight. Your blood glucose, blood pressure, and cholesterol levels. Your activity level. Other health conditions you have, such as heart or kidney disease. How do carbohydrates affect me? Carbohydrates, also called carbs, affect your blood glucose level more than any other type of food. Eating carbs raises the amount of glucose in your blood. It is important to know how many carbs you can safely have in each meal. This is different for every person. Your dietitian can help you calculate how many carbs you should  have at each meal and for each snack. How does alcohol affect me? Alcohol can cause a decrease in blood glucose (hypoglycemia), especially if you  use insulin or take certain diabetes medicines by mouth. Hypoglycemia can be a life-threatening condition. Symptoms of hypoglycemia, such as sleepiness, dizziness, and confusion, are similar to symptoms of having too much alcohol. Do not drink alcohol if: Your health care provider tells you not to drink. You are pregnant, may be pregnant, or are planning to become pregnant. If you drink alcohol: Limit how much you have to: 0-1 drink a day for women. 0-2 drinks a day for men. Know how much alcohol is in your drink. In the [place] one drink equals one 12 oz bottle of beer (355 mL), one 5 oz glass of wine (148 mL), or one 1 oz glass of hard liquor (44 mL). Keep yourself hydrated with water, diet soda, or unsweetened iced tea. Keep in mind that regular soda, juice, and other mixers may contain a lot of sugar and must be counted as carbs. What are tips for following this plan?  Reading food labels Start by checking the serving size on the Nutrition Facts label of packaged foods and drinks. The number of calories and the amount of carbs, fats, and other nutrients listed on the label are based on one serving of the item. Many items contain more than one serving per package. Check the total grams (g) of carbs in one serving. Check the number of grams of saturated fats and trans fats in one serving. Choose foods that have a low amount or none of these fats. Check the number of milligrams (mg) of salt (sodium) in one serving. Most people should limit total sodium intake to less than 2,300 mg per day. Always check the nutrition information of foods labeled as "low-fat" or "nonfat." These foods may be higher in added sugar or refined carbs and should be avoided. Talk to your dietitian to identify your daily goals for nutrients listed on the label. Shopping Avoid  buying canned, pre-made, or processed foods. These foods tend to be high in fat, sodium, and added sugar. Shop around the outside edge of the grocery store. This is where you will most often find fresh fruits and vegetables, bulk grains, fresh meats, and fresh dairy products. Cooking Use low-heat cooking methods, such as baking, instead of high-heat cooking methods, such as deep frying. Cook using healthy oils, such as olive, canola, or sunflower oil. Avoid cooking with butter, cream, or high-fat meats. Meal planning Eat meals and snacks regularly, preferably at the same times every day. Avoid going long periods of time without eating. Eat foods that are high in fiber, such as fresh fruits, vegetables, beans, and whole grains. Eat 4-6 oz (112-168 g) of lean protein each day, such as lean meat, chicken, fish, eggs, or tofu. One ounce (oz) (28 g) of lean protein is equal to: 1 oz (28 g) of meat, chicken, or fish. 1 egg.  cup (62 g) of tofu. Eat some foods each day that contain healthy fats, such as avocado, nuts, seeds, and fish. What foods should I eat? Fruits Berries. Apples. Oranges. Peaches. Apricots. Plums. Grapes. Mangoes. Papayas. Pomegranates. Kiwi. Cherries. Vegetables Leafy greens, including lettuce, spinach, kale, chard, collard greens, mustard greens, and cabbage. Beets. Cauliflower. Broccoli. Carrots. Green beans. Tomatoes. Peppers. Onions. Cucumbers. Brussels sprouts. Grains Whole grains, such as whole-wheat or whole-grain bread, crackers, tortillas, cereal, and pasta. Unsweetened oatmeal. Quinoa. Brown or wild rice. Meats and other proteins Seafood. Poultry without skin. Lean cuts of poultry and beef. Tofu. Nuts. Seeds. Dairy Low-fat or fat-free dairy products such as milk, yogurt, and cheese.  The items listed above may not be a complete list of foods and beverages you can eat and drink. Contact a dietitian for more information. What foods should I avoid? Fruits Fruits  canned with syrup. Vegetables Canned vegetables. Frozen vegetables with butter or cream sauce. Grains Refined white flour and flour products such as bread, pasta, snack foods, and cereals. Avoid all processed foods. Meats and other proteins Fatty cuts of meat. Poultry with skin. Breaded or fried meats. Processed meat. Avoid saturated fats. Dairy Full-fat yogurt, cheese, or milk. Beverages Sweetened drinks, such as soda or iced tea. The items listed above may not be a complete list of foods and beverages you should avoid. Contact a dietitian for more information. Questions to ask a health care provider Do I need to meet with a certified diabetes care and education specialist? Do I need to meet with a dietitian? What number can I call if I have questions? When are the best times to check my blood glucose? Where to find more information: American Diabetes Association: diabetes.org Academy of Nutrition and Dietetics: eatright.[employer] of Diabetes and Digestive and Kidney Diseases: StageSync.si Association of Diabetes Care & Education Specialists: diabeteseducator.org Summary It is important to have healthy eating habits because your blood sugar (glucose) levels are greatly affected by what you eat and drink. It is important to use alcohol carefully. A healthy meal plan will help you manage your blood glucose and lower your risk of heart disease. Your health care provider may recommend that you work with a dietitian to make a meal plan that is best for you. This information is not intended to replace advice given to you by your health care provider. Make sure you discuss any questions you have with your health care provider. Document Revised: 03/09/2020 Document Reviewed: 03/09/2020 Elsevier Patient Education  2024 Elsevier Inc.      Edwina Barth, MD [facility]

## 2023-04-22 ENCOUNTER — Other Ambulatory Visit: Payer: Self-pay | Admitting: Emergency Medicine

## 2023-04-22 DIAGNOSIS — E1165 Type 2 diabetes mellitus with hyperglycemia: Secondary | ICD-10-CM

## 2023-04-23 ENCOUNTER — Telehealth: Payer: Self-pay | Admitting: Emergency Medicine

## 2023-04-23 DIAGNOSIS — E1165 Type 2 diabetes mellitus with hyperglycemia: Secondary | ICD-10-CM

## 2023-04-23 NOTE — Telephone Encounter (Signed)
Prescription Request  04/23/2023  LOV: 04/10/2023  What is the name of the medication or equipment? Rosuvastatin & trulicity   Have you contacted your pharmacy to request a refill? Yes   Which pharmacy would you like this sent to?  [facility] [phone] [place] [address], [place] - [address] [place] [address] Phone: [phone] Fax: [phone]   Patient notified that their request is being sent to the clinical staff for review and that they should receive a response within 2 business days.   Please advise at Mobile There is no such number on file (mobile).

## 2023-04-24 ENCOUNTER — Other Ambulatory Visit: Payer: Self-pay | Admitting: *Deleted

## 2023-04-24 DIAGNOSIS — E1165 Type 2 diabetes mellitus with hyperglycemia: Secondary | ICD-10-CM

## 2023-04-24 MED ORDER — TRULICITY 0.75 MG/0.5ML ~~LOC~~ SOAJ
0.7500 mg | SUBCUTANEOUS | 3 refills | Status: DC
Start: 2023-04-24 — End: 2023-04-26

## 2023-04-24 MED ORDER — ACCU-CHEK AVIVA PLUS VI STRP: ORAL_STRIP | 3 refills | Status: DC

## 2023-04-24 NOTE — Telephone Encounter (Signed)
Called patient and informed him that the medication was sent to his pharmacy.

## 2023-04-26 ENCOUNTER — Telehealth: Payer: Self-pay | Admitting: Emergency Medicine

## 2023-04-26 DIAGNOSIS — E1165 Type 2 diabetes mellitus with hyperglycemia: Secondary | ICD-10-CM

## 2023-04-26 MED ORDER — ACCU-CHEK AVIVA PLUS VI STRP: ORAL_STRIP | 3 refills | Status: DC

## 2023-04-26 MED ORDER — TRULICITY 1.5 MG/0.5ML ~~LOC~~ SOAJ: 1.5000 mg | SUBCUTANEOUS | 2 refills | Status: DC

## 2023-04-26 NOTE — Telephone Encounter (Signed)
Called patient and informed him that a new prescription has been sent to the pharmacy for the Trulicity.

## 2023-04-26 NOTE — Telephone Encounter (Signed)
Please call patient he is having trouble with his trulicity - wants to check dosage.  Please call [phone]

## 2023-07-11 ENCOUNTER — Encounter: Payer: Self-pay | Admitting: Emergency Medicine

## 2023-07-11 ENCOUNTER — Ambulatory Visit (INDEPENDENT_AMBULATORY_CARE_PROVIDER_SITE_OTHER): Payer: Medicare Other | Admitting: Emergency Medicine

## 2023-07-11 VITALS — BP 112/78 | HR 66 | Temp 98.0°F | Ht 64.0 in | Wt 171.8 lb

## 2023-07-11 DIAGNOSIS — Z7985 Long-term (current) use of injectable non-insulin antidiabetic drugs: Secondary | ICD-10-CM | POA: Diagnosis not present

## 2023-07-11 DIAGNOSIS — Z7984 Long term (current) use of oral hypoglycemic drugs: Secondary | ICD-10-CM | POA: Diagnosis not present

## 2023-07-11 DIAGNOSIS — E785 Hyperlipidemia, unspecified: Secondary | ICD-10-CM

## 2023-07-11 DIAGNOSIS — E1169 Type 2 diabetes mellitus with other specified complication: Secondary | ICD-10-CM

## 2023-07-11 LAB — MICROALBUMIN / CREATININE URINE RATIO
Creatinine,U: 122 mg/dL
Microalb Creat Ratio: 0.6 mg/g (ref 0.0–30.0)
Microalb, Ur: <0.7 mg/dL (ref 0.0–1.9)

## 2023-07-11 LAB — POCT GLYCOSYLATED HEMOGLOBIN (HGB A1C): Hemoglobin A1C: 7.1 % — AB (ref 4.0–5.6)

## 2023-07-11 NOTE — Patient Instructions (Signed)

## 2023-07-11 NOTE — Progress Notes (Signed)
David Strong 67 y.o.   Chief Complaint  Patient presents with   Follow-up    3 month f/u for DM. Patient states he has no concerns just worried about his kidneys and liver. Patient does need a refill Marcelline Deist but concern about price. Patient state only having some stressors.( 70 yr old daughter keeps running away )    HISTORY OF PRESENT ILLNESS: This is a 67 y.o. male A1A here for 30-month follow-up of chronic medical conditions including diabetes and dyslipidemia Overall doing well.  Has no complaints or medical concerns today.  Urine Lab Results  Component Value Date   HGBA1C 7.5 (A) 04/10/2023   Wt Readings from Last 3 Encounters:  07/11/23 171 lb 12.8 oz (77.9 kg)  04/10/23 176 lb 9.6 oz (80.1 kg)  03/26/23 172 lb (78 kg)     HPI   Prior to Admission medications   Medication Sig Start Date End Date Taking? Authorizing Provider  acetaminophen (TYLENOL) 325 MG tablet Take 2 tablets (650 mg total) by mouth every 6 (six) hours as needed for mild pain, fever or headache (or Fever >/= 101). 12/02/21  Yes Meredeth Ide, MD  dapagliflozin propanediol (FARXIGA) 10 MG TABS tablet Take 1 tablet (10 mg total) by mouth daily. 11/02/22  Yes Ghadeer Kastelic, Eilleen Kempf, MD  Dulaglutide (TRULICITY) 1.5 MG/0.5ML SOPN Inject 1.5 mg into the skin once a week. 04/26/23  Yes Terressa Evola, Eilleen Kempf, MD  glucose blood (ACCU-CHEK AVIVA PLUS) test strip Use to test blood glucose daily. 04/26/23  Yes Amilliana Hayworth, Eilleen Kempf, MD  Insulin Pen Needle (PEN NEEDLES 3/16") 31G X 5 MM MISC 14 Units by Does not apply route 2 (two) times daily with a meal. 12/02/21  Yes Lama, Sarina Ill, MD  Plecanatide (TRULANCE) 3 MG TABS Take 1 tablet (3 mg total) by mouth daily. 10/10/22  Yes Aydyn Testerman, Eilleen Kempf, MD  polyethylene glycol (MIRALAX / GLYCOLAX) 17 g packet Take 17 g by mouth daily as needed. 12/02/21  Yes Meredeth Ide, MD  rosuvastatin (CRESTOR) 10 MG tablet TAKE 1 TABLET BY MOUTH EVERY DAY 04/22/23  Yes Lithzy Bernard, Eilleen Kempf, MD     Allergies  Allergen Reactions   Codeine Itching   Glipizide Er [Glipizide]     SOB, malaise. He tolerates XL without issues.   Metformin And Related Rash    Patient Active Problem List   Diagnosis Date Noted   Hyperlipidemia associated with type 2 diabetes mellitus (HCC) 01/08/2023   Chronic idiopathic constipation 10/10/2022   Chronic pain of left knee 03/29/2022   Right inguinal hernia 10/12/2021   Type 2 diabetes mellitus (HCC) 06/07/2015   Thrombocytopenia (HCC) 08/03/2012    Past Medical History:  Diagnosis Date   Diabetes mellitus without complication (HCC)    Pneumonia    Sternoclavicular joint subluxation, left, initial encounter    In setting of joint abscess    Past Surgical History:  Procedure Laterality Date   APPLICATION OF WOUND VAC Left 12/07/2021   Procedure: WOUND VAC CHANGE;  Surgeon: [name], MD;  Location: [facility] OR;  Service: Vascular;  Laterality: Left;   INCISION AND DRAINAGE OF WOUND Left 12/04/2021   Procedure: IRRIGATION AND DEBRIDMENT OF STERNOCLAVICULAR WOUND, WOUND VAC PLACMENT.;  Surgeon: [name], MD;  Location: [facility] OR;  Service: Vascular;  Laterality: Left;   IRRIGATION AND DEBRIDEMENT ABSCESS Left 12/07/2021   Procedure: IRRIGATION AND DEBRIDEMENT ABSCESS;  Surgeon: [name], MD;  Location: [facility] OR;  Service: Vascular;  Laterality:  Left;   TEE WITHOUT CARDIOVERSION N/A 12/08/2021   Procedure: TRANSESOPHAGEAL ECHOCARDIOGRAM (TEE);  Surgeon: Christell Constant, MD;  Location[facility] ENDOSCOPY;  Service: Cardiovascular;  Laterality: N/A;    Social History   Socioeconomic History   Marital status: Married    Spouse name: [name]   Number of children: 3   Years of education: college   Highest education level: Not on file  Occupational History   Not on file  Tobacco Use   Smoking status: Never   Smokeless tobacco: Never  Vaping Use   Vaping status: Never Used  Substance and Sexual Activity   Alcohol use:  Not Currently    Comment: occasional   Drug use: No   Sexual activity: Yes  Other Topics Concern   Not on file  Social History Narrative   Not on file   Social Determinants of Health   Financial Resource Strain: Low Risk  (06/28/2022)   Overall Financial Resource Strain (CARDIA)    Difficulty of Paying Living Expenses: Not hard at all  Food Insecurity: No Food Insecurity (06/28/2022)   Hunger Vital Sign    Worried About Running Out of Food in the Last Year: Never true    Ran Out of Food in the Last Year: Never true  Transportation Needs: No Transportation Needs (06/28/2022)   PRAPARE - Administrator, Civil Service (Medical): No    Lack of Transportation (Non-Medical): No  Physical Activity: Insufficiently Active (06/28/2022)   Exercise Vital Sign    Days of Exercise per Week: 2 days    Minutes of Exercise per Session: 60 min  Stress: No Stress Concern Present (06/28/2022)   [employer] of Occupational Health - Occupational Stress Questionnaire    Feeling of Stress : Not at all  Social Connections: Socially Integrated (06/28/2022)   Social Connection and Isolation Panel [NHANES]    Frequency of Communication with Friends and Family: More than three times a week    Frequency of Social Gatherings with Friends and Family: More than three times a week    Attends Religious Services: 1 to 4 times per year    Active Member of [employer] or Organizations: Yes    Attends Engineer, structural: More than 4 times per year    Marital Status: Married  Catering manager Violence: Not At Risk (06/28/2022)   Humiliation, Afraid, Rape, and Kick questionnaire    Fear of Current or Ex-Partner: No    Emotionally Abused: No    Physically Abused: No    Sexually Abused: No    Family History  Adopted: Yes     Review of Systems  Constitutional: Negative.  Negative for chills and fever.  HENT: Negative.  Negative for congestion and sore throat.   Respiratory: Negative.   Negative for cough and shortness of breath.   Cardiovascular: Negative.  Negative for chest pain and palpitations.  Gastrointestinal:  Negative for abdominal pain, diarrhea, nausea and vomiting.  Genitourinary: Negative.  Negative for dysuria and hematuria.  Skin: Negative.  Negative for rash.  Neurological: Negative.  Negative for dizziness and headaches.  All other systems reviewed and are negative.   Vitals:   07/11/23 1507  BP: 112/78  Pulse: 66  Temp: 98 F (36.7 C)  SpO2: 98%    Physical Exam Vitals reviewed.  Constitutional:      Appearance: Normal appearance.  HENT:     Head: Normocephalic.  Eyes:     Extraocular Movements: Extraocular movements intact.  Cardiovascular:  Rate and Rhythm: Normal rate and regular rhythm.     Pulses: Normal pulses.     Heart sounds: Normal heart sounds.  Pulmonary:     Effort: Pulmonary effort is normal.     Breath sounds: Normal breath sounds.  Abdominal:     Palpations: Abdomen is soft.     Tenderness: There is no abdominal tenderness.  Musculoskeletal:     Cervical back: No tenderness.  Lymphadenopathy:     Cervical: No cervical adenopathy.  Skin:    General: Skin is warm and dry.     Capillary Refill: Capillary refill takes less than 2 seconds.  Neurological:     General: No focal deficit present.     Mental Status: He is alert and oriented to person, place, and time.  Psychiatric:        Mood and Affect: Mood normal.        Behavior: Behavior normal.    Results for orders placed or performed in visit on 07/11/23 (from the past 24 hour(s))  POCT HgB A1C     Status: Abnormal   Collection Time: 07/11/23  4:51 PM  Result Value Ref Range   Hemoglobin A1C 7.1 (A) 4.0 - 5.6 %   HbA1c POC (<> result, manual entry)     HbA1c, POC (prediabetic range)     HbA1c, POC (controlled diabetic range)       ASSESSMENT & PLAN: A total of 43 minutes was spent with the patient and counseling/coordination of care regarding  preparing for this visit, review of most recent office visit notes, review of multiple chronic medical conditions under management, review of most recent blood work results including interpretation of today's hemoglobin A1c, review of all medications, cardiovascular risks associated with diabetes and dyslipidemia, education on nutrition, prognosis, documentation, and need for follow-up  Problem List Items Addressed This Visit       Endocrine   Hyperlipidemia associated with type 2 diabetes mellitus (HCC) - Primary    Improved diabetes with hemoglobin A1c of 7.1, better than before Eating better and staying physically active Cardiovascular risks associated with dyslipidemia and diabetes discussed Diet and nutrition discussed Blood work done today Follow-up in 6 months Continue weekly Trulicity 1.5 mg, rosuvastatin 10 mg daily and Farxiga 10 mg daily      Relevant Orders   POCT HgB A1C   Urine Microalbumin w/creat. ratio   Comprehensive metabolic panel   Lipid panel   Patient Instructions  Diabetes Mellitus and Nutrition, Adult When you have diabetes, or diabetes mellitus, it is very important to have healthy eating habits because your blood sugar (glucose) levels are greatly affected by what you eat and drink. Eating healthy foods in the right amounts, at about the same times every day, can help you: Manage your blood glucose. Lower your risk of heart disease. Improve your blood pressure. Reach or maintain a healthy weight. What can affect my meal plan? Every person with diabetes is different, and each person has different needs for a meal plan. Your health care provider may recommend that you work with a dietitian to make a meal plan that is best for you. Your meal plan may vary depending on factors such as: The calories you need. The medicines you take. Your weight. Your blood glucose, blood pressure, and cholesterol levels. Your activity level. Other health conditions you have,  such as heart or kidney disease. How do carbohydrates affect me? Carbohydrates, also called carbs, affect your blood glucose level more than any  other type of food. Eating carbs raises the amount of glucose in your blood. It is important to know how many carbs you can safely have in each meal. This is different for every person. Your dietitian can help you calculate how many carbs you should have at each meal and for each snack. How does alcohol affect me? Alcohol can cause a decrease in blood glucose (hypoglycemia), especially if you use insulin or take certain diabetes medicines by mouth. Hypoglycemia can be a life-threatening condition. Symptoms of hypoglycemia, such as sleepiness, dizziness, and confusion, are similar to symptoms of having too much alcohol. Do not drink alcohol if: Your health care provider tells you not to drink. You are pregnant, may be pregnant, or are planning to become pregnant. If you drink alcohol: Limit how much you have to: 0-1 drink a day for women. 0-2 drinks a day for men. Know how much alcohol is in your drink. In the [place] one drink equals one 12 oz bottle of beer (355 mL), one 5 oz glass of wine (148 mL), or one 1 oz glass of hard liquor (44 mL). Keep yourself hydrated with water, diet soda, or unsweetened iced tea. Keep in mind that regular soda, juice, and other mixers may contain a lot of sugar and must be counted as carbs. What are tips for following this plan?  Reading food labels Start by checking the serving size on the Nutrition Facts label of packaged foods and drinks. The number of calories and the amount of carbs, fats, and other nutrients listed on the label are based on one serving of the item. Many items contain more than one serving per package. Check the total grams (g) of carbs in one serving. Check the number of grams of saturated fats and trans fats in one serving. Choose foods that have a low amount or none of these fats. Check the number  of milligrams (mg) of salt (sodium) in one serving. Most people should limit total sodium intake to less than 2,300 mg per day. Always check the nutrition information of foods labeled as "low-fat" or "nonfat." These foods may be higher in added sugar or refined carbs and should be avoided. Talk to your dietitian to identify your daily goals for nutrients listed on the label. Shopping Avoid buying canned, pre-made, or processed foods. These foods tend to be high in fat, sodium, and added sugar. Shop around the outside edge of the grocery store. This is where you will most often find fresh fruits and vegetables, bulk grains, fresh meats, and fresh dairy products. Cooking Use low-heat cooking methods, such as baking, instead of high-heat cooking methods, such as deep frying. Cook using healthy oils, such as olive, canola, or sunflower oil. Avoid cooking with butter, cream, or high-fat meats. Meal planning Eat meals and snacks regularly, preferably at the same times every day. Avoid going long periods of time without eating. Eat foods that are high in fiber, such as fresh fruits, vegetables, beans, and whole grains. Eat 4-6 oz (112-168 g) of lean protein each day, such as lean meat, chicken, fish, eggs, or tofu. One ounce (oz) (28 g) of lean protein is equal to: 1 oz (28 g) of meat, chicken, or fish. 1 egg.  cup (62 g) of tofu. Eat some foods each day that contain healthy fats, such as avocado, nuts, seeds, and fish. What foods should I eat? Fruits Berries. Apples. Oranges. Peaches. Apricots. Plums. Grapes. Mangoes. Papayas. Pomegranates. Kiwi. Cherries. Vegetables Leafy greens, including lettuce,  spinach, kale, chard, collard greens, mustard greens, and cabbage. Beets. Cauliflower. Broccoli. Carrots. Green beans. Tomatoes. Peppers. Onions. Cucumbers. Brussels sprouts. Grains Whole grains, such as whole-wheat or whole-grain bread, crackers, tortillas, cereal, and pasta. Unsweetened oatmeal.  Quinoa. Brown or wild rice. Meats and other proteins Seafood. Poultry without skin. Lean cuts of poultry and beef. Tofu. Nuts. Seeds. Dairy Low-fat or fat-free dairy products such as milk, yogurt, and cheese. The items listed above may not be a complete list of foods and beverages you can eat and drink. Contact a dietitian for more information. What foods should I avoid? Fruits Fruits canned with syrup. Vegetables Canned vegetables. Frozen vegetables with butter or cream sauce. Grains Refined white flour and flour products such as bread, pasta, snack foods, and cereals. Avoid all processed foods. Meats and other proteins Fatty cuts of meat. Poultry with skin. Breaded or fried meats. Processed meat. Avoid saturated fats. Dairy Full-fat yogurt, cheese, or milk. Beverages Sweetened drinks, such as soda or iced tea. The items listed above may not be a complete list of foods and beverages you should avoid. Contact a dietitian for more information. Questions to ask a health care provider Do I need to meet with a certified diabetes care and education specialist? Do I need to meet with a dietitian? What number can I call if I have questions? When are the best times to check my blood glucose? Where to find more information: American Diabetes Association: diabetes.org Academy of Nutrition and Dietetics: eatright.[employer] of Diabetes and Digestive and Kidney Diseases: StageSync.si Association of Diabetes Care & Education Specialists: diabeteseducator.org Summary It is important to have healthy eating habits because your blood sugar (glucose) levels are greatly affected by what you eat and drink. It is important to use alcohol carefully. A healthy meal plan will help you manage your blood glucose and lower your risk of heart disease. Your health care provider may recommend that you work with a dietitian to make a meal plan that is best for you. This information is not intended to  replace advice given to you by your health care provider. Make sure you discuss any questions you have with your health care provider. Document Revised: 03/09/2020 Document Reviewed: 03/09/2020 Elsevier Patient Education  2024 Elsevier Inc.      Edwina Barth, MD [facility]

## 2023-07-11 NOTE — Assessment & Plan Note (Signed)
Improved diabetes with hemoglobin A1c of 7.1, better than before Eating better and staying physically active Cardiovascular risks associated with dyslipidemia and diabetes discussed Diet and nutrition discussed Blood work done today Follow-up in 6 months Continue weekly Trulicity 1.5 mg, rosuvastatin 10 mg daily and Farxiga 10 mg daily

## 2023-07-12 ENCOUNTER — Telehealth: Payer: Self-pay | Admitting: Emergency Medicine

## 2023-07-12 LAB — LIPID PANEL
Cholesterol: 115 mg/dL (ref 0–200)
HDL: 41.4 mg/dL (ref >39.00)
LDL Cholesterol: 60 mg/dL (ref 0–99)
NonHDL: 74.03
Total CHOL/HDL Ratio: 3
Triglycerides: 69 mg/dL (ref 0.0–149.0)
VLDL: 13.8 mg/dL (ref 0.0–40.0)

## 2023-07-12 LAB — COMPREHENSIVE METABOLIC PANEL
ALT: 19 U/L (ref 0–53)
AST: 20 U/L (ref 0–37)
Albumin: 4.8 g/dL (ref 3.5–5.2)
Alkaline Phosphatase: 52 U/L (ref 39–117)
BUN: 17 mg/dL (ref 6–23)
CO2: 30 meq/L (ref 19–32)
Calcium: 9.9 mg/dL (ref 8.4–10.5)
Chloride: 104 meq/L (ref 96–112)
Creatinine, Ser: 1.15 mg/dL (ref 0.40–1.50)
GFR: 66.07 mL/min (ref >60.00)
Glucose, Bld: 122 mg/dL — ABNORMAL HIGH (ref 70–99)
Potassium: 4.5 meq/L (ref 3.5–5.1)
Sodium: 143 meq/L (ref 135–145)
Total Bilirubin: 0.7 mg/dL (ref 0.2–1.2)
Total Protein: 7.1 g/dL (ref 6.0–8.3)

## 2023-07-12 NOTE — Telephone Encounter (Signed)
Prescription Request  07/12/2023  LOV: 07/11/2023 Pt is completely out of his medication  What is the name of the medication or equipment? dapagliflozin propanediol (FARXIGA) 10 MG TABS tablet   Have you contacted your pharmacy to request a refill? No   Which pharmacy would you like this sent to?    [id number] [place], [place] - [address] [place] [address] Phone: [phone] Fax: [phone]  Patient notified that their request is being sent to the clinical staff for review and that they should receive a response within 2 business days.   Please advise at Mobile There is no such number on file (mobile).

## 2023-07-15 ENCOUNTER — Other Ambulatory Visit: Payer: Self-pay | Admitting: Emergency Medicine

## 2023-07-15 MED ORDER — DAPAGLIFLOZIN PROPANEDIOL 10 MG PO TABS: 10.0000 mg | ORAL_TABLET | Freq: Every day | ORAL | 3 refills | Status: DC

## 2023-08-05 ENCOUNTER — Telehealth: Payer: Self-pay | Admitting: Emergency Medicine

## 2023-08-05 NOTE — Telephone Encounter (Signed)
Patient would like his last lab results mailed to him.

## 2023-08-05 NOTE — Telephone Encounter (Signed)
Labs printed and will be mailed as requested

## 2023-09-13 ENCOUNTER — Ambulatory Visit (HOSPITAL_COMMUNITY)
Admission: EM | Admit: 2023-09-13 | Discharge: 2023-09-13 | Disposition: A | Discharge status: Home / Self Care | Payer: Medicare Other | Attending: Family Medicine | Admitting: Family Medicine

## 2023-09-13 ENCOUNTER — Encounter (HOSPITAL_COMMUNITY): Payer: Self-pay | Admitting: Emergency Medicine

## 2023-09-13 ENCOUNTER — Ambulatory Visit: Payer: Self-pay | Admitting: Emergency Medicine

## 2023-09-13 DIAGNOSIS — J069 Acute upper respiratory infection, unspecified: Secondary | ICD-10-CM | POA: Insufficient documentation

## 2023-09-13 LAB — POCT RAPID STREP A (OFFICE): Rapid Strep A Screen: NEGATIVE

## 2023-09-13 MED ORDER — BENZONATATE 200 MG PO CAPS: 200.0000 mg | ORAL_CAPSULE | Freq: Three times a day (TID) | ORAL | 0 refills | Status: DC | PRN

## 2023-09-13 NOTE — ED Triage Notes (Signed)
Patient presents with nasal congestion, sore throat and productive cough x 2 days. Patient states he took OTC Tylenol sinus yesterday.

## 2023-09-13 NOTE — Discharge Instructions (Signed)
You were seen today for upper respiratory symptoms.  Your strep test was negative today.  This will be sent to the lab for further testing and you will be notified if positive.  In the mean time, your symptoms appear viral at this time.  I see no evidence of bacterial infection.  I have sent out a medication to help with the cough.  I recommend you take over the counter claritin or zyrtec and flonase to help with sinus congestion, drainage and sneezing.  Please return or follow up with your primary care provider if you are not improving.

## 2023-09-13 NOTE — ED Provider Notes (Signed)
[facility]    CSN: [id number] Arrival date & time: 09/13/23  1238      History   Chief Complaint Chief Complaint  Patient presents with   Nasal Congestion   Cough   Sore Throat    HPI David Strong is a 68 y.o. male.    Cough Associated symptoms: sore throat   Sore Throat  Patient is here for URI symptoms.  He was exposed to his son who had a cold last week.   He is having a sore throat, dry cough, sneezing.  No fevers/chills.   No sinus/pain or pressure.  He is eating/drinking normally.  No wheezing or sob.  He is using otc medications with some help.        Past Medical History:  Diagnosis Date   Diabetes mellitus without complication (HCC)    Pneumonia    Sternoclavicular joint subluxation, left, initial encounter    In setting of joint abscess    Patient Active Problem List   Diagnosis Date Noted   Hyperlipidemia associated with type 2 diabetes mellitus (HCC) 01/08/2023   Chronic idiopathic constipation 10/10/2022   Chronic pain of left knee 03/29/2022   Right inguinal hernia 10/12/2021   Type 2 diabetes mellitus (HCC) 06/07/2015   Thrombocytopenia (HCC) 08/03/2012    Past Surgical History:  Procedure Laterality Date   APPLICATION OF WOUND VAC Left 12/07/2021   Procedure: WOUND VAC CHANGE;  Surgeon: [name], MD;  Location: [facility] OR;  Service: Vascular;  Laterality: Left;   INCISION AND DRAINAGE OF WOUND Left 12/04/2021   Procedure: IRRIGATION AND DEBRIDMENT OF STERNOCLAVICULAR WOUND, WOUND VAC PLACMENT.;  Surgeon: [name], MD;  Location: [facility] OR;  Service: Vascular;  Laterality: Left;   IRRIGATION AND DEBRIDEMENT ABSCESS Left 12/07/2021   Procedure: IRRIGATION AND DEBRIDEMENT ABSCESS;  Surgeon: [name], MD;  Location: [facility] OR;  Service: Vascular;  Laterality: Left;   TEE WITHOUT CARDIOVERSION N/A 12/08/2021   Procedure: TRANSESOPHAGEAL ECHOCARDIOGRAM (TEE);  Surgeon: Christell Constant, MD;  Location:  [facility] ENDOSCOPY;  Service: Cardiovascular;  Laterality: N/A;       Home Medications    Prior to Admission medications   Medication Sig Start Date End Date Taking? Authorizing Provider  acetaminophen (TYLENOL) 325 MG tablet Take 2 tablets (650 mg total) by mouth every 6 (six) hours as needed for mild pain, fever or headache (or Fever >/= 101). 12/02/21   Meredeth Ide, MD  dapagliflozin propanediol (FARXIGA) 10 MG TABS tablet Take 1 tablet (10 mg total) by mouth daily. 07/15/23   [name], MD  Dulaglutide (TRULICITY) 1.5 MG/0.5ML SOPN Inject 1.5 mg into the skin once a week. 04/26/23   [name], MD  glucose blood (ACCU-CHEK AVIVA PLUS) test strip Use to test blood glucose daily. 04/26/23   [name], MD  Insulin Pen Needle (PEN NEEDLES 3/16") 31G X 5 MM MISC 14 Units by Does not apply route 2 (two) times daily with a meal. 12/02/21   Sharl Ma, Sarina Ill, MD  Plecanatide (TRULANCE) 3 MG TABS Take 1 tablet (3 mg total) by mouth daily. 10/10/22   [name], MD  polyethylene glycol (MIRALAX / GLYCOLAX) 17 g packet Take 17 g by mouth daily as needed. 12/02/21   Meredeth Ide, MD  rosuvastatin (CRESTOR) 10 MG tablet TAKE 1 TABLET BY MOUTH EVERY DAY 04/22/23   [name], MD    Family History Family History  Adopted: Yes  Social History Social History   Tobacco Use   Smoking status: Never   Smokeless tobacco: Never  Vaping Use   Vaping status: Never Used  Substance Use Topics   Alcohol use: Not Currently    Comment: occasional   Drug use: No     Allergies   Codeine, Glipizide er [glipizide], and Metformin and related   Review of Systems Review of Systems  Constitutional: Negative.   HENT:  Positive for congestion and sore throat.   Respiratory:  Positive for cough.   Cardiovascular: Negative.   Gastrointestinal: Negative.   Musculoskeletal: Negative.   Psychiatric/Behavioral: Negative.       Physical Exam Triage Vital  Signs ED Triage Vitals  Encounter Vitals Group     BP 09/13/23 1339 120/81     Systolic BP Percentile --      Diastolic BP Percentile --      Pulse Rate 09/13/23 1339 80     Resp 09/13/23 1339 18     Temp 09/13/23 1339 98 F (36.7 C)     Temp Source 09/13/23 1339 Oral     SpO2 09/13/23 1339 99 %     Weight --      Height --      Head Circumference --      Peak Flow --      Pain Score 09/13/23 1337 0     Pain Loc --      Pain Education --      Exclude from Growth Chart --    No data found.  Updated Vital Signs BP 120/81 (BP Location: Right Arm)   Pulse 80   Temp 98 F (36.7 C) (Oral)   Resp 18   SpO2 99%   Visual Acuity Right Eye Distance:   Left Eye Distance:   Bilateral Distance:    Right Eye Near:   Left Eye Near:    Bilateral Near:     Physical Exam Constitutional:      Appearance: He is well-developed and normal weight.  HENT:     Nose: Congestion and rhinorrhea present.     Mouth/Throat:     Mouth: Mucous membranes are moist.     Pharynx: Posterior oropharyngeal erythema present. No pharyngeal swelling or oropharyngeal exudate.     Tonsils: No tonsillar exudate.  Cardiovascular:     Rate and Rhythm: Normal rate and regular rhythm.     Heart sounds: Normal heart sounds.  Pulmonary:     Effort: Pulmonary effort is normal.     Breath sounds: Normal breath sounds. No wheezing or rhonchi.  Musculoskeletal:     Cervical back: Normal range of motion and neck supple.  Lymphadenopathy:     Cervical: No cervical adenopathy.  Skin:    General: Skin is warm.  Neurological:     General: No focal deficit present.     Mental Status: He is alert.  Psychiatric:        Mood and Affect: Mood normal.      UC Treatments / Results  Labs (all labs ordered are listed, but only abnormal results are displayed) Labs Reviewed  CULTURE, GROUP A STREP [facility])  POCT RAPID STREP A (OFFICE)    EKG   Radiology No results found.  Procedures Procedures (including  critical care time)  Medications Ordered in UC Medications - No data to display  Initial Impression / Assessment and Plan / UC Course  I have reviewed the triage vital signs and the nursing notes.  Pertinent  labs & imaging results that were available during my care of the patient were reviewed by me and considered in my medical decision making (see chart for details).    Final Clinical Impressions(s) / UC Diagnoses   Final diagnoses:  Upper respiratory tract infection, unspecified type     Discharge Instructions      You were seen today for upper respiratory symptoms.  Your strep test was negative today.  This will be sent to the lab for further testing and you will be notified if positive.  In the mean time, your symptoms appear viral at this time.  I see no evidence of bacterial infection.  I have sent out a medication to help with the cough.  I recommend you take over the counter claritin or zyrtec and flonase to help with sinus congestion, drainage and sneezing.  Please return or follow up with your primary care provider if you are not improving.     ED Prescriptions     Medication Sig Dispense Auth. Provider   benzonatate (TESSALON) 200 MG capsule Take 1 capsule (200 mg total) by mouth 3 (three) times daily as needed for cough. 21 capsule Jannifer Franklin, MD      PDMP not reviewed this encounter.   Jannifer Franklin, MD 09/13/23 1407

## 2023-09-13 NOTE — Telephone Encounter (Signed)
Copied from CRM [id number]. Topic: Clinical - Red Word Triage >> Sep 13, 2023 12:05 PM Pascal Lux wrote: Red Word that prompted transfer to Nurse Triage: Patient states he may have the flu has a sore throat and needs antibiotics for the flu; sneezing, running nose, dry cough.   Chief Complaint: Cough Symptoms: Cough, sore throat, runny nose Frequency: Frequent cough Pertinent Negatives: Patient denies fever, shortness of breath  Disposition: [] ED /[x] Urgent Care (no appt availability in office) / [] Appointment(In office/virtual)/ []  [facility]/ [] Home Care/ [] Refused Recommended Disposition /[] [place] [facility]/ []  Follow-up with PCP Additional Notes: Patient states he began to experience a cough, sore throat, and runny nose 2 days ago. He denies any fevers. Patient is requesting an antibiotic due to getting pneumonia the last time he had similar symptoms. I advised the patient I could get him an appointment for Monday but he does not wait that long. Patient advised to go to urgent care if he feels he needs to be seen today. Patient states he would like the office to call him about an antibiotic and has agreed to go to urgent care if he does not hear anything back by the end of the day.      Reason for Disposition  [1] Patient also has allergy symptoms (e.g., itchy eyes, clear nasal discharge, postnasal drip) AND [2] they are acting up  Answer Assessment - Initial Assessment Questions 1. ONSET: "When did the cough begin?"      2 days ago 2. SEVERITY: "How bad is the cough today?"      Mild today  3. SPUTUM: "Describe the color of your sputum" (none, dry cough; clear, white, yellow, green)     No 4. HEMOPTYSIS: "Are you coughing up any blood?" If so ask: "How much?" (flecks, streaks, tablespoons, etc.)     No 5. DIFFICULTY BREATHING: "Are you having difficulty breathing?" If Yes, ask: "How bad is it?" (e.g., mild, moderate, severe)    - MILD: No SOB at rest, mild SOB with  walking, speaks normally in sentences, can lie down, no retractions, pulse < 100.    - MODERATE: SOB at rest, SOB with minimal exertion and prefers to sit, cannot lie down flat, speaks in phrases, mild retractions, audible wheezing, pulse 100-120.    - SEVERE: Very SOB at rest, speaks in single words, struggling to breathe, sitting hunched forward, retractions, pulse > 120      Difficulty sleeping  6. FEVER: "Do you have a fever?" If Yes, ask: "What is your temperature, how was it measured, and when did it start?"     No 7. CARDIAC HISTORY: "Do you have any history of heart disease?" (e.g., heart attack, congestive heart failure)      No 8. LUNG HISTORY: "Do you have any history of lung disease?"  (e.g., pulmonary embolus, asthma, emphysema)     No 9. PE RISK FACTORS: "Do you have a history of blood clots?" (or: recent major surgery, recent prolonged travel, bedridden)     No 10. OTHER SYMPTOMS: "Do you have any other symptoms?" (e.g., runny nose, wheezing, chest pain)       Runny nose, sneezing, sore throat  11. PREGNANCY: "Is there any chance you are pregnant?" "When was your last menstrual period?"       N/A 12. TRAVEL: "Have you traveled out of the country in the last month?" (e.g., travel history, exposures)       No  Protocols used: Cough - Acute  Non-Productive-A-AH

## 2023-09-16 LAB — CULTURE, GROUP A STREP (THRC)

## 2023-09-17 ENCOUNTER — Other Ambulatory Visit: Payer: Self-pay

## 2023-09-17 ENCOUNTER — Encounter: Payer: Self-pay | Admitting: Internal Medicine

## 2023-09-17 ENCOUNTER — Ambulatory Visit: Payer: Medicare Other | Admitting: Internal Medicine

## 2023-09-17 VITALS — BP 113/74 | HR 65 | Temp 98.1°F | Wt 176.0 lb

## 2023-09-17 DIAGNOSIS — J069 Acute upper respiratory infection, unspecified: Secondary | ICD-10-CM

## 2023-09-17 MED ORDER — BENZONATATE 100 MG PO CAPS: 100.0000 mg | ORAL_CAPSULE | Freq: Three times a day (TID) | ORAL | 0 refills | Status: AC | PRN

## 2023-09-17 NOTE — Progress Notes (Signed)
[facility]  Reason for Consult:concern for pneumococcal infection Referring Provider: self    Patient Active Problem List   Diagnosis Date Noted   Hyperlipidemia associated with type 2 diabetes mellitus (HCC) 01/08/2023   Chronic idiopathic constipation 10/10/2022   Chronic pain of left knee 03/29/2022   Right inguinal hernia 10/12/2021   Type 2 diabetes mellitus (HCC) 06/07/2015   Thrombocytopenia (HCC) 08/03/2012      HPI: David Strong is a 68 y.o. 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 Afebrile; normal o2 sat; no sign of sepsis/sirs Rapid strep screen and cx negative; no other viral pcr was done I do not see any chest imaging ordered Sent home with tessalon and guidance for supportive care  He called [facility] for further evaluation  He is feeling better Cough some brown stuff mucoid at night but sleeping well  He wants some more cough medication  No fever, chill, malaise   Review of Systems: ROS All other ros negative       Past Medical History:  Diagnosis Date   Diabetes mellitus without complication (HCC)    Pneumonia    Sternoclavicular joint subluxation, left, initial encounter    In setting of joint abscess    Social History   Tobacco Use   Smoking status: Never   Smokeless tobacco: Never  Vaping Use   Vaping status: Never Used  Substance Use Topics   Alcohol use: Not Currently    Comment: occasional   Drug use: No    Family History  Adopted: Yes    Allergies  Allergen Reactions   Codeine Itching   Glipizide Er [Glipizide]     SOB, malaise. He tolerates XL without issues.    Metformin And Related Rash    OBJECTIVE: There were no vitals filed for this visit. There is no height or weight on file to calculate BMI.   Physical Exam General/constitutional: no distress, pleasant HEENT: Normocephalic, PER, Conj Clear, EOMI, Oropharynx clear Neck supple CV: rrr no mrg Lungs: clear to auscultation, normal respiratory effort Abd: Soft, Nontender Ext: no edema Skin: No Rash Neuro: nonfocal MSK: no peripheral joint swelling/tenderness/warmth; back spines nontender   Lab: Lab Results  Component Value Date   WBC 3.1 (L) 01/08/2023   HGB 16.2 01/08/2023   HCT 48.9 01/08/2023   MCV 88.9 01/08/2023   PLT 107.0 (L) 01/08/2023   Last metabolic panel Lab Results  Component Value Date   GLUCOSE 122 (H) 07/11/2023   NA 143 07/11/2023   K 4.5 07/11/2023   CL 104 07/11/2023   CO2 30 07/11/2023   BUN 17 07/11/2023   CREATININE 1.15 07/11/2023   GFR 66.07 07/11/2023   CALCIUM 9.9 07/11/2023   PHOS 3.1 12/15/2021   PROT 7.1 07/11/2023   ALBUMIN 4.8 07/11/2023   LABGLOB 2.3 04/07/2020   AGRATIO 2.0 04/07/2020   BILITOT 0.7 07/11/2023   ALKPHOS 52 07/11/2023   AST 20 07/11/2023   ALT 19 07/11/2023   ANIONGAP 6 12/15/2021    Microbiology:  Serology:  Imaging:   Assessment/plan: Problem List Items Addressed This Visit   None Visit Diagnoses       Viral  upper respiratory tract infection    -  Primary          68 y.o. 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  Raymondo Band, MD [facility] 09/17/2023, 2:25 PM

## 2023-09-17 NOTE — Patient Instructions (Signed)
Your viral respiratory tract infection is improving  These will improve on its own  Take tessalon tablet as needed to help with cough   If you start to have fever > 100.4, chill, worsening cough, short of breath, malaise in the next 1-2 weeks please let us know   Thank you

## 2023-10-25 ENCOUNTER — Ambulatory Visit: Payer: Self-pay | Admitting: Emergency Medicine

## 2023-10-25 NOTE — Telephone Encounter (Signed)
 Call agent warm transferred patient. Began asking triage questions, patient not answering. Patient states he is changing a child at the time and would like a call back for triage in 10 minutes. Confirmed call back # with patient.  He denies any dizziness, weakness, SOB at this time.  Copied from CRM [id number]. Topic: Clinical - Red Word Triage >> Oct 25, 2023 11:07 AM Shelbie Proctor wrote: Red Word that prompted transfer to Nurse Triage: Patient [id number] wants to speak with CMA regarding his blood sugar level. Patient had the flu and was taking cold/ flu medication, noticed his sugar level high. Some of the blood sugar readings: 10/20/23 194, 10/21/23 174, 10/22/23 212, 10/23/23 133, 10/24/23 188, and 10/25/23 174. Patient symptoms sneezing, and dry cough. Patient denies no pain, fever, dizziness, fatigue, nor shortness of breath. Please advise.

## 2023-10-25 NOTE — Telephone Encounter (Signed)
 Chief Complaint: Blood sugar level high Symptoms: No symptoms Frequency: Intermittent Pertinent Negatives: Patient denies  frequent urination, difficulty breathing, dizziness, weakness, vomiting Disposition: [] ED /[] Urgent Care (no appt availability in office) / [] Appointment(In office/virtual)/ []  [facility]/ [] Home Care/ [] Refused Recommended Disposition /[] [place] [facility]/ [x]  Follow-up with PCP Additional Notes: Pt states he was recently sick and has been taking OTC cold+flu medication. Pt states he noticed his blood sugar has been higher after taking the medication. Pt states his fasting blood glucose level this morning was 174. Pt wants to know if Dr. Alvy Bimler recommends he stops taking the cold+flu medication.   Reason for Disposition  Blood glucose 70-240 mg/dL (3.9 -16.1 mmol/L)  Answer Assessment - Initial Assessment Questions 1. BLOOD GLUCOSE: "What is your blood glucose level?"      174 2. ONSET: "When did you check the blood glucose?"     Morning 3. USUAL RANGE: "What is your glucose level usually?" (e.g., usual fasting morning value, usual evening value)     125-130 4. INSULIN: "Do you take insulin?" "What type of insulin(s) do you use? What is the mode of delivery? (syringe, pen; injection or pump)?"      No 7. DIABETES PILLS: "Do you take any pills for your diabetes?" If Yes, ask: "Have you missed taking any pills recently?"     Yes takes Trulicity once a week, haven't missed 8. OTHER SYMPTOMS: "Do you have any symptoms?" (e.g., fever, frequent urination, difficulty breathing, dizziness, weakness, vomiting)     Denies  Protocols used: Diabetes - High Blood Sugar-A-AH

## 2023-10-28 NOTE — Telephone Encounter (Signed)
 Please schedule office visit with Dr. Alvy Bimler or any provider to address the problem with hypoglycemia.  Thank you

## 2023-10-29 NOTE — Telephone Encounter (Signed)
 Spoke with patient and he states his glucose level is back to normal. Did advise him if he still saw it high to schedule an appt. He understood.

## 2024-01-08 ENCOUNTER — Ambulatory Visit (INDEPENDENT_AMBULATORY_CARE_PROVIDER_SITE_OTHER): Payer: Medicare Other | Admitting: Emergency Medicine

## 2024-01-08 ENCOUNTER — Encounter: Payer: Self-pay | Admitting: Emergency Medicine

## 2024-01-08 ENCOUNTER — Ambulatory Visit (INDEPENDENT_AMBULATORY_CARE_PROVIDER_SITE_OTHER): Payer: Medicare Other

## 2024-01-08 VITALS — Ht 64.0 in | Wt 176.0 lb

## 2024-01-08 VITALS — BP 108/70 | HR 74 | Temp 98.2°F | Ht 64.0 in | Wt 174.0 lb

## 2024-01-08 DIAGNOSIS — E1169 Type 2 diabetes mellitus with other specified complication: Secondary | ICD-10-CM | POA: Diagnosis not present

## 2024-01-08 DIAGNOSIS — E785 Hyperlipidemia, unspecified: Secondary | ICD-10-CM

## 2024-01-08 DIAGNOSIS — Z0000 Encounter for general adult medical examination without abnormal findings: Secondary | ICD-10-CM

## 2024-01-08 DIAGNOSIS — K4090 Unilateral inguinal hernia, without obstruction or gangrene, not specified as recurrent: Secondary | ICD-10-CM

## 2024-01-08 DIAGNOSIS — Z23 Encounter for immunization: Secondary | ICD-10-CM | POA: Diagnosis not present

## 2024-01-08 LAB — CBC WITH DIFFERENTIAL/PLATELET
Basophils Absolute: 0 10*3/uL (ref 0.0–0.1)
Basophils Relative: 0.2 % (ref 0.0–3.0)
Eosinophils Absolute: 0 10*3/uL (ref 0.0–0.7)
Eosinophils Relative: 1.5 % (ref 0.0–5.0)
HCT: 45.6 % (ref 39.0–52.0)
Hemoglobin: 15.2 g/dL (ref 13.0–17.0)
Lymphocytes Relative: 45.4 % (ref 12.0–46.0)
Lymphs Abs: 1.3 10*3/uL (ref 0.7–4.0)
MCHC: 33.2 g/dL (ref 30.0–36.0)
MCV: 88.4 fl (ref 78.0–100.0)
Monocytes Absolute: 0.2 10*3/uL (ref 0.1–1.0)
Monocytes Relative: 7.5 % (ref 3.0–12.0)
Neutro Abs: 1.3 10*3/uL — ABNORMAL LOW (ref 1.4–7.7)
Neutrophils Relative %: 45.4 % (ref 43.0–77.0)
Platelets: 93 10*3/uL — ABNORMAL LOW (ref 150.0–400.0)
RBC: 5.17 Mil/uL (ref 4.22–5.81)
RDW: 14.8 % (ref 11.5–15.5)
WBC: 2.9 10*3/uL — ABNORMAL LOW (ref 4.0–10.5)

## 2024-01-08 LAB — COMPREHENSIVE METABOLIC PANEL WITH GFR
ALT: 15 U/L (ref 0–53)
AST: 18 U/L (ref 0–37)
Albumin: 4.7 g/dL (ref 3.5–5.2)
Alkaline Phosphatase: 50 U/L (ref 39–117)
BUN: 17 mg/dL (ref 6–23)
CO2: 26 meq/L (ref 19–32)
Calcium: 9.3 mg/dL (ref 8.4–10.5)
Chloride: 105 meq/L (ref 96–112)
Creatinine, Ser: 0.96 mg/dL (ref 0.40–1.50)
GFR: 81.77 mL/min (ref >60.00)
Glucose, Bld: 171 mg/dL — ABNORMAL HIGH (ref 70–99)
Potassium: 3.9 meq/L (ref 3.5–5.1)
Sodium: 139 meq/L (ref 135–145)
Total Bilirubin: 0.6 mg/dL (ref 0.2–1.2)
Total Protein: 6.5 g/dL (ref 6.0–8.3)

## 2024-01-08 LAB — POCT GLYCOSYLATED HEMOGLOBIN (HGB A1C): Hemoglobin A1C: 7.8 % — AB (ref 4.0–5.6)

## 2024-01-08 LAB — MICROALBUMIN / CREATININE URINE RATIO
Creatinine,U: 76.8 mg/dL
Microalb Creat Ratio: UNDETERMINED mg/g (ref 0.0–30.0)
Microalb, Ur: <0.7 mg/dL

## 2024-01-08 LAB — LIPID PANEL
Cholesterol: 100 mg/dL (ref 0–200)
HDL: 42.2 mg/dL (ref >39.00)
LDL Cholesterol: 40 mg/dL (ref 0–99)
NonHDL: 58.19
Total CHOL/HDL Ratio: 2
Triglycerides: 93 mg/dL (ref 0.0–149.0)
VLDL: 18.6 mg/dL (ref 0.0–40.0)

## 2024-01-08 LAB — PSA: PSA: 0.62 ng/mL (ref 0.10–4.00)

## 2024-01-08 MED ORDER — TRULICITY 3 MG/0.5ML ~~LOC~~ SOAJ: 3.0000 mg | SUBCUTANEOUS | 1 refills | Status: DC

## 2024-01-08 NOTE — Progress Notes (Signed)
 Subjective:   David Strong is a 68 y.o. who presents for a Medicare Wellness preventive visit.  As a reminder, Annual Wellness Visits don't include a physical exam, and some assessments may be limited, especially if this visit is performed virtually. We may recommend an in-person follow-up visit with your provider if needed.  Visit Complete: Virtual I connected with  David Strong on 01/08/24 by a audio enabled telemedicine application and verified that I am speaking with the correct person using two identifiers.  Patient Location: Home  Provider Location: Home Office  I discussed the limitations of evaluation and management by telemedicine. The patient expressed understanding and agreed to proceed.  Vital Signs: Because this visit was a virtual/telehealth visit, some criteria may be missing or patient reported. Any vitals not documented were not able to be obtained and vitals that have been documented are patient reported.  VideoDeclined- This patient declined Librarian, academic. Therefore the visit was completed with audio only.  Persons Participating in Visit: Patient.  AWV Questionnaire: No: Patient Medicare AWV questionnaire was not completed prior to this visit.  Cardiac Risk Factors include: advanced age (>75men, >10 women);male gender;diabetes mellitus;dyslipidemia     Objective:     Today's Vitals   01/08/24 1126  Weight: 176 lb (79.8 kg)  Height: 5\' 4"  (1.626 m)   Body mass index is 30.21 kg/m.     01/08/2024   11:40 AM 06/28/2022   10:19 AM 11/22/2021    8:30 PM 11/22/2021    7:37 PM 11/22/2021    3:56 PM  Advanced Directives  Does Patient Have a Medical Advance Directive? No No No No No  Would patient like information on creating a medical advance directive?  No - Patient declined No - Patient declined No - Patient declined     Current Medications (verified) Outpatient Encounter Medications as of 01/08/2024  Medication Sig    acetaminophen  (TYLENOL ) 325 MG tablet Take 2 tablets (650 mg total) by mouth every 6 (six) hours as needed for mild pain, fever or headache (or Fever >/= 101).   dapagliflozin  propanediol (FARXIGA ) 10 MG TABS tablet Take 1 tablet (10 mg total) by mouth daily.   Dulaglutide  (TRULICITY ) 1.5 MG/0.5ML SOPN Inject 1.5 mg into the skin once a week.   glipiZIDE  (GLUCOTROL  XL) 10 MG 24 hr tablet Take 10 mg by mouth 2 (two) times daily.   glucose blood (ACCU-CHEK AVIVA PLUS) test strip Use to test blood glucose daily.   Insulin  Pen Needle (PEN NEEDLES 3/16") 31G X 5 MM MISC 14 Units by Does not apply route 2 (two) times daily with a meal.   rosuvastatin  (CRESTOR ) 10 MG tablet TAKE 1 TABLET BY MOUTH EVERY DAY   benzonatate  (TESSALON  PERLES) 100 MG capsule Take 1 capsule (100 mg total) by mouth 3 (three) times daily as needed for cough. (Patient not taking: Reported on 01/08/2024)   Plecanatide  (TRULANCE ) 3 MG TABS Take 1 tablet (3 mg total) by mouth daily. (Patient not taking: Reported on 01/08/2024)   polyethylene glycol (MIRALAX  / GLYCOLAX ) 17 g packet Take 17 g by mouth daily as needed. (Patient not taking: Reported on 01/08/2024)   No facility-administered encounter medications on file as of 01/08/2024.    Allergies (verified) Codeine, Glipizide  er [glipizide ], and Metformin  and related   History: Past Medical History:  Diagnosis Date   Diabetes mellitus without complication (HCC)    Pneumonia    Sternoclavicular joint subluxation, left, initial encounter    In setting  of joint abscess   Past Surgical History:  Procedure Laterality Date   APPLICATION OF WOUND VAC Left 12/07/2021   Procedure: WOUND VAC CHANGE;  Surgeon: Zelphia Higashi, MD;  Location: [facility] OR;  Service: Vascular;  Laterality: Left;   INCISION AND DRAINAGE OF WOUND Left 12/04/2021   Procedure: IRRIGATION AND DEBRIDMENT OF STERNOCLAVICULAR WOUND, WOUND VAC PLACMENT.;  Surgeon: Zelphia Higashi, MD;  Location: [facility] OR;  Service:  Vascular;  Laterality: Left;   IRRIGATION AND DEBRIDEMENT ABSCESS Left 12/07/2021   Procedure: IRRIGATION AND DEBRIDEMENT ABSCESS;  Surgeon: Zelphia Higashi, MD;  Location[facility] OR;  Service: Vascular;  Laterality: Left;   TEE WITHOUT CARDIOVERSION N/A 12/08/2021   Procedure: TRANSESOPHAGEAL ECHOCARDIOGRAM (TEE);  Surgeon: Jann Melody, MD;  Location: [facility] ENDOSCOPY;  Service: Cardiovascular;  Laterality: N/A;   Family History  Adopted: Yes   Social History   Socioeconomic History   Marital status: Married    Spouse name: Nicholes Barks   Number of children: 3   Years of education: college   Highest education level: Not on file  Occupational History   Occupation: Semi Retired  Tobacco Use   Smoking status: Never   Smokeless tobacco: Never  Vaping Use   Vaping status: Never Used  Substance and Sexual Activity   Alcohol use: Not Currently    Comment: occasional   Drug use: No   Sexual activity: Yes  Other Topics Concern   Not on file  Social History Narrative   Son lives with patient and wife/2025   Social Drivers of Health   Financial Resource Strain: Low Risk  (01/08/2024)   Overall Financial Resource Strain (CARDIA)    Difficulty of Paying Living Expenses: Not very hard  Food Insecurity: No Food Insecurity (01/08/2024)   Hunger Vital Sign    Worried About Running Out of Food in the Last Year: Never true    Ran Out of Food in the Last Year: Never true  Transportation Needs: No Transportation Needs (01/08/2024)   PRAPARE - Administrator, Civil Service (Medical): No    Lack of Transportation (Non-Medical): No  Physical Activity: Unknown (01/08/2024)   Exercise Vital Sign    Days of Exercise per Week: Not on file    Minutes of Exercise per Session: 0 min  Stress: No Stress Concern Present (01/08/2024)   [employer] of Occupational Health - Occupational Stress Questionnaire    Feeling of Stress : Only a little  Social Connections: Moderately Integrated  (01/08/2024)   Social Connection and Isolation Panel [NHANES]    Frequency of Communication with Friends and Family: Once a week    Frequency of Social Gatherings with Friends and Family: Once a week    Attends Religious Services: More than 4 times per year    Active Member of [employer] or Organizations: Yes    Attends Banker Meetings: Never    Marital Status: Married    Tobacco Counseling Counseling given: Not Answered    Clinical Intake:  Pre-visit preparation completed: Yes  Pain : No/denies pain     BMI - recorded: 30.21 Nutritional Status: BMI > 30  Obese Nutritional Risks: None Diabetes: Yes CBG done?: Yes (135-per pt) CBG resulted in Enter/ Edit results?: No Did pt. bring in CBG monitor from home?: No  Lab Results  Component Value Date   HGBA1C 7.1 (A) 07/11/2023   HGBA1C 7.5 (A) 04/10/2023   HGBA1C 7.5 (A) 01/08/2023     How often do you  need to have someone help you when you read instructions, pamphlets, or other written materials from your doctor or pharmacy?: 1 - Never  Interpreter Needed?: No  Information entered by :: Nike Southwell, RMA   Activities of Daily Living     01/08/2024   11:27 AM  In your present state of health, do you have any difficulty performing the following activities:  Hearing? 0  Vision? 0  Difficulty concentrating or making decisions? 0  Walking or climbing stairs? 0  Dressing or bathing? 0  Doing errands, shopping? 0  Preparing Food and eating ? N  Using the Toilet? N  In the past six months, have you accidently leaked urine? N  Do you have problems with loss of bowel control? N  Managing your Medications? N  Managing your Finances? N  Housekeeping or managing your Housekeeping? N    Patient Care Team: Elvira Hammersmith, MD as PCP - General (Internal Medicine)  Indicate any recent Medical Services you may have received from other than [facility] providers in the past year (date may be approximate).      Assessment:    This is a routine wellness examination for David Strong.  Hearing/Vision screen Hearing Screening - Comments:: Denies hearing difficulties   Vision Screening - Comments:: Denies vision issues.    Goals Addressed             This Visit's Progress    Patient Stated   On track    Continue to improve my diabetes and stay active.       Depression Screen     01/08/2024   11:44 AM 07/11/2023    3:13 PM 04/10/2023    2:51 PM 01/08/2023    2:46 PM 10/10/2022    3:13 PM 07/04/2022    3:36 PM 06/28/2022   10:21 AM  PHQ 2/9 Scores  PHQ - 2 Score 0 0 0 1 0 0 0  PHQ- 9 Score 1  0        Fall Risk     01/08/2024   11:40 AM 07/11/2023    3:13 PM 04/10/2023    2:51 PM 01/08/2023    2:46 PM 10/10/2022    3:13 PM  Fall Risk   Falls in the past year? 0 0 0 0 0  Number falls in past yr:  0 0 0 0  Injury with Fall?  0 0 0 0  Risk for fall due to :  No Fall Risks No Fall Risks No Fall Risks No Fall Risks  Follow up Falls evaluation completed;Falls prevention discussed Falls evaluation completed Falls evaluation completed Falls evaluation completed Falls evaluation completed    MEDICARE RISK AT HOME:  Medicare Risk at Home Any stairs in or around the home?: Yes If so, are there any without handrails?: Yes Home free of loose throw rugs in walkways, pet beds, electrical cords, etc?: Yes Adequate lighting in your home to reduce risk of falls?: Yes Life alert?: No Use of a cane, walker or w/c?: No Grab bars in the bathroom?: No Shower chair or bench in shower?: No Elevated toilet seat or a handicapped toilet?: No  TIMED UP AND GO:  Was the test performed?  No  Cognitive Function: Declined/Normal: No cognitive concerns noted by patient or family. Patient alert, oriented, able to answer questions appropriately and recall recent events. No signs of memory loss or confusion.        06/28/2022   10:23 AM  6CIT Screen  What Year?  0 points  What month? 0 points  What time? 0  points  Count back from 20 0 points  Months in reverse 0 points  Repeat phrase 4 points  Total Score 4 points    Immunizations Immunization History  Administered Date(s) Administered   Hepatitis A 07/25/2006   Td 11/28/2005    Screening Tests Health Maintenance  Topic Date Due   Pneumonia Vaccine 67+ Years old (1 of 2 - PCV) Never done   Zoster Vaccines- Shingrix (1 of 2) Never done   FOOT EXAM  01/29/2020   OPHTHALMOLOGY EXAM  02/03/2020   COVID-19 Vaccine (1 - 2024-25 season) Never done   HEMOGLOBIN A1C  01/08/2024   Fecal DNA (Cologuard)  05/25/2024   Diabetic kidney evaluation - eGFR measurement  07/10/2024   Diabetic kidney evaluation - Urine ACR  07/10/2024   Medicare Annual Wellness (AWV)  01/07/2025   Hepatitis C Screening  Completed   HPV VACCINES  Aged Out   Meningococcal B Vaccine  Aged Out   DTaP/Tdap/Td  Discontinued   INFLUENZA VACCINE  Discontinued    Health Maintenance  Health Maintenance Due  Topic Date Due   Pneumonia Vaccine 46+ Years old (1 of 2 - PCV) Never done   Zoster Vaccines- Shingrix (1 of 2) Never done   FOOT EXAM  01/29/2020   OPHTHALMOLOGY EXAM  02/03/2020   COVID-19 Vaccine (1 - 2024-25 season) Never done   HEMOGLOBIN A1C  01/08/2024   Health Maintenance Items Addressed: See Nurse Notes  Additional Screening:  Vision Screening: Recommended annual ophthalmology exams for early detection of glaucoma and other disorders of the eye.  Dental Screening: Recommended annual dental exams for proper oral hygiene  Community Resource Referral / Chronic Care Management: CRR required this visit?  No   CCM required this visit?  No   Plan:    I have personally reviewed and noted the following in the patient's chart:   Medical and social history Use of alcohol, tobacco or illicit drugs  Current medications and supplements including opioid prescriptions. Patient is not currently taking opioid prescriptions. Functional ability and  status Nutritional status Physical activity Advanced directives List of other physicians Hospitalizations, surgeries, and ER visits in previous 12 months Vitals Screenings to include cognitive, depression, and falls Referrals and appointments  In addition, I have reviewed and discussed with patient certain preventive protocols, quality metrics, and best practice recommendations. A written personalized care plan for preventive services as well as general preventive health recommendations were provided to patient.   Lynore Coscia L Nica Friske, CMA   01/08/2024   After Visit Summary: (Mail) Due to this being a telephonic visit, the after visit summary with patients personalized plan was offered to patient via mail   Notes: Please refer to Routing Comments.

## 2024-01-08 NOTE — Assessment & Plan Note (Signed)
 Still present and affecting quality of life Slightly tender to palpation Will place new referral to general surgeon for definitive care. Symptom management discussed.

## 2024-01-08 NOTE — Patient Instructions (Signed)
 [name] , Thank you for taking time out of your busy schedule to complete your Annual Wellness Visit with me. I enjoyed our conversation and look forward to speaking with you again next year. I, as well as your care team,  appreciate your ongoing commitment to your health goals. Please review the following plan we discussed and let me know if I can assist you in the future. Your Game plan/ To Do List   Follow up Visits: Next Medicare AWV with our clinical staff: 01/08/2025.   Have you seen your provider in the last 6 months (3 months if uncontrolled diabetes)? Yes Next Office Visit with your provider: 01/08/2024.  Clinician Recommendations:  Aim for 30 minutes of exercise or brisk walking, 6-8 glasses of water, and 5 servings of fruits and vegetables each day. You are due for 2nd shingles vaccine and a pneumonia vaccine.  Remember to set an appointment up with [facility] for a diabetic eye exam.        This is a list of the screening recommended for you and due dates:  Health Maintenance  Topic Date Due   Pneumonia Vaccine (1 of 2 - PCV) Never done   Zoster (Shingles) Vaccine (1 of 2) Never done   Complete foot exam   01/29/2020   Eye exam for diabetics  02/03/2020   COVID-19 Vaccine (1 - 2024-25 season) Never done   Hemoglobin A1C  01/08/2024   Cologuard (Stool DNA test)  05/25/2024   Yearly kidney function blood test for diabetes  07/10/2024   Yearly kidney health urinalysis for diabetes  07/10/2024   Medicare Annual Wellness Visit  01/07/2025   Hepatitis C Screening  Completed   HPV Vaccine  Aged Out   Meningitis B Vaccine  Aged Out   DTaP/Tdap/Td vaccine  Discontinued   Flu Shot  Discontinued    Advanced directives: (Declined) Advance directive discussed with you today. Even though you declined this today, please call our office should you change your mind, and we can give you the proper paperwork for you to fill out. Advance Care Planning is important because it:  [x]   Makes sure you receive the medical care that is consistent with your values, goals, and preferences  [x]  It provides guidance to your family and loved ones and reduces their decisional burden about whether or not they are making the right decisions based on your wishes.  Follow the link provided in your after visit summary or read over the paperwork we have mailed to you to help you started getting your Advance Directives in place. If you need assistance in completing these, please reach out to us  so that we can help you!  See attachments for Preventive Care and Fall Prevention Tips.

## 2024-01-08 NOTE — Progress Notes (Signed)
 David Strong 68 y.o.   Chief Complaint  Patient presents with   Follow-up    6 month f/u for DM. Patient mentions having concerns of what he thought was a hernia, states the bulge is up when his bladder is full and it will go down after he empty his bladder. wants a referral to urologist. Mentions that the referral that was placed 2 years ago he did not go to just to see if it will get better.     HISTORY OF PRESENT ILLNESS: This is a 68 y.o. male here for 64-month follow-up of chronic medical conditions including diabetes and dyslipidemia Also still struggling with right inguinal hernia.  Needs new referral. BP Readings from Last 3 Encounters:  09/17/23 113/74  09/13/23 120/81  07/11/23 112/78   Wt Readings from Last 3 Encounters:  01/08/24 176 lb (79.8 kg)  09/17/23 176 lb (79.8 kg)  07/11/23 171 lb 12.8 oz (77.9 kg)     HPI   Prior to Admission medications   Medication Sig Start Date End Date Taking? Authorizing Provider  acetaminophen  (TYLENOL ) 325 MG tablet Take 2 tablets (650 mg total) by mouth every 6 (six) hours as needed for mild pain, fever or headache (or Fever >/= 101). 12/02/21  Yes Ozell Blunt, MD  dapagliflozin  propanediol (FARXIGA ) 10 MG TABS tablet Take 1 tablet (10 mg total) by mouth daily. 07/15/23  Yes Christifer Chapdelaine, Isidro Margo, MD  Dulaglutide  (TRULICITY ) 1.5 MG/0.5ML SOPN Inject 1.5 mg into the skin once a week. 04/26/23  Yes Adeli Frost, Isidro Margo, MD  glipiZIDE  (GLUCOTROL  XL) 10 MG 24 hr tablet Take 10 mg by mouth 2 (two) times daily. 10/22/23  Yes [provider]  glucose blood (ACCU-CHEK AVIVA PLUS) test strip Use to test blood glucose daily. 04/26/23  Yes Matea Stanard Jose, MD  Insulin  Pen Needle (PEN NEEDLES 3/16") 31G X 5 MM MISC 14 Units by Does not apply route 2 (two) times daily with a meal. 12/02/21  Yes Alfonse Angle, Benuel Brazier, MD  rosuvastatin  (CRESTOR ) 10 MG tablet TAKE 1 TABLET BY MOUTH EVERY DAY 04/22/23  Yes Farrell Pantaleo, Isidro Margo, MD  benzonatate   (TESSALON  PERLES) 100 MG capsule Take 1 capsule (100 mg total) by mouth 3 (three) times daily as needed for cough. Patient not taking: Reported on 01/08/2024 09/17/23   Cephas Collier T, MD  Plecanatide  (TRULANCE ) 3 MG TABS Take 1 tablet (3 mg total) by mouth daily. Patient not taking: Reported on 01/08/2024 10/10/22   Kamla Skilton Jose, MD  polyethylene glycol (MIRALAX  / GLYCOLAX ) 17 g packet Take 17 g by mouth daily as needed. Patient not taking: Reported on 01/08/2024 12/02/21   Ozell Blunt, MD    Allergies  Allergen Reactions   Codeine Itching   Glipizide  Er [Glipizide ]     SOB, malaise. He tolerates XL without issues.   Metformin  And Related Rash    Patient Active Problem List   Diagnosis Date Noted   Hyperlipidemia associated with type 2 diabetes mellitus (HCC) 01/08/2023   Chronic idiopathic constipation 10/10/2022   Chronic pain of left knee 03/29/2022   Right inguinal hernia 10/12/2021   Type 2 diabetes mellitus (HCC) 06/07/2015   Thrombocytopenia (HCC) 08/03/2012    Past Medical History:  Diagnosis Date   Diabetes mellitus without complication (HCC)    Pneumonia    Sternoclavicular joint subluxation, left, initial encounter    In setting of joint abscess    Past Surgical History:  Procedure Laterality Date   APPLICATION OF  WOUND VAC Left 12/07/2021   Procedure: WOUND VAC CHANGE;  Surgeon: Zelphia Higashi, MD;  Location: [facility] OR;  Service: Vascular;  Laterality: Left;   INCISION AND DRAINAGE OF WOUND Left 12/04/2021   Procedure: IRRIGATION AND DEBRIDMENT OF STERNOCLAVICULAR WOUND, WOUND VAC PLACMENT.;  Surgeon: Zelphia Higashi, MD;  Location: [facility] OR;  Service: Vascular;  Laterality: Left;   IRRIGATION AND DEBRIDEMENT ABSCESS Left 12/07/2021   Procedure: IRRIGATION AND DEBRIDEMENT ABSCESS;  Surgeon: Zelphia Higashi, MD;  Location: [facility] OR;  Service: Vascular;  Laterality: Left;   TEE WITHOUT CARDIOVERSION N/A 12/08/2021   Procedure: TRANSESOPHAGEAL  ECHOCARDIOGRAM (TEE);  Surgeon: Jann Melody, MD;  Location: [facility] ENDOSCOPY;  Service: Cardiovascular;  Laterality: N/A;    Social History   Socioeconomic History   Marital status: Married    Spouse name: Nicholes Barks   Number of children: 3   Years of education: college   Highest education level: Not on file  Occupational History   Occupation: Semi Retired  Tobacco Use   Smoking status: Never   Smokeless tobacco: Never  Vaping Use   Vaping status: Never Used  Substance and Sexual Activity   Alcohol use: Not Currently    Comment: occasional   Drug use: No   Sexual activity: Yes  Other Topics Concern   Not on file  Social History Narrative   Son lives with patient and wife/2025   Social Drivers of Health   Financial Resource Strain: Low Risk  (01/08/2024)   Overall Financial Resource Strain (CARDIA)    Difficulty of Paying Living Expenses: Not very hard  Food Insecurity: No Food Insecurity (01/08/2024)   Hunger Vital Sign    Worried About Running Out of Food in the Last Year: Never true    Ran Out of Food in the Last Year: Never true  Transportation Needs: No Transportation Needs (01/08/2024)   PRAPARE - Administrator, Civil Service (Medical): No    Lack of Transportation (Non-Medical): No  Physical Activity: Unknown (01/08/2024)   Exercise Vital Sign    Days of Exercise per Week: Not on file    Minutes of Exercise per Session: 0 min  Stress: No Stress Concern Present (01/08/2024)   [employer] of Occupational Health - Occupational Stress Questionnaire    Feeling of Stress : Only a little  Social Connections: Moderately Integrated (01/08/2024)   Social Connection and Isolation Panel [NHANES]    Frequency of Communication with Friends and Family: Once a week    Frequency of Social Gatherings with Friends and Family: Once a week    Attends Religious Services: More than 4 times per year    Active Member of [employer] or Organizations: Yes    Attends Tax inspector Meetings: Never    Marital Status: Married  Catering manager Violence: Not At Risk (01/08/2024)   Humiliation, Afraid, Rape, and Kick questionnaire    Fear of Current or Ex-Partner: No    Emotionally Abused: No    Physically Abused: No    Sexually Abused: No    Family History  Adopted: Yes     Review of Systems  Constitutional: Negative.  Negative for chills and fever.  HENT: Negative.  Negative for congestion and sore throat.   Respiratory: Negative.  Negative for cough and shortness of breath.   Cardiovascular: Negative.  Negative for chest pain and palpitations.  Gastrointestinal:  Negative for abdominal pain, diarrhea, nausea and vomiting.  Genitourinary: Negative.  Negative for dysuria and urgency.  Neurological: Negative.  Negative for dizziness and headaches.  All other systems reviewed and are negative.   Today's Vitals   01/08/24 1407  BP: 108/70  Pulse: 74  Temp: 98.2 F (36.8 C)  TempSrc: Oral  SpO2: 95%  Weight: 174 lb (78.9 kg)  Height: 5\' 4"  (1.626 m)   Body mass index is 29.87 kg/m.   Physical Exam Vitals reviewed.  Constitutional:      Appearance: Normal appearance.  HENT:     Head: Normocephalic.     Mouth/Throat:     Mouth: Mucous membranes are moist.     Pharynx: Oropharynx is clear.  Eyes:     Extraocular Movements: Extraocular movements intact.     Conjunctiva/sclera: Conjunctivae normal.     Pupils: Pupils are equal, round, and reactive to light.  Cardiovascular:     Rate and Rhythm: Normal rate and regular rhythm.     Pulses: Normal pulses.     Heart sounds: Normal heart sounds.  Pulmonary:     Effort: Pulmonary effort is normal.     Breath sounds: Normal breath sounds.  Abdominal:     Palpations: Abdomen is soft.     Tenderness: There is no abdominal tenderness.     Hernia: A hernia is present. Hernia is present in the right inguinal area.  Skin:    General: Skin is warm and dry.  Neurological:     Mental Status:  He is alert and oriented to person, place, and time.  Psychiatric:        Mood and Affect: Mood normal.        Behavior: Behavior normal.   Results for orders placed or performed in visit on 01/08/24 (from the past 24 hours)  POCT HgB A1C     Status: Abnormal   Collection Time: 01/08/24  2:33 PM  Result Value Ref Range   Hemoglobin A1C 7.8 (A) 4.0 - 5.6 %   HbA1c POC (<> result, manual entry)     HbA1c, POC (prediabetic range)     HbA1c, POC (controlled diabetic range)       ASSESSMENT & PLAN: A total of 43 minutes was spent with the patient and counseling/coordination of care regarding preparing for this visit, review of most recent office visit notes, review of multiple chronic medical conditions and their management, review of all medications, review of most recent bloodwork results, review of health maintenance items, education on nutrition, prognosis, documentation, and need for follow up.   Problem List Items Addressed This Visit       Endocrine   Hyperlipidemia associated with type 2 diabetes mellitus (HCC) - Primary   Hemoglobin A1c is 7.8.  Still not at goal. Cardiovascular risks associated with uncontrolled diabetes and dyslipidemia discussed Diet and nutrition discussed. Continue Farxiga  10 mg daily, glipizide  10 mg twice a day.  Recommend to increase Trulicity  to 3 mg weekly Continue rosuvastatin  10 mg daily We will follow-up in 3 months Recommend blood work today      Relevant Medications   Dulaglutide  (TRULICITY ) 3 MG/0.5ML SOAJ   Other Relevant Orders   PSA   Comprehensive metabolic panel with GFR   Lipid panel   CBC with Differential/Platelet   Microalbumin / creatinine urine ratio     Other   Right inguinal hernia   Still present and affecting quality of life Slightly tender to palpation Will place new referral to general surgeon for definitive care. Symptom management discussed.      Relevant Orders  Ambulatory referral to General Surgery    Other Visit Diagnoses       Need for vaccination       Relevant Orders   Pneumococcal conjugate vaccine 20-valent (Prevnar 20)      Patient Instructions  Diabetes Mellitus and Nutrition, Adult When you have diabetes, or diabetes mellitus, it is very important to have healthy eating habits because your blood sugar (glucose) levels are greatly affected by what you eat and drink. Eating healthy foods in the right amounts, at about the same times every day, can help you: Manage your blood glucose. Lower your risk of heart disease. Improve your blood pressure. Reach or maintain a healthy weight. What can affect my meal plan? Every person with diabetes is different, and each person has different needs for a meal plan. Your health care provider may recommend that you work with a dietitian to make a meal plan that is best for you. Your meal plan may vary depending on factors such as: The calories you need. The medicines you take. Your weight. Your blood glucose, blood pressure, and cholesterol levels. Your activity level. Other health conditions you have, such as heart or kidney disease. How do carbohydrates affect me? Carbohydrates, also called carbs, affect your blood glucose level more than any other type of food. Eating carbs raises the amount of glucose in your blood. It is important to know how many carbs you can safely have in each meal. This is different for every person. Your dietitian can help you calculate how many carbs you should have at each meal and for each snack. How does alcohol affect me? Alcohol can cause a decrease in blood glucose (hypoglycemia), especially if you use insulin  or take certain diabetes medicines by mouth. Hypoglycemia can be a life-threatening condition. Symptoms of hypoglycemia, such as sleepiness, dizziness, and confusion, are similar to symptoms of having too much alcohol. Do not drink alcohol if: Your health care provider tells you not to drink. You  are pregnant, may be pregnant, or are planning to become pregnant. If you drink alcohol: Limit how much you have to: 0-1 drink a day for women. 0-2 drinks a day for men. Know how much alcohol is in your drink. In the [place] one drink equals one 12 oz bottle of beer (355 mL), one 5 oz glass of wine (148 mL), or one 1 oz glass of hard liquor (44 mL). Keep yourself hydrated with water, diet soda, or unsweetened iced tea. Keep in mind that regular soda, juice, and other mixers may contain a lot of sugar and must be counted as carbs. What are tips for following this plan?  Reading food labels Start by checking the serving size on the Nutrition Facts label of packaged foods and drinks. The number of calories and the amount of carbs, fats, and other nutrients listed on the label are based on one serving of the item. Many items contain more than one serving per package. Check the total grams (g) of carbs in one serving. Check the number of grams of saturated fats and trans fats in one serving. Choose foods that have a low amount or none of these fats. Check the number of milligrams (mg) of salt (sodium) in one serving. Most people should limit total sodium intake to less than 2,300 mg per day. Always check the nutrition information of foods labeled as "low-fat" or "nonfat." These foods may be higher in added sugar or refined carbs and should be avoided. Talk to your dietitian  to identify your daily goals for nutrients listed on the label. Shopping Avoid buying canned, pre-made, or processed foods. These foods tend to be high in fat, sodium, and added sugar. Shop around the outside edge of the grocery store. This is where you will most often find fresh fruits and vegetables, bulk grains, fresh meats, and fresh dairy products. Cooking Use low-heat cooking methods, such as baking, instead of high-heat cooking methods, such as deep frying. Cook using healthy oils, such as olive, canola, or sunflower  oil. Avoid cooking with butter, cream, or high-fat meats. Meal planning Eat meals and snacks regularly, preferably at the same times every day. Avoid going long periods of time without eating. Eat foods that are high in fiber, such as fresh fruits, vegetables, beans, and whole grains. Eat 4-6 oz (112-168 g) of lean protein each day, such as lean meat, chicken, fish, eggs, or tofu. One ounce (oz) (28 g) of lean protein is equal to: 1 oz (28 g) of meat, chicken, or fish. 1 egg.  cup (62 g) of tofu. Eat some foods each day that contain healthy fats, such as avocado, nuts, seeds, and fish. What foods should I eat? Fruits Berries. Apples. Oranges. Peaches. Apricots. Plums. Grapes. Mangoes. Papayas. Pomegranates. Kiwi. Cherries. Vegetables Leafy greens, including lettuce, spinach, kale, chard, collard greens, mustard greens, and cabbage. Beets. Cauliflower. Broccoli. Carrots. Green beans. Tomatoes. Peppers. Onions. Cucumbers. Brussels sprouts. Grains Whole grains, such as whole-wheat or whole-grain bread, crackers, tortillas, cereal, and pasta. Unsweetened oatmeal. Quinoa. Brown or wild rice. Meats and other proteins Seafood. Poultry without skin. Lean cuts of poultry and beef. Tofu. Nuts. Seeds. Dairy Low-fat or fat-free dairy products such as milk, yogurt, and cheese. The items listed above may not be a complete list of foods and beverages you can eat and drink. Contact a dietitian for more information. What foods should I avoid? Fruits Fruits canned with syrup. Vegetables Canned vegetables. Frozen vegetables with butter or cream sauce. Grains Refined white flour and flour products such as bread, pasta, snack foods, and cereals. Avoid all processed foods. Meats and other proteins Fatty cuts of meat. Poultry with skin. Breaded or fried meats. Processed meat. Avoid saturated fats. Dairy Full-fat yogurt, cheese, or milk. Beverages Sweetened drinks, such as soda or iced tea. The items  listed above may not be a complete list of foods and beverages you should avoid. Contact a dietitian for more information. Questions to ask a health care provider Do I need to meet with a certified diabetes care and education specialist? Do I need to meet with a dietitian? What number can I call if I have questions? When are the best times to check my blood glucose? Where to find more information: American Diabetes Association: diabetes.org Academy of Nutrition and Dietetics: eatright.[employer] of Diabetes and Digestive and Kidney Diseases: StageSync.si Association of Diabetes Care & Education Specialists: diabeteseducator.org Summary It is important to have healthy eating habits because your blood sugar (glucose) levels are greatly affected by what you eat and drink. It is important to use alcohol carefully. A healthy meal plan will help you manage your blood glucose and lower your risk of heart disease. Your health care provider may recommend that you work with a dietitian to make a meal plan that is best for you. This information is not intended to replace advice given to you by your health care provider. Make sure you discuss any questions you have with your health care provider. Document Revised: 03/08/2020 Document Reviewed:  03/09/2020 Elsevier Patient Education  2024 Elsevier Inc.     Maryagnes Small, MD [facility]

## 2024-01-08 NOTE — Assessment & Plan Note (Signed)
 Hemoglobin A1c is 7.8.  Still not at goal. Cardiovascular risks associated with uncontrolled diabetes and dyslipidemia discussed Diet and nutrition discussed. Continue Farxiga  10 mg daily, glipizide  10 mg twice a day.  Recommend to increase Trulicity  to 3 mg weekly Continue rosuvastatin  10 mg daily We will follow-up in 3 months Recommend blood work today

## 2024-01-08 NOTE — Patient Instructions (Signed)

## 2024-01-09 ENCOUNTER — Telehealth: Payer: Self-pay

## 2024-01-09 ENCOUNTER — Ambulatory Visit: Payer: Self-pay | Admitting: Emergency Medicine

## 2024-01-09 NOTE — Telephone Encounter (Signed)
 Copied from CRM [id number]. Topic: Clinical - Lab/Test Results >> Jan 09, 2024 10:18 AM Lotus Round B wrote: Reason for CRM: pt called in to see if Dr.Sagardia can send his lab results to his home through the mail . He doesn't have mychart. If not possible if he can get a call about other options .

## 2024-01-09 NOTE — Telephone Encounter (Signed)
 I have spoken to patient, will place labs in mail today. He gave verbal understanding

## 2024-01-24 NOTE — Telephone Encounter (Signed)
 Okay to order.  Not sure it is going to be covered by insurance.  Thanks.

## 2024-01-24 NOTE — Telephone Encounter (Signed)
 Copied from CRM [id number]. Topic: Clinical - Request for Lab/Test Order >> Jan 24, 2024  9:35 AM El Gravely T wrote: Reason for CRM: Patient calling states he received his labs via mail, but would like to know what his blood type is, and if a test can be ordered.  Patient is also requesting an appointment to receive a shingles injection.  Appointment not scheduled for shingles injection, as an order is required prior to scheduling, per office.  Patient can be reached at [phone] to discuss further and schedule appointment.   Thank you

## 2024-01-24 NOTE — Telephone Encounter (Signed)
 1st shingle dose scheduled for 6/13 advised that he can do his 2nd dose at his 3 month f/u appointment with dr. Sagardia

## 2024-01-27 ENCOUNTER — Other Ambulatory Visit: Payer: Self-pay | Admitting: Radiology

## 2024-01-27 NOTE — Telephone Encounter (Signed)
 Patient is going to call [facility]/ insurance to see how much it would be to have this done, and is too call back to confirm if he still wants lab test ordered.

## 2024-01-31 ENCOUNTER — Ambulatory Visit

## 2024-02-25 ENCOUNTER — Telehealth: Payer: Self-pay

## 2024-02-25 NOTE — Telephone Encounter (Signed)
 Copied from CRM [id number]. Topic: General - Other >> Feb 25, 2024 12:27 PM Martinique E wrote: Reason for CRM: Tessa from [employer] called stating that she needs a diagnosis confirmation for diabetes cardiovascular disease / CHF for the patient. Callback number [phone] ref # [id number].

## 2024-02-27 NOTE — Telephone Encounter (Signed)
 Spoke with [facility] staff and patient has been verified

## 2024-03-04 ENCOUNTER — Other Ambulatory Visit: Payer: Self-pay | Admitting: Emergency Medicine

## 2024-03-04 NOTE — Telephone Encounter (Unsigned)
 Copied from CRM [id number]. Topic: Clinical - Medication Refill >> Mar 04, 2024  1:26 PM Rosina D wrote: Medication: glipiZIDE  (GLUCOTROL  XL) 10 MG 24 hr tablet  Has the patient contacted their pharmacy? Yes (Agent: If no, request that the patient contact the pharmacy for the refill. If patient does not wish to contact the pharmacy document the reason why and proceed with request.) (Agent: If yes, when and what did the pharmacy advise?)  This is the patient's preferred pharmacy:  [id number] [place], [place] - [address] AT [facility] [address] [place] [address] Phone: [phone] Fax: [phone]  Is this the correct pharmacy for this prescription? Yes If no, delete pharmacy and type the correct one.   Has the prescription been filled recently? No  Is the patient out of the medication? Yes  Has the patient been seen for an appointment in the last year OR does the patient have an upcoming appointment? Yes  Can we respond through MyChart? No  Agent: Please be advised that Rx refills may take up to 3 business days. We ask that you follow-up with your pharmacy.

## 2024-04-13 ENCOUNTER — Ambulatory Visit (INDEPENDENT_AMBULATORY_CARE_PROVIDER_SITE_OTHER): Admitting: Emergency Medicine

## 2024-04-13 ENCOUNTER — Ambulatory Visit: Admitting: Emergency Medicine

## 2024-04-13 ENCOUNTER — Encounter: Payer: Self-pay | Admitting: Emergency Medicine

## 2024-04-13 VITALS — BP 124/80 | HR 68 | Temp 97.9°F | Ht 64.0 in | Wt 172.1 lb

## 2024-04-13 DIAGNOSIS — K4090 Unilateral inguinal hernia, without obstruction or gangrene, not specified as recurrent: Secondary | ICD-10-CM

## 2024-04-13 DIAGNOSIS — Z1211 Encounter for screening for malignant neoplasm of colon: Secondary | ICD-10-CM

## 2024-04-13 DIAGNOSIS — E1169 Type 2 diabetes mellitus with other specified complication: Secondary | ICD-10-CM | POA: Diagnosis not present

## 2024-04-13 DIAGNOSIS — D696 Thrombocytopenia, unspecified: Secondary | ICD-10-CM

## 2024-04-13 DIAGNOSIS — Z7984 Long term (current) use of oral hypoglycemic drugs: Secondary | ICD-10-CM

## 2024-04-13 DIAGNOSIS — Z7985 Long-term (current) use of injectable non-insulin antidiabetic drugs: Secondary | ICD-10-CM

## 2024-04-13 DIAGNOSIS — N529 Male erectile dysfunction, unspecified: Secondary | ICD-10-CM | POA: Diagnosis not present

## 2024-04-13 DIAGNOSIS — E785 Hyperlipidemia, unspecified: Secondary | ICD-10-CM

## 2024-04-13 LAB — POCT GLYCOSYLATED HEMOGLOBIN (HGB A1C)
HbA1c POC (<> result, manual entry): 7.5 % (ref 4.0–5.6)
Hemoglobin A1C: 7.5 % — AB (ref 4.0–5.6)

## 2024-04-13 NOTE — Progress Notes (Signed)
 David Strong 68 y.o.   No chief complaint on file.   HISTORY OF PRESENT ILLNESS: This is a 68 y.o. male here for 4-month follow-up of diabetes and dyslipidemia Not taking Trulicity  every week Taking glipizide  and Farxiga  daily Continues to take atorvastatin  daily No other complaints or medical concerns today.  HPI   Prior to Admission medications   Medication Sig Start Date End Date Taking? Authorizing Provider  acetaminophen  (TYLENOL ) 325 MG tablet Take 2 tablets (650 mg total) by mouth every 6 (six) hours as needed for mild pain, fever or headache (or Fever >/= 101). 12/02/21  Yes [name], MD  benzonatate  (TESSALON  PERLES) 100 MG capsule Take 1 capsule (100 mg total) by mouth 3 (three) times daily as needed for cough. 09/17/23  Yes [name], [name], MD  dapagliflozin  propanediol (FARXIGA ) 10 MG TABS tablet Take 1 tablet (10 mg total) by mouth daily. 07/15/23  Yes [name], [name], MD  Dulaglutide  (TRULICITY ) 3 MG/0.5ML SOAJ Inject 3 mg as directed once a week. 01/08/24  Yes [name], [name], MD  glipiZIDE  (GLUCOTROL  XL) 10 MG 24 hr tablet TAKE 1 TABLET BY MOUTH 2 TIMES DAILY WITH A MEAL. 03/04/24  Yes [name], [name], MD  glucose blood (ACCU-CHEK AVIVA PLUS) test strip Use to test blood glucose daily. 04/26/23  Yes [name], [name], MD  Insulin  Pen Needle (PEN NEEDLES 3/16) 31G X 5 MM MISC 14 Units by Does not apply route 2 (two) times daily with a meal. 12/02/21  Yes Drusilla, Sabas RAMAN, MD  Plecanatide  (TRULANCE ) 3 MG TABS Take 1 tablet (3 mg total) by mouth daily. 10/10/22  Yes [name], MD  polyethylene glycol (MIRALAX  / GLYCOLAX ) 17 g packet Take 17 g by mouth daily as needed. 12/02/21  Yes [name], MD  rosuvastatin  (CRESTOR ) 10 MG tablet TAKE 1 TABLET BY MOUTH EVERY DAY 04/22/23  Yes [name], [name], MD    Allergies  Allergen Reactions   Codeine Itching   Glipizide  Er [Glipizide ]     SOB, malaise. He tolerates XL without issues.   Metformin   And Related Rash    Patient Active Problem List   Diagnosis Date Noted   Hyperlipidemia associated with type 2 diabetes mellitus (HCC) 01/08/2023   Chronic idiopathic constipation 10/10/2022   Chronic pain of left knee 03/29/2022   Right inguinal hernia 10/12/2021   Type 2 diabetes mellitus (HCC) 06/07/2015   Thrombocytopenia (HCC) 08/03/2012    Past Medical History:  Diagnosis Date   Diabetes mellitus without complication (HCC)    Pneumonia    Sternoclavicular joint subluxation, left, initial encounter    In setting of joint abscess    Past Surgical History:  Procedure Laterality Date   APPLICATION OF WOUND VAC Left 12/07/2021   Procedure: WOUND VAC CHANGE;  Surgeon: Kerrin Elspeth BROCKS, MD;  Location: [facility] OR;  Service: Vascular;  Laterality: Left;   INCISION AND DRAINAGE OF WOUND Left 12/04/2021   Procedure: IRRIGATION AND DEBRIDMENT OF STERNOCLAVICULAR WOUND, WOUND VAC PLACMENT.;  Surgeon: Kerrin Elspeth BROCKS, MD;  Location: [facility] OR;  Service: Vascular;  Laterality: Left;   IRRIGATION AND DEBRIDEMENT ABSCESS Left 12/07/2021   Procedure: IRRIGATION AND DEBRIDEMENT ABSCESS;  Surgeon: Kerrin Elspeth BROCKS, MD;  Location: [facility] OR;  Service: Vascular;  Laterality: Left;   TEE WITHOUT CARDIOVERSION N/A 12/08/2021   Procedure: TRANSESOPHAGEAL ECHOCARDIOGRAM (TEE);  Surgeon: Santo Stanly LABOR, MD;  Location: [facility] ENDOSCOPY;  Service: Cardiovascular;  Laterality: N/A;    Social History  Socioeconomic History   Marital status: Married    Spouse name: David Strong   Number of children: 3   Years of education: college   Highest education level: Not on file  Occupational History   Occupation: Semi Retired  Tobacco Use   Smoking status: Never   Smokeless tobacco: Never  Vaping Use   Vaping status: Never Used  Substance and Sexual Activity   Alcohol use: Not Currently    Comment: occasional   Drug use: No   Sexual activity: Yes  Other Topics Concern   Not on file  Social History  Narrative   Son lives with patient and wife/2025   Social Drivers of Health   Financial Resource Strain: Low Risk  (01/08/2024)   Overall Financial Resource Strain (CARDIA)    Difficulty of Paying Living Expenses: Not very hard  Food Insecurity: No Food Insecurity (01/08/2024)   Hunger Vital Sign    Worried About Running Out of Food in the Last Year: Never true    Ran Out of Food in the Last Year: Never true  Transportation Needs: No Transportation Needs (01/08/2024)   PRAPARE - Administrator, Civil Service (Medical): No    Lack of Transportation (Non-Medical): No  Physical Activity: Unknown (01/08/2024)   Exercise Vital Sign    Days of Exercise per Week: Not on file    Minutes of Exercise per Session: 0 min  Stress: No Stress Concern Present (01/08/2024)   [employer] of Occupational Health - Occupational Stress Questionnaire    Feeling of Stress : Only a little  Social Connections: Moderately Integrated (01/08/2024)   Social Connection and Isolation Panel    Frequency of Communication with Friends and Family: Once a week    Frequency of Social Gatherings with Friends and Family: Once a week    Attends Religious Services: More than 4 times per year    Active Member of [employer] or Organizations: Yes    Attends Banker Meetings: Never    Marital Status: Married  Catering manager Violence: Not At Risk (01/08/2024)   Humiliation, Afraid, Rape, and Kick questionnaire    Fear of Current or Ex-Partner: No    Emotionally Abused: No    Physically Abused: No    Sexually Abused: No    Family History  Adopted: Yes     Review of Systems  Constitutional: Negative.  Negative for chills and fever.  HENT: Negative.  Negative for congestion and sore throat.   Respiratory: Negative.  Negative for cough and shortness of breath.   Cardiovascular: Negative.  Negative for chest pain and palpitations.  Gastrointestinal:  Negative for abdominal pain, nausea and vomiting.   Genitourinary: Negative.  Negative for dysuria and hematuria.  Skin: Negative.  Negative for rash.  Neurological: Negative.  Negative for dizziness and headaches.  All other systems reviewed and are negative.   Vitals:   04/13/24 0926  BP: 124/80  Pulse: 68  Temp: 97.9 F (36.6 C)  SpO2: 98%    Physical Exam Vitals reviewed.  Constitutional:      Appearance: Normal appearance.  HENT:     Head: Normocephalic.     Mouth/Throat:     Mouth: Mucous membranes are moist.     Pharynx: Oropharynx is clear.  Eyes:     Extraocular Movements: Extraocular movements intact.     Conjunctiva/sclera: Conjunctivae normal.     Pupils: Pupils are equal, round, and reactive to light.  Cardiovascular:     Rate and  Rhythm: Normal rate and regular rhythm.     Pulses: Normal pulses.     Heart sounds: Normal heart sounds.  Pulmonary:     Effort: Pulmonary effort is normal.     Breath sounds: Normal breath sounds.  Abdominal:     Palpations: Abdomen is soft.     Tenderness: There is no abdominal tenderness.  Musculoskeletal:     Cervical back: No tenderness.  Lymphadenopathy:     Cervical: No cervical adenopathy.  Skin:    General: Skin is warm and dry.     Capillary Refill: Capillary refill takes less than 2 seconds.  Neurological:     General: No focal deficit present.     Mental Status: He is alert and oriented to person, place, and time.  Psychiatric:        Mood and Affect: Mood normal.        Behavior: Behavior normal.    Results for orders placed or performed in visit on 04/13/24 (from the past 24 hours)  POCT glycosylated hemoglobin (Hb A1C)     Status: Abnormal   Collection Time: 04/13/24  9:45 AM  Result Value Ref Range   Hemoglobin A1C 7.5 (A) 4.0 - 5.6 %   HbA1c POC (<> result, manual entry) 7.5 4.0 - 5.6 %   HbA1c, POC (prediabetic range)     HbA1c, POC (controlled diabetic range)        ASSESSMENT & PLAN: A total of 40 minutes was spent with the patient and  counseling/coordination of care regarding preparing for this visit, review of most recent office visit notes, review of multiple chronic medical conditions and their management, cardiovascular risks associated with uncontrolled diabetes, diagnosis of erectile dysfunction and need for urology evaluation, review of all medications, review of most recent bloodwork results including interpretation of today's hemoglobin A1c, review of health maintenance items, education on nutrition, prognosis, documentation, and need for follow up.   Problem List Items Addressed This Visit       Endocrine   Hyperlipidemia associated with type 2 diabetes mellitus (HCC) - Primary   Hemoglobin A1c better than before 7.5.  Still not at goal. Recommend to continue daily Farxiga  10 mg and glipizide  10 mg twice a day Recommend to use Trulicity  weekly at recommended dose of 3 mg Continue rosuvastatin  10 mg daily Cardiovascular risks associated with uncontrolled diabetes discussed Diet and nutrition discussed Benefits of exercise discussed Recommend follow-up in 3 months Blood work results from 3 months ago reviewed with patient      Relevant Orders   POCT glycosylated hemoglobin (Hb A1C) (Completed)     Hematopoietic and Hemostatic   Thrombocytopenia (HCC)   Chronic problem without complications Last CBC results reviewed with patient Thrombocytopenia was felt to be secondary to ethnic origin No significant bleeding problems identified        Other   Right inguinal hernia   Still present and affecting quality of life Slightly tender to palpation Will place new referral to general surgeon for definitive care. Symptom management discussed.      Erectile dysfunction   Only partially responsive to Viagra  Advised to take Viagra  100 mg on an empty stomach Recommend urology evaluation at this point Referral placed today.      Relevant Orders   Ambulatory referral to Urology   Other Visit Diagnoses        Screening for colon cancer       Relevant Orders   Cologuard  Patient Instructions  Diabetes: Healthy Eating for Adults When you have diabetes, also called diabetes mellitus, it's important to have healthy eating habits. Your blood sugar (glucose) levels are greatly affected by what you eat and drink. You need to eat healthy foods in the right amounts, at about the same times each day. Doing this can help you: Manage your blood sugar. Lower your risk of heart disease. Improve your blood pressure. Reach or stay at a healthy weight. What can affect my meal plan? Every person with diabetes is different. And each person has different needs for a meal plan. Your health care provider may suggest that you work with an expert in healthy eating called a dietitian. They can help you make a meal plan that's best for you. How do carbohydrates affect me? Carbohydrates, also called carbs, affect your blood sugar level more than any other type of food. Eating carbs raises the amount of sugar in your blood. It's important to know how many carbs you can safely have in each meal. This is different for every person. Your dietitian can help you calculate how many carbs you should have at each meal and for each snack. How does alcohol affect me? Alcohol can cause a decrease in blood sugar (hypoglycemia), especially if you use insulin  or take certain diabetes medicines by mouth. Hypoglycemia can be a life-threatening condition. Symptoms of hypoglycemia are similar to those of having too much alcohol. They include confusion, being sleepy, and feeling dizzy. Do not drink alcohol if: Your provider tells you not to drink. You're pregnant, may be pregnant, or plan to become pregnant. What are tips for following this plan? Reading food labels Start by checking the serving size on the Nutrition Facts label of packaged foods and drinks. The number of calories and the amount of carbs, fats, and other nutrients  listed on the label are based on one serving of the item. Many items contain more than one serving per package. Check the total grams (g) of carbs in one serving. Check the number of grams of saturated fats and trans fats in one serving. Choose foods that have a low amount or none of these fats. Check the number of milligrams (mg) of salt (sodium) in one serving. Most people should limit their total sodium intake to less than 2,300 mg per day. Always check the nutrition information of foods labeled as low-fat or nonfat. These foods may be higher in added sugar or refined carbs and should be avoided. Talk to your dietitian to identify your daily goals for nutrients listed on the label. Shopping Avoid buying canned, pre-made, or processed foods. These foods tend to be high in fat, sodium, and added sugar. Shop around the outside edge of the grocery store. This is where you'll most often find fresh fruits and vegetables, bulk grains, fresh meats, and fresh dairy products. Cooking Use low-heat cooking methods, such as baking, instead of high-heat methods like deep frying. Cook using healthy oils, such as olive, canola, or sunflower oil. Avoid cooking with butter, cream, or high-fat meats. Meal planning  Eat meals and snacks regularly. Try to eat them at the same times every day. Avoid going too long without eating. Eat foods that are high in fiber, such as fresh fruits, vegetables, beans, and whole grains. Eat 4-6 oz (112-168 g) of lean protein each day, such as lean meat, chicken, fish, eggs, or tofu. One ounce (oz) (28 g) of lean protein is equal to: 1 oz (28 g) of meat,  chicken, or fish. 1 egg.  cup (62 g) of tofu. Eat some foods each day that contain healthy fats, such as avocado, nuts, seeds, and fish. What foods should I eat? Fruits Berries. Apples. Oranges. Peaches. Apricots. Plums. Grapes. Mangoes. Papayas. Pomegranates. Kiwi. Cherries. Vegetables Leafy greens, including lettuce,  spinach, kale, chard, collard greens, mustard greens, and cabbage. Beets. Cauliflower. Broccoli. Carrots. Green beans. Tomatoes. Peppers. Onions. Cucumbers. Brussels sprouts. Grains Whole grains, such as whole-wheat or whole-grain bread, crackers, tortillas, cereal, and pasta. Unsweetened oatmeal. Quinoa. Brown or wild rice. Meats and other proteins Seafood. Poultry without skin. Lean cuts of poultry and beef. Tofu. Nuts. Seeds. Dairy Low-fat or fat-free dairy products such as milk, yogurt, and cheese. The items listed above may not be all the foods and drinks you can have. Talk with a dietitian to learn more. What foods should I avoid? Fruits Fruits canned with syrup. Vegetables Canned vegetables. Frozen vegetables with butter or cream sauce. Grains Refined white flour and flour products such as bread, pasta, snack foods, and cereals. Avoid all processed foods. Meats and other proteins Fatty cuts of meat. Poultry with skin. Breaded or fried meats. Processed meat. Avoid saturated fats. Dairy Full-fat yogurt, cheese, or milk. Beverages Sweetened drinks, such as soda or iced tea. The items listed above may not be all the foods and drinks you should avoid. Talk with a dietitian to learn more. Where to find more information: To learn more, go to: Academy of Nutrition and Dietetics at DeathPrevention.it. Click Search and type diabetes. Find the link you need. Centers for Disease Control and Prevention at TonerPromos.no. Click Search and type diabetes. Find the link you need. American Diabetes Association: diabetes.org/food-nutrition [employer] of Diabetes and Digestive and Kidney Diseases: StageSync.si This information is not intended to replace advice given to you by your health care provider. Make sure you discuss any questions you have with your health care provider. Document Revised: 07/25/2023 Document Reviewed: 07/25/2023 Elsevier Patient Education  2025 Elsevier  Inc.     Emil Schaumann, MD [facility]

## 2024-04-13 NOTE — Assessment & Plan Note (Signed)
 Hemoglobin A1c better than before 7.5.  Still not at goal. Recommend to continue daily Farxiga  10 mg and glipizide  10 mg twice a day Recommend to use Trulicity  weekly at recommended dose of 3 mg Continue rosuvastatin  10 mg daily Cardiovascular risks associated with uncontrolled diabetes discussed Diet and nutrition discussed Benefits of exercise discussed Recommend follow-up in 3 months Blood work results from 3 months ago reviewed with patient

## 2024-04-13 NOTE — Assessment & Plan Note (Signed)
 Still present and affecting quality of life Slightly tender to palpation Will place new referral to general surgeon for definitive care. Symptom management discussed.

## 2024-04-13 NOTE — Assessment & Plan Note (Signed)
 Only partially responsive to Viagra  Advised to take Viagra  100 mg on an empty stomach Recommend urology evaluation at this point Referral placed today.

## 2024-04-13 NOTE — Patient Instructions (Signed)

## 2024-04-13 NOTE — Assessment & Plan Note (Signed)
 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

## 2024-05-01 DIAGNOSIS — Z1211 Encounter for screening for malignant neoplasm of colon: Secondary | ICD-10-CM | POA: Diagnosis not present

## 2024-05-06 LAB — COLOGUARD: COLOGUARD: NEGATIVE

## 2024-05-12 ENCOUNTER — Ambulatory Visit: Payer: Self-pay

## 2024-05-12 NOTE — Telephone Encounter (Signed)
 FYI Only or Action Required?: FYI only for provider.  Patient was last seen in primary care on 04/13/2024 by [place] [name], MD.  Called Nurse Triage reporting No chief complaint on file..  Symptoms began today.  Interventions attempted: Prescription medications: Trulicity  .  Symptoms are: stable.  Triage Disposition: Information or Advice Only Call  Patient/caregiver understands and will follow disposition?: YesCopied from CRM #[id number]. Topic: Clinical - Medication Question >> May 12, 2024  1:38 PM Robinson H wrote: Reason for CRM: Patient states he started taking the Dulaglutide  (TRULICITY ) 3 MG/0.5ML SOAJ yesterday he took his blood sugar and it was 248 due to him eating sweets the night before. Patient states he immediately took the Dulaglutide  (TRULICITY ) 3 MG/0.5ML SOAJ and his levels came down to 115 but he felt a little nauseous and wanted to inform provider.  [place] [phone] Reason for Disposition  Health information question, no triage required and triager able to answer question  Answer Assessment - Initial Assessment Questions 1. REASON FOR CALL: What is the main reason for your call? or How can I best help you? Sugar was 248 Monday after eating sweets Sunday. Today's sugar is 115. Pt wants PCP to know. Pt had no other concerns or questions at this time.  Protocols used: Information Only Call - No Triage-A-AH

## 2024-05-22 ENCOUNTER — Other Ambulatory Visit: Payer: Self-pay | Admitting: Emergency Medicine

## 2024-05-22 ENCOUNTER — Telehealth: Payer: Self-pay

## 2024-05-22 DIAGNOSIS — E1165 Type 2 diabetes mellitus with hyperglycemia: Secondary | ICD-10-CM

## 2024-05-22 MED ORDER — TRULICITY 1.5 MG/0.5ML ~~LOC~~ SOAJ: 1.5000 mg | SUBCUTANEOUS | 3 refills | Status: AC

## 2024-05-22 NOTE — Telephone Encounter (Signed)
 New prescription for Trulicity  1.5 mg sent to pharmacy of record today

## 2024-05-22 NOTE — Telephone Encounter (Signed)
 Copied from CRM [id number]. Topic: General - Other >> May 22, 2024  1:54 PM Rosina BIRCH wrote: Reason for CRM: patient called stating he has not received a call from the urology. Patient also stated he was put on trulicity  3mg  and when he took it for the first time and it made him want to throw up and he think it is too strong for him based on the previous dose he had [phone]

## 2024-05-25 NOTE — Telephone Encounter (Signed)
 Patient was informed of new rx sent in.

## 2024-06-12 ENCOUNTER — Telehealth: Payer: Self-pay

## 2024-06-12 NOTE — Telephone Encounter (Signed)
 Copied from CRM [id number]. Topic: General - Other >> Jun 12, 2024  1:43 PM David Strong wrote: Reason for CRM: Patient states he would like to talk to the PCP directly please. He states the issue is private and he does not wish to discuss with anyone else.  Patient callback is [phone]

## 2024-06-12 NOTE — Telephone Encounter (Signed)
 Explained to patient that provider typically do not take personal calls but can come in to be seen with him on a different date and I have scheduled this

## 2024-06-17 ENCOUNTER — Encounter: Payer: Self-pay | Admitting: Emergency Medicine

## 2024-06-17 ENCOUNTER — Ambulatory Visit: Admitting: Emergency Medicine

## 2024-06-17 VITALS — BP 118/74 | HR 73 | Temp 97.9°F | Ht 64.0 in | Wt 173.0 lb

## 2024-06-17 DIAGNOSIS — N529 Male erectile dysfunction, unspecified: Secondary | ICD-10-CM | POA: Diagnosis not present

## 2024-06-17 DIAGNOSIS — E1169 Type 2 diabetes mellitus with other specified complication: Secondary | ICD-10-CM | POA: Diagnosis not present

## 2024-06-17 DIAGNOSIS — E785 Hyperlipidemia, unspecified: Secondary | ICD-10-CM

## 2024-06-17 DIAGNOSIS — Z7984 Long term (current) use of oral hypoglycemic drugs: Secondary | ICD-10-CM

## 2024-06-17 NOTE — Assessment & Plan Note (Signed)
 Wt Readings from Last 3 Encounters:  06/17/24 173 lb (78.5 kg)  04/13/24 172 lb 2 oz (78.1 kg)  01/08/24 174 lb (78.9 kg)  Eating better and losing weight Diet and nutrition discussed

## 2024-06-17 NOTE — Patient Instructions (Signed)

## 2024-06-17 NOTE — Progress Notes (Signed)
 David Strong 67 y.o.   Chief Complaint  Patient presents with   Medical Management of Chronic Issues    Has some personal concerns     HISTORY OF PRESENT ILLNESS: This is a 68 y.o. male here for follow-up of multiple chronic medical conditions including diabetes and hypertension Overall doing well. Has questions about a testosterone booster supplementation product. Viagra  working well for him No other complaints or medical concerns today.  HPI   Prior to Admission medications   Medication Sig Start Date End Date Taking? Authorizing Provider  acetaminophen  (TYLENOL ) 325 MG tablet Take 2 tablets (650 mg total) by mouth every 6 (six) hours as needed for mild pain, fever or headache (or Fever >/= 101). 12/02/21  Yes [name], MD  benzonatate  (TESSALON  PERLES) 100 MG capsule Take 1 capsule (100 mg total) by mouth 3 (three) times daily as needed for cough. 09/17/23  Yes Vu, [name], MD  dapagliflozin  propanediol (FARXIGA ) 10 MG TABS tablet Take 1 tablet (10 mg total) by mouth daily. 07/15/23  Yes Iysha Mishkin, [name], MD  Dulaglutide  (TRULICITY ) 1.5 MG/0.5ML SOAJ Inject 1.5 mg into the skin once a week. 05/22/24  Yes Dyesha Henault, [name], MD  glipiZIDE  (GLUCOTROL  XL) 10 MG 24 hr tablet TAKE 1 TABLET BY MOUTH 2 TIMES DAILY WITH A MEAL. 03/04/24  Yes Shaquan Missey, [name], MD  glucose blood (ACCU-CHEK AVIVA PLUS) test strip Use to test blood glucose daily. 04/26/23  Yes Kasey Ewings Jose, MD  Insulin  Pen Needle (PEN NEEDLES 3/16) 31G X 5 MM MISC 14 Units by Does not apply route 2 (two) times daily with a meal. 12/02/21  Yes Drusilla, Sabas RAMAN, MD  Plecanatide  (TRULANCE ) 3 MG TABS Take 1 tablet (3 mg total) by mouth daily. 10/10/22  Yes Krystan Northrop Jose, MD  polyethylene glycol (MIRALAX  / GLYCOLAX ) 17 g packet Take 17 g by mouth daily as needed. 12/02/21  Yes [name], MD  rosuvastatin  (CRESTOR ) 10 MG tablet TAKE 1 TABLET BY MOUTH EVERY DAY 05/22/24  Yes Lafe Clerk, [name], MD     Allergies  Allergen Reactions   Codeine Itching   Glipizide  Er [Glipizide ]     SOB, malaise. He tolerates XL without issues.   Metformin  And Related Rash    Patient Active Problem List   Diagnosis Date Noted   Erectile dysfunction 04/13/2024   Hyperlipidemia associated with type 2 diabetes mellitus (HCC) 01/08/2023   Chronic idiopathic constipation 10/10/2022   Chronic pain of left knee 03/29/2022   Morbid (severe) obesity due to excess calories (HCC) 12/05/2021   Right inguinal hernia 10/12/2021   Type 2 diabetes mellitus (HCC) 06/07/2015   Thrombocytopenia 08/03/2012    Past Medical History:  Diagnosis Date   Diabetes mellitus without complication (HCC)    Pneumonia    Sternoclavicular joint subluxation, left, initial encounter    In setting of joint abscess    Past Surgical History:  Procedure Laterality Date   APPLICATION OF WOUND VAC Left 12/07/2021   Procedure: WOUND VAC CHANGE;  Surgeon: Kerrin Elspeth BROCKS, MD;  Location: [facility] OR;  Service: Vascular;  Laterality: Left;   INCISION AND DRAINAGE OF WOUND Left 12/04/2021   Procedure: IRRIGATION AND DEBRIDMENT OF STERNOCLAVICULAR WOUND, WOUND VAC PLACMENT.;  Surgeon: Kerrin Elspeth BROCKS, MD;  Location: [facility] OR;  Service: Vascular;  Laterality: Left;   IRRIGATION AND DEBRIDEMENT ABSCESS Left 12/07/2021   Procedure: IRRIGATION AND DEBRIDEMENT ABSCESS;  Surgeon: Kerrin Elspeth BROCKS, MD;  Location: [facility] OR;  Service:  Vascular;  Laterality: Left;   TEE WITHOUT CARDIOVERSION N/A 12/08/2021   Procedure: TRANSESOPHAGEAL ECHOCARDIOGRAM (TEE);  Surgeon: Santo Stanly LABOR, MD;  Location: [facility] ENDOSCOPY;  Service: Cardiovascular;  Laterality: N/A;    Social History   Socioeconomic History   Marital status: Married    Spouse name: Altamese   Number of children: 3   Years of education: college   Highest education level: Not on file  Occupational History   Occupation: Semi Retired  Tobacco Use   Smoking status: Never    Smokeless tobacco: Never  Vaping Use   Vaping status: Never Used  Substance and Sexual Activity   Alcohol use: Not Currently    Comment: occasional   Drug use: No   Sexual activity: Yes  Other Topics Concern   Not on file  Social History Narrative   Son lives with patient and wife/2025   Social Drivers of Health   Financial Resource Strain: Low Risk  (01/08/2024)   Overall Financial Resource Strain (CARDIA)    Difficulty of Paying Living Expenses: Not very hard  Food Insecurity: No Food Insecurity (01/08/2024)   Hunger Vital Sign    Worried About Running Out of Food in the Last Year: Never true    Ran Out of Food in the Last Year: Never true  Transportation Needs: No Transportation Needs (01/08/2024)   PRAPARE - Administrator, Civil Service (Medical): No    Lack of Transportation (Non-Medical): No  Physical Activity: Unknown (01/08/2024)   Exercise Vital Sign    Days of Exercise per Week: Not on file    Minutes of Exercise per Session: 0 min  Stress: No Stress Concern Present (01/08/2024)   [employer] of Occupational Health - Occupational Stress Questionnaire    Feeling of Stress : Only a little  Social Connections: Moderately Integrated (01/08/2024)   Social Connection and Isolation Panel    Frequency of Communication with Friends and Family: Once a week    Frequency of Social Gatherings with Friends and Family: Once a week    Attends Religious Services: More than 4 times per year    Active Member of [employer] or Organizations: Yes    Attends Banker Meetings: Never    Marital Status: Married  Catering Manager Violence: Not At Risk (01/08/2024)   Humiliation, Afraid, Rape, and Kick questionnaire    Fear of Current or Ex-Partner: No    Emotionally Abused: No    Physically Abused: No    Sexually Abused: No    Family History  Adopted: Yes     Review of Systems  Constitutional: Negative.  Negative for chills and fever.  HENT: Negative.   Negative for congestion and sore throat.   Respiratory: Negative.  Negative for cough and shortness of breath.   Cardiovascular: Negative.  Negative for chest pain and palpitations.  Gastrointestinal:  Negative for abdominal pain, diarrhea, nausea and vomiting.  Genitourinary: Negative.  Negative for dysuria and hematuria.  Skin: Negative.  Negative for rash.  Neurological: Negative.  Negative for dizziness and headaches.  All other systems reviewed and are negative.   Vitals:   06/17/24 0917  BP: 118/74  Pulse: 73  Temp: 97.9 F (36.6 C)  SpO2: 98%    Physical Exam Vitals reviewed.  Constitutional:      Appearance: Normal appearance.  HENT:     Head: Normocephalic.     Mouth/Throat:     Mouth: Mucous membranes are moist.     Pharynx: Oropharynx  is clear.  Eyes:     Extraocular Movements: Extraocular movements intact.     Pupils: Pupils are equal, round, and reactive to light.  Cardiovascular:     Rate and Rhythm: Normal rate and regular rhythm.     Pulses: Normal pulses.     Heart sounds: Normal heart sounds.  Pulmonary:     Effort: Pulmonary effort is normal.     Breath sounds: Normal breath sounds.  Abdominal:     Palpations: Abdomen is soft.     Tenderness: There is no abdominal tenderness.  Musculoskeletal:     Cervical back: No tenderness.  Lymphadenopathy:     Cervical: No cervical adenopathy.  Skin:    General: Skin is warm and dry.     Capillary Refill: Capillary refill takes less than 2 seconds.  Neurological:     General: No focal deficit present.     Mental Status: He is alert and oriented to person, place, and time.  Psychiatric:        Mood and Affect: Mood normal.        Behavior: Behavior normal.      ASSESSMENT & PLAN: Problem List Items Addressed This Visit       Endocrine   Hyperlipidemia associated with type 2 diabetes mellitus (HCC) - Primary   Hemoglobin A1c better than before 7.5.  Still not at goal. Recommend to continue daily  Farxiga  10 mg and glipizide  10 mg twice a day Recommend to use Trulicity  weekly at recommended dose of 3 mg Continue rosuvastatin  10 mg daily Cardiovascular risks associated with uncontrolled diabetes discussed Diet and nutrition discussed Benefits of exercise discussed Recommend follow-up in 3 months Blood work results from 3 months ago reviewed with patient        Other   Morbid (severe) obesity due to excess calories (HCC)   Wt Readings from Last 3 Encounters:  06/17/24 173 lb (78.5 kg)  04/13/24 172 lb 2 oz (78.1 kg)  01/08/24 174 lb (78.9 kg)  Eating better and losing weight Diet and nutrition discussed       Erectile dysfunction   Responding better to Viagra  on empty stomach Interested in trying over-the-counter testosterone booster.  Okay to do so.      Patient Instructions  Diabetes: Carbohydrate Counting for Adults Carbohydrate counting is a method of keeping track of how many carbohydrates you eat. Eating carbohydrates increases the amount of sugar, also called glucose, in your blood. By counting how many carbohydrates you eat, you can improve how well you manage your blood sugar. This, in turn, helps you manage your diabetes. Carbohydrates are measured in grams (g) per serving. It's important to know how many carbohydrates (in grams or by serving size) you can have in each meal. This is different for every person. A dietitian can help you make a meal plan and calculate how many carbohydrates you should have at each meal and snack. What foods contain carbohydrates? Carbohydrates are found in these foods: Grains, such as breads and cereals. Dried beans and soy products. Starchy vegetables, such as potatoes, peas, and corn. Fruit and fruit juices. Milk and yogurt. Sweets and snack foods like cake, cookies, candy, chips, and soft drinks. How do I count carbohydrates in foods? There are two ways to count carbohydrates in food. You can read food labels or learn standard  serving sizes of foods. You can use either of these methods or a combination of both. Using the Nutrition Facts label The Nutrition Facts list  is included on the labels of almost all packaged foods and drinks in the [place] It includes: The serving size. Information about nutrients in each serving. This includes the grams of carbohydrate per serving. To use the Nutrition Facts, decide how many servings you will have. Then, multiply the number of servings by the number of carbohydrates per serving. The resulting number is the total grams of carbohydrates that you'll be having. Learning the standard serving sizes of foods When you eat carbohydrate foods that aren't packaged or don't include Nutrition Facts on the label, you need to measure the servings in order to count the grams of carbohydrates. Measure the foods that you'll eat with a food scale or measuring cup, if needed. Decide how many standard-size servings you'll eat. Multiply the number of servings by 15. For foods that contain carbohydrates, one serving equals 15 g of carbohydrates. For example, if you eat 2 cups or 10 oz (300 g) of strawberries, you'll have eaten 2 servings and 30 g of carbohydrates (2 servings x 15 g = 30 g). For foods that have more than one food mixed, such as soups and casseroles, you must count the carbohydrates in each food that's included. Here's a list of standard serving sizes for common carbohydrate-rich foods. Each of these servings has about 15 g of carbohydrates: 1 slice of bread. 1 six-inch (15 cm) tortilla. ? cup or 2 oz (53 g) of cooked rice or pasta.  cup or 3 oz (85 g) of cooked or canned, drained, and rinsed beans or lentils.  cup or 3 oz (85 g) of a starchy vegetable, such as peas, corn, or squash.  cup or 4 oz (120 g) of hot cereal.  cup or 3 oz (85 g) of boiled or mashed potatoes, or  or 3 oz (85 g) of a large baked potato.  cup or 4 fl oz (118 mL) of fruit juice. 1 cup or 8 fl oz (237 mL) of  milk. 1 small or 4 oz (106 g) apple.  or 2 oz (63 g) of a medium banana. 1 cup or 5 oz (150 g) of strawberries. 3 cups or 1 oz (28.3 g) of popped popcorn. What is an example of carbohydrate counting? To calculate the grams of carbohydrates in this sample meal, follow the steps below. Sample meal 3 oz (85 g) chicken breast. ? cup or 4 oz (106 g) of brown rice.  cup or 3 oz (85 g) of corn. 1 cup or 8 fl oz (237 mL) of milk. 1 cup or 5 oz (150 g) of strawberries with sugar-free whipped topping. Carbohydrate calculation Identify the foods that have carbohydrates: Rice. Corn. Milk. Strawberries. Calculate how many servings you have of each food: 2 servings of rice. 1 serving of corn. 1 serving of milk. 1 serving of strawberries. Multiply each number of servings by 15 g: 2 servings of rice x 15 g = 30 g. 1 serving of corn x 15 g = 15 g. 1 serving of milk x 15 g = 15 g. 1 serving of strawberries x 15 g = 15 g. Add together all of the amounts to find the total grams of carbohydrates eaten: 30 g + 15 g + 15 g + 15 g = 75 g of carbohydrates total. Where to find more information To learn more, go to: American Diabetes Association at diabetes.org. Click Search and type carb counting. Find the link you need. Centers for Disease Control and Prevention at tonerpromos.no. Click Search and type  diabetes. Find the link you need. Academy of Nutrition and Dietetics: eatright.org This information is not intended to replace advice given to you by your health care provider. Make sure you discuss any questions you have with your health care provider. Document Revised: 07/24/2023 Document Reviewed: 07/24/2023 Elsevier Patient Education  2025 Elsevier Inc.       Emil Schaumann, MD [facility]

## 2024-06-17 NOTE — Assessment & Plan Note (Signed)
 Responding better to Viagra  on empty stomach Interested in trying over-the-counter testosterone booster.  Okay to do so.

## 2024-06-17 NOTE — Assessment & Plan Note (Signed)
 Hemoglobin A1c better than before 7.5.  Still not at goal. Recommend to continue daily Farxiga  10 mg and glipizide  10 mg twice a day Recommend to use Trulicity  weekly at recommended dose of 3 mg Continue rosuvastatin  10 mg daily Cardiovascular risks associated with uncontrolled diabetes discussed Diet and nutrition discussed Benefits of exercise discussed Recommend follow-up in 3 months Blood work results from 3 months ago reviewed with patient

## 2024-06-26 ENCOUNTER — Other Ambulatory Visit: Payer: Self-pay | Admitting: Emergency Medicine

## 2024-06-26 DIAGNOSIS — E1165 Type 2 diabetes mellitus with hyperglycemia: Secondary | ICD-10-CM

## 2024-06-26 NOTE — Telephone Encounter (Signed)
 Copied from CRM #[id number]. Topic: Clinical - Medication Refill >> Jun 26, 2024  1:18 PM Donna E wrote: Medication: glucose blood (ACCU-CHEK AVIVA PLUS) test strip  Has the patient contacted their pharmacy? Yes Pharmacy stated to call provider    This is the patient's preferred pharmacy:  [id number] [place], [place] - [address] AT [facility] [address] [place] [address] Phone: [phone] Fax: [phone]  Is this the correct pharmacy for this prescription? Yes If no, delete pharmacy and type the correct one.   Has the prescription been filled recently? Yes  Is the patient out of the medication? Yes  Has the patient been seen for an appointment in the last year OR does the patient have an upcoming appointment? Yes  Can we respond through MyChart? No  Agent: Please be advised that Rx refills may take up to 3 business days. We ask that you follow-up with your pharmacy.

## 2024-06-30 MED ORDER — ACCU-CHEK AVIVA PLUS VI STRP: ORAL_STRIP | 3 refills | Status: AC

## 2024-07-15 ENCOUNTER — Encounter: Payer: Self-pay | Admitting: Emergency Medicine

## 2024-07-15 ENCOUNTER — Ambulatory Visit: Admitting: Emergency Medicine

## 2024-07-27 ENCOUNTER — Other Ambulatory Visit: Payer: Self-pay | Admitting: Emergency Medicine

## 2024-09-17 ENCOUNTER — Ambulatory Visit: Admitting: Emergency Medicine

## 2024-09-17 ENCOUNTER — Encounter: Payer: Self-pay | Admitting: Emergency Medicine

## 2024-09-17 VITALS — BP 124/80 | HR 68 | Temp 98.2°F | Ht 64.0 in | Wt 176.0 lb

## 2024-09-17 DIAGNOSIS — Z7984 Long term (current) use of oral hypoglycemic drugs: Secondary | ICD-10-CM | POA: Diagnosis not present

## 2024-09-17 DIAGNOSIS — E785 Hyperlipidemia, unspecified: Secondary | ICD-10-CM

## 2024-09-17 DIAGNOSIS — E1169 Type 2 diabetes mellitus with other specified complication: Secondary | ICD-10-CM

## 2024-09-17 LAB — POCT GLYCOSYLATED HEMOGLOBIN (HGB A1C): HbA1c POC (<> result, manual entry): 7.9 %

## 2024-09-17 NOTE — Progress Notes (Signed)
 David Strong 69 y.o.   Chief Complaint  Patient presents with   Follow-up    HISTORY OF PRESENT ILLNESS: This is a 69 y.o. male here for 54-month follow-up of chronic medical conditions including diabetes and dyslipidemia Overall doing well.  Has no complaints or medical concerns today. Lab Results  Component Value Date   HGBA1C 7.9 09/17/2024     HPI   Prior to Admission medications  Medication Sig Start Date End Date Taking? Authorizing Provider  acetaminophen  (TYLENOL ) 325 MG tablet Take 2 tablets (650 mg total) by mouth every 6 (six) hours as needed for mild pain, fever or headache (or Fever >/= 101). 12/02/21  Yes [name], MD  benzonatate  (TESSALON  PERLES) 100 MG capsule Take 1 capsule (100 mg total) by mouth 3 (three) times daily as needed for cough. 09/17/23  Yes Vu, Trung T, MD  Dulaglutide  (TRULICITY ) 1.5 MG/0.5ML SOAJ Inject 1.5 mg into the skin once a week. 05/22/24  Yes Xzavior Reinig, [name], MD  FARXIGA  10 MG TABS tablet TAKE 1 TABLET BY MOUTH EVERY DAY 07/27/24  Yes Rylei Codispoti, [name], MD  glipiZIDE  (GLUCOTROL  XL) 10 MG 24 hr tablet TAKE 1 TABLET BY MOUTH 2 TIMES DAILY WITH A MEAL. 03/04/24  Yes Rhoderick Farrel, [name], MD  glucose blood (ACCU-CHEK AVIVA PLUS) test strip Use to test blood glucose daily. 06/30/24  Yes Purcell [name], MD  Insulin  Pen Needle (PEN NEEDLES 3/16) 31G X 5 MM MISC 14 Units by Does not apply route 2 (two) times daily with a meal. 12/02/21  Yes Drusilla, Sabas RAMAN, MD  Plecanatide  (TRULANCE ) 3 MG TABS Take 1 tablet (3 mg total) by mouth daily. 10/10/22  Yes Patrcia Schnepp Jose, MD  polyethylene glycol (MIRALAX  / GLYCOLAX ) 17 g packet Take 17 g by mouth daily as needed. 12/02/21  Yes [name], MD  rosuvastatin  (CRESTOR ) 10 MG tablet TAKE 1 TABLET BY MOUTH EVERY DAY 05/22/24  Yes Purcell [name], MD    Allergies[1]  Patient Active Problem List   Diagnosis Date Noted   Erectile dysfunction 04/13/2024   Hyperlipidemia associated  with type 2 diabetes mellitus (HCC) 01/08/2023   Chronic idiopathic constipation 10/10/2022   Chronic pain of left knee 03/29/2022   Morbid (severe) obesity due to excess calories (HCC) 12/05/2021   Right inguinal hernia 10/12/2021   Type 2 diabetes mellitus (HCC) 06/07/2015   Thrombocytopenia 08/03/2012    Past Medical History:  Diagnosis Date   Diabetes mellitus without complication (HCC)    Pneumonia    Sternoclavicular joint subluxation, left, initial encounter    In setting of joint abscess    Past Surgical History:  Procedure Laterality Date   APPLICATION OF WOUND VAC Left 12/07/2021   Procedure: WOUND VAC CHANGE;  Surgeon: Kerrin Elspeth BROCKS, MD;  Location: [facility] OR;  Service: Vascular;  Laterality: Left;   INCISION AND DRAINAGE OF WOUND Left 12/04/2021   Procedure: IRRIGATION AND DEBRIDMENT OF STERNOCLAVICULAR WOUND, WOUND VAC PLACMENT.;  Surgeon: Kerrin Elspeth BROCKS, MD;  Location: [facility] OR;  Service: Vascular;  Laterality: Left;   IRRIGATION AND DEBRIDEMENT ABSCESS Left 12/07/2021   Procedure: IRRIGATION AND DEBRIDEMENT ABSCESS;  Surgeon: Kerrin Elspeth BROCKS, MD;  Location: [facility] OR;  Service: Vascular;  Laterality: Left;   TEE WITHOUT CARDIOVERSION N/A 12/08/2021   Procedure: TRANSESOPHAGEAL ECHOCARDIOGRAM (TEE);  Surgeon: Santo Stanly LABOR, MD;  Location: [facility] ENDOSCOPY;  Service: Cardiovascular;  Laterality: N/A;    Social History   Socioeconomic History   Marital status:  Married    Spouse name: Altamese   Number of children: 3   Years of education: college   Highest education level: Not on file  Occupational History   Occupation: Semi Retired  Tobacco Use   Smoking status: Never   Smokeless tobacco: Never  Vaping Use   Vaping status: Never Used  Substance and Sexual Activity   Alcohol use: Not Currently    Comment: occasional   Drug use: No   Sexual activity: Yes  Other Topics Concern   Not on file  Social History Narrative   Son lives with patient and  wife/2025   Social Drivers of Health   Tobacco Use: Low Risk (09/17/2024)   Patient History    Smoking Tobacco Use: Never    Smokeless Tobacco Use: Never    Passive Exposure: Not on file  Financial Resource Strain: Low Risk (01/08/2024)   Overall Financial Resource Strain (CARDIA)    Difficulty of Paying Living Expenses: Not very hard  Food Insecurity: No Food Insecurity (01/08/2024)   Hunger Vital Sign    Worried About Running Out of Food in the Last Year: Never true    Ran Out of Food in the Last Year: Never true  Transportation Needs: No Transportation Needs (01/08/2024)   PRAPARE - Administrator, Civil Service (Medical): No    Lack of Transportation (Non-Medical): No  Physical Activity: Unknown (01/08/2024)   Exercise Vital Sign    Days of Exercise per Week: Not on file    Minutes of Exercise per Session: 0 min  Stress: No Stress Concern Present (01/08/2024)   [employer] of Occupational Health - Occupational Stress Questionnaire    Feeling of Stress : Only a little  Social Connections: Moderately Integrated (01/08/2024)   Social Connection and Isolation Panel    Frequency of Communication with Friends and Family: Once a week    Frequency of Social Gatherings with Friends and Family: Once a week    Attends Religious Services: More than 4 times per year    Active Member of [employer] or Organizations: Yes    Attends Banker Meetings: Never    Marital Status: Married  Catering Manager Violence: Not At Risk (01/08/2024)   Humiliation, Afraid, Rape, and Kick questionnaire    Fear of Current or Ex-Partner: No    Emotionally Abused: No    Physically Abused: No    Sexually Abused: No  Depression (PHQ2-9): Low Risk (09/17/2024)   Depression (PHQ2-9)    PHQ-2 Score: 0  Alcohol Screen: Low Risk (01/08/2024)   Alcohol Screen    Last Alcohol Screening Score (AUDIT): 0  Housing: Unknown (01/08/2024)   Housing Stability Vital Sign    Unable to Pay for Housing in  the Last Year: No    Number of Times Moved in the Last Year: Not on file    Homeless in the Last Year: No  Utilities: Not At Risk (01/08/2024)   [facility] Utilities    Threatened with loss of utilities: No  Health Literacy: Not on file    Family History  Adopted: Yes     Review of Systems  Constitutional: Negative.  Negative for chills and fever.  HENT: Negative.  Negative for congestion and sore throat.   Respiratory: Negative.  Negative for cough and shortness of breath.   Cardiovascular: Negative.  Negative for chest pain and palpitations.  Gastrointestinal:  Negative for abdominal pain, diarrhea, nausea and vomiting.  Genitourinary: Negative.  Negative for dysuria and hematuria.  Skin: Negative.  Negative for rash.  Neurological: Negative.  Negative for dizziness and headaches.  All other systems reviewed and are negative.   Vitals:   09/17/24 0923  BP: 124/80  Pulse: 68  Temp: 98.2 F (36.8 C)  SpO2: 97%    Physical Exam Vitals reviewed.  Constitutional:      Appearance: Normal appearance.  HENT:     Head: Normocephalic.     Mouth/Throat:     Mouth: Mucous membranes are moist.     Pharynx: Oropharynx is clear.  Eyes:     Extraocular Movements: Extraocular movements intact.     Pupils: Pupils are equal, round, and reactive to light.  Cardiovascular:     Rate and Rhythm: Normal rate and regular rhythm.     Pulses: Normal pulses.     Heart sounds: Normal heart sounds.  Pulmonary:     Effort: Pulmonary effort is normal.     Breath sounds: Normal breath sounds.  Musculoskeletal:     Cervical back: No tenderness.  Lymphadenopathy:     Cervical: No cervical adenopathy.  Skin:    General: Skin is warm and dry.     Capillary Refill: Capillary refill takes less than 2 seconds.  Neurological:     General: No focal deficit present.     Mental Status: He is alert and oriented to person, place, and time.  Psychiatric:        Mood and Affect: Mood normal.         Behavior: Behavior normal.    Results for orders placed or performed in visit on 09/17/24 (from the past 24 hours)  POCT HgB A1C     Status: Abnormal   Collection Time: 09/17/24  9:38 AM  Result Value Ref Range   Hemoglobin A1C     HbA1c POC (<> result, manual entry) 7.9 4.0 - 5.6 %   HbA1c, POC (prediabetic range)     HbA1c, POC (controlled diabetic range)       ASSESSMENT & PLAN: Problem List Items Addressed This Visit       Endocrine   Hyperlipidemia associated with type 2 diabetes mellitus (HCC) - Primary   Hemoglobin A1c better than before 7.9.  Still not at goal. Recommend to continue daily Farxiga  10 mg and glipizide  10 mg twice a day Recommend to use Trulicity  weekly at recommended dose of 3 mg Continue rosuvastatin  10 mg daily Cardiovascular risks associated with uncontrolled diabetes discussed Diet and nutrition discussed Benefits of exercise discussed Recommend follow-up in 3 months Blood work results from 3 months ago reviewed with patient      Relevant Orders   POCT HgB A1C (Completed)   Patient Instructions  Diabetes: Carbohydrate Counting for Adults Carbohydrate counting is a method of keeping track of how many carbohydrates you eat. Eating carbohydrates increases the amount of sugar, also called glucose, in your blood. By counting how many carbohydrates you eat, you can improve how well you manage your blood sugar. This, in turn, helps you manage your diabetes. Carbohydrates are measured in grams (g) per serving. It's important to know how many carbohydrates (in grams or by serving size) you can have in each meal. This is different for every person. A dietitian can help you make a meal plan and calculate how many carbohydrates you should have at each meal and snack. What foods contain carbohydrates? Carbohydrates are found in these foods: Grains, such as breads and cereals. Dried beans and soy products. Starchy vegetables, such as potatoes,  peas, and  corn. Fruit and fruit juices. Milk and yogurt. Sweets and snack foods like cake, cookies, candy, chips, and soft drinks. How do I count carbohydrates in foods? There are two ways to count carbohydrates in food. You can read food labels or learn standard serving sizes of foods. You can use either of these methods or a combination of both. Using the Nutrition Facts label The Nutrition Facts list is included on the labels of almost all packaged foods and drinks in the [place] It includes: The serving size. Information about nutrients in each serving. This includes the grams of carbohydrate per serving. To use the Nutrition Facts, decide how many servings you will have. Then, multiply the number of servings by the number of carbohydrates per serving. The resulting number is the total grams of carbohydrates that you'll be having. Learning the standard serving sizes of foods When you eat carbohydrate foods that aren't packaged or don't include Nutrition Facts on the label, you need to measure the servings in order to count the grams of carbohydrates. Measure the foods that you'll eat with a food scale or measuring cup, if needed. Decide how many standard-size servings you'll eat. Multiply the number of servings by 15. For foods that contain carbohydrates, one serving equals 15 g of carbohydrates. For example, if you eat 2 cups or 10 oz (300 g) of strawberries, you'll have eaten 2 servings and 30 g of carbohydrates (2 servings x 15 g = 30 g). For foods that have more than one food mixed, such as soups and casseroles, you must count the carbohydrates in each food that's included. Here's a list of standard serving sizes for common carbohydrate-rich foods. Each of these servings has about 15 g of carbohydrates: 1 slice of bread. 1 six-inch (15 cm) tortilla. ? cup or 2 oz (53 g) of cooked rice or pasta.  cup or 3 oz (85 g) of cooked or canned, drained, and rinsed beans or lentils.  cup or 3 oz (85 g) of a  starchy vegetable, such as peas, corn, or squash.  cup or 4 oz (120 g) of hot cereal.  cup or 3 oz (85 g) of boiled or mashed potatoes, or  or 3 oz (85 g) of a large baked potato.  cup or 4 fl oz (118 mL) of fruit juice. 1 cup or 8 fl oz (237 mL) of milk. 1 small or 4 oz (106 g) apple.  or 2 oz (63 g) of a medium banana. 1 cup or 5 oz (150 g) of strawberries. 3 cups or 1 oz (28.3 g) of popped popcorn. What is an example of carbohydrate counting? To calculate the grams of carbohydrates in this sample meal, follow the steps below. Sample meal 3 oz (85 g) chicken breast. ? cup or 4 oz (106 g) of brown rice.  cup or 3 oz (85 g) of corn. 1 cup or 8 fl oz (237 mL) of milk. 1 cup or 5 oz (150 g) of strawberries with sugar-free whipped topping. Carbohydrate calculation Identify the foods that have carbohydrates: Rice. Corn. Milk. Strawberries. Calculate how many servings you have of each food: 2 servings of rice. 1 serving of corn. 1 serving of milk. 1 serving of strawberries. Multiply each number of servings by 15 g: 2 servings of rice x 15 g = 30 g. 1 serving of corn x 15 g = 15 g. 1 serving of milk x 15 g = 15 g. 1 serving of strawberries x 15 g =  15 g. Add together all of the amounts to find the total grams of carbohydrates eaten: 30 g + 15 g + 15 g + 15 g = 75 g of carbohydrates total. Where to find more information To learn more, go to: American Diabetes Association at diabetes.org. Click Search and type carb counting. Find the link you need. Centers for Disease Control and Prevention at tonerpromos.no. Click Search and type diabetes. Find the link you need. Academy of Nutrition and Dietetics: eatright.org This information is not intended to replace advice given to you by your health care provider. Make sure you discuss any questions you have with your health care provider. Document Revised: 07/24/2023 Document Reviewed: 07/24/2023 Elsevier Patient Education  2025  Elsevier Inc.     Emil Schaumann, MD [facility]    [1]  Allergies Allergen Reactions   Codeine Itching   Glipizide  Er [Glipizide ]     SOB, malaise. He tolerates XL without issues.   Metformin  And Related Rash

## 2024-09-17 NOTE — Assessment & Plan Note (Signed)
 Hemoglobin A1c better than before 7.9.  Still not at goal. Recommend to continue daily Farxiga  10 mg and glipizide  10 mg twice a day Recommend to use Trulicity  weekly at recommended dose of 3 mg Continue rosuvastatin  10 mg daily Cardiovascular risks associated with uncontrolled diabetes discussed Diet and nutrition discussed Benefits of exercise discussed Recommend follow-up in 3 months Blood work results from 3 months ago reviewed with patient

## 2024-09-17 NOTE — Patient Instructions (Signed)

## 2025-01-08 ENCOUNTER — Ambulatory Visit

## 2025-03-17 ENCOUNTER — Ambulatory Visit: Admitting: Emergency Medicine
